# Patient Record
Sex: Male | Born: 1945
Health system: Southern US, Community
[De-identification: ages and names within clinical notes are randomized; demographics above are authoritative.]

## PROBLEM LIST (undated history)

## (undated) DIAGNOSIS — K219 Gastro-esophageal reflux disease without esophagitis: Secondary | ICD-10-CM

## (undated) DIAGNOSIS — E7800 Pure hypercholesterolemia, unspecified: Secondary | ICD-10-CM

## (undated) DIAGNOSIS — C61 Malignant neoplasm of prostate: Secondary | ICD-10-CM

## (undated) DIAGNOSIS — Z720 Tobacco use: Secondary | ICD-10-CM

## (undated) DIAGNOSIS — Z923 Personal history of irradiation: Secondary | ICD-10-CM

## (undated) DIAGNOSIS — L570 Actinic keratosis: Secondary | ICD-10-CM

## (undated) DIAGNOSIS — N402 Nodular prostate without lower urinary tract symptoms: Secondary | ICD-10-CM

## (undated) DIAGNOSIS — M545 Low back pain, unspecified: Secondary | ICD-10-CM

## (undated) DIAGNOSIS — H9190 Unspecified hearing loss, unspecified ear: Secondary | ICD-10-CM

## (undated) DIAGNOSIS — C439 Malignant melanoma of skin, unspecified: Secondary | ICD-10-CM

## (undated) DIAGNOSIS — R972 Elevated prostate specific antigen [PSA]: Secondary | ICD-10-CM

## (undated) DIAGNOSIS — N400 Enlarged prostate without lower urinary tract symptoms: Secondary | ICD-10-CM

## (undated) DIAGNOSIS — E785 Hyperlipidemia, unspecified: Secondary | ICD-10-CM

## (undated) DIAGNOSIS — C4490 Unspecified malignant neoplasm of skin, unspecified: Secondary | ICD-10-CM

## (undated) DIAGNOSIS — IMO0001 Reserved for inherently not codable concepts without codable children: Secondary | ICD-10-CM

## (undated) DIAGNOSIS — G8929 Other chronic pain: Secondary | ICD-10-CM

## (undated) DIAGNOSIS — M1990 Unspecified osteoarthritis, unspecified site: Secondary | ICD-10-CM

## (undated) DIAGNOSIS — H9319 Tinnitus, unspecified ear: Secondary | ICD-10-CM

## (undated) HISTORY — DX: Unspecified malignant neoplasm of skin, unspecified: C44.90

## (undated) HISTORY — DX: Other chronic pain: G89.29

## (undated) HISTORY — PX: SHOULDER OPEN ROTATOR CUFF REPAIR: SHX2407

## (undated) HISTORY — DX: Malignant melanoma of skin, unspecified: C43.9

## (undated) HISTORY — DX: Pure hypercholesterolemia, unspecified: E78.00

## (undated) HISTORY — DX: Tobacco use: Z72.0

## (undated) HISTORY — DX: Personal history of irradiation: Z92.3

## (undated) HISTORY — PX: TONSILLECTOMY: SUR1361

## (undated) HISTORY — PX: APPENDECTOMY: SHX54

## (undated) HISTORY — DX: Unspecified osteoarthritis, unspecified site: M19.90

## (undated) HISTORY — DX: Elevated prostate specific antigen (PSA): R97.20

## (undated) HISTORY — DX: Low back pain, unspecified: M54.50

## (undated) HISTORY — DX: Low back pain: M54.5

## (undated) HISTORY — DX: Tinnitus, unspecified ear: H93.19

## (undated) HISTORY — DX: Unspecified hearing loss, unspecified ear: H91.90

## (undated) HISTORY — DX: Benign prostatic hyperplasia without lower urinary tract symptoms: N40.0

## (undated) HISTORY — DX: Actinic keratosis: L57.0

---

## 1998-08-04 ENCOUNTER — Other Ambulatory Visit: Admission: RE | Admit: 1998-08-04 | Discharge: 1998-08-04 | Payer: Self-pay | Admitting: Urology

## 1998-12-30 ENCOUNTER — Encounter: Payer: Self-pay | Admitting: Neurosurgery

## 1998-12-30 ENCOUNTER — Ambulatory Visit (HOSPITAL_COMMUNITY): Admission: RE | Admit: 1998-12-30 | Discharge: 1998-12-30 | Payer: Self-pay | Admitting: Neurosurgery

## 1999-02-25 ENCOUNTER — Ambulatory Visit (HOSPITAL_COMMUNITY): Admission: RE | Admit: 1999-02-25 | Discharge: 1999-02-25 | Payer: Self-pay | Admitting: Neurosurgery

## 1999-02-25 ENCOUNTER — Encounter: Payer: Self-pay | Admitting: Neurosurgery

## 2000-09-14 ENCOUNTER — Other Ambulatory Visit: Admission: RE | Admit: 2000-09-14 | Discharge: 2000-09-14 | Payer: Self-pay | Admitting: Urology

## 2001-07-31 ENCOUNTER — Encounter: Payer: Self-pay | Admitting: Family Medicine

## 2001-07-31 ENCOUNTER — Ambulatory Visit (HOSPITAL_COMMUNITY): Admission: RE | Admit: 2001-07-31 | Discharge: 2001-07-31 | Payer: Self-pay | Admitting: Family Medicine

## 2002-03-22 ENCOUNTER — Ambulatory Visit (HOSPITAL_COMMUNITY): Admission: RE | Admit: 2002-03-22 | Discharge: 2002-03-22 | Payer: Self-pay | Admitting: Gastroenterology

## 2005-12-21 ENCOUNTER — Ambulatory Visit (HOSPITAL_BASED_OUTPATIENT_CLINIC_OR_DEPARTMENT_OTHER): Admission: RE | Admit: 2005-12-21 | Discharge: 2005-12-21 | Payer: Self-pay | Admitting: Orthopedic Surgery

## 2005-12-21 HISTORY — PX: CARPOMETACARPAL JOINT ARTHROTOMY: SUR102

## 2006-03-22 ENCOUNTER — Ambulatory Visit (HOSPITAL_BASED_OUTPATIENT_CLINIC_OR_DEPARTMENT_OTHER): Admission: RE | Admit: 2006-03-22 | Discharge: 2006-03-22 | Payer: Self-pay | Admitting: Orthopedic Surgery

## 2006-03-22 HISTORY — PX: CARPOMETACARPAL JOINT ARTHROTOMY: SUR102

## 2008-05-09 DIAGNOSIS — IMO0001 Reserved for inherently not codable concepts without codable children: Secondary | ICD-10-CM

## 2008-05-09 HISTORY — DX: Reserved for inherently not codable concepts without codable children: IMO0001

## 2010-07-12 HISTORY — PX: MELANOMA EXCISION: SHX5266

## 2010-07-22 ENCOUNTER — Other Ambulatory Visit: Payer: Self-pay | Admitting: Specialist

## 2010-07-22 ENCOUNTER — Encounter (HOSPITAL_COMMUNITY): Payer: Medicare Other

## 2010-07-22 ENCOUNTER — Other Ambulatory Visit (HOSPITAL_COMMUNITY): Payer: Self-pay | Admitting: Specialist

## 2010-07-22 ENCOUNTER — Ambulatory Visit (HOSPITAL_COMMUNITY)
Admission: RE | Admit: 2010-07-22 | Discharge: 2010-07-22 | Disposition: A | Discharge status: Home / Self Care | Payer: Medicare Other | Source: Ambulatory Visit | Attending: Specialist | Admitting: Specialist

## 2010-07-22 DIAGNOSIS — Z01818 Encounter for other preprocedural examination: Secondary | ICD-10-CM | POA: Insufficient documentation

## 2010-07-22 DIAGNOSIS — M75100 Unspecified rotator cuff tear or rupture of unspecified shoulder, not specified as traumatic: Secondary | ICD-10-CM

## 2010-07-22 DIAGNOSIS — X58XXXA Exposure to other specified factors, initial encounter: Secondary | ICD-10-CM | POA: Insufficient documentation

## 2010-07-22 DIAGNOSIS — Z01812 Encounter for preprocedural laboratory examination: Secondary | ICD-10-CM | POA: Insufficient documentation

## 2010-07-22 DIAGNOSIS — S43429A Sprain of unspecified rotator cuff capsule, initial encounter: Secondary | ICD-10-CM | POA: Insufficient documentation

## 2010-07-22 DIAGNOSIS — Z01811 Encounter for preprocedural respiratory examination: Secondary | ICD-10-CM | POA: Insufficient documentation

## 2010-07-22 LAB — CBC
HCT: 44.2 % (ref 39.0–52.0)
Hemoglobin: 14.6 g/dL (ref 13.0–17.0)
MCH: 30.3 pg (ref 26.0–34.0)
MCHC: 33 g/dL (ref 30.0–36.0)
MCV: 91.7 fL (ref 78.0–100.0)
Platelets: 241 10*3/uL (ref 150–400)
RBC: 4.82 MIL/uL (ref 4.22–5.81)
RDW: 12 % (ref 11.5–15.5)
WBC: 7.4 10*3/uL (ref 4.0–10.5)

## 2010-07-22 LAB — SURGICAL PCR SCREEN
MRSA, PCR: NEGATIVE
Staphylococcus aureus: POSITIVE — AB

## 2010-07-22 LAB — BASIC METABOLIC PANEL
BUN: 15 mg/dL (ref 6–23)
CO2: 29 mEq/L (ref 19–32)
Calcium: 10.1 mg/dL (ref 8.4–10.5)
Chloride: 104 mEq/L (ref 96–112)
Creatinine, Ser: 1.01 mg/dL (ref 0.4–1.5)
GFR calc Af Amer: >60 mL/min (ref >60)
GFR calc non Af Amer: >60 mL/min (ref >60)
Glucose, Bld: 170 mg/dL — ABNORMAL HIGH (ref 70–99)
Potassium: 4.8 mEq/L (ref 3.5–5.1)
Sodium: 140 mEq/L (ref 135–145)

## 2010-07-29 ENCOUNTER — Ambulatory Visit (HOSPITAL_COMMUNITY)
Admission: RE | Admit: 2010-07-29 | Discharge: 2010-07-31 | Disposition: A | Discharge status: Home / Self Care | Payer: Medicare Other | Source: Ambulatory Visit | Attending: Specialist | Admitting: Specialist

## 2010-07-29 DIAGNOSIS — E119 Type 2 diabetes mellitus without complications: Secondary | ICD-10-CM | POA: Insufficient documentation

## 2010-07-29 DIAGNOSIS — Z01812 Encounter for preprocedural laboratory examination: Secondary | ICD-10-CM | POA: Insufficient documentation

## 2010-07-29 DIAGNOSIS — Z01811 Encounter for preprocedural respiratory examination: Secondary | ICD-10-CM | POA: Insufficient documentation

## 2010-07-29 DIAGNOSIS — M67919 Unspecified disorder of synovium and tendon, unspecified shoulder: Secondary | ICD-10-CM | POA: Insufficient documentation

## 2010-07-29 DIAGNOSIS — M719 Bursopathy, unspecified: Secondary | ICD-10-CM | POA: Insufficient documentation

## 2010-07-29 LAB — GLUCOSE, CAPILLARY: Glucose-Capillary: 173 mg/dL — ABNORMAL HIGH (ref 70–99)

## 2010-08-05 NOTE — Op Note (Signed)
NAME:  Frank Larsen, Frank Larsen               ACCOUNT NO.:  [id number]  MEDICAL RECORD NO.:  [id number]           PATIENT TYPE:  O  LOCATION:  1526                         FACILITY:  [facility]  PHYSICIAN:  Jene Every, M.D.    DATE OF BIRTH:  November 05, 1945  DATE OF PROCEDURE: DATE OF DISCHARGE:                              OPERATIVE REPORT   PREOPERATIVE DIAGNOSES:  Rotator cuff tear of the right shoulder, supraspinatus full thickness, and impingement syndrome.  POSTOPERATIVE DIAGNOSIS:  Massive tear of the rotator cuff.  PROCEDURE PERFORMED: 1. Repair of massive tear of the rotator cuff with Mitek suture     anchors and push lock anchors with a double row fixation technique. 2. Subacromial decompression acromioplasty.  ANESTHESIA:  General.  SURGEON:  Jene Every, M.D.  ASSISTANT:  Roma Schanz, P.A.  ESTIMATED BLOOD LOSS:  Minimal.  BRIEF HISTORY:  This is a 65 year old male who in December was diagnosed with a full-thickness tear of the rotator cuff and recommended rotator cuff repair.  The patient wanted to wait until he became Medicare eligible in terms of finances.  I indicated that this could develop in irrepairable tear with suboptimal repair due to delay, he chose the delay. We discussed the risks and benefits including bleeding, infection, damage to neurovascular structures, no change in symptoms or worsening symptoms, arthrofibrosis, adhesive capsulitis, suboptimal range of motion, prolonged recovery, DVT, PE, anesthetic complications, etc.  TECHNIQUE:  With the patient in supine beach-chair position after induction of adequate anesthesia and 1 g of vancomycin.  The surgical time delayed to allow minimum of the half dose of the vancomycin to infuse.  She surgical marker was utilized to delineate the acromion and the coracoid after the sterile prep and drape was performed.  Small incision was made over the anterolateral aspect the acromion. Subcutaneous tissue was  dissected. Electrocautery was utilized to achieve hemostasis.  The raphe between the anterolateral heads of deltoid was identified and divided in line of the skin incision which to 7 cm.  Divided that and subperiosteally elevated the deltoid from the anterolateral and anteromedial aspect of the acromion attaching the CA ligament.  I digitally lysed adhesions and removed a small spur off the anterolateral aspect acromion with 3-mm Kerrison.  Digitally lysed adhesions.  There was a hypertrophic bursa and a massive tear of the rotator cuff.  It had extended from the condition in December.  It was retracted.  The infraspinatus and supraspinatus was involved and retracted posteriorly and laterally.  There was tearing of the infraspinatus, the subscap and just medial to the of the biceps tendon. Biceps tendon appeared to be intact, though frayed.  We spent considerable mobilizing the rotator cuff in the subacromial space as well as beneath the rotator cuff and the glenoid labrum.  We did not mobilize this medial to the glenoid, however.  We mobilized tissue and found that we were able to advance to the anterolateral foot print of the supraspinatus bringing the infraspinatus with it.  This was just to the lateral border of the bicipital groove.  Would not advance beyond that.  We then performed a bony trough  utilizing of the [facility] rongeur just lateral to the articular surface and removed a bony prominence off the greater tuberosity.  We advanced in the above-mentioned fashion and placed a Mitek anchor just lateral to the articular surface draining it through the tendon, hold in tension to advance the cuff.  This was then threaded and secured with a surgical knot.  A second Mitek suture anchor was placed just lateral to the bicipital groove and again threaded through the tendon and securing that position with the surgical knot. The arm was held in a slightly externally rotated position for  that. After threading, the suture ends were crossed, brought over the lateral aspect of the greater tuberosity and secured with 2 push lock anchors after appropriate utilization of an awl threading through the push lock device and inserting with slight abduction.  This secured and covered the footplate up to the bicipital groove.  The advancing edge of the supraspinatus and subscap was then further secured with a third Mitek suture anchor just lateral to the bicipital groove threading through the supraspinatus and the subscap as well as incorporating part of the biceps tendon which I felt was required to fully cover the footplate to perform a watertight closure and secured the remainder of the rotator cuff.  The advancement was approximately 1 to 1.5 cm.  Wound was copiously irrigated.  Good surgical knot was applied here. We had lysed adhesions digitally.  Full inspection revealed actually we obtained a full coverage.  No significant tension was noted on the repair site. Therefore, I copiously irrigated the wound.  Marcaine with epinephrine was infiltrated at the subacromial space.  Repaired the raphe with #1 Vicryl figure-of-eight sutures over top through the acromion, subcu with 2-0 Vicryl simple sutures.  Skin was reapproximated with 4-0 subcuticular Prolene.  Wound reinforced with Steri-Strips.  Sterile dressing applied.  Placed an abduction pillow, extubated and transferred to recovery in satisfactory condition.  The patient tolerated the procedure well with no complications.     Jene Every, M.D.     Cordelia Pen  D:  07/29/2010  T:  07/29/2010  Job:  [id number]  Electronically Signed by Jene Every M.D. on 08/05/2010 07:23:27 AM

## 2011-06-29 ENCOUNTER — Other Ambulatory Visit: Payer: Self-pay | Admitting: Urology

## 2011-07-14 ENCOUNTER — Encounter (HOSPITAL_BASED_OUTPATIENT_CLINIC_OR_DEPARTMENT_OTHER): Payer: Self-pay | Admitting: *Deleted

## 2011-07-15 ENCOUNTER — Encounter (HOSPITAL_BASED_OUTPATIENT_CLINIC_OR_DEPARTMENT_OTHER): Payer: Self-pay | Admitting: *Deleted

## 2011-07-15 NOTE — Progress Notes (Signed)
NPO AFTER MN. ARRIVES AT 0815. NEEDS HG AND EKG.  WILL TAKE PRILOSEC AM OF SURG W/ SIP OF WATER. AND DO FLEET ENEMA.

## 2011-07-20 ENCOUNTER — Encounter (HOSPITAL_BASED_OUTPATIENT_CLINIC_OR_DEPARTMENT_OTHER): Payer: Self-pay | Admitting: Anesthesiology

## 2011-07-20 ENCOUNTER — Ambulatory Visit (HOSPITAL_BASED_OUTPATIENT_CLINIC_OR_DEPARTMENT_OTHER)
Admission: RE | Admit: 2011-07-20 | Discharge: 2011-07-20 | Disposition: A | Discharge status: Home / Self Care | Payer: Medicare Other | Source: Ambulatory Visit | Attending: Urology | Admitting: Urology

## 2011-07-20 ENCOUNTER — Encounter (HOSPITAL_BASED_OUTPATIENT_CLINIC_OR_DEPARTMENT_OTHER): Payer: Self-pay | Admitting: *Deleted

## 2011-07-20 ENCOUNTER — Other Ambulatory Visit: Payer: Self-pay

## 2011-07-20 ENCOUNTER — Ambulatory Visit (HOSPITAL_BASED_OUTPATIENT_CLINIC_OR_DEPARTMENT_OTHER): Payer: Medicare Other | Admitting: Anesthesiology

## 2011-07-20 ENCOUNTER — Encounter (HOSPITAL_BASED_OUTPATIENT_CLINIC_OR_DEPARTMENT_OTHER)
Admission: RE | Disposition: A | Discharge status: Home / Self Care | Payer: Self-pay | Source: Ambulatory Visit | Attending: Urology

## 2011-07-20 DIAGNOSIS — K219 Gastro-esophageal reflux disease without esophagitis: Secondary | ICD-10-CM | POA: Insufficient documentation

## 2011-07-20 DIAGNOSIS — E119 Type 2 diabetes mellitus without complications: Secondary | ICD-10-CM | POA: Insufficient documentation

## 2011-07-20 DIAGNOSIS — Z01810 Encounter for preprocedural cardiovascular examination: Secondary | ICD-10-CM | POA: Insufficient documentation

## 2011-07-20 DIAGNOSIS — R972 Elevated prostate specific antigen [PSA]: Secondary | ICD-10-CM | POA: Insufficient documentation

## 2011-07-20 DIAGNOSIS — E785 Hyperlipidemia, unspecified: Secondary | ICD-10-CM | POA: Insufficient documentation

## 2011-07-20 DIAGNOSIS — N402 Nodular prostate without lower urinary tract symptoms: Secondary | ICD-10-CM | POA: Insufficient documentation

## 2011-07-20 DIAGNOSIS — C61 Malignant neoplasm of prostate: Secondary | ICD-10-CM

## 2011-07-20 HISTORY — DX: Hyperlipidemia, unspecified: E78.5

## 2011-07-20 HISTORY — DX: Gastro-esophageal reflux disease without esophagitis: K21.9

## 2011-07-20 HISTORY — DX: Nodular prostate without lower urinary tract symptoms: N40.2

## 2011-07-20 HISTORY — PX: PROSTATE BIOPSY: SHX241

## 2011-07-20 HISTORY — DX: Malignant neoplasm of prostate: C61

## 2011-07-20 HISTORY — DX: Reserved for inherently not codable concepts without codable children: IMO0001

## 2011-07-20 HISTORY — DX: Unspecified hearing loss, unspecified ear: H91.90

## 2011-07-20 LAB — POCT I-STAT 4, (NA,K, GLUC, HGB,HCT)
HCT: 41 % (ref 39.0–52.0)
Hemoglobin: 13.9 g/dL (ref 13.0–17.0)
Sodium: 143 mEq/L (ref 135–145)

## 2011-07-20 SURGERY — BIOPSY, PROSTATE, RECTAL APPROACH, WITH US GUIDANCE
Anesthesia: Monitor Anesthesia Care | Site: Prostate | Wound class: Clean Contaminated

## 2011-07-20 MED ORDER — FENTANYL CITRATE 0.05 MG/ML IJ SOLN
INTRAMUSCULAR | Status: DC | PRN
  Administered 2011-07-20: 100 ug via INTRAVENOUS

## 2011-07-20 MED ORDER — MIDAZOLAM HCL 5 MG/5ML IJ SOLN
INTRAMUSCULAR | Status: DC | PRN
  Administered 2011-07-20: 2 mg via INTRAVENOUS

## 2011-07-20 MED ORDER — PROPOFOL 10 MG/ML IV EMUL
INTRAVENOUS | Status: DC | PRN
  Administered 2011-07-20: 75 ug/kg/min via INTRAVENOUS

## 2011-07-20 MED ORDER — PROPOFOL 10 MG/ML IV EMUL
INTRAVENOUS | Status: DC | PRN
  Administered 2011-07-20: 30 mg via INTRAVENOUS

## 2011-07-20 MED ORDER — PROMETHAZINE HCL 25 MG/ML IJ SOLN: 6.2500 mg | INTRAMUSCULAR | Status: DC | PRN

## 2011-07-20 MED ORDER — GENTAMICIN IN SALINE 1.6-0.9 MG/ML-% IV SOLN
80.0000 mg | INTRAVENOUS | Status: AC
  Administered 2011-07-20: 80 mg via INTRAVENOUS

## 2011-07-20 MED ORDER — FENTANYL CITRATE 0.05 MG/ML IJ SOLN: 25.0000 ug | INTRAMUSCULAR | Status: DC | PRN

## 2011-07-20 MED ORDER — BUPIVACAINE HCL (PF) 0.25 % IJ SOLN
INTRAMUSCULAR | Status: DC | PRN
  Administered 2011-07-20: 20 mL

## 2011-07-20 MED ORDER — ONDANSETRON HCL 4 MG/2ML IJ SOLN
INTRAMUSCULAR | Status: DC | PRN
  Administered 2011-07-20: 4 mg via INTRAVENOUS

## 2011-07-20 MED ORDER — LACTATED RINGERS IV SOLN
INTRAVENOUS | Status: DC
  Administered 2011-07-20: 09:00:00 via INTRAVENOUS

## 2011-07-20 SURGICAL SUPPLY — 13 items
BAG URINE DRAINAGE (UROLOGICAL SUPPLIES) ×1 IMPLANT
CATH FOLEY 2WAY SLVR  5CC 16FR (CATHETERS) ×1
CATH FOLEY 2WAY SLVR 5CC 16FR (CATHETERS) IMPLANT
DRESSING TELFA 8X3 (GAUZE/BANDAGES/DRESSINGS) ×2 IMPLANT
GLOVE ECLIPSE 7.0 STRL STRAW (GLOVE) IMPLANT
GLOVE INDICATOR 7.5 STRL GRN (GLOVE) IMPLANT
IV NS IRRIG 3000ML ARTHROMATIC (IV SOLUTION) IMPLANT
SURGILUBE 2OZ TUBE FLIPTOP (MISCELLANEOUS) ×2 IMPLANT
SYRINGE 10CC LL (SYRINGE) ×1 IMPLANT
TOWEL OR 17X24 6PK STRL BLUE (TOWEL DISPOSABLE) ×2 IMPLANT
UNDERPAD 30X30 INCONTINENT (UNDERPADS AND DIAPERS) ×2 IMPLANT
WATER STERILE IRR 3000ML UROMA (IV SOLUTION) IMPLANT
WATER STERILE IRR 500ML POUR (IV SOLUTION) ×2 IMPLANT

## 2011-07-20 NOTE — Transfer of Care (Signed)
Immediate Anesthesia Transfer of Care Note  Patient: Frank Larsen  Procedure(s) Performed: Procedure(s) (LRB): BIOPSY TRANSRECTAL ULTRASONIC PROSTATE (TUBP) (N/A)  Patient Location: PACU  Anesthesia Type: MAC  Level of Consciousness: awake, alert  and oriented  Airway & Oxygen Therapy: Patient Spontanous Breathing and Patient connected to nasal cannula oxygen  Post-op Assessment: Report given to PACU RN  Post vital signs: Reviewed and stable  Complications: No apparent anesthesia complications

## 2011-07-20 NOTE — H&P (Signed)
Urology History and Physical Exam  CC: Prostate nodule, elevated PSA  HPI:   Frank Larsen presents today for saturation prostate biopsy. He has a very large prostate and an elevated PSA. His most recent psa value was 2.49 (4.98 corrected).  He has had 5 negative previous prostate biopsies of the prostate.  He also has a prostate nodule located right of the median sulcus that is 8 mm in size.  We discussed the risks, benefits, alternatives, and likelihood of achieving goals.  PMH: Past Medical History  Diagnosis Date  . Normal cardiac stress test 2010  . Prostate nodule   . Diabetes mellitus ORAL MED  . Hyperlipidemia   . GERD (gastroesophageal reflux disease)   . Impaired hearing BILATERAL HEARING AIDS    20% RIGHT HEARING DUE TO VIRUS    PSH: Past Surgical History  Procedure Date  . Shoulder open rotator cuff repair 07-29-2010- RIGHT    AND SUBACROMIAL DECOMPRESSION AROMIOPLASTY  . Carpometacarpal joint arthrotomy 03-22-2006    RIGHT THUMB  . Carpometacarpal joint arthrotomy 12-21-2005    LEFT THUMB  . Melanoma excision 07-12-10    RIGHT NECK  . Tonsillectomy AGE 66  . Appendectomy AGE 36    Allergies: Allergies  Allergen Reactions  . Keflex Hives    Medications: No prescriptions prior to admission     Social History: History   Social History  . Marital Status: Married    Spouse Name: N/A    Number of Children: N/A  . Years of Education: N/A   Occupational History  . Not on file.   Social History Main Topics  . Smoking status: Former Smoker -- 1.0 packs/day for 43 years    Types: Cigarettes    Quit date: 07/15/2007  . Smokeless tobacco: Never Used  . Alcohol Use: Not on file     OCCASIONAL  . Drug Use: No  . Sexually Active:    Other Topics Concern  . Not on file   Social History Narrative  . No narrative on file    Family History: History reviewed. No pertinent family history.  Review of Systems: Positive: Decreased hearing acuity. Negative:  Chest pain, SOB, fever.  A further 10 point review of systems was negative except what is listed in the HPI.  Physical Exam:  General: No acute distress.  Awake. Head:  Normocephalic.  Atraumatic. ENT:  EOMI.  Mucous membranes moist Neck:  Supple.  No lymphadenopathy. CV:  S1 present. S2 present. Regular rate. Pulmonary: Equal effort bilaterally.  Clear to auscultation bilaterally. Abdomen: Soft.  Non- tender to palpation. Skin:  Normal turgor.  No visible rash. Extremity: No gross deformity of bilateral upper extremities.  No gross deformity of    bilateral lower extremities. Neurologic: Alert. Appropriate mood.   Studies:  No results found for this basename: HGB:2,WBC:2,PLT:2 in the last 72 hours  No results found for this basename: NA:2,K:2,CL:2,CO2:2,BUN:2,CREATININE:2,CALCIUM:2,MAGNESIUM:2,GFRNONAA:2,GFRAA:2 in the last 72 hours   No results found for this basename: PT:2,INR:2,APTT:2 in the last 72 hours   No components found with this basename: ABG:2    Assessment:  Elevated PSA, prostate nodule  Plan: -To the OR for transrectal US guided saturation biopsy of the prostate with prostatic block.

## 2011-07-20 NOTE — Brief Op Note (Signed)
07/20/2011  10:04 AM  PATIENT:  Frank Larsen  66 y.o. male  PRE-OPERATIVE DIAGNOSIS:  Prostate Nodule, Elevated PSA  POST-OPERATIVE DIAGNOSIS:  Prostate Nodule, Elevated PSA  PROCEDURE:  Procedure(s) (LRB): Transrectal prostate ultrasound Saturation prostate biopsy Bilateral prostatic nerve block  SURGEON:  Surgeon(s) and Role:    * Milford Cage, MD - Primary  PHYSICIAN ASSISTANT:   ASSISTANTS: none   ANESTHESIA:   IV sedation  EBL:     BLOOD ADMINISTERED:none  DRAINS: Urinary Catheter (Foley) Placed and removed in OR.  LOCAL MEDICATIONS USED:  MARCAINE    and Amount: 10 ml  SPECIMEN:  Source of Specimen:  prostate  DISPOSITION OF SPECIMEN:  PATHOLOGY  COUNTS:  YES  TOURNIQUET:  * No tourniquets in log *  DICTATION: .Other Dictation: Dictation Number [id number]  PLAN OF CARE: Discharge to home after PACU  PATIENT DISPOSITION:  PACU - hemodynamically stable.   Delay start of Pharmacological VTE agent (>24hrs) due to surgical blood loss or risk of bleeding: yes

## 2011-07-20 NOTE — Anesthesia Postprocedure Evaluation (Signed)
  Anesthesia Post-op Note  Patient: Frank Larsen  Procedure(s) Performed: Procedure(s) (LRB): BIOPSY TRANSRECTAL ULTRASONIC PROSTATE (TUBP) (N/A)  Patient Location: PACU  Anesthesia Type: MAC  Level of Consciousness: awake and alert   Airway and Oxygen Therapy: Patient Spontanous Breathing  Post-op Pain: mild  Post-op Assessment: Post-op Vital signs reviewed, Patient's Cardiovascular Status Stable, Respiratory Function Stable, Patent Airway and No signs of Nausea or vomiting  Post-op Vital Signs: stable  Complications: No apparent anesthesia complications

## 2011-07-20 NOTE — Discharge Instructions (Addendum)
DISCHARGE INSTRUCTIONS FOR TURP  MEDICATIONS:  1. DO NOT RESUME YOUR ASPIRIN, WARFARIN, OR OTHER BLOOD THINNER FOR 1 WEEK.  2. Resume all your other meds from home, including finasteride and tamsulosin if you were taking them before.  ACTIVITY 1. No heavy lifting >10 pounds for 72 hours or longer if blood persists in urine/bowel movements. 2. No sexual activity for 72 hours 3. No strenuous activity for 72 hours 4. Your urine may have some blood in it - make sure you drink plenty of water,  call or come to the ER immediately if you can't urinate at all 5. No straddle activity (such as riding a bike or a horse)for 1 week. 6. If you have worsened bleeding, sit for 10 minutes on a rolled up towel.  BATHING 1. You can shower. Do not take baths or submerge the catheter underwater.  CATHETER (If you are discharged with a catheter.) 1. Keep your catheter secured to your leg at all times with tape. 2. You may experience leakage of urine around your catheter- as long as the  catheter continues to drain, this is normal.  If your catheter stops draining  return to the ER, but do not let anyone other than a urologist replace your  catheter. 3. You may also have blood in your urine, even after it has been clear for  several days; you may even pass some small blood clots or other material.  This  is normal as well.  If this happens, sit down and drink plenty of water to help  make urine to flush out your bladder.  If the blood in your urine becomes worse  after doing this, contact our office or return to the ER. 4. You may use the leg bag (small bag) during the day, but use the large bag at  night.    SIGNS/SYMPTOMS TO CALL: 1. Please call us if you have a fever greater than 101.5, uncontrolled  nausea/vomiting, uncontrolled pain, dizziness, unable to urinate, chest pain, shortness of breath, leg swelling, leg pain, or any other concerns  or questions.  You can reach [place] at  [phone].  FOLLOW-UP 1.  You will be based upon biopsy results.  I will contact you when I receive the results.

## 2011-07-20 NOTE — Anesthesia Preprocedure Evaluation (Signed)
Anesthesia Evaluation  Patient identified by MRN, date of birth, ID band Patient awake    Reviewed: Allergy & Precautions, H&P , NPO status , Patient's Chart, lab work & pertinent test results  Airway Mallampati: II TM Distance: >3 FB Neck ROM: Full    Dental No notable dental hx.    Pulmonary former smoker 43 pk yr hx breath sounds clear to auscultation  Pulmonary exam normal       Cardiovascular negative cardio ROS  Rhythm:Regular Rate:Normal     Neuro/Psych negative neurological ROS  negative psych ROS   GI/Hepatic negative GI ROS, Neg liver ROS, GERD-  Medicated,  Endo/Other  negative endocrine ROSDiabetes mellitus-, Type 2, Oral Hypoglycemic Agents  Renal/GU negative Renal ROS  negative genitourinary   Musculoskeletal negative musculoskeletal ROS (+)   Abdominal   Peds negative pediatric ROS (+)  Hematology negative hematology ROS (+)   Anesthesia Other Findings   Reproductive/Obstetrics negative OB ROS                           Anesthesia Physical Anesthesia Plan  ASA: II  Anesthesia Plan: MAC   Post-op Pain Management:    Induction: Intravenous  Airway Management Planned: Simple Face Mask  Additional Equipment:   Intra-op Plan:   Post-operative Plan:   Informed Consent: I have reviewed the patients History and Physical, chart, labs and discussed the procedure including the risks, benefits and alternatives for the proposed anesthesia with the patient or authorized representative who has indicated his/her understanding and acceptance.   Dental advisory given  Plan Discussed with: CRNA  Anesthesia Plan Comments:         Anesthesia Quick Evaluation

## 2011-07-21 ENCOUNTER — Encounter (HOSPITAL_BASED_OUTPATIENT_CLINIC_OR_DEPARTMENT_OTHER): Payer: Self-pay | Admitting: Urology

## 2011-07-21 NOTE — Op Note (Signed)
NAME:  Frank Larsen, Frank Larsen NO.:  [id number]  MEDICAL RECORD NO.:  [id number]  LOCATION:                               FACILITY:  [facility]  PHYSICIAN:  Natalia Leatherwood, MD    DATE OF BIRTH:  [birth year]  DATE OF PROCEDURE:  07/20/2011 DATE OF DISCHARGE:                              OPERATIVE REPORT   SURGEON:  Natalia Leatherwood, MD.  PREOPERATIVE DIAGNOSIS:  Elevated prostate-specific antigen and prostate nodule.  POSTOPERATIVE DIAGNOSIS:  Elevated prostate-specific antigen and prostate nodule.  PROCEDURE PERFORMED: 1. Transrectal ultrasound of the prostate. 2. Prostate saturation biopsy. 3. Prostatic block bilaterally.  FINDINGS:  Minimal calcifications in the prostate, right prostatic nodule.  SPECIMEN:  Prostate biopsy was sent for a specimen, 36 total biopsies were taken.  COMPLICATIONS:  None.  ESTIMATED BLOOD LOSS:  Minimal.  DRAINS:  Foley catheter was placed during the procedure, but removed in the operating room before the patient was taken to PACU.  HISTORY OF PRESENT ILLNESS:  This is a 66 year old gentleman who has a history of a large prostate and elevated PSA.  On his last visit to clinic, his PSA had remained elevated and he also was discovered to have a right-sided prostate nodule.  Due to 5 previous biopsies, I had recommended a saturation prostate biopsy under monitored anesthesia care.  The patient agreed and presents today for that procedure.  DESCRIPTION OF PROCEDURE:  Informed consent was obtained.  The patient was taken to the operating room, where he was placed in supine position. Monitored anesthesia care was induced with IV sedation and he was placed in a lateral decubitus position with his right side up.  His hips and knees were flexed, and then a rectal examination was performed.  The prostate nodule was noted to be close to the median sulcus.  Next, a 45- [place] Foley catheter was placed using a sterile technique and 10 cc  of sterile water were placed into the balloon.  After this was done, the prostate probe was introduced and prostatic block was performed bilaterally injecting 5 cc of 0.25% plain Marcaine bilaterally where the seminal vesicle met the base of the prostate.  After this was complete, prostate biopsy was carried out with a total of 36 biopsies of the prostate making sure to take adequate samples of the apex, extreme lateral peripheral zone, base, and transitional and central zones. After this was done, measurements of the prostate were performed.  This revealed a prostatic length of 6.21 cm, prostatic height of 4.49 cm, and a prostate width of 6.35 cm.  The total prostate volume was 92.62 cc. After this, the probe was removed and a rolled up towel was placed on the patient's perineum and direct pressure was held for 10 minutes.  The Foley catheter was removed after 5 minutes of pressure.  After the pressure, the patient was placed back in supine position, and taken back to the PACU in a stable condition. He will be contacted with results of the biopsy and followup will be based upon this.  He will complete his antibiotics as an outpatient.          ______________________________ Natalia Leatherwood, MD  DW/MEDQ  D:  07/20/2011  T:  07/21/2011  Job:  [id number]

## 2011-07-25 ENCOUNTER — Other Ambulatory Visit (HOSPITAL_COMMUNITY): Payer: Self-pay | Admitting: Urology

## 2011-07-25 DIAGNOSIS — C61 Malignant neoplasm of prostate: Secondary | ICD-10-CM

## 2011-07-26 ENCOUNTER — Other Ambulatory Visit: Payer: Self-pay | Admitting: Urology

## 2011-08-10 ENCOUNTER — Other Ambulatory Visit (HOSPITAL_COMMUNITY): Payer: Self-pay | Admitting: Urology

## 2011-08-10 ENCOUNTER — Ambulatory Visit (HOSPITAL_COMMUNITY)
Admission: RE | Admit: 2011-08-10 | Discharge: 2011-08-10 | Disposition: A | Discharge status: Home / Self Care | Payer: Medicare Other | Source: Ambulatory Visit | Attending: Urology | Admitting: Urology

## 2011-08-10 DIAGNOSIS — C61 Malignant neoplasm of prostate: Secondary | ICD-10-CM

## 2011-08-11 ENCOUNTER — Ambulatory Visit (HOSPITAL_COMMUNITY): Payer: Medicare Other

## 2011-08-11 ENCOUNTER — Encounter (HOSPITAL_COMMUNITY)
Admission: RE | Admit: 2011-08-11 | Discharge: 2011-08-11 | Disposition: A | Discharge status: Home / Self Care | Payer: Medicare Other | Source: Ambulatory Visit | Attending: Urology | Admitting: Urology

## 2011-08-11 ENCOUNTER — Encounter (HOSPITAL_COMMUNITY): Payer: Medicare Other

## 2011-08-11 ENCOUNTER — Encounter (HOSPITAL_COMMUNITY): Payer: Self-pay

## 2011-08-11 DIAGNOSIS — C61 Malignant neoplasm of prostate: Secondary | ICD-10-CM | POA: Insufficient documentation

## 2011-08-11 MED ORDER — TECHNETIUM TC 99M MEDRONATE IV KIT
24.0000 | PACK | Freq: Once | INTRAVENOUS | Status: AC | PRN
  Administered 2011-08-11: 24 via INTRAVENOUS

## 2011-08-16 ENCOUNTER — Ambulatory Visit: Payer: Medicare Other

## 2011-08-16 ENCOUNTER — Ambulatory Visit: Payer: Medicare Other | Admitting: Radiation Oncology

## 2011-08-17 ENCOUNTER — Encounter: Payer: Self-pay | Admitting: Radiation Oncology

## 2011-08-17 DIAGNOSIS — C61 Malignant neoplasm of prostate: Secondary | ICD-10-CM | POA: Insufficient documentation

## 2011-08-17 NOTE — Progress Notes (Signed)
66 year old male. Married. 1 son and 1 daughter.   Pre-biopsy PSA 3.6 TRUS prostate volume 92 cc  07/20/11 prostate biopsy (36 total biopsies taken) revealed adenocarcinoma of the prostate. 5+4=9, 5 cores, 40-90% 4.5=9, 1 core, 70% 5+3=8, 3 cores, 30-50% 3+5=8, 3 cores, 50% 3+3=6, 1 core, 50%    [id number] No hx of radiation therapy No indication of a pacemaker

## 2011-08-18 ENCOUNTER — Encounter: Payer: Self-pay | Admitting: Radiation Oncology

## 2011-08-18 ENCOUNTER — Ambulatory Visit: Payer: Medicare Other | Admitting: Radiation Oncology

## 2011-08-18 ENCOUNTER — Ambulatory Visit: Payer: Medicare Other

## 2011-08-18 ENCOUNTER — Ambulatory Visit
Admission: RE | Admit: 2011-08-18 | Discharge: 2011-08-18 | Disposition: A | Discharge status: Home / Self Care | Payer: Medicare Other | Source: Ambulatory Visit | Attending: Radiation Oncology | Admitting: Radiation Oncology

## 2011-08-18 VITALS — BP 127/82 | HR 63 | Temp 97.7°F | Wt 188.0 lb

## 2011-08-18 DIAGNOSIS — Z7982 Long term (current) use of aspirin: Secondary | ICD-10-CM | POA: Insufficient documentation

## 2011-08-18 DIAGNOSIS — K219 Gastro-esophageal reflux disease without esophagitis: Secondary | ICD-10-CM | POA: Insufficient documentation

## 2011-08-18 DIAGNOSIS — C61 Malignant neoplasm of prostate: Secondary | ICD-10-CM

## 2011-08-18 DIAGNOSIS — Z79899 Other long term (current) drug therapy: Secondary | ICD-10-CM | POA: Insufficient documentation

## 2011-08-18 DIAGNOSIS — E119 Type 2 diabetes mellitus without complications: Secondary | ICD-10-CM | POA: Insufficient documentation

## 2011-08-18 DIAGNOSIS — E785 Hyperlipidemia, unspecified: Secondary | ICD-10-CM | POA: Insufficient documentation

## 2011-08-18 NOTE — Progress Notes (Signed)
Please see the Nurse Progress Note in the MD Initial Consult Encounter for this patient. 

## 2011-08-18 NOTE — Progress Notes (Signed)
Patient here for radiation consultation of prostate.Retired Public librarian. IPSS 8.

## 2011-08-18 NOTE — Progress Notes (Signed)
[facility] Radiation Oncology NEW PATIENT EVALUATION  Name: Frank Larsen MRN: [id number]  Date:   08/18/2011           DOB: 06-26-1945  Status: outpatient   CC: No primary provider on file.  Milford Cage,*    REFERRING PHYSICIAN: Milford Cage,*   DIAGNOSIS: Clinical stage T2a on favorable risk adenocarcinoma prostate   HISTORY OF PRESENT ILLNESS:  Frank Larsen is a 66 y.o. male who is  seen today for the courtesy of Dr. Margarita Grizzle for evaluation of his stage T. 2A favorable risk adenocarcinoma prostate. He had high-grade PIN in April 2004 but no atypia. PSA was 8.2 at that time. His PSA has fluctuated and has been on finasteride since April 2006. His PSA in October 2010 was 6.0 and in January 2011 4.0 while on finasteride. On 05/20/2011 his PSA was 2.49 (corrected 4.96). However, Dr. Margarita Grizzle noted a 0.8 cm nodule along the right mid gland. He underwent saturation biopsies on 07/20/2011 revealing Gleason 9 (5+4) involving 80% of one core from the right lateral base, 40% of one core from the right mid gland, 70% of one core from the right base, 50% of one core from the "right nodule"and not observe one core from the left lateral base. He also had Gleason 9 (4+5) involving 70% of one core from the right base. He Gleason 8 (5+3) involving 50% of one core from the right transitional zone, 30% of one core from the right central zone, and 40% of one core from the left mid medial gland. He Gleason 8 (3+5) involving 50% of one core from the right transitional is and 50% of one core from the right nodule and 50% of one core from left lateral apex. His gland volume was approximately 92 cc. A total of 36 biopsies were obtained. His staging workup included a CT scan of the abdomen/pelvis and bone scan which understand were without evidence for metastatic disease. I have not reviewed these. His bone scan was on March 18. He is doing reasonably well from a GU and GI  standpoint. His I PSS score is 8. He does have mild erectile dysfunction but has not sexually active.  PREVIOUS RADIATION THERAPY: No   PAST MEDICAL HISTORY:  has a past medical history of Normal cardiac stress test (2010); Prostate nodule; Diabetes mellitus (ORAL MED); Hyperlipidemia; GERD (gastroesophageal reflux disease); Impaired hearing (BILATERAL HEARING AIDS); Arthritis; Skin cancer; and Elevated PSA.     PAST SURGICAL HISTORY:  Past Surgical History  Procedure Date  . Shoulder open rotator cuff repair 07-29-2010- RIGHT    AND SUBACROMIAL DECOMPRESSION AROMIOPLASTY  . Carpometacarpal joint arthrotomy 03-22-2006    RIGHT THUMB  . Carpometacarpal joint arthrotomy 12-21-2005    LEFT THUMB  . Melanoma excision 07-12-10    RIGHT NECK  . Tonsillectomy AGE 33  . Appendectomy AGE 29  . Prostate biopsy 07/20/2011    Procedure: BIOPSY TRANSRECTAL ULTRASONIC PROSTATE (TUBP);  Surgeon: Milford Cage, MD;  [facility];  Service: Urology;  Laterality: N/A;  1 hour requested for this case  Saturation BX  OFFICE TO BRING ULTRASOUND MACHINE       FAMILY HISTORY: family history includes Alzheimer's disease in his mother and Cancer in his mother.   SOCIAL HISTORY:  reports that he quit smoking about 3 years ago. His smoking use included Cigarettes. He has a 43 pack-year smoking history. He has never used smokeless tobacco. He reports that he  drinks alcohol. He reports that he does not use illicit drugs.   ALLERGIES: Keflex   MEDICATIONS:  Current Outpatient Prescriptions  Medication Sig Dispense Refill  . aspirin 325 MG tablet Take 325 mg by mouth daily.      [name] atorvastatin (LIPITOR) 10 MG tablet Take 10 mg by mouth daily.      . finasteride (PROSCAR) 5 MG tablet Take 5 mg by mouth daily.      . metFORMIN (GLUCOPHAGE) 1000 MG tablet Take 1,000 mg by mouth 2 (two) times daily with a meal.      . Multiple Vitamin (MULTIVITAMIN) tablet Take 1 tablet by  mouth daily.      [name] omeprazole (PRILOSEC) 20 MG capsule Take 20 mg by mouth every morning.      . pseudoephedrine-acetaminophen (TYLENOL SINUS) 30-500 MG TABS Take 1 tablet by mouth every 4 (four) hours as needed.         REVIEW OF SYSTEMS:  Pertinent items are noted in HPI.    PHYSICAL EXAM:  weight is 188 lb (85.276 kg). His temperature is 97.7 F (36.5 C). His blood pressure is 127/82 and his pulse is 63.   Head and neck examination: Grossly unremarkable. Nodes: Without palpable cervical or supraclavicular lymphadenopathy. Chest: Lungs clear. Back: Without spinal or CVA tenderness. Heart: Regular rate and rhythm. Abdomen: Soft, without masses organomegaly. Genitalia unremarkable to inspection. Rectal the prostate gland is moderately enlarged and there is the suggestion of nodularity along the right mid gland. There is no palpable periprostatic tumor extension. I do not feel his seminal vesicles. Extremities without edema. Neurologic examination: Grossly nonfocal.    LABORATORY DATA:  Lab Results  Component Value Date   WBC 7.4 07/22/2010   HGB 13.9 07/20/2011   HCT 41.0 07/20/2011   MCV 91.7 07/22/2010   PLT 241 07/22/2010   Lab Results  Component Value Date   NA 143 07/20/2011   K 4.0 07/20/2011   CL 104 07/22/2010   CO2 29 07/22/2010   No results found for this basename: ALT, AST, GGT, ALKPHOS, BILITOT    PSA from 06/15/2011 3.62 (corrected 7.24    IMPRESSION: Stage T 2a unfavorable risk adenocarcinoma prostate. I explained to the patient and his wife that his prognosis is related to his stage, PSA level, and Gleason score. His stage and corrected PSA level are favorable while his Gleason score of 9 is distinctly unfavorable. His management options include surgery versus radiation therapy. Radiation therapy options include 5 weeks of external beam followed by seed implantation or 8 weeks of external beam/IMRT. Based on his gland volume, he is not a candidate for a prostate seed implant  boost. By default, his radiation therapy option is IMRT. I do recommend 2 years of androgen deprivation therapy along with IMRT. He understands that he has a significant risk for occult metastatic disease despite having a negative CT scan of the abdomen/pelvis and bone scan. Surgery or radiation therapy is likely to eradicate his primary tumor. We discussed the potential acute and late toxicities of radiation therapy. I put him a cure rate of no greater than 60-70% with either surgery or radiation therapy primarily because of the risk for occult metastases. He will think things over and get back in touch with Dr. Margarita Grizzle. Should he want to pursue radiation therapy he should immediately undergo in her deprivation therapy and begin his treatment  Approximately 2-1/2-3 months later.we will also need to have Dr. Margarita Grizzle place 3 gold seed markers for  prostate localization for his image guidance.    PLAN: As discussed above.    I spent 60 minutes minutes face to face with the patient and more than 50% of that time was spent in counseling and/or coordination of care.

## 2011-08-19 NOTE — Progress Notes (Signed)
Encounter addended by: Tessa Lerner, RN on: 08/19/2011  5:16 PM<BR>     Documentation filed: Charges VN

## 2011-08-23 NOTE — Progress Notes (Signed)
Encounter addended by: Agnes Lawrence, RN on: 08/23/2011  3:56 PM<BR>     Documentation filed: Charges VN, Inpatient Patient Education

## 2011-09-27 ENCOUNTER — Encounter (HOSPITAL_COMMUNITY): Payer: Self-pay | Admitting: Pharmacy Technician

## 2011-10-06 ENCOUNTER — Encounter (HOSPITAL_COMMUNITY)
Admission: RE | Admit: 2011-10-06 | Discharge: 2011-10-06 | Disposition: A | Discharge status: Home / Self Care | Payer: Medicare Other | Source: Ambulatory Visit | Attending: Urology | Admitting: Urology

## 2011-10-06 ENCOUNTER — Encounter (HOSPITAL_COMMUNITY): Payer: Self-pay

## 2011-10-06 LAB — CBC
Platelets: 276 10*3/uL (ref 150–400)
RBC: 4.74 MIL/uL (ref 4.22–5.81)
RDW: 12 % (ref 11.5–15.5)
WBC: 7.4 10*3/uL (ref 4.0–10.5)

## 2011-10-06 LAB — BASIC METABOLIC PANEL
CO2: 28 mEq/L (ref 19–32)
Calcium: 10 mg/dL (ref 8.4–10.5)
Chloride: 103 mEq/L (ref 96–112)
GFR calc Af Amer: >90 mL/min (ref >90)
Sodium: 139 mEq/L (ref 135–145)

## 2011-10-06 LAB — SURGICAL PCR SCREEN: MRSA, PCR: NEGATIVE

## 2011-10-06 NOTE — Patient Instructions (Signed)
20 AMIN FOR[address]ALT  10/06/2011   Your procedure is scheduled on:  10/19/11  Report to SHORT STAY DEPT  at 6:30 AM.  Call this number if you have problems the morning of surgery: [phone]   Remember:   Do not eat food or drink liquids AFTER MIDNIGHT  May have clear liquids UNTIL 6 HOURS BEFORE SURGERY  Clear liquids include soda, tea, black coffee, apple or grape juice, broth.  Take these medicines the morning of surgery with A SIP OF WATER: PRILOSEC / LIPITOR   Do not wear jewelry, make-up or nail polish.  Do not wear lotions, powders, or perfumes.   Do not shave legs or underarms 48 hrs. before surgery (men may shave face)  Do not bring valuables to the hospital.  Contacts, dentures or bridgework may not be worn into surgery.  Leave suitcase in the car. After surgery it may be brought to your room.  For patients admitted to the hospital, checkout time is 11:00 AM the day of discharge.   Patients discharged the day of surgery will not be allowed to drive home.    Special Instructions:   Please read over the following fact sheets that you were given: MRSA  Information               SHOWER WITH BETASEPT THE NIGHT BEFORE SURGERY AND THE MORNING OF SURGERY              FOLLOW BOWEL PREP

## 2011-10-17 NOTE — Anesthesia Preprocedure Evaluation (Addendum)
Anesthesia Evaluation  Patient identified by MRN, date of birth, ID band Patient awake    Reviewed: Allergy & Precautions, H&P , NPO status , Patient's Chart, lab work & pertinent test results  Airway Mallampati: II TM Distance: >3 FB Neck ROM: full    Dental No notable dental hx. (+) Teeth Intact and Dental Advisory Given   Pulmonary neg pulmonary ROS,  breath sounds clear to auscultation  Pulmonary exam normal       Cardiovascular Exercise Tolerance: Good negative cardio ROS  Rhythm:regular Rate:Normal     Neuro/Psych negative neurological ROS  negative psych ROS   GI/Hepatic negative GI ROS, Neg liver ROS, GERD-  Medicated and Controlled,  Endo/Other  negative endocrine ROSDiabetes mellitus-, Well Controlled, Type 2, Oral Hypoglycemic Agents  Renal/GU negative Renal ROS  negative genitourinary   Musculoskeletal   Abdominal   Peds  Hematology negative hematology ROS (+)   Anesthesia Other Findings   Reproductive/Obstetrics negative OB ROS                          Anesthesia Physical Anesthesia Plan  ASA: II  Anesthesia Plan: General   Post-op Pain Management:    Induction: Intravenous  Airway Management Planned: Oral ETT  Additional Equipment:   Intra-op Plan:   Post-operative Plan: Extubation in OR  Informed Consent: I have reviewed the patients History and Physical, chart, labs and discussed the procedure including the risks, benefits and alternatives for the proposed anesthesia with the patient or authorized representative who has indicated his/her understanding and acceptance.   Dental Advisory Given  Plan Discussed with: CRNA and Surgeon  Anesthesia Plan Comments:         Anesthesia Quick Evaluation

## 2011-10-19 ENCOUNTER — Inpatient Hospital Stay (HOSPITAL_COMMUNITY)
Admission: RE | Admit: 2011-10-19 | Discharge: 2011-10-20 | DRG: 708 | Disposition: A | Discharge status: Home / Self Care | Payer: Medicare Other | Source: Ambulatory Visit | Attending: Urology | Admitting: Urology

## 2011-10-19 ENCOUNTER — Ambulatory Visit (HOSPITAL_COMMUNITY): Payer: Medicare Other | Admitting: Anesthesiology

## 2011-10-19 ENCOUNTER — Encounter (HOSPITAL_COMMUNITY)
Admission: RE | Disposition: A | Discharge status: Home / Self Care | Payer: Self-pay | Source: Ambulatory Visit | Attending: Urology

## 2011-10-19 ENCOUNTER — Encounter (HOSPITAL_COMMUNITY): Payer: Self-pay | Admitting: *Deleted

## 2011-10-19 ENCOUNTER — Encounter (HOSPITAL_COMMUNITY): Payer: Self-pay | Admitting: Anesthesiology

## 2011-10-19 DIAGNOSIS — C61 Malignant neoplasm of prostate: Principal | ICD-10-CM | POA: Diagnosis present

## 2011-10-19 DIAGNOSIS — Z87891 Personal history of nicotine dependence: Secondary | ICD-10-CM

## 2011-10-19 DIAGNOSIS — K219 Gastro-esophageal reflux disease without esophagitis: Secondary | ICD-10-CM | POA: Diagnosis present

## 2011-10-19 DIAGNOSIS — Z79899 Other long term (current) drug therapy: Secondary | ICD-10-CM

## 2011-10-19 DIAGNOSIS — Z7982 Long term (current) use of aspirin: Secondary | ICD-10-CM

## 2011-10-19 DIAGNOSIS — Z85820 Personal history of malignant melanoma of skin: Secondary | ICD-10-CM

## 2011-10-19 DIAGNOSIS — E785 Hyperlipidemia, unspecified: Secondary | ICD-10-CM | POA: Diagnosis present

## 2011-10-19 DIAGNOSIS — E119 Type 2 diabetes mellitus without complications: Secondary | ICD-10-CM | POA: Diagnosis present

## 2011-10-19 HISTORY — PX: ROBOT ASSISTED LAPAROSCOPIC RADICAL PROSTATECTOMY: SHX5141

## 2011-10-19 LAB — BASIC METABOLIC PANEL
CO2: 25 mEq/L (ref 19–32)
Calcium: 8.6 mg/dL (ref 8.4–10.5)
Creatinine, Ser: 0.92 mg/dL (ref 0.50–1.35)
GFR calc non Af Amer: 86 mL/min — ABNORMAL LOW (ref >90)
Glucose, Bld: 202 mg/dL — ABNORMAL HIGH (ref 70–99)
Sodium: 138 mEq/L (ref 135–145)

## 2011-10-19 LAB — GLUCOSE, CAPILLARY
Glucose-Capillary: 151 mg/dL — ABNORMAL HIGH (ref 70–99)
Glucose-Capillary: 202 mg/dL — ABNORMAL HIGH (ref 70–99)

## 2011-10-19 LAB — TYPE AND SCREEN
ABO/RH(D): A POS
Antibody Screen: NEGATIVE

## 2011-10-19 LAB — HEMOGLOBIN AND HEMATOCRIT, BLOOD
HCT: 38.8 % — ABNORMAL LOW (ref 39.0–52.0)
Hemoglobin: 13.1 g/dL (ref 13.0–17.0)

## 2011-10-19 SURGERY — ROBOTIC ASSISTED LAPAROSCOPIC RADICAL PROSTATECTOMY
Anesthesia: General | Wound class: Clean Contaminated

## 2011-10-19 MED ORDER — SENNOSIDES-DOCUSATE SODIUM 8.6-50 MG PO TABS
1.0000 | ORAL_TABLET | Freq: Two times a day (BID) | ORAL | Status: DC
  Administered 2011-10-19 – 2011-10-20 (×2): 1 via ORAL
  Filled 2011-10-19 (×3): qty 1

## 2011-10-19 MED ORDER — PROMETHAZINE HCL 25 MG/ML IJ SOLN
INTRAMUSCULAR | Status: AC
  Filled 2011-10-19: qty 1

## 2011-10-19 MED ORDER — HEPARIN SODIUM (PORCINE) 5000 UNIT/ML IJ SOLN
5000.0000 [IU] | Freq: Three times a day (TID) | INTRAMUSCULAR | Status: DC
  Administered 2011-10-19 – 2011-10-20 (×3): 5000 [IU] via SUBCUTANEOUS
  Filled 2011-10-19 (×5): qty 1

## 2011-10-19 MED ORDER — INSULIN ASPART 100 UNIT/ML ~~LOC~~ SOLN
0.0000 [IU] | Freq: Three times a day (TID) | SUBCUTANEOUS | Status: DC
  Administered 2011-10-20: 2 [IU] via SUBCUTANEOUS

## 2011-10-19 MED ORDER — BACITRACIN-NEOMYCIN-POLYMYXIN OINTMENT TUBE
TOPICAL_OINTMENT | Freq: Three times a day (TID) | CUTANEOUS | Status: DC
  Administered 2011-10-19 – 2011-10-20 (×2): via TOPICAL
  Filled 2011-10-19: qty 15

## 2011-10-19 MED ORDER — CIPROFLOXACIN HCL 500 MG PO TABS
500.0000 mg | ORAL_TABLET | Freq: Two times a day (BID) | ORAL | Status: DC
  Administered 2011-10-19 – 2011-10-20 (×2): 500 mg via ORAL
  Filled 2011-10-19 (×4): qty 1

## 2011-10-19 MED ORDER — STERILE WATER FOR IRRIGATION IR SOLN
Status: DC | PRN
  Administered 2011-10-19: 3000 mL

## 2011-10-19 MED ORDER — HEPARIN SODIUM (PORCINE) 5000 UNIT/ML IJ SOLN
INTRAMUSCULAR | Status: AC
  Filled 2011-10-19: qty 1

## 2011-10-19 MED ORDER — HEPARIN SODIUM (PORCINE) 1000 UNIT/ML IJ SOLN
INTRAMUSCULAR | Status: AC
  Filled 2011-10-19: qty 1

## 2011-10-19 MED ORDER — PROMETHAZINE HCL 25 MG/ML IJ SOLN
6.2500 mg | INTRAMUSCULAR | Status: DC | PRN
  Administered 2011-10-19: 6.25 mg via INTRAVENOUS

## 2011-10-19 MED ORDER — HYDROCODONE-ACETAMINOPHEN 5-325 MG PO TABS: 1.0000 | ORAL_TABLET | ORAL | Status: DC | PRN

## 2011-10-19 MED ORDER — OXYCODONE HCL 5 MG PO TABS: 5.0000 mg | ORAL_TABLET | ORAL | Status: AC | PRN

## 2011-10-19 MED ORDER — NEOSTIGMINE METHYLSULFATE 1 MG/ML IJ SOLN
INTRAMUSCULAR | Status: DC | PRN
  Administered 2011-10-19: 4 mg via INTRAVENOUS

## 2011-10-19 MED ORDER — MORPHINE SULFATE 2 MG/ML IJ SOLN
2.0000 mg | INTRAMUSCULAR | Status: DC | PRN
  Administered 2011-10-19: 4 mg via INTRAVENOUS
  Filled 2011-10-19: qty 2

## 2011-10-19 MED ORDER — ATORVASTATIN CALCIUM 10 MG PO TABS
10.0000 mg | ORAL_TABLET | Freq: Every day | ORAL | Status: DC
Start: 2011-10-20 — End: 2011-10-20
  Administered 2011-10-20: 10 mg via ORAL
  Filled 2011-10-19: qty 1

## 2011-10-19 MED ORDER — INSULIN ASPART 100 UNIT/ML ~~LOC~~ SOLN: 0.0000 [IU] | Freq: Every day | SUBCUTANEOUS | Status: DC

## 2011-10-19 MED ORDER — OXYCODONE HCL 5 MG PO TABS
5.0000 mg | ORAL_TABLET | ORAL | Status: DC | PRN
  Administered 2011-10-19: 10 mg via ORAL
  Filled 2011-10-19: qty 2

## 2011-10-19 MED ORDER — HYDROMORPHONE HCL PF 1 MG/ML IJ SOLN
INTRAMUSCULAR | Status: DC | PRN
  Administered 2011-10-19 (×4): 0.5 mg via INTRAVENOUS

## 2011-10-19 MED ORDER — SODIUM CHLORIDE 0.9 % IV BOLUS (SEPSIS)
1000.0000 mL | Freq: Once | INTRAVENOUS | Status: AC
  Administered 2011-10-19: 1000 mL via INTRAVENOUS

## 2011-10-19 MED ORDER — BUPIVACAINE LIPOSOME 1.3 % IJ SUSP
20.0000 mL | Freq: Once | INTRAMUSCULAR | Status: AC
  Administered 2011-10-19: 35 mL
  Filled 2011-10-19: qty 20

## 2011-10-19 MED ORDER — LACTATED RINGERS IV SOLN
INTRAVENOUS | Status: DC | PRN
  Administered 2011-10-19 (×3): via INTRAVENOUS

## 2011-10-19 MED ORDER — MIDAZOLAM HCL 5 MG/5ML IJ SOLN
INTRAMUSCULAR | Status: DC | PRN
  Administered 2011-10-19: 2 mg via INTRAVENOUS

## 2011-10-19 MED ORDER — HYOSCYAMINE SULFATE 0.125 MG SL SUBL
0.1250 mg | SUBLINGUAL_TABLET | SUBLINGUAL | Status: DC | PRN
  Filled 2011-10-19: qty 1

## 2011-10-19 MED ORDER — GLYCOPYRROLATE 0.2 MG/ML IJ SOLN
INTRAMUSCULAR | Status: DC | PRN
  Administered 2011-10-19: .6 mg via INTRAVENOUS

## 2011-10-19 MED ORDER — ACETAMINOPHEN 10 MG/ML IV SOLN
INTRAVENOUS | Status: DC | PRN
  Administered 2011-10-19: 1000 mg via INTRAVENOUS

## 2011-10-19 MED ORDER — METFORMIN HCL 500 MG PO TABS
1000.0000 mg | ORAL_TABLET | Freq: Two times a day (BID) | ORAL | Status: DC
  Administered 2011-10-19 – 2011-10-20 (×2): 1000 mg via ORAL
  Filled 2011-10-19 (×4): qty 2

## 2011-10-19 MED ORDER — HYOSCYAMINE SULFATE 0.125 MG SL SUBL: 0.1250 mg | SUBLINGUAL_TABLET | SUBLINGUAL | Status: DC | PRN

## 2011-10-19 MED ORDER — ONDANSETRON HCL 4 MG/2ML IJ SOLN
INTRAMUSCULAR | Status: DC | PRN
  Administered 2011-10-19: 4 mg via INTRAVENOUS

## 2011-10-19 MED ORDER — LACTATED RINGERS IV SOLN: INTRAVENOUS | Status: DC

## 2011-10-19 MED ORDER — CIPROFLOXACIN IN D5W 400 MG/200ML IV SOLN
INTRAVENOUS | Status: AC
  Filled 2011-10-19: qty 200

## 2011-10-19 MED ORDER — OXYBUTYNIN CHLORIDE 5 MG PO TABS: 5.0000 mg | ORAL_TABLET | Freq: Four times a day (QID) | ORAL | Status: DC | PRN

## 2011-10-19 MED ORDER — BISACODYL 10 MG RE SUPP
10.0000 mg | Freq: Two times a day (BID) | RECTAL | Status: DC
  Administered 2011-10-20: 10 mg via RECTAL
  Filled 2011-10-19: qty 1

## 2011-10-19 MED ORDER — CIPROFLOXACIN IN D5W 400 MG/200ML IV SOLN
400.0000 mg | INTRAVENOUS | Status: AC
  Administered 2011-10-19: 400 mg via INTRAVENOUS

## 2011-10-19 MED ORDER — SODIUM CHLORIDE 0.9 % IV SOLN
INTRAVENOUS | Status: DC
  Administered 2011-10-19: 1000 mL via INTRAVENOUS

## 2011-10-19 MED ORDER — HEPARIN SODIUM (PORCINE) 5000 UNIT/ML IJ SOLN
5000.0000 [IU] | INTRAMUSCULAR | Status: AC
  Administered 2011-10-19: 5000 [IU] via SUBCUTANEOUS

## 2011-10-19 MED ORDER — ROCURONIUM BROMIDE 100 MG/10ML IV SOLN
INTRAVENOUS | Status: DC | PRN
  Administered 2011-10-19 (×5): 10 mg via INTRAVENOUS
  Administered 2011-10-19: 50 mg via INTRAVENOUS
  Administered 2011-10-19 (×2): 10 mg via INTRAVENOUS

## 2011-10-19 MED ORDER — PROPOFOL 10 MG/ML IV BOLUS
INTRAVENOUS | Status: DC | PRN
  Administered 2011-10-19: 180 mg via INTRAVENOUS

## 2011-10-19 MED ORDER — INDIGOTINDISULFONATE SODIUM 8 MG/ML IJ SOLN
INTRAMUSCULAR | Status: DC | PRN
  Administered 2011-10-19 (×2): 5 mL via INTRAVENOUS

## 2011-10-19 MED ORDER — SENNOSIDES-DOCUSATE SODIUM 8.6-50 MG PO TABS: 1.0000 | ORAL_TABLET | Freq: Two times a day (BID) | ORAL | Status: AC

## 2011-10-19 MED ORDER — HYDROMORPHONE HCL PF 1 MG/ML IJ SOLN
0.2500 mg | INTRAMUSCULAR | Status: DC | PRN
  Administered 2011-10-19 (×2): 0.25 mg via INTRAVENOUS

## 2011-10-19 MED ORDER — BACITRACIN-NEOMYCIN-POLYMYXIN OINTMENT TUBE: 1.0000 "application " | TOPICAL_OINTMENT | Freq: Three times a day (TID) | CUTANEOUS | Status: DC

## 2011-10-19 MED ORDER — INDIGOTINDISULFONATE SODIUM 8 MG/ML IJ SOLN
INTRAMUSCULAR | Status: AC
Start: 2011-10-19 — End: 2011-10-19
  Filled 2011-10-19: qty 10

## 2011-10-19 MED ORDER — HYDROMORPHONE HCL PF 1 MG/ML IJ SOLN
INTRAMUSCULAR | Status: AC
  Filled 2011-10-19: qty 1

## 2011-10-19 MED ORDER — LACTATED RINGERS IV SOLN
INTRAVENOUS | Status: DC | PRN
  Administered 2011-10-19: 10:00:00

## 2011-10-19 MED ORDER — SODIUM CHLORIDE 0.9 % IR SOLN
Status: DC | PRN
  Administered 2011-10-19: 1000 mL

## 2011-10-19 MED ORDER — OXYBUTYNIN CHLORIDE 5 MG PO TABS
5.0000 mg | ORAL_TABLET | Freq: Four times a day (QID) | ORAL | Status: DC | PRN
  Filled 2011-10-19: qty 1

## 2011-10-19 MED ORDER — CIPROFLOXACIN HCL 500 MG PO TABS: 500.0000 mg | ORAL_TABLET | Freq: Two times a day (BID) | ORAL | Status: AC

## 2011-10-19 MED ORDER — FENTANYL CITRATE 0.05 MG/ML IJ SOLN
INTRAMUSCULAR | Status: DC | PRN
  Administered 2011-10-19: 50 ug via INTRAVENOUS
  Administered 2011-10-19: 100 ug via INTRAVENOUS
  Administered 2011-10-19: 50 ug via INTRAVENOUS
  Administered 2011-10-19: 100 ug via INTRAVENOUS
  Administered 2011-10-19 (×4): 50 ug via INTRAVENOUS

## 2011-10-19 MED ORDER — ACETAMINOPHEN 10 MG/ML IV SOLN
1000.0000 mg | Freq: Four times a day (QID) | INTRAVENOUS | Status: AC
  Administered 2011-10-19 – 2011-10-20 (×4): 1000 mg via INTRAVENOUS
  Filled 2011-10-19 (×4): qty 100

## 2011-10-19 MED ORDER — METOPROLOL TARTRATE 1 MG/ML IV SOLN: 5.0000 mg | INTRAVENOUS | Status: DC | PRN

## 2011-10-19 MED ORDER — ACETAMINOPHEN 10 MG/ML IV SOLN
INTRAVENOUS | Status: AC
  Filled 2011-10-19: qty 100

## 2011-10-19 MED ORDER — BISACODYL 10 MG RE SUPP: 10.0000 mg | Freq: Two times a day (BID) | RECTAL | Status: AC

## 2011-10-19 MED ORDER — PANTOPRAZOLE SODIUM 40 MG PO TBEC
40.0000 mg | DELAYED_RELEASE_TABLET | Freq: Every day | ORAL | Status: DC
  Administered 2011-10-19 – 2011-10-20 (×2): 40 mg via ORAL
  Filled 2011-10-19 (×2): qty 1

## 2011-10-19 SURGICAL SUPPLY — 46 items
APPLIER CLIP 5 13 M/L LIGAMAX5 (MISCELLANEOUS) ×3
APR CLP MED LRG 5 ANG JAW (MISCELLANEOUS) ×2
CANISTER SUCTION 2500CC (MISCELLANEOUS) ×3 IMPLANT
CATH ROBINSON RED A/P 8FR (CATHETERS) ×3 IMPLANT
CHLORAPREP W/TINT 26ML (MISCELLANEOUS) ×3 IMPLANT
CLIP APPLIE 5 13 M/L LIGAMAX5 (MISCELLANEOUS) ×2 IMPLANT
CLIP LIGATING HEM O LOK PURPLE (MISCELLANEOUS) ×8 IMPLANT
CLIP LIGATING HEMO O LOK GREEN (MISCELLANEOUS) ×2 IMPLANT
CLOTH BEACON ORANGE TIMEOUT ST (SAFETY) ×3 IMPLANT
CORD HIGH FREQUENCY UNIPOLAR (ELECTROSURGICAL) ×3 IMPLANT
COVER SURGICAL LIGHT HANDLE (MISCELLANEOUS) ×4 IMPLANT
COVER TIP SHEARS 8 DVNC (MISCELLANEOUS) ×2 IMPLANT
COVER TIP SHEARS 8MM DA VINCI (MISCELLANEOUS) ×2
CUTTER ECHEON FLEX ENDO 45 340 (ENDOMECHANICALS) ×3 IMPLANT
DECANTER SPIKE VIAL GLASS SM (MISCELLANEOUS) ×2 IMPLANT
DRAPE SURG IRRIG POUCH 19X23 (DRAPES) ×3 IMPLANT
DRSG TEGADERM 4X4.75 (GAUZE/BANDAGES/DRESSINGS) ×1 IMPLANT
DRSG TEGADERM 6X8 (GAUZE/BANDAGES/DRESSINGS) ×8 IMPLANT
ELECT REM PT RETURN 9FT ADLT (ELECTROSURGICAL) ×3
ELECTRODE REM PT RTRN 9FT ADLT (ELECTROSURGICAL) ×2 IMPLANT
GLOVE BIO SURGEON STRL SZ 6.5 (GLOVE) ×3 IMPLANT
GLOVE BIOGEL PI IND STRL 7.5 (GLOVE) ×4 IMPLANT
GLOVE BIOGEL PI INDICATOR 7.5 (GLOVE) ×2
GLOVE ECLIPSE 7.0 STRL STRAW (GLOVE) ×6 IMPLANT
GOWN PREVENTION PLUS XLARGE (GOWN DISPOSABLE) ×6 IMPLANT
GOWN STRL NON-REIN LRG LVL3 (GOWN DISPOSABLE) ×9 IMPLANT
HOLDER FOLEY CATH W/STRAP (MISCELLANEOUS) ×3 IMPLANT
NDL SAFETY ECLIPSE 18X1.5 (NEEDLE) ×2 IMPLANT
NEEDLE HYPO 18GX1.5 SHARP (NEEDLE) ×3
PACK ROBOT UROLOGY CUSTOM (CUSTOM PROCEDURE TRAY) ×3 IMPLANT
POSITIONER SURGICAL ARM (MISCELLANEOUS) ×4 IMPLANT
RELOAD GREEN ECHELON 45 (STAPLE) ×3 IMPLANT
SET TUBE IRRIG SUCTION NO TIP (IRRIGATION / IRRIGATOR) ×3 IMPLANT
SOLUTION ELECTROLUBE (MISCELLANEOUS) ×3 IMPLANT
SPONGE GAUZE 4X4 12PLY (GAUZE/BANDAGES/DRESSINGS) ×2 IMPLANT
SUT VIC AB 0 CT1 27 (SUTURE) ×9
SUT VIC AB 0 CT1 27XBRD ANTBC (SUTURE) IMPLANT
SUT VIC AB 2-0 SH 27 (SUTURE) ×3
SUT VIC AB 2-0 SH 27X BRD (SUTURE) IMPLANT
SUT VIC AB 3-0 SH 27 (SUTURE) ×6
SUT VIC AB 3-0 SH 27XBRD (SUTURE) IMPLANT
SUT VICRYL 0 UR6 27IN ABS (SUTURE) ×3 IMPLANT
SYR 27GX1/2 1ML LL SAFETY (SYRINGE) ×3 IMPLANT
TOWEL OR 17X26 10 PK STRL BLUE (TOWEL DISPOSABLE) ×3 IMPLANT
TOWEL OR NON WOVEN STRL DISP B (DISPOSABLE) ×3 IMPLANT
WATER STERILE IRR 1500ML POUR (IV SOLUTION) ×6 IMPLANT

## 2011-10-19 NOTE — Anesthesia Postprocedure Evaluation (Signed)
  Anesthesia Post-op Note  Patient: Frank Larsen  Procedure(s) Performed: Procedure(s) (LRB): ROBOTIC ASSISTED LAPAROSCOPIC RADICAL PROSTATECTOMY (N/A) LYMPHADENECTOMY (Bilateral)  Patient Location: PACU  Anesthesia Type: General  Level of Consciousness: awake and alert   Airway and Oxygen Therapy: Patient Spontanous Breathing  Post-op Pain: mild  Post-op Assessment: Post-op Vital signs reviewed, Patient's Cardiovascular Status Stable, Respiratory Function Stable, Patent Airway and No signs of Nausea or vomiting  Post-op Vital Signs: stable  Complications: No apparent anesthesia complications

## 2011-10-19 NOTE — Progress Notes (Signed)
Bowel prep completed.

## 2011-10-19 NOTE — Op Note (Addendum)
DATE OF PROCEDURE: 10/19/11   OPERATIVE REPORT  SURGEON: Natalia Leatherwood, MD  ASSISTANT:  Pecola Leisure, PA  PREOPERATIVE DIAGNOSIS: Prostate cancer.  POSTOPERATIVE DIAGNOSIS: Prostate cancer.  PROCEDURE PERFORMED: Robotic-assisted laparoscopic radical  Prostatectomy,bilateral wide dissection, bilateral extended pelvic lymph node dissection..  ESTIMATED BLOOD LOSS: 200 cc.  DRAINS: Jackson-Pratt drain, left lower quadrant and Foley catheter.  SPECIMEN: Prostate sent with seminal vesicles in its entirety.  FINDINGS: Negative pelvic lymph nodes bilaterally on frozen section. Frank Larsen  HISTORY OF PRESENT ILLNESS: 66 year old male diagnosed with high risk prostate cancer. After evaluation and discussion of management options, he elected for surgical extirpation of his prostate.   PROCEDURE IN DETAIL: Informed consent was obtained. He received subcutaneous heparin for DVT prophylaxis before being taken to the OR. The patient was taken to the operating room, where he was placed in the supine position. IV antibiotics were infused and general anesthesia was induced. A time- out was performed, in which the correct patient, surgical site, and procedure were identified and agreed upon by the team. Hair was removed  from his abdomen and genitals and then he was placed in a dorsal  lithotomy position. All pertinent neurovascular pressure points were padded appropriately. His arms were tucked to the side using gel padding. After this, his abdomen and genitals were prepped and draped in the usual sterile fashion. A Foley catheter was placed on the field, 10 cc of sterile water was placed into the balloon.   Access was gained for laparoscopic procedure by using the Hasson technique by cutting down to the fascia in the midline above the umbilicus. Once the peritoneum was accessed, a 10 mm  port was placed and abdomen was insufflated with carbon dioxide. Opening pressures were initially low so insufflation was  continued. Next, markings were made for placement of the 1st & 3rd robotic arm ports in the usual areas with the ports being placed approximately 10 cm from the midline on either  side of camera (2nd) arm.  A 10 mm assistant port was placed on the far right lateral side of the abdomen. A 5 mm assistant port was placed between the right robotic (1st arm) and the camera port (2nd arm). The 4th robotic arm port was placed at the far left lateral side of the abdomen. All these were placed under direct visualization.   He was then placed in a Trendelenburg position, and the robot was docked to the robotic ports.  After this was done, the urachal remnant was identified and divided and the bladder was  Dissected from the anterior wall of the abdomen. This was taken down to  the endopelvic fascia bilaterally and the perineum was split down to the  vas deferens bilaterally. The vas deferens was divided bipolar and monopolar electrocautery.   Attention was turned to the right pelvis. The peritoneum was incised until the right external iliac vein was identified. Careful dissection was performed to release the nodal packet lateral to the vein. Dissection was then carried out inferior and medially to the vein until the lateral pelvic wall was encountered. Gentle dissection was then carried proximally and distally along the vein. The obturator nerve was identified early at its proximal and distal ends. Dissection of this packet was carried out making sure not to cause injury to the obturator nerve. Dissection was then carried proximally until the bifurcation of the iliac arteries was encountered. Dissection was carried laterally from the external iliac artery until the genitofemoral nerve was encountered making sure  not to damage this structure. Lymphatic tissue was then removed from the area overlying the common iliac artery and the areas overlying the internal iliac artery. Hemostasis and lymph vessels were controlled  with metallic surgical clips. The lymph node packets were then removed and sent for frozen pathology.  Attention was turned to the left pelvis. The peritoneum was incised until the left external iliac vein was identified. Careful dissection was performed to release the nodal packet lateral to the vein. Dissection was then carried out inferior and medially to the vein until the lateral pelvic wall was encountered. Gentle dissection was then carried proximally and distally along the vein. The obturator nerve was identified early at its proximal and distal ends. Dissection of this packet was carried out making sure not to cause injury to the obturator nerve. Dissection was then carried proximally until the bifurcation of the iliac arteries was encountered. Dissection was carried laterally from the external iliac artery until the genitofemoral nerve was encountered making sure not to damage this structure. Lymphatic tissue was then removed from the area overlying the common iliac artery and the areas overlying the internal iliac artery. Hemostasis and lymph vessels were controlled with metallic surgical clips. The lymph node packets were then removed and sent for frozen pathology.   After this was done, the prograsp was used to  retract the bladder cephalad. After this was done, the endopelvic fascia was incised  bilaterally and carried anterior and posteriorly bilaterally. The  puboprostatic ligaments were then divided and then the stapler device  was used to divide and ligate the dorsal venous complex.   Pathologist called and informed us all lymph nodes were negative on frozen section.  After this was complete, attention was turned back to the junction between the prostate and bladder neck.  Dissection was carried down between the prostate and the bladder until  the Foley catheter was encountered. It was deflated and then placed  through the dissection site and then anterior traction was placed by    grasping the catheter with the 4th robot arm. The remainder of the bladder neck  was then dissected out, and dissection was carried down posteriorly making sure to avoid reentry into the bladder until the seminal vesicles were encountered.   The seminal vesicles and vas deferens were then dissected out completely bilaterally. After  these were done, they were placed on traction anteriorly and blunt  dissection was carried out between the rectum and the prostate.   Attention was turned to the right prostatic pedicle. A wide dissection of this side was carried out. The right prostatic pedicle was controlled with sharp dissection and Hem-o-lok clips. The same was done with the left prostate vascular pedicle.   Dissection was then carried around posterior to the prostate to the previous dissected area between the rectum and prostate. It was noted that the perirectal fat was adherent to the prostate.   Dissection was then carried up to the urethra which was all that remained attaching the prostate to the body. The urethra was  then divided with scissors.  More DVC tissue was encountered and divided with scissors. This was over sewn a running 2-0 vicryl until hemostasis was obtained.  The urethra was then complete divided  and the prostate was placed to the side.   A piece of tissue on the perirectal fat was concerning for a possible positive surgical margin on the left so this tissue was sent for frozen section and returned negative for malignancy.  The pelvis was irrigated and flushing of air into the rectal catheter showed no air bubbles.   Due to the large size of the prostate, there was a large defect in the bladder neck. Bladder neck tailoring was performed with a running 2-0 Vicryl on the left lateral side of the bladder. Attention was made to avoid the ureteral orifice and to ensure that it was a mucosal to mucosal apposition. After this, anastomosis of the bladder neck to the urethra was  carried out. Double-armed Monocryl suture was used in a running  fashion to perform the anastomosis. After this was done, a Foley  catheter was placed through the anastomosis and into the bladder. It was irrigated and found to be  water tight. It was tied down and the suture needles were removed from the  body.   A Jackson-Pratt drain was placed through the port of the 4th robot arm. The specimen was placed in the EndoCatch bag. The robot was  undocked and the operating table was leveled. The EndoCatch bag was removed from the body by enlarging  the midline of the umbilicus. All ports were checked and found to be  free of bleeding. The 10 mm assistant port was closed with a suture  passer under direct visualization. The fascia of the umbilical port was close with interrupted vicryl suture in a figure-of-eight fashion until there was no defect palpable. Care was taken to avoid incorporation of intra-abdominal contents into the closure. After this was done, the drain was sutured into place, and the local injection with 244mg  Exparel was performed. All wounds were irrigated with  sterile normal saline and then closed with staples.  Tegaderm and drain gauze was placed over the Jackson-Pratt drain and staples, and  Foley catheter was placed to closed drainage. The patient was placed  back in supine position. Anesthesia was reversed. He was taken to PACU  in stable condition. He will be kept overnight for evaluation.

## 2011-10-19 NOTE — Transfer of Care (Signed)
Immediate Anesthesia Transfer of Care Note  Patient: Frank Larsen  Procedure(s) Performed: Procedure(s) (LRB): ROBOTIC ASSISTED LAPAROSCOPIC RADICAL PROSTATECTOMY (N/A) LYMPHADENECTOMY (Bilateral)  Patient Location: PACU  Anesthesia Type: General  Level of Consciousness: awake, alert , oriented, patient cooperative and responds to stimulation  Airway & Oxygen Therapy: Patient Spontanous Breathing and Patient connected to face mask oxygen  Post-op Assessment: Report given to PACU RN, Post -op Vital signs reviewed and stable and Patient moving all extremities X 4  Post vital signs: stable  Complications: No apparent anesthesia complications

## 2011-10-19 NOTE — H&P (Signed)
Urology History and Physical Exam  CC: High risk prostate cancer  HPI: 66 year old male diagnosed with prostate cancer on saturation prostate biopsy under MAC due to elevated PSA 3.6 (7.2 corrected). His final Gleason grade was 5+4=9. Prostate volume was 90cc. Due to his high risk disease he had staging with labs, a bone scan, and a CT scan. CT revealed nonspecific sclerotic foci in L5 vertebral body and right superior acetabulum which were described as "tiny". These did not show up on bone scan. We discussed that this could represent metastatic disease but that this could not be concluded. After discussion of risks, benefits, alternatives, and likelihood of achieving his goals, the patient wishes to proceed with surgery. He presents today for robot assisted laparoscopic radical prostatectomy with bilateral pelvic lymph node dissection.  Today we discussed proceeding with pelvic lymph node dissection and frozen section before proceeding with removal of the prostate. I explained that there were conflicting studies regarding the benefit of prostatectomy in the face of metastatic disease to the lymph nodes and the discussed the options of proceeding versus continuing with removal of the prostate. If his lymph nodes are positive, he wishes to abort the prostatectomy.   PMH: Past Medical History  Diagnosis Date  . Normal cardiac stress test 2010  . Prostate nodule   . Diabetes mellitus ORAL MED  . Hyperlipidemia   . GERD (gastroesophageal reflux disease)   . Impaired hearing BILATERAL HEARING AIDS    20% RIGHT HEARING DUE TO VIRUS  . Elevated PSA   . Skin cancer     malignant melanoma of skin /Prostate    PSH: Past Surgical History  Procedure Date  . Shoulder open rotator cuff repair 07-29-2010- RIGHT    AND SUBACROMIAL DECOMPRESSION AROMIOPLASTY  . Carpometacarpal joint arthrotomy 03-22-2006    RIGHT THUMB  . Carpometacarpal joint arthrotomy 12-21-2005    LEFT THUMB  . Melanoma excision  07-12-10    RIGHT NECK  . Tonsillectomy AGE 12  . Appendectomy AGE 34  . Prostate biopsy 07/20/2011    Procedure: BIOPSY TRANSRECTAL ULTRASONIC PROSTATE (TUBP);  Surgeon: Milford Cage, MD;  Location: [facility];  Service: Urology;  Laterality: N/A;  1 hour requested for this case  Saturation BX  OFFICE TO BRING ULTRASOUND MACHINE      Allergies: Allergies  Allergen Reactions  . Cephalexin Hives    Medications: Prescriptions prior to admission  Medication Sig Dispense Refill  . aspirin 325 MG tablet Take 325 mg by mouth daily with breakfast.       . atorvastatin (LIPITOR) 10 MG tablet Take 10 mg by mouth daily with breakfast.       . finasteride (PROSCAR) 5 MG tablet Take 5 mg by mouth daily with breakfast.       . metFORMIN (GLUCOPHAGE) 1000 MG tablet Take 1,000 mg by mouth 2 (two) times daily with a meal.      . Multiple Vitamin (MULTIVITAMIN) tablet Take 1 tablet by mouth daily.      [name] omeprazole (PRILOSEC) 20 MG capsule Take 20 mg by mouth every morning.      . pseudoephedrine-acetaminophen (TYLENOL SINUS) 30-500 MG TABS Take 1 tablet by mouth daily.          Social History: History   Social History  . Marital Status: Married    Spouse Name: N/A    Number of Children: N/A  . Years of Education: N/A   Occupational History  . Retired  Chief Executive Officer   Social History Main Topics  . Smoking status: Former Smoker -- 1.0 packs/day for 43 years    Types: Cigarettes    Quit date: 12/07/2007  . Smokeless tobacco: Never Used  . Alcohol Use: Yes     OCCASIONAL  . Drug Use: No  . Sexually Active: Not on file   Other Topics Concern  . Not on file   Social History Narrative  . No narrative on file    Family History: Family History  Problem Relation Age of Onset  . Cancer Mother     breast/lived 15 years post  . Alzheimer's disease Mother     deceased    Review of Systems: Positive: None Negative: Dysuria, fever, SOB.  A  further 10 point review of systems was negative except what is listed in the HPI.  Physical Exam:  General: No acute distress.  Awake. Head:  Normocephalic.  Atraumatic. ENT:  EOMI.  Mucous membranes moist Neck:  Supple.  No lymphadenopathy. CV:  S1 present. S2 present. Regular rate. Pulmonary: Equal effort bilaterally.  Clear to auscultation bilaterally. Abdomen: Soft.  Non- tender to palpation. Skin:  Normal turgor.  No visible rash. Extremity: No gross deformity of bilateral upper extremities.  No gross deformity of    bilateral lower extremities. Neurologic: Alert. Appropriate mood.    Studies:  No results found for this basename: HGB:2,WBC:2,PLT:2 in the last 72 hours  No results found for this basename: NA:2,K:2,CL:2,CO2:2,BUN:2,CREATININE:2,CALCIUM:2,MAGNESIUM:2,GFRNONAA:2,GFRAA:2 in the last 72 hours   No results found for this basename: PT:2,INR:2,APTT:2 in the last 72 hours   No components found with this basename: ABG:2    Assessment:  High risk prostate cancer  Plan: -To OR for robot assisted laparoscopic radical prostatectomy with bilateral pelvic lymph node dissection.

## 2011-10-19 NOTE — Discharge Instructions (Signed)
DISCHARGE INSTRUCTIONS FOR RADICAL PROSTATECTOMY  ACTIVITY 1. No heavy lifting >10 pounds for 6 weeks 2. No sexual activity for 6 weeks 3. No strenuous activity for 6 weeks 4. No driving while on narcotic pain medications 5. Drink plenty of water 6. Continue to walk at home - you can still get blood clots when you are at  home, so keep active, but don't over do it. 7. You may resume normal activity in 2 wks (working but no heavy lifting) 8. DO NOT STRAIN TO HAVE A BOWEL MOVEMENT.  DIET 1. Go slow with your diet. You had a big surgery and your appetite will not be  as it was before surgery for quite some time. You may also notice a metallic  taste in your mouth, this should go away in a few weeks. Make sure you stay well  hydrated and drink lots of water. You can buy Boost or Ensure supplements to  drink between meals and any other time as well to make sure you are getting  adequate nutrition. 2. Do not eat a lot of heavy meals (ex hamburgers, steak) right away - you may  get sick to your stomach. 3. Do not strain to have a bowel movement.  If you have not been able to have a  BM after several day, go to your local pharmacy and obtain Mild of Magnesia and  take it as instructed on the bottle.   BATHING/WOUND CARE 1. You can shower and we recommend daily showers.  If you have steri strips over your  incision will fall off on their own in a week or so.  If you have staples, they will be removed when your catheter is removed.  2. The JP site will close up on its own in a few days - replace the 4x4 gauze if  it becomes saturated. 3. You do not need to dress your surgical wounds.  Let the steri-strips fall off  on their own. 4. DO NOT SUBMERGE YOUR CATHETER OR WOUND UNDER WATER.  CATHETER 1. Keep your catheter secured to your leg at all times with tape. 2. You may experience leakage of urine around your catheter- as long as the  catheter continues to drain, this is normal.  If your  catheter stops draining  return to the ER, but do not let anyone other than a urologist replace your  catheter. 3. You may also have blood in your urine, even after it has been clear for  several days; you may even pass some small blood clots or other material.  This  is normal as well.  If this happens, sit down and drink plenty of water to help  make urine to flush out your bladder.  If the blood in your urine becomes worse  after doing this, contact our office or return to the ER. 4. You may use the leg bag (small bag) during the day, but use the large bag at  night.    SIGNS/SYMPTOMS TO CALL: 1. Please call us if you have a fever greater than 101.5, uncontrolled  nausea/vomiting, uncontrolled pain, dizziness, unable to urinate,   chest pain, shortness of breath, leg swelling, leg pain, or any other concerns  or questions.  You can reach [place] at [phone].

## 2011-10-20 LAB — BASIC METABOLIC PANEL
BUN: 10 mg/dL (ref 6–23)
Chloride: 104 mEq/L (ref 96–112)
Creatinine, Ser: 0.93 mg/dL (ref 0.50–1.35)
Glucose, Bld: 157 mg/dL — ABNORMAL HIGH (ref 70–99)
Potassium: 4.1 mEq/L (ref 3.5–5.1)

## 2011-10-20 LAB — GLUCOSE, CAPILLARY: Glucose-Capillary: 138 mg/dL — ABNORMAL HIGH (ref 70–99)

## 2011-10-20 MED ORDER — ONDANSETRON HCL 4 MG/2ML IJ SOLN
4.0000 mg | INTRAMUSCULAR | Status: DC | PRN
  Administered 2011-10-19: 4 mg via INTRAVENOUS

## 2011-10-20 MED ORDER — BISACODYL 10 MG RE SUPP: 10.0000 mg | Freq: Once | RECTAL | Status: DC

## 2011-10-20 NOTE — Progress Notes (Signed)
Discharge instructions reviewed with pt and new prescriptions. Foley and leg bag care teaching done. All questions answered. Pt given 2 legs bags, drainage bag and urinal for home. Julio Sicks RN

## 2011-10-20 NOTE — Care Management Note (Signed)
    Page 1 of 1   10/20/2011     2:07:01 PM   CARE MANAGEMENT NOTE 10/20/2011  Patient:  Frank Larsen, Frank Larsen   Account Number:  [id number]  Date Initiated:  10/20/2011  Documentation initiated by:  Frank Larsen  Subjective/Objective Assessment:   ADMITTED W/PROSTATE CA     Action/Plan:   FROM HOME   Anticipated DC Date:  10/20/2011   Anticipated DC Plan:  HOME/SELF CARE      DC Planning Services  CM consult      Choice offered to / List presented to:             Status of service:  Completed, signed off Medicare Important Message given?   (If response is "NO", the following Medicare IM given date fields will be blank) Date Medicare IM given:   Date Additional Medicare IM given:    Discharge Disposition:  HOME/SELF CARE  Per UR Regulation:  Reviewed for med. necessity/level of care/duration of stay  If discussed at Long Length of Stay Meetings, dates discussed:    Comments:  10/20/11 Frank Biel RN,BSN NCM [phone] S/P RADICAL PROSTATECTOMY.

## 2011-10-20 NOTE — Progress Notes (Signed)
Patient ID: Frank Larsen, male   DOB: [birth year], 66 y.o.   MRN: [id number]  1 Day Post-Op Subjective: The patient is doing well.  No nausea or vomiting. Pain is adequately controlled.  Objective: Vital signs in last 24 hours: Temp:  [97.9 F (36.6 C)-99.3 F (37.4 C)] 97.9 F (36.6 C) (06/13 0546) Pulse Rate:  [60-96] 60  (06/13 0546) Resp:  [9-18] 18  (06/13 0546) BP: (116-175)/(61-78) 137/68 mmHg (06/13 0546) SpO2:  [92 %-100 %] 94 % (06/13 0546) Weight:  [82.8 kg (182 lb 8.7 oz)-86.2 kg (190 lb 0.6 oz)] 82.8 kg (182 lb 8.7 oz) (06/12 2055)  Intake/Output from previous day: 06/12 0701 - 06/13 0700 In: 3767.1 [P.O.:240; I.V.:3327.1; IV Piggyback:200] Out: 2790 [Urine:2400; Drains:190; Blood:200] Intake/Output this shift: Total I/O In: 1254.6 [P.O.:240; I.V.:914.6; IV Piggyback:100] Out: 2350 [Urine:2200; Drains:150]  Physical Exam:  General: Alert and oriented. CV: RRR Lungs: Clear bilaterally. GI: Soft, Nondistended. Incisions: Dressings intact. Urine: Clear Extremities: Nontender, no erythema, no edema.  Lab Results:  Basename 10/20/11 0515 10/19/11 1759  HGB 12.5* 13.1  HCT 37.7* 38.8*      Assessment/Plan: POD# 1 s/p robotic prostatectomy.  1) SL IVF 2) Ambulate, Incentive spirometry 3) Transition to oral pain medication 4) Dulcolax suppository 5) D/C pelvic drain 6) Plan for likely discharge later today   Frank Larsen. MD   LOS: 1 day   Frank Larsen,LES 10/20/2011, 6:57 AM

## 2011-10-20 NOTE — Discharge Summary (Signed)
Date of admission: 10/19/2011  Date of discharge: 10/20/2011  Admission diagnosis: Prostate Cancer  Discharge diagnosis: Prostate Cancer  History and Physical: For full details, please see admission history and physical. Briefly, Frank Larsen is a 66 y.o. gentleman with localized prostate cancer.  After discussing management/treatment options, he elected to proceed with surgical treatment.  Hospital Course: Frank Larsen was taken to the operating room on 10/19/2011 and underwent a robotic assisted laparoscopic radical prostatectomy. He tolerated this procedure well and without complications. Postoperatively, he was able to be transferred to a regular hospital room following recovery from anesthesia.  He was able to begin ambulating the night of surgery. He remained hemodynamically stable overnight.  He had excellent urine output with appropriately minimal output from his pelvic drain and his pelvic drain was removed on POD #1.  He was transitioned to oral pain medication, tolerated a clear liquid diet, and had met all discharge criteria and was able to be discharged home later on POD#1.  Laboratory values:  Basename 10/20/11 0515 10/19/11 1759  HGB 12.5* 13.1  HCT 37.7* 38.8*    Disposition: Home  Discharge instruction: He was instructed to be ambulatory but to refrain from heavy lifting, strenuous activity, or driving. He was instructed on urethral catheter care.  Discharge medications:   Medication List  As of 10/20/2011  1:35 PM   START taking these medications         bisacodyl 10 MG suppository   Commonly known as: DULCOLAX   Place 1 suppository (10 mg total) rectally 2 (two) times daily.      ciprofloxacin 500 MG tablet   Commonly known as: CIPRO   Take 1 tablet (500 mg total) by mouth 2 (two) times daily.      hyoscyamine 0.125 MG SL tablet   Commonly known as: LEVSIN SL   Place 1 tablet (0.125 mg total) under the tongue every 4 (four) hours as needed (Bladder spasms).        neomycin-bacitracin-polymyxin Oint   Commonly known as: NEOSPORIN   Apply 1 application topically 3 (three) times daily. To tip of penis.      oxybutynin 5 MG tablet   Commonly known as: DITROPAN   Take 1 tablet (5 mg total) by mouth every 6 (six) hours as needed (Bladder spasms).      oxyCODONE 5 MG immediate release tablet   Commonly known as: Oxy IR/ROXICODONE   Take 1-2 tablets (5-10 mg total) by mouth every 4 (four) hours as needed.      senna-docusate 8.6-50 MG per tablet   Commonly known as: Senokot-S   Take 1 tablet by mouth 2 (two) times daily.         CONTINUE taking these medications         aspirin 325 MG tablet      atorvastatin 10 MG tablet   Commonly known as: LIPITOR      metFORMIN 1000 MG tablet   Commonly known as: GLUCOPHAGE      multivitamin tablet      omeprazole 20 MG capsule   Commonly known as: PRILOSEC      pseudoephedrine-acetaminophen 30-500 MG Tabs   Commonly known as: TYLENOL SINUS         STOP taking these medications         finasteride 5 MG tablet          Where to get your medications    These are the prescriptions that you need to pick  up.   You may get these medications from any pharmacy.         bisacodyl 10 MG suppository   ciprofloxacin 500 MG tablet   hyoscyamine 0.125 MG SL tablet   neomycin-bacitracin-polymyxin Oint   oxybutynin 5 MG tablet   oxyCODONE 5 MG immediate release tablet   senna-docusate 8.6-50 MG per tablet            Followup: He will followup in 1 week for catheter removal and to discuss his surgical pathology results.

## 2011-10-21 ENCOUNTER — Encounter (HOSPITAL_COMMUNITY): Payer: Self-pay | Admitting: Urology

## 2011-10-26 NOTE — Discharge Summary (Signed)
This patient was dischaged 10/20/11 after routine recovery from robot radical prostatectomy.

## 2011-12-30 ENCOUNTER — Encounter: Payer: Self-pay | Admitting: Internal Medicine

## 2011-12-30 ENCOUNTER — Other Ambulatory Visit: Payer: Self-pay | Admitting: *Deleted

## 2011-12-30 ENCOUNTER — Ambulatory Visit (INDEPENDENT_AMBULATORY_CARE_PROVIDER_SITE_OTHER): Payer: Medicare Other | Admitting: Internal Medicine

## 2011-12-30 DIAGNOSIS — Z789 Other specified health status: Secondary | ICD-10-CM

## 2011-12-30 MED ORDER — TYPHOID VACCINE PO CPDR: 1.0000 | DELAYED_RELEASE_CAPSULE | ORAL | Status: AC

## 2011-12-30 MED ORDER — AZITHROMYCIN 250 MG PO TABS: ORAL_TABLET | ORAL | Status: DC

## 2011-12-30 NOTE — Progress Notes (Signed)
[facility]  RFV: pre-travel counseling Subjective:    Patient ID: Frank Larsen, male    DOB: 07-13-1945, 66 y.o.   MRN: [id number]  HPI Frank Larsen is a 66yo Male who has history of prostate Ca s/p prostectomy in June 2013, DM , HLD, who presents to clinic for pre-travel counseling and vaccinations prior to going on  [address] to [place] including [place], [place], and [place] with his wife, roe wilner, (see itinerary in her clinic note).   He has extensive travel internationally for leisure as well as work. He has been immunized for hep A, and hep B. Last typhoid in 2001. He has recently received pneumococcal and shingles vaccines from PCP. Had flu vaccine last year.  Current Outpatient Prescriptions on File Prior to Visit  Medication Sig Dispense Refill  . aspirin 325 MG tablet Take 325 mg by mouth daily with breakfast.       . atorvastatin (LIPITOR) 10 MG tablet Take 10 mg by mouth daily with breakfast.       . hyoscyamine (LEVSIN SL) 0.125 MG SL tablet Place 1 tablet (0.125 mg total) under the tongue every 4 (four) hours as needed (Bladder spasms).  40 tablet  4  . metFORMIN (GLUCOPHAGE) 1000 MG tablet Take 1,000 mg by mouth 2 (two) times daily with a meal.      . Multiple Vitamin (MULTIVITAMIN) tablet Take 1 tablet by mouth daily.      [name] neomycin-bacitracin-polymyxin (NEOSPORIN) OINT Apply 1 application topically 3 (three) times daily. To tip of penis.  30 g  3  . omeprazole (PRILOSEC) 20 MG capsule Take 20 mg by mouth every morning.      [name] oxybutynin (DITROPAN) 5 MG tablet Take 1 tablet (5 mg total) by mouth every 6 (six) hours as needed (Bladder spasms).  40 tablet  4  . pseudoephedrine-acetaminophen (TYLENOL SINUS) 30-500 MG TABS Take 1 tablet by mouth daily.       [name] senna-docusate (SENOKOT-S) 8.6-50 MG per tablet Take 1 tablet by mouth 2 (two) times daily.  60 tablet  0   Active Ambulatory Problems    Diagnosis Date Noted  . Prostate ca 08/17/2011   Resolved  Ambulatory Problems    Diagnosis Date Noted  . No Resolved Ambulatory Problems   Past Medical History  Diagnosis Date  . Normal cardiac stress test 2010  . Prostate nodule   . Diabetes mellitus ORAL MED  . Hyperlipidemia   . GERD (gastroesophageal reflux disease)   . Impaired hearing BILATERAL HEARING AIDS  . Elevated PSA   . Skin cancer       Review of Systems In fine health today. No fever, chills, cold, diarrhea, or malaise    Objective:   Physical Exam  There were no vitals taken for this visit.       Assessment & Plan:  Pre-travel counseling -> patient and his wife have been counseled on traveler's diarrhea, food borne illness, mosquito bite prevention.  Will give rx for oral typhoid vaccinations, anticipatory guidelines given.  Health maintenance = recommended to get flu vaccine when it becomes available next month.  25 minutes have been spent counseling the patient regarding pre-travel health for his upcoming trip.

## 2012-01-12 ENCOUNTER — Encounter: Payer: Self-pay | Admitting: *Deleted

## 2012-08-27 ENCOUNTER — Encounter: Payer: Self-pay | Admitting: *Deleted

## 2013-02-27 ENCOUNTER — Ambulatory Visit (HOSPITAL_COMMUNITY): Payer: Medicare Other | Attending: Family Medicine

## 2013-02-27 DIAGNOSIS — E119 Type 2 diabetes mellitus without complications: Secondary | ICD-10-CM | POA: Insufficient documentation

## 2013-02-27 DIAGNOSIS — Z136 Encounter for screening for cardiovascular disorders: Secondary | ICD-10-CM

## 2013-02-27 DIAGNOSIS — E785 Hyperlipidemia, unspecified: Secondary | ICD-10-CM | POA: Insufficient documentation

## 2013-02-27 DIAGNOSIS — Z1389 Encounter for screening for other disorder: Secondary | ICD-10-CM | POA: Insufficient documentation

## 2013-02-27 DIAGNOSIS — F17200 Nicotine dependence, unspecified, uncomplicated: Secondary | ICD-10-CM | POA: Insufficient documentation

## 2013-12-24 ENCOUNTER — Encounter: Payer: Self-pay | Admitting: Radiation Oncology

## 2013-12-24 NOTE — Progress Notes (Signed)
GU Location of Tumor / Histology: prostate adenocarcinoma  If Prostate Cancer, Gleason Score is (4 + 5) and PSA is (0.08 on 12/17/13) 09/06/13 PSA 0.06 05/07/13 PSA 0.04 01/22/13  PSA  0.03 10/24/12  PSA  0.02  Frank Larsen presented 2 years ago with signs/symptoms of: Elevated PSA, history of 5 negative biopsies  Biopsies of  (if applicable) revealed:  5/62/56 Diagnosis 1. Lymph nodes, regional resection, right obturator - THERE IS NO EVIDENCE OF CARCINOMA IN 2 OF 2 LYMPH NODES (0/2). 2. Lymph nodes, regional resection, right internal iliac - THERE IS NO EVIDENCE OF CARCINOMA IN 4 OF 4 LYMPH NODES (0/4). 3. Lymph node, biopsy, right external - BENIGN FIBROADIPOSE TISSUE. - LYMPH NODAL TISSUE IS NOT IDENTIFIED. - THERE IS NO EVIDENCE OF MALIGNANCY. 4. Lymph node, biopsy, right common iliac - THERE IS NO EVIDENCE OF CARCINOMA IN 1 OF 1 LYMPH NODE (0/1). 5. Lymph node, biopsy, left obturator - THERE IS NO EVIDENCE OF CARCINOMA IN 1 OF 1 LYMPH NODE (0/1). 6. Lymph node, biopsy, left internal - BENIGN ADIPOSE TISSUE. - LYMPH NODAL TISSUE IS NOT IDENTIFIED. - THERE IS NO EVIDENCE OF MALIGNANCY. 7. Lymph node, biopsy, left external - THERE IS NO EVIDENCE OF CARCINOMA IN 1 OF 1 LYMPH NODE (0/1). 8. Lymph node, biopsy, left common iliac - THERE IS NO EVIDENCE OF CARCINOMA IN 1 OF 1 LYMPH NODE (0/1). 9. Soft tissue, biopsy, posterior margin of prostate - BENIGN VASCULAR STRUCTURES, ADIPOSE TISSUE AND NERVE. - THERE IS NO EVIDENCE OF MALIGNANCY. 10. Fatty tissue, peri-vessicle - THERE IS NO EVIDENCE OF CARCINOMA IN 2 OF 2 LYMPH NODES (0/2). - BENIGN ADIPOSE TISSUE, VASCULATURE AND NERVE. - THERE IS NO EVIDENCE OF MALIGNANCY. 11. Prostate, radical resection - PROSTATIC ADENOCARCINOMA, GLEASON'S SCORE 4+5=9/10, INVOLVING BOTH PROSTATE LOBES. - LYMPHOVASCULAR INVASION IS IDENTIFIED. - PERINEURAL INVASION IS IDENTIFIED. - THE SURGICAL RESECTION MARGINS APPEAR NEGATIVE FOR CARCINOMA. -  SEE ONCOLOGY TABLE BELOW.  Past/Anticipated interventions by urology, if any: 10/19/11  radical prostatectomy  Past/Anticipated interventions by medical oncology, if any: none  Weight changes, if any: no  Bowel/Bladder complaints, if any:  Slight issues with incomplete emptying, frequency, urgency. Nocturia x 1.  IPSS 4  Nausea/Vomiting, if any: no  Pain issues, if any:  no  SAFETY ISSUES:  Prior radiation? no  Pacemaker/ICD? no  Possible current pregnancy? na  Is the patient on methotrexate? no  Current Complaints / other details:  Married, 1 son, 1 daughter, retired, worked in [employer] fund[employer], exercises regularly PSA never 0 and rising S/p radical prostatectomy 2013.

## 2013-12-25 ENCOUNTER — Encounter: Payer: Self-pay | Admitting: Radiation Oncology

## 2013-12-25 ENCOUNTER — Ambulatory Visit
Admission: RE | Admit: 2013-12-25 | Discharge: 2013-12-25 | Disposition: A | Discharge status: Home / Self Care | Payer: Medicare Other | Source: Ambulatory Visit | Attending: Radiation Oncology | Admitting: Radiation Oncology

## 2013-12-25 VITALS — BP 146/84 | HR 63 | Temp 98.2°F | Resp 20 | Ht 72.0 in | Wt 193.4 lb

## 2013-12-25 DIAGNOSIS — Z510 Encounter for antineoplastic radiation therapy: Secondary | ICD-10-CM | POA: Diagnosis not present

## 2013-12-25 DIAGNOSIS — Z9079 Acquired absence of other genital organ(s): Secondary | ICD-10-CM | POA: Insufficient documentation

## 2013-12-25 DIAGNOSIS — C61 Malignant neoplasm of prostate: Secondary | ICD-10-CM | POA: Diagnosis not present

## 2013-12-25 HISTORY — DX: Malignant neoplasm of prostate: C61

## 2013-12-25 NOTE — Progress Notes (Signed)
CC: Dr. Phebe Colla, Dr. [name]  Followup note:  Diagnosis:  Pathologic stage pT2 N0 M0  high risk adenocarcinoma prostate  History: [name] is a pleasant 68 year old male who is seen today through the courtesy of Dr. Phebe Colla for discussion of possible salvage radiation therapy in the management of his high-risk carcinoma the prostate. I first saw the patient in consultation on 08/18/2011. He had high-grade PIN in April 2004 but no atypia. PSA was 8.2 at that time. His PSA has fluctuated and has been on finasteride since April 2006. His PSA in October 2010 was 6.0 and in January 2011 4.0 while on finasteride. On 05/20/2011 his PSA was 2.49 (corrected 4.96). However, Dr. Jasmine December noted a 0.8 cm nodule along the right mid gland. He underwent saturation biopsies on 07/20/2011 revealing Gleason 9 (5+4) involving 80% of one core from the right lateral base, 40% of one core from the right mid gland, 70% of one core from the right base, 50% of one core from the "right nodule"and not observe one core from the left lateral base. He also had Gleason 9 (4+5) involving 70% of one core from the right base. He Gleason 8 (5+3) involving 50% of one core from the right transitional zone, 30% of one core from the right central zone, and 40% of one core from the left mid medial gland. He Gleason 8 (3+5) involving 50% of one core from the right transitional is and 50% of one core from the right nodule and 50% of one core from left lateral apex. His gland volume was approximately 92 cc. A total of 36 biopsies were obtained. His staging workup included a CT scan of the abdomen/pelvis and bone scan which understand were without evidence for metastatic disease. A bone scan was also without evidence for metastatic disease. He elected for a radical prostatectomy performed by Dr. Jasmine December on 10/19/2011. His then have Gleason 9 (4+5) disease which was confined to the prostate. 12 lymph nodes were free of metastatic  disease. He was noted to have lymphovascular space invasion and also perineural invasion. He was given a pathologic stage of pT2c. His PSA in March 2014 was 0.01 rising to 0.03 by September 2014, 0.06 by May of 2015 and 0.08 on 12/17/2013. He is doing reasonably well from a GU and GI standpoint. He has minimal stress leakage. He remains quite active. He had a followup CT scan of the pelvis on 09/06/2013 which showed small stable bone islands with no evidence for metastatic/recurrent disease.  Physical examination: Alert and oriented.  Filed Vitals:   12/25/13 1530  BP: 146/84  Pulse: 63  Temp: 98.2 F (36.8 C)  Resp: 20   He is not examined today.  Laboratory data: PSA 0.08 from 12/17/2013  Impression: Pathologic Stage T2c N0 high risk adenocarcinoma prostate. I explained to the patient and his wife that he has either a local recurrence alone, distant recurrence alone, or both local and distant recurrences. Clinical predictors for local recurrence alone include a positive margin, disease-free interval, initial PSA of less than 10 and a Gleason score of less than or equal to 7. Statistically speaking he probably has occult metastatic disease rather than a local failure. We also discussed the fact that his PSA level may not reflect his actual cancer burden. I estimate that success with salvage radiation therapy in this setting is probably no greater than 15% in the absence of a positive margin and Gleason 9 disease. We discussed the potential acute  and late toxicities of radiation therapy. I gave him articles for review. He will go home and think things over and contact me if he would like to proceed with radiation therapy. He also understands that he may wait until his PSA is 0.2 before moving ahead with radiation therapy, although I feel very certain that he does have low volume microscopic cancer.  1 hour was spent face-to-face the patient, primarily counseling patient.

## 2013-12-25 NOTE — Progress Notes (Signed)
Please see the Nurse Progress Note in the MD Initial Consult Encounter for this patient. 

## 2013-12-30 ENCOUNTER — Encounter: Payer: Self-pay | Admitting: Radiation Oncology

## 2013-12-30 NOTE — Progress Notes (Signed)
CC: Dr. Phebe Colla  The patient called today and he wants to proceed with radiation therapy to his prostate bed. We'll get him in this week for his CT simulation.

## 2014-01-02 ENCOUNTER — Ambulatory Visit
Admission: RE | Admit: 2014-01-02 | Discharge: 2014-01-02 | Disposition: A | Discharge status: Home / Self Care | Payer: Medicare Other | Source: Ambulatory Visit | Attending: Radiation Oncology | Admitting: Radiation Oncology

## 2014-01-02 ENCOUNTER — Encounter: Payer: Self-pay | Admitting: Radiation Oncology

## 2014-01-02 DIAGNOSIS — Z510 Encounter for antineoplastic radiation therapy: Secondary | ICD-10-CM | POA: Diagnosis not present

## 2014-01-02 DIAGNOSIS — C61 Malignant neoplasm of prostate: Secondary | ICD-10-CM

## 2014-01-02 NOTE — Progress Notes (Signed)
Complex simulation/treatment planning note: The patient was taken to the CT simulator. A Vac lock immobilization device was constructed. A red rubber tube was placed within the rectal vault. He was then catheterized and contrast instilled into the bladder/urethra. He was then scanned. The CT data set was sent to the planning system ([facility]) where I contoured his CTV 66 (high risk prostate bed) and expanded this by 0.5 cm to create PTV 66. I also contoured his CTV 54.45 (low-risk prostate bed/nodes) and expanded this by 0.5 cm to create PTV 54.45. These 2 PTV's will receive 6600 cGy and 5445 cGy respectively in 33 sessions. I also contoured the bladder, rectum, and inferior rectosigmoid colon. I requested patient be treated with a comfortably full bladder. The patient now ready for Tomotherapy IMRT planning.

## 2014-01-07 NOTE — Addendum Note (Signed)
Encounter addended by: Andria Rhein, RN on: 01/07/2014  9:20 AM<BR>     Documentation filed: Charges VN

## 2014-01-09 ENCOUNTER — Encounter: Payer: Self-pay | Admitting: Radiation Oncology

## 2014-01-09 DIAGNOSIS — Z510 Encounter for antineoplastic radiation therapy: Secondary | ICD-10-CM | POA: Diagnosis not present

## 2014-01-09 NOTE — Progress Notes (Signed)
IMRT simulation/treatment planning note: The patient completed his IMRT planning for treatment to his prostate bed. IMRT was chosen to decrease the risk for both acute and late bladder and rectal toxicity compared to 3-D conformal or conventional radiation therapy. He is being treated with stool VMAT IMRT. Dose volume histograms were obtained for the target structures and also avoidance structures including the bladder, rectum, and femoral heads. We met our departmental guidelines. I prescribing 6600 cGy in 33 sessions to his prostate bed PTV.

## 2014-01-13 DIAGNOSIS — Z510 Encounter for antineoplastic radiation therapy: Secondary | ICD-10-CM | POA: Diagnosis not present

## 2014-01-14 ENCOUNTER — Encounter: Payer: Self-pay | Admitting: Radiation Oncology

## 2014-01-14 ENCOUNTER — Ambulatory Visit
Admission: RE | Admit: 2014-01-14 | Discharge: 2014-01-14 | Disposition: A | Discharge status: Home / Self Care | Payer: Medicare Other | Source: Ambulatory Visit | Attending: Radiation Oncology | Admitting: Radiation Oncology

## 2014-01-14 VITALS — BP 146/78 | HR 62 | Temp 98.0°F | Resp 20 | Wt 193.1 lb

## 2014-01-14 DIAGNOSIS — Z510 Encounter for antineoplastic radiation therapy: Secondary | ICD-10-CM | POA: Diagnosis not present

## 2014-01-14 DIAGNOSIS — C61 Malignant neoplasm of prostate: Secondary | ICD-10-CM

## 2014-01-14 NOTE — Progress Notes (Signed)
Patient denies pain, fatigue, loss of appetite, urinary/bowel issues.  Patient education completed with patient and wife. Gave pt "Radiation and You" booklet with all pertinent information marked and discussed, re: hair loss/care, fatigue, urinary irritation/care, rectal irritation/care, nutrition, pain.  Pt and wife verbalized understanding.

## 2014-01-14 NOTE — Progress Notes (Signed)
Weekly Management Note:  Site: Prostate bed Current Dose:  200  cGy Projected Dose: 6600  cGy  Narrative: The patient is seen today for routine under treatment assessment. CBCT/MVCT images/port films were reviewed. The chart was reviewed.   Bladder filling is excellent. No GU or GI difficulties. He underwent patient education today.  Physical Examination:  Filed Vitals:   01/14/14 1522  BP: 146/78  Pulse: 62  Temp: 98 F (36.7 C)  Resp: 20  .  Weight: 193 lb 1.6 oz (87.59 kg). No change .  Impression: Tolerating radiation therapy well.  Plan: Continue radiation therapy as planned.

## 2014-01-14 NOTE — Progress Notes (Signed)
Chart note: The patient began his IMRT today in the management of his PSA recurrent carcinoma the prostate. He is being treated with helical IMRT Tomotherapy and he is being treated to 8.2 delivered field widths corresponding to one set of IMRT treatment devices [phone]).

## 2014-01-15 ENCOUNTER — Ambulatory Visit
Admission: RE | Admit: 2014-01-15 | Discharge: 2014-01-15 | Disposition: A | Discharge status: Home / Self Care | Payer: Medicare Other | Source: Ambulatory Visit | Attending: Radiation Oncology | Admitting: Radiation Oncology

## 2014-01-15 DIAGNOSIS — Z510 Encounter for antineoplastic radiation therapy: Secondary | ICD-10-CM | POA: Diagnosis not present

## 2014-01-16 ENCOUNTER — Ambulatory Visit
Admission: RE | Admit: 2014-01-16 | Discharge: 2014-01-16 | Disposition: A | Discharge status: Home / Self Care | Payer: Medicare Other | Source: Ambulatory Visit | Attending: Radiation Oncology | Admitting: Radiation Oncology

## 2014-01-16 DIAGNOSIS — Z510 Encounter for antineoplastic radiation therapy: Secondary | ICD-10-CM | POA: Diagnosis not present

## 2014-01-17 ENCOUNTER — Ambulatory Visit
Admission: RE | Admit: 2014-01-17 | Discharge: 2014-01-17 | Disposition: A | Discharge status: Home / Self Care | Payer: Medicare Other | Source: Ambulatory Visit | Attending: Radiation Oncology | Admitting: Radiation Oncology

## 2014-01-17 DIAGNOSIS — Z510 Encounter for antineoplastic radiation therapy: Secondary | ICD-10-CM | POA: Diagnosis not present

## 2014-01-20 ENCOUNTER — Encounter: Payer: Self-pay | Admitting: Radiation Oncology

## 2014-01-20 ENCOUNTER — Ambulatory Visit
Admission: RE | Admit: 2014-01-20 | Discharge: 2014-01-20 | Disposition: A | Discharge status: Home / Self Care | Payer: Medicare Other | Source: Ambulatory Visit | Attending: Radiation Oncology | Admitting: Radiation Oncology

## 2014-01-20 VITALS — BP 154/80 | HR 65 | Temp 98.3°F | Resp 20 | Wt 193.5 lb

## 2014-01-20 DIAGNOSIS — C61 Malignant neoplasm of prostate: Secondary | ICD-10-CM

## 2014-01-20 DIAGNOSIS — Z510 Encounter for antineoplastic radiation therapy: Secondary | ICD-10-CM | POA: Diagnosis not present

## 2014-01-20 NOTE — Progress Notes (Addendum)
Patient denies pain, fatigue, loss of appetite, urinary issues. He states he has noticed his bowels are " a little soft but no diarrhea".

## 2014-01-20 NOTE — Progress Notes (Signed)
Weekly Management Note:  Site: Prostate bed Current Dose:  1000  cGy Projected Dose: 6600  cGy  Narrative: The patient is seen today for routine under treatment assessment. CBCT/MVCT images/port films were reviewed. The chart was reviewed.   Bladder filling is satisfactory. He reports soft stools but otherwise no GU or GI issues or difficulties.  Physical Examination:  Filed Vitals:   01/20/14 1045  BP: 154/80  Pulse: 65  Temp: 98.3 F (36.8 C)  Resp: 20  .  Weight: 193 lb 8 oz (87.771 kg). No change.  Impression: Tolerating radiation therapy well.  Plan: Continue radiation therapy as planned.

## 2014-01-21 ENCOUNTER — Ambulatory Visit
Admission: RE | Admit: 2014-01-21 | Discharge: 2014-01-21 | Disposition: A | Discharge status: Home / Self Care | Payer: Medicare Other | Source: Ambulatory Visit | Attending: Radiation Oncology | Admitting: Radiation Oncology

## 2014-01-21 DIAGNOSIS — Z510 Encounter for antineoplastic radiation therapy: Secondary | ICD-10-CM | POA: Diagnosis not present

## 2014-01-22 ENCOUNTER — Ambulatory Visit
Admission: RE | Admit: 2014-01-22 | Discharge: 2014-01-22 | Disposition: A | Discharge status: Home / Self Care | Payer: Medicare Other | Source: Ambulatory Visit | Attending: Radiation Oncology | Admitting: Radiation Oncology

## 2014-01-22 DIAGNOSIS — Z510 Encounter for antineoplastic radiation therapy: Secondary | ICD-10-CM | POA: Diagnosis not present

## 2014-01-23 ENCOUNTER — Ambulatory Visit
Admission: RE | Admit: 2014-01-23 | Discharge: 2014-01-23 | Disposition: A | Discharge status: Home / Self Care | Payer: Medicare Other | Source: Ambulatory Visit | Attending: Radiation Oncology | Admitting: Radiation Oncology

## 2014-01-23 DIAGNOSIS — Z510 Encounter for antineoplastic radiation therapy: Secondary | ICD-10-CM | POA: Diagnosis not present

## 2014-01-24 ENCOUNTER — Ambulatory Visit: Payer: Medicare Other

## 2014-01-27 ENCOUNTER — Ambulatory Visit: Payer: Medicare Other

## 2014-01-27 ENCOUNTER — Ambulatory Visit
Admission: RE | Admit: 2014-01-27 | Discharge: 2014-01-27 | Disposition: A | Discharge status: Home / Self Care | Payer: Medicare Other | Source: Ambulatory Visit | Attending: Radiation Oncology | Admitting: Radiation Oncology

## 2014-01-27 ENCOUNTER — Ambulatory Visit: Admission: RE | Admit: 2014-01-27 | Payer: Medicare Other | Source: Ambulatory Visit | Admitting: Radiation Oncology

## 2014-01-27 ENCOUNTER — Encounter: Payer: Self-pay | Admitting: Radiation Oncology

## 2014-01-27 VITALS — BP 138/76 | HR 62 | Temp 98.2°F | Resp 16 | Ht 72.0 in | Wt 191.1 lb

## 2014-01-27 DIAGNOSIS — Z510 Encounter for antineoplastic radiation therapy: Secondary | ICD-10-CM | POA: Diagnosis not present

## 2014-01-27 DIAGNOSIS — C61 Malignant neoplasm of prostate: Secondary | ICD-10-CM

## 2014-01-27 NOTE — Progress Notes (Signed)
Frank Larsen has completed 9/33 fractions to his prostate.  He denies pain, dysuria, hematuria and diarrhea.  He reports getting up once per night to urinate.  He reports using 1 pad per day for incontinence and says this may be improving.  He reports having softer stools.

## 2014-01-27 NOTE — Progress Notes (Signed)
Weekly Management Note:  Site: Prostate bed Current Dose:  1800  cGy Projected Dose: 6600  cGy  Narrative: The patient is seen today for routine under treatment assessment. CBCT/MVCT images/port films were reviewed. The chart was reviewed.   Bladder filling is satisfactory. No GU or GI difficulty.  Physical Examination:  Filed Vitals:   01/27/14 1832  BP: 138/76  Pulse: 62  Temp: 98.2 F (36.8 C)  Resp: 16  .  Weight: 191 lb 1.6 oz (86.682 kg). No change.  Impression: Tolerating radiation therapy well.  Plan: Continue radiation therapy as planned.

## 2014-01-28 ENCOUNTER — Ambulatory Visit
Admission: RE | Admit: 2014-01-28 | Discharge: 2014-01-28 | Disposition: A | Discharge status: Home / Self Care | Payer: Medicare Other | Source: Ambulatory Visit | Attending: Radiation Oncology | Admitting: Radiation Oncology

## 2014-01-28 DIAGNOSIS — Z510 Encounter for antineoplastic radiation therapy: Secondary | ICD-10-CM | POA: Diagnosis not present

## 2014-01-29 ENCOUNTER — Ambulatory Visit
Admission: RE | Admit: 2014-01-29 | Discharge: 2014-01-29 | Disposition: A | Discharge status: Home / Self Care | Payer: Medicare Other | Source: Ambulatory Visit | Attending: Radiation Oncology | Admitting: Radiation Oncology

## 2014-01-29 DIAGNOSIS — Z510 Encounter for antineoplastic radiation therapy: Secondary | ICD-10-CM | POA: Diagnosis not present

## 2014-01-30 ENCOUNTER — Ambulatory Visit
Admission: RE | Admit: 2014-01-30 | Discharge: 2014-01-30 | Disposition: A | Discharge status: Home / Self Care | Payer: Medicare Other | Source: Ambulatory Visit | Attending: Radiation Oncology | Admitting: Radiation Oncology

## 2014-01-30 DIAGNOSIS — Z510 Encounter for antineoplastic radiation therapy: Secondary | ICD-10-CM | POA: Diagnosis not present

## 2014-01-31 ENCOUNTER — Ambulatory Visit
Admission: RE | Admit: 2014-01-31 | Discharge: 2014-01-31 | Disposition: A | Discharge status: Home / Self Care | Payer: Medicare Other | Source: Ambulatory Visit | Attending: Radiation Oncology | Admitting: Radiation Oncology

## 2014-01-31 DIAGNOSIS — Z510 Encounter for antineoplastic radiation therapy: Secondary | ICD-10-CM | POA: Diagnosis not present

## 2014-02-03 ENCOUNTER — Encounter: Payer: Self-pay | Admitting: Radiation Oncology

## 2014-02-03 ENCOUNTER — Ambulatory Visit
Admission: RE | Admit: 2014-02-03 | Discharge: 2014-02-03 | Disposition: A | Discharge status: Home / Self Care | Payer: Medicare Other | Source: Ambulatory Visit | Attending: Radiation Oncology | Admitting: Radiation Oncology

## 2014-02-03 VITALS — BP 150/99 | HR 63 | Temp 98.9°F | Resp 20 | Wt 195.2 lb

## 2014-02-03 DIAGNOSIS — Z510 Encounter for antineoplastic radiation therapy: Secondary | ICD-10-CM | POA: Diagnosis not present

## 2014-02-03 DIAGNOSIS — C61 Malignant neoplasm of prostate: Secondary | ICD-10-CM

## 2014-02-03 NOTE — Progress Notes (Signed)
Weekly Management Note:  Site: Prostate bed/pelvic lymph nodes Current Dose:  2800  cGy Projected Dose: 6600  cGy  Narrative: The patient is seen today for routine under treatment assessment. CBCT/MVCT images/port films were reviewed. The chart was reviewed.   Bladder filling is suboptimal. The patient did not feel that his bladder was comfortably full. He's had some difficulty with frequent soft bowel movements and has taken Imodium. He was given information for a low residue diet.  Physical Examination:  Filed Vitals:   02/03/14 0916  BP: 150/99  Pulse: 63  Temp: 98.9 F (37.2 C)  Resp: 20  .  Weight: 195 lb 3.2 oz (88.542 kg). No change.  Impression: Tolerating radiation therapy well. He is to improve his bladder filling.  Plan: Continue radiation therapy as planned.

## 2014-02-03 NOTE — Progress Notes (Addendum)
Patient denies pain, fatigue, loss of appetite, urinary issues. He reports freq soft stools but denies diarrhea. He has Imodium at home and understands to take if he develops diarrhea. Gave pt Low Residue information/diet. Patient states his BP may be high due to prescription nasal spray he used this morning and being concerned about a friend in the hospital.

## 2014-02-04 ENCOUNTER — Ambulatory Visit
Admission: RE | Admit: 2014-02-04 | Discharge: 2014-02-04 | Disposition: A | Discharge status: Home / Self Care | Payer: Medicare Other | Source: Ambulatory Visit | Attending: Radiation Oncology | Admitting: Radiation Oncology

## 2014-02-04 DIAGNOSIS — Z510 Encounter for antineoplastic radiation therapy: Secondary | ICD-10-CM | POA: Diagnosis not present

## 2014-02-05 ENCOUNTER — Ambulatory Visit
Admission: RE | Admit: 2014-02-05 | Discharge: 2014-02-05 | Disposition: A | Discharge status: Home / Self Care | Payer: Medicare Other | Source: Ambulatory Visit | Attending: Radiation Oncology | Admitting: Radiation Oncology

## 2014-02-05 DIAGNOSIS — Z510 Encounter for antineoplastic radiation therapy: Secondary | ICD-10-CM | POA: Diagnosis not present

## 2014-02-06 ENCOUNTER — Ambulatory Visit
Admission: RE | Admit: 2014-02-06 | Discharge: 2014-02-06 | Disposition: A | Discharge status: Home / Self Care | Payer: Medicare Other | Source: Ambulatory Visit | Attending: Radiation Oncology | Admitting: Radiation Oncology

## 2014-02-06 DIAGNOSIS — Z510 Encounter for antineoplastic radiation therapy: Secondary | ICD-10-CM | POA: Diagnosis present

## 2014-02-06 DIAGNOSIS — C61 Malignant neoplasm of prostate: Secondary | ICD-10-CM | POA: Diagnosis not present

## 2014-02-07 ENCOUNTER — Ambulatory Visit
Admission: RE | Admit: 2014-02-07 | Discharge: 2014-02-07 | Disposition: A | Discharge status: Home / Self Care | Payer: Medicare Other | Source: Ambulatory Visit | Attending: Radiation Oncology | Admitting: Radiation Oncology

## 2014-02-07 DIAGNOSIS — Z510 Encounter for antineoplastic radiation therapy: Secondary | ICD-10-CM | POA: Diagnosis not present

## 2014-02-10 ENCOUNTER — Ambulatory Visit
Admission: RE | Admit: 2014-02-10 | Discharge: 2014-02-10 | Disposition: A | Discharge status: Home / Self Care | Payer: Medicare Other | Source: Ambulatory Visit | Attending: Radiation Oncology | Admitting: Radiation Oncology

## 2014-02-10 VITALS — BP 146/72 | HR 58 | Temp 98.3°F | Resp 20 | Wt 193.3 lb

## 2014-02-10 DIAGNOSIS — Z510 Encounter for antineoplastic radiation therapy: Secondary | ICD-10-CM | POA: Diagnosis not present

## 2014-02-10 DIAGNOSIS — C61 Malignant neoplasm of prostate: Secondary | ICD-10-CM

## 2014-02-10 NOTE — Progress Notes (Signed)
Patient denies pain, fatigue, loss of appetite, urinary issues. He states last Mon he took Imodium x 1 tab for diarrhea and was unable to have a BM x 3 days. He states he took Imodium x 1 tab yesterday due to diarrhea returning. He states he is limiting eating fiber because he tends more toward diarrhea than constipation.

## 2014-02-10 NOTE — Progress Notes (Signed)
Weekly Management Note:  Site: Prostate bed/pelvic lymph nodes Current Dose:  3800  cGy Projected Dose: 6600  cGy  Narrative: The patient is seen today for routine under treatment assessment. CBCT/MVCT images/port films were reviewed. The chart was reviewed.   Bladder filling is excellent today. He's had intermittent diarrhea and constipation after taking one dose of Imodium. No GU difficulties.  Physical Examination:  Filed Vitals:   02/10/14 0927  BP: 146/72  Pulse: 58  Temp: 98.3 F (36.8 C)  Resp: 20  .  Weight: 193 lb 4.8 oz (87.68 kg). No change.  Impression: Tolerating radiation therapy well.  Plan: Continue radiation therapy as planned.

## 2014-02-11 ENCOUNTER — Ambulatory Visit: Payer: Medicare Other

## 2014-02-12 ENCOUNTER — Ambulatory Visit
Admission: RE | Admit: 2014-02-12 | Discharge: 2014-02-12 | Disposition: A | Discharge status: Home / Self Care | Payer: Medicare Other | Source: Ambulatory Visit | Attending: Radiation Oncology | Admitting: Radiation Oncology

## 2014-02-12 DIAGNOSIS — Z510 Encounter for antineoplastic radiation therapy: Secondary | ICD-10-CM | POA: Diagnosis not present

## 2014-02-13 ENCOUNTER — Ambulatory Visit
Admission: RE | Admit: 2014-02-13 | Discharge: 2014-02-13 | Disposition: A | Discharge status: Home / Self Care | Payer: Medicare Other | Source: Ambulatory Visit | Attending: Radiation Oncology | Admitting: Radiation Oncology

## 2014-02-13 DIAGNOSIS — Z510 Encounter for antineoplastic radiation therapy: Secondary | ICD-10-CM | POA: Diagnosis not present

## 2014-02-14 ENCOUNTER — Ambulatory Visit
Admission: RE | Admit: 2014-02-14 | Discharge: 2014-02-14 | Disposition: A | Discharge status: Home / Self Care | Payer: Medicare Other | Source: Ambulatory Visit | Attending: Radiation Oncology | Admitting: Radiation Oncology

## 2014-02-14 DIAGNOSIS — Z510 Encounter for antineoplastic radiation therapy: Secondary | ICD-10-CM | POA: Diagnosis not present

## 2014-02-17 ENCOUNTER — Encounter: Payer: Self-pay | Admitting: Radiation Oncology

## 2014-02-17 ENCOUNTER — Ambulatory Visit
Admission: RE | Admit: 2014-02-17 | Discharge: 2014-02-17 | Disposition: A | Discharge status: Home / Self Care | Payer: Medicare Other | Source: Ambulatory Visit | Attending: Radiation Oncology | Admitting: Radiation Oncology

## 2014-02-17 VITALS — BP 153/74 | HR 58 | Temp 97.8°F | Resp 20 | Wt 192.7 lb

## 2014-02-17 DIAGNOSIS — C61 Malignant neoplasm of prostate: Secondary | ICD-10-CM

## 2014-02-17 DIAGNOSIS — Z510 Encounter for antineoplastic radiation therapy: Secondary | ICD-10-CM | POA: Diagnosis not present

## 2014-02-17 NOTE — Progress Notes (Signed)
Patient denies pain, fatigue, loss of appetite, diarrhea/bowel issues, urinary issues.

## 2014-02-17 NOTE — Progress Notes (Signed)
Weekly Management Note:  Site: Prostate bed/pelvic lymph nodes Current Dose:  4600  cGy Projected Dose: 6600  cGy  Narrative: The patient is seen today for routine under treatment assessment. CBCT/MVCT images/port films were reviewed. The chart was reviewed.   Bladder filling satisfactory. No GU or GI difficulties. He played golf this past Friday.  Physical Examination:  Filed Vitals:   02/17/14 0852  BP: 153/74  Pulse: 58  Temp: 97.8 F (36.6 C)  Resp: 20  .  Weight: 192 lb 11.2 oz (87.408 kg). No change.  Impression: Tolerating radiation therapy well.  Plan: Continue radiation therapy as planned.

## 2014-02-18 ENCOUNTER — Ambulatory Visit
Admission: RE | Admit: 2014-02-18 | Discharge: 2014-02-18 | Disposition: A | Discharge status: Home / Self Care | Payer: Medicare Other | Source: Ambulatory Visit | Attending: Radiation Oncology | Admitting: Radiation Oncology

## 2014-02-18 DIAGNOSIS — Z510 Encounter for antineoplastic radiation therapy: Secondary | ICD-10-CM | POA: Diagnosis not present

## 2014-02-19 ENCOUNTER — Ambulatory Visit
Admission: RE | Admit: 2014-02-19 | Discharge: 2014-02-19 | Disposition: A | Discharge status: Home / Self Care | Payer: Medicare Other | Source: Ambulatory Visit | Attending: Radiation Oncology | Admitting: Radiation Oncology

## 2014-02-19 DIAGNOSIS — Z510 Encounter for antineoplastic radiation therapy: Secondary | ICD-10-CM | POA: Diagnosis not present

## 2014-02-20 ENCOUNTER — Ambulatory Visit
Admission: RE | Admit: 2014-02-20 | Discharge: 2014-02-20 | Disposition: A | Discharge status: Home / Self Care | Payer: Medicare Other | Source: Ambulatory Visit | Attending: Radiation Oncology | Admitting: Radiation Oncology

## 2014-02-20 DIAGNOSIS — Z510 Encounter for antineoplastic radiation therapy: Secondary | ICD-10-CM | POA: Diagnosis not present

## 2014-02-21 ENCOUNTER — Ambulatory Visit
Admission: RE | Admit: 2014-02-21 | Discharge: 2014-02-21 | Disposition: A | Discharge status: Home / Self Care | Payer: Medicare Other | Source: Ambulatory Visit | Attending: Radiation Oncology | Admitting: Radiation Oncology

## 2014-02-21 DIAGNOSIS — Z510 Encounter for antineoplastic radiation therapy: Secondary | ICD-10-CM | POA: Diagnosis not present

## 2014-02-24 ENCOUNTER — Ambulatory Visit
Admission: RE | Admit: 2014-02-24 | Discharge: 2014-02-24 | Disposition: A | Discharge status: Home / Self Care | Payer: Medicare Other | Source: Ambulatory Visit | Attending: Radiation Oncology | Admitting: Radiation Oncology

## 2014-02-24 ENCOUNTER — Encounter: Payer: Self-pay | Admitting: Radiation Oncology

## 2014-02-24 VITALS — BP 160/74 | HR 61 | Temp 97.7°F | Resp 20 | Wt 193.7 lb

## 2014-02-24 DIAGNOSIS — C61 Malignant neoplasm of prostate: Secondary | ICD-10-CM

## 2014-02-24 DIAGNOSIS — Z510 Encounter for antineoplastic radiation therapy: Secondary | ICD-10-CM | POA: Diagnosis not present

## 2014-02-24 NOTE — Progress Notes (Signed)
Patient denies pain, fatigue, loss of appetite, urinary issues. He states he developed mild rectal pain with bms last Fri but it has resolved today.

## 2014-02-24 NOTE — Progress Notes (Signed)
Weekly Management Note:  Site: Prostate/pelvic lymph nodes Current Dose:  5600  cGy Projected Dose: 6600  cGy  Narrative: The patient is seen today for routine under treatment assessment. CBCT/MVCT images/port films were reviewed. The chart was reviewed.   Bladder filling is satisfactory. No GU or GI difficulties except for an episode of rectal discomfort this past weekend. He is asymptomatic today. No diarrhea.  Physical Examination:  Filed Vitals:   02/24/14 0818  BP: 160/74  Pulse: 61  Temp: 97.7 F (36.5 C)  Resp: 20  .  Weight: 193 lb 11.2 oz (87.862 kg). No change  Impression: Tolerating radiation therapy well. He will finish his treatment in one week.  Plan: Continue radiation therapy as planned.

## 2014-02-25 ENCOUNTER — Ambulatory Visit: Payer: Medicare Other

## 2014-02-26 ENCOUNTER — Ambulatory Visit
Admission: RE | Admit: 2014-02-26 | Discharge: 2014-02-26 | Disposition: A | Discharge status: Home / Self Care | Payer: Medicare Other | Source: Ambulatory Visit | Attending: Radiation Oncology | Admitting: Radiation Oncology

## 2014-02-26 DIAGNOSIS — Z510 Encounter for antineoplastic radiation therapy: Secondary | ICD-10-CM | POA: Diagnosis not present

## 2014-02-27 ENCOUNTER — Ambulatory Visit
Admission: RE | Admit: 2014-02-27 | Discharge: 2014-02-27 | Disposition: A | Discharge status: Home / Self Care | Payer: Medicare Other | Source: Ambulatory Visit | Attending: Radiation Oncology | Admitting: Radiation Oncology

## 2014-02-27 ENCOUNTER — Ambulatory Visit: Payer: Medicare Other

## 2014-02-27 DIAGNOSIS — Z510 Encounter for antineoplastic radiation therapy: Secondary | ICD-10-CM | POA: Diagnosis not present

## 2014-02-28 ENCOUNTER — Ambulatory Visit
Admission: RE | Admit: 2014-02-28 | Discharge: 2014-02-28 | Disposition: A | Discharge status: Home / Self Care | Payer: Medicare Other | Source: Ambulatory Visit | Attending: Radiation Oncology | Admitting: Radiation Oncology

## 2014-02-28 DIAGNOSIS — Z510 Encounter for antineoplastic radiation therapy: Secondary | ICD-10-CM | POA: Diagnosis not present

## 2014-03-03 ENCOUNTER — Encounter: Payer: Self-pay | Admitting: Radiation Oncology

## 2014-03-03 ENCOUNTER — Ambulatory Visit
Admission: RE | Admit: 2014-03-03 | Discharge: 2014-03-03 | Disposition: A | Discharge status: Home / Self Care | Payer: Medicare Other | Source: Ambulatory Visit | Attending: Radiation Oncology | Admitting: Radiation Oncology

## 2014-03-03 ENCOUNTER — Ambulatory Visit: Payer: Medicare Other

## 2014-03-03 VITALS — BP 140/76 | HR 61 | Temp 98.6°F | Resp 20 | Wt 190.5 lb

## 2014-03-03 DIAGNOSIS — Z510 Encounter for antineoplastic radiation therapy: Secondary | ICD-10-CM | POA: Diagnosis not present

## 2014-03-03 DIAGNOSIS — C61 Malignant neoplasm of prostate: Secondary | ICD-10-CM

## 2014-03-03 NOTE — Progress Notes (Signed)
Weekly Management Note:  Site: Prostate bed Current Dose:  6400  cGy Projected Dose: 6600  cGy  Narrative: The patient is seen today for routine under treatment assessment. CBCT/MVCT images/port films were reviewed. The chart was reviewed.   Bladder filling satisfactory. No GU or GI difficulty.  Physical Examination:  Filed Vitals:   03/03/14 0833  BP: 140/76  Pulse: 61  Temp: 98.6 F (37 C)  Resp: 20  .  Weight: 190 lb 8 oz (86.41 kg). No change.  Impression: Tolerating radiation therapy well. He will finish his radiation therapy tomorrow.  Plan: Continue radiation therapy as planned.

## 2014-03-03 NOTE — Progress Notes (Addendum)
Patient will complete treatment tomorrow, has FU card. He denies pain, urinary/bowel issues, fatigue, loss of appetite. He states he has had no more rectal pain.

## 2014-03-04 ENCOUNTER — Ambulatory Visit: Payer: Medicare Other

## 2014-03-04 ENCOUNTER — Ambulatory Visit
Admission: RE | Admit: 2014-03-04 | Discharge: 2014-03-04 | Disposition: A | Discharge status: Home / Self Care | Payer: Medicare Other | Source: Ambulatory Visit | Attending: Radiation Oncology | Admitting: Radiation Oncology

## 2014-03-04 DIAGNOSIS — Z510 Encounter for antineoplastic radiation therapy: Secondary | ICD-10-CM | POA: Diagnosis not present

## 2014-03-05 ENCOUNTER — Encounter: Payer: Self-pay | Admitting: Radiation Oncology

## 2014-03-05 NOTE — Progress Notes (Signed)
[facility] End of Treatment Note  Name:Frank Larsen  Date: 03/05/2014 GPQ:[id number] DOB:Aug 23, 1945   Status:outpatient    CC: Dr. [name],   Dr. Phebe Colla  REFERRING PHYSICIAN: Dr. Phebe Colla   DIAGNOSIS: PSA recurrent carcinoma the prostate  INDICATION FOR TREATMENT: Curative   TREATMENT DATES: 01/11/2014 through 03/04/2014                          SITE/DOSE:    Prostate bed 6600 cGy in 33 sessions                        BEAMS/ENERGY:  6 MV photons, helical IMRT                NARRATIVE:  [name] tolerated his treatment well with no significant GU or GI toxicity during his course of therapy.                          PLAN: Routine followup in one month. Patient instructed to call if questions or worsening complaints in interim.

## 2014-03-17 ENCOUNTER — Telehealth: Payer: Self-pay | Admitting: Radiation Oncology

## 2014-03-17 NOTE — Telephone Encounter (Signed)
Patient completed radiation therapy a little more than a week ago. Today he complains of pain associated with bowel movements. He explains this pain ends once he has finished his bowel movement. Denies blood in stool. Encouraged patient to pick up OTC preparation H suppository for relief. Encouraged patient to contact staff if no relief is obtained within 48 hours. Patient verbalized understanding.

## 2014-04-07 ENCOUNTER — Encounter: Payer: Self-pay | Admitting: *Deleted

## 2014-04-08 ENCOUNTER — Encounter: Payer: Self-pay | Admitting: Radiation Oncology

## 2014-04-08 ENCOUNTER — Ambulatory Visit
Admission: RE | Admit: 2014-04-08 | Discharge: 2014-04-08 | Disposition: A | Discharge status: Home / Self Care | Payer: Medicare Other | Source: Ambulatory Visit | Attending: Radiation Oncology | Admitting: Radiation Oncology

## 2014-04-08 VITALS — BP 151/70 | HR 70 | Temp 98.1°F | Resp 20 | Wt 195.9 lb

## 2014-04-08 DIAGNOSIS — C61 Malignant neoplasm of prostate: Secondary | ICD-10-CM

## 2014-04-08 NOTE — Progress Notes (Signed)
Patient denies pain, urinary/bowel issues, fatigue, loss of appetite. He has follow up with Dr Tresa Moore in 1-2 months.

## 2014-04-08 NOTE — Progress Notes (Signed)
CC: Dr. Phebe Colla  Follow-up note:  Frank Larsen returns today approximately 1 month following completion of salvage radiation therapy in the management of his PSA recurrent carcinoma the prostate.  Following his treatment he developed a brief episode of rectal discomfort which has since resolved.  He is not having any significant GU or GI difficulty.  He tells me he will see Dr. Tresa Moore in 1-2 months for a follow-up visit.  Physical examination: Alert and oriented. Filed Vitals:   04/08/14 0946  BP: 151/70  Pulse: 70  Temp: 98.1 F (36.7 C)  Resp: 20   Rectal examination not performed today.  Impression: Satisfactory progress.  Plan: He will see Dr. Tresa Moore for a follow-up visit in 1-2 months.  I've not scheduled Frank Larsen for a formal follow-up visit and I ask that Dr. Tresa Moore keep me posted on his progress.

## 2014-06-16 ENCOUNTER — Other Ambulatory Visit: Payer: Self-pay | Admitting: Dermatology

## 2014-11-06 ENCOUNTER — Ambulatory Visit
Admission: RE | Admit: 2014-11-06 | Discharge: 2014-11-06 | Disposition: A | Discharge status: Home / Self Care | Payer: Medicare Other | Source: Ambulatory Visit | Attending: Family Medicine | Admitting: Family Medicine

## 2014-11-06 ENCOUNTER — Other Ambulatory Visit: Payer: Self-pay | Admitting: Family Medicine

## 2014-11-06 DIAGNOSIS — M542 Cervicalgia: Secondary | ICD-10-CM

## 2015-06-02 DIAGNOSIS — E119 Type 2 diabetes mellitus without complications: Secondary | ICD-10-CM | POA: Diagnosis not present

## 2015-06-02 DIAGNOSIS — H31001 Unspecified chorioretinal scars, right eye: Secondary | ICD-10-CM | POA: Diagnosis not present

## 2015-06-02 LAB — HM DIABETES EYE EXAM

## 2015-06-25 DIAGNOSIS — L570 Actinic keratosis: Secondary | ICD-10-CM | POA: Diagnosis not present

## 2015-06-25 DIAGNOSIS — D2271 Melanocytic nevi of right lower limb, including hip: Secondary | ICD-10-CM | POA: Diagnosis not present

## 2015-06-25 DIAGNOSIS — Z87898 Personal history of other specified conditions: Secondary | ICD-10-CM | POA: Diagnosis not present

## 2015-06-25 DIAGNOSIS — D225 Melanocytic nevi of trunk: Secondary | ICD-10-CM | POA: Diagnosis not present

## 2015-06-25 DIAGNOSIS — Z85820 Personal history of malignant melanoma of skin: Secondary | ICD-10-CM | POA: Diagnosis not present

## 2015-06-25 DIAGNOSIS — Z23 Encounter for immunization: Secondary | ICD-10-CM | POA: Diagnosis not present

## 2015-08-12 DIAGNOSIS — C61 Malignant neoplasm of prostate: Secondary | ICD-10-CM | POA: Diagnosis not present

## 2015-08-20 DIAGNOSIS — N5201 Erectile dysfunction due to arterial insufficiency: Secondary | ICD-10-CM | POA: Diagnosis not present

## 2015-08-20 DIAGNOSIS — Z0000 Encounter for general adult medical examination without abnormal findings: Secondary | ICD-10-CM | POA: Diagnosis not present

## 2015-08-20 DIAGNOSIS — N393 Stress incontinence (female) (male): Secondary | ICD-10-CM | POA: Diagnosis not present

## 2015-08-20 DIAGNOSIS — C61 Malignant neoplasm of prostate: Secondary | ICD-10-CM | POA: Diagnosis not present

## 2015-08-24 DIAGNOSIS — M5432 Sciatica, left side: Secondary | ICD-10-CM | POA: Diagnosis not present

## 2015-08-24 DIAGNOSIS — Z85820 Personal history of malignant melanoma of skin: Secondary | ICD-10-CM | POA: Diagnosis not present

## 2015-08-24 DIAGNOSIS — Z7984 Long term (current) use of oral hypoglycemic drugs: Secondary | ICD-10-CM | POA: Diagnosis not present

## 2015-08-24 DIAGNOSIS — C61 Malignant neoplasm of prostate: Secondary | ICD-10-CM | POA: Diagnosis not present

## 2015-08-24 DIAGNOSIS — E1165 Type 2 diabetes mellitus with hyperglycemia: Secondary | ICD-10-CM | POA: Diagnosis not present

## 2015-08-24 DIAGNOSIS — K219 Gastro-esophageal reflux disease without esophagitis: Secondary | ICD-10-CM | POA: Diagnosis not present

## 2015-08-24 DIAGNOSIS — E782 Mixed hyperlipidemia: Secondary | ICD-10-CM | POA: Diagnosis not present

## 2015-08-24 LAB — HEMOGLOBIN A1C: HEMOGLOBIN A1C: 8.2

## 2015-08-24 LAB — BASIC METABOLIC PANEL: Glucose: 155 mg/dL

## 2015-10-27 ENCOUNTER — Encounter: Payer: Self-pay | Admitting: Internal Medicine

## 2015-10-27 DIAGNOSIS — N400 Enlarged prostate without lower urinary tract symptoms: Secondary | ICD-10-CM | POA: Insufficient documentation

## 2015-10-27 DIAGNOSIS — E7800 Pure hypercholesterolemia, unspecified: Secondary | ICD-10-CM | POA: Insufficient documentation

## 2015-10-29 ENCOUNTER — Ambulatory Visit (INDEPENDENT_AMBULATORY_CARE_PROVIDER_SITE_OTHER): Payer: Medicare Other | Admitting: Internal Medicine

## 2015-10-29 ENCOUNTER — Encounter: Payer: Self-pay | Admitting: Internal Medicine

## 2015-10-29 VITALS — BP 150/80 | HR 70 | Ht 70.0 in | Wt 193.0 lb

## 2015-10-29 DIAGNOSIS — E1165 Type 2 diabetes mellitus with hyperglycemia: Secondary | ICD-10-CM | POA: Diagnosis not present

## 2015-10-29 DIAGNOSIS — E1142 Type 2 diabetes mellitus with diabetic polyneuropathy: Secondary | ICD-10-CM

## 2015-10-29 MED ORDER — ACCU-CHEK SOFTCLIX LANCETS MISC: Status: DC

## 2015-10-29 MED ORDER — GLIPIZIDE ER 5 MG PO TB24: 5.0000 mg | ORAL_TABLET | Freq: Every day | ORAL | Status: DC

## 2015-10-29 MED ORDER — GLUCOSE BLOOD VI STRP: ORAL_STRIP | Status: DC

## 2015-10-29 NOTE — Patient Instructions (Signed)
Please continue: - Metformin 1000 mg 2x a day, with meals - Januvia 100 mg daily in a.m. - Actos 30 mg daily in a.m.  Please add: - Glipizide ER 5 mg daily in am, before b'fast  Please let me know if the sugars are consistently <80 or >200.  Please return in 1.5 months with your sugar log.   PATIENT INSTRUCTIONS FOR TYPE 2 DIABETES:  **Please join MyChart!** - see attached instructions about how to join if you have not done so already.  DIET AND EXERCISE Diet and exercise is an important part of diabetic treatment.  We recommended aerobic exercise in the form of brisk walking (working between 40-60% of maximal aerobic capacity, similar to brisk walking) for 150 minutes per week (such as 30 minutes five days per week) along with 3 times per week performing 'resistance' training (using various gauge rubber tubes with handles) 5-10 exercises involving the major muscle groups (upper body, lower body and core) performing 10-15 repetitions (or near fatigue) each exercise. Start at half the above goal but build slowly to reach the above goals. If limited by weight, joint pain, or disability, we recommend daily walking in a swimming pool with water up to waist to reduce pressure from joints while allow for adequate exercise.    BLOOD GLUCOSES Monitoring your blood glucoses is important for continued management of your diabetes. Please check your blood glucoses 2-4 times a day: fasting, before meals and at bedtime (you can rotate these measurements - e.g. one day check before the 3 meals, the next day check before 2 of the meals and before bedtime, etc.).   HYPOGLYCEMIA (low blood sugar) Hypoglycemia is usually a reaction to not eating, exercising, or taking too much insulin/ other diabetes drugs.  Symptoms include tremors, sweating, hunger, confusion, headache, etc. Treat IMMEDIATELY with 15 grams of Carbs: . 4 glucose tablets .  cup regular juice/soda . 2 tablespoons raisins . 4 teaspoons  sugar . 1 tablespoon honey Recheck blood glucose in 15 mins and repeat above if still symptomatic/blood glucose <100.  RECOMMENDATIONS TO REDUCE YOUR RISK OF DIABETIC COMPLICATIONS: * Take your prescribed MEDICATION(S) * Follow a DIABETIC diet: Complex carbs, fiber rich foods, (monounsaturated and polyunsaturated) fats * AVOID saturated/trans fats, high fat foods, >2,300 mg salt per day. * EXERCISE at least 5 times a week for 30 minutes or preferably daily.  * DO NOT SMOKE OR DRINK more than 1 drink a day. * Check your FEET every day. Do not wear tightfitting shoes. Contact us if you develop an ulcer * See your EYE doctor once a year or more if needed * Get a FLU shot once a year * Get a PNEUMONIA vaccine once before and once after age 80 years  GOALS:  * Your Hemoglobin A1c of <7%  * fasting sugars need to be <130 * after meals sugars need to be <180 (2h after you start eating) * Your Systolic BP should be [id number] or lower  * Your Diastolic BP should be 80 or lower  * Your HDL (Good Cholesterol) should be 40 or higher  * Your LDL (Bad Cholesterol) should be 100 or lower. * Your Triglycerides should be 150 or lower  * Your Urine microalbumin (kidney function) should be <30 * Your Body Mass Index should be 25 or lower    Please consider the following ways to cut down carbs and fat and increase fiber and micronutrients in your diet: - substitute whole grain for white bread or  pasta - substitute brown rice for white rice - substitute 90-calorie flat bread pieces for slices of bread when possible - substitute sweet potatoes or yams for white potatoes - substitute humus for margarine - substitute tofu for cheese when possible - substitute almond or rice milk for regular milk (would not drink soy milk daily due to concern for soy estrogen influence on breast cancer risk) - substitute dark chocolate for other sweets when possible - substitute water - can add lemon or orange slices for taste  - for diet sodas (artificial sweeteners will trick your body that you can eat sweets without getting calories and will lead you to overeating and weight gain in the long run) - do not skip breakfast or other meals (this will slow down the metabolism and will result in more weight gain over time)  - can try smoothies made from fruit and almond/rice milk in am instead of regular breakfast - can also try old-fashioned (not instant) oatmeal made with almond/rice milk in am - order the dressing on the side when eating salad at a restaurant (pour less than half of the dressing on the salad) - eat as little meat as possible - can try juicing, but should not forget that juicing will get rid of the fiber, so would alternate with eating raw veg./fruits or drinking smoothies - use as little oil as possible, even when using olive oil - can dress a salad with a mix of balsamic vinegar and lemon juice, for e.g. - use agave nectar, stevia sugar, or regular sugar rather than artificial sweateners - steam or broil/roast veggies  - snack on veggies/fruit/nuts (unsalted, preferably) when possible, rather than processed foods - reduce or eliminate aspartame in diet (it is in diet sodas, chewing gum, etc) Read the labels!  Try to read Dr. Janene Harvey book: "Program for Reversing Diabetes" for other ideas for healthy eating.

## 2015-10-29 NOTE — Progress Notes (Signed)
Patient ID: Frank Larsen, male   DOB: [birth year], 70 y.o.   MRN: [id number]  HPI: Frank Larsen is a 70 y.o.-year-old male, referred by his PCP, Dr. Moreen Fowler, for management of DM2, dx in 2013, non-insulin-dependent, uncontrolled, with complications (PN). He saw Dr. Buddy Duty in the past.  Last hemoglobin A1c was: Lab Results  Component Value Date   HGBA1C 8.2 08/24/2015  02/17/2015: 7.7% 10/2014: 7.6%  Pt is on a regimen of: - Metformin 1000 mg 2x a day, with meals - Januvia 100 mg daily in a.m. - Actos 30 mg daily in a.m. - added 1 year ago  Pt does not check sugars at home. Checks 1x q 6 weeks: 160-190. - am: n/c - 2h after b'fast: n/c - before lunch: n/c - 2h after lunch: n/c - before dinner: n/c - 2h after dinner: n/c - bedtime: n/c - nighttime: n/c No lows. Lowest sugar was 119; he has hypoglycemia awareness at 70.  Highest sugar was 204.  Glucometer: AccuChek - old  Pt's meals are: - Breakfast: eggs, toast + PB, pancakes + waffles - Lunch: sandwich - Dinner: chicken + veggies + starch or salad - Snacks: popcorn, pretzels, cookies, chocolate  - no CKD, last BUN/creatinine:  08/24/2015: 14/1.01, EGFR 73 Lab Results  Component Value Date   BUN 10 10/20/2011   BUN 14 10/19/2011   CREATININE 0.93 10/20/2011   CREATININE 0.92 10/19/2011  He had increased microalbumin in the past and started losartan on 02/17/2015. - last set of lipids: 08/24/2015: 141/134/41/74  He is on atorvastatin. - last eye exam was 06/02/2015. No DR.  - no numbness, but has tingling in his feet. + foot exam in 08/2015 >> normal sensation to manofilament  Pt has FH of DM in PGF - prediabetes. Brother   He also has a h/o PrCA (had surgery and radiation) and Melanoma. He also has HTN, HL.  ROS: Constitutional: no weight gain/loss, + fatigue, no subjective hyperthermia/hypothermia, + nocturia 3-5x a night Eyes: no blurry vision, no xerophthalmia ENT: no sore throat, no nodules palpated in  throat, no dysphagia/odynophagia, no hoarseness, + tinnitus, + hypoacusis Cardiovascular: no CP/SOB/palpitations/leg swelling Respiratory: no cough/SOB Gastrointestinal: no N/V/D/C Musculoskeletal: no muscle/joint aches Skin: no rashes Neurological: no tremors/numbness/tingling/dizziness Psychiatric: no depression/anxiety  Past Medical History  Diagnosis Date  . Normal cardiac stress test 2010  . Prostate nodule   . Hyperlipidemia   . GERD (gastroesophageal reflux disease)   . Impaired hearing BILATERAL HEARING AIDS    20% RIGHT HEARING DUE TO VIRUS  . Elevated PSA   . Prostate cancer ([place]) 07/20/11    Gleason 9  . Arthritis   . Skin cancer     malignant melanoma of skin   . History of radiation therapy 01/11/14- 03/04/14    prostate bed 6600 cGy 33 sessions  . Hypercholesterolemia   . Hypercholesterolemia   . BPH (benign prostatic hyperplasia)   . Tobacco use   . Tinnitus     stable since 1992  . Chronic low back pain     since 1979  . Diabetes mellitus ORAL MED    type 2  . Melanoma ([place])   . AK (actinic keratosis)   . Hearing loss     using bilateral hearing aids  . Prostate cancer [facility])    Past Surgical History  Procedure Laterality Date  . Shoulder open rotator cuff repair  07-29-2010- RIGHT    AND SUBACROMIAL DECOMPRESSION AROMIOPLASTY  . Carpometacarpal joint arthrotomy  03-22-2006  RIGHT THUMB  . Carpometacarpal joint arthrotomy  12-21-2005    LEFT THUMB  . Melanoma excision  07-12-10    RIGHT NECK  . Tonsillectomy  AGE 62  . Appendectomy  AGE 64  . Prostate biopsy  07/20/2011    Procedure: BIOPSY TRANSRECTAL ULTRASONIC PROSTATE (TUBP);  Surgeon: Molli Hazard, MD;  Location: [facility];  Service: Urology;  Laterality: N/A;  1 hour requested for this case  Saturation BX  OFFICE TO BRING ULTRASOUND MACHINE    . Robot assisted laparoscopic radical prostatectomy  10/19/2011    Procedure: ROBOTIC ASSISTED LAPAROSCOPIC RADICAL  PROSTATECTOMY;  Surgeon: Molli Hazard, MD;  Location: [facility];  Service: Urology;  Laterality: N/A;       Social History   Social History  . Marital Status: Married    Spouse Name: N/A  . Number of Children: 2   Occupational History  . Retired     Public relations account executive   Social History Main Topics  . Smoking status: Former Smoker -- 1.00 packs/day for 43 years    Types: Cigarettes    Quit date: 12/07/2007  . Smokeless tobacco: Never Used  . Alcohol Use: Yes     Comment: OCCASIONAL  . Drug Use: No   Current Outpatient Prescriptions on File Prior to Visit  Medication Sig Dispense Refill  . aspirin 325 MG tablet Take 325 mg by mouth daily with breakfast.     . atorvastatin (LIPITOR) 10 MG tablet Take 10 mg by mouth daily with breakfast.     . metFORMIN (GLUCOPHAGE) 1000 MG tablet Take 1,000 mg by mouth 2 (two) times daily with a meal.    . Multiple Vitamin (MULTIVITAMIN) tablet Take 1 tablet by mouth daily.    [name] omeprazole (PRILOSEC) 20 MG capsule Take 20 mg by mouth every morning.    . pioglitazone (ACTOS) 30 MG tablet Take 30 mg by mouth daily.    . sitaGLIPtin (JANUVIA) 100 MG tablet Take 100 mg by mouth daily.     No current facility-administered medications on file prior to visit.   Allergies  Allergen Reactions  . Cephalexin Hives   Family History  Problem Relation Age of Onset  . Cancer Mother     breast/lived 38 years post  . Alzheimer's disease Mother     deceased  . Cancer Brother     thyroid  . Heart disease Brother   . Stroke Father     56 years old  . Hypertension Father   . Alcoholism Father   . CVA Father   . Cirrhosis Paternal Grandfather     alcohol-related  . Cancer Paternal Grandmother     unknown type    PE: BP 150/80 mmHg  Pulse 70  Ht '5\' 10"'  (1.778 m)  Wt 193 lb (87.544 kg)  BMI 27.69 kg/m2  SpO2 96% Wt Readings from Last 3 Encounters:  10/29/15 193 lb (87.544 kg)  04/08/14 195 lb 14.4 oz (88.86 kg)  03/03/14 190 lb 8 oz (86.41  kg)   Constitutional: slightly overweight, in NAD Eyes: PERRLA, EOMI, no exophthalmos ENT: moist mucous membranes, no thyromegaly, no cervical lymphadenopathy Cardiovascular: RRR, No MRG Respiratory: CTA B Gastrointestinal: abdomen soft, NT, ND, BS+ Musculoskeletal: no deformities, strength intact in all 4 Skin: moist, warm, no rashes Neurological: no tremor with outstretched hands, DTR normal in all 4  ASSESSMENT: 1. DM2, non-insulin-dependent, uncontrolled, with complications - PN  PLAN:  1. Patient with long-standing, uncontrolled diabetes, on oral antidiabetic  regimen, which became insufficient. He is not checking sugars and his meter is old, so we gave him another meter (Accu-Chek guide) and advised him to start checking his sugars 1-2 times daily, writing them down and bringing them in next visit. For now, I'm reticent to stop any of his medicines (he is worried about Januvia cost, which pushes him in the doughnut hole earlier), and we also discussed about possible side effects from Actos. However, for now, I would like to add that glipizide extended-release and continue with his current medicines until I get the idea about his sugars. At next visit, we may start to decrease Actos or even stop Januvia, depending on the sugars.  - I suggested to:  Patient Instructions  Please continue: - Metformin 1000 mg 2x a day, with meals - Januvia 100 mg daily in a.m. - Actos 30 mg daily in a.m.  Please add: - Glipizide ER 5 mg daily in am, before b'fast  Please let me know if the sugars are consistently <80 or >200.  Please return in 1.5 months with your sugar log.   - Strongly advised him to start checking sugars at different times of the day - check 2 times a day, rotating checks - given sugar log and advised how to fill it and to bring it at next appt  - given foot care handout and explained the principles  - given instructions for hypoglycemia management "15-15 rule"  - advised for  yearly eye exams >> he is UTD - Return to clinic in 1.5 mo with sugar log   Frank Kingdom, MD PhD [facility]  CC:  - Dr. Tresa Moore - Dr. Valere Dross

## 2015-10-30 ENCOUNTER — Telehealth: Payer: Self-pay | Admitting: Internal Medicine

## 2015-10-30 ENCOUNTER — Encounter: Payer: Self-pay | Admitting: Internal Medicine

## 2015-10-30 MED ORDER — GLUCOSE BLOOD VI STRP: ORAL_STRIP | Status: DC

## 2015-10-30 MED ORDER — ONETOUCH ULTRA 2 W/DEVICE KIT: PACK | Status: AC

## 2015-10-30 MED ORDER — ONETOUCH DELICA LANCETS 33G MISC: Status: DC

## 2015-10-30 NOTE — Telephone Encounter (Signed)
Rx submitted for onetouch ultra.

## 2015-10-30 NOTE — Telephone Encounter (Signed)
The meter we gave him yesterday is not covered by his [employer] will cover one touch or freestyle please call [facility] on [address] for the meter and the supplies of your choice between the one touch or the freestyle  Please call the pt when this is done and called in he is going out of town on Wednesday  Good results so far from the new med put on

## 2015-10-30 NOTE — Telephone Encounter (Signed)
[place] does have one touch set aside for the pt he would like [place] to  Call in for this

## 2015-12-29 ENCOUNTER — Telehealth: Payer: Self-pay | Admitting: Internal Medicine

## 2015-12-29 ENCOUNTER — Encounter: Payer: Self-pay | Admitting: Internal Medicine

## 2015-12-29 ENCOUNTER — Other Ambulatory Visit: Payer: Self-pay

## 2015-12-29 ENCOUNTER — Ambulatory Visit (INDEPENDENT_AMBULATORY_CARE_PROVIDER_SITE_OTHER): Payer: PPO | Admitting: Internal Medicine

## 2015-12-29 VITALS — BP 124/72 | HR 95 | Ht 70.5 in | Wt 195.0 lb

## 2015-12-29 DIAGNOSIS — E1142 Type 2 diabetes mellitus with diabetic polyneuropathy: Secondary | ICD-10-CM | POA: Diagnosis not present

## 2015-12-29 DIAGNOSIS — E1165 Type 2 diabetes mellitus with hyperglycemia: Secondary | ICD-10-CM | POA: Diagnosis not present

## 2015-12-29 LAB — POCT GLYCOSYLATED HEMOGLOBIN (HGB A1C): Hemoglobin A1C: 6.8

## 2015-12-29 MED ORDER — GLIPIZIDE ER 5 MG PO TB24: 5.0000 mg | ORAL_TABLET | Freq: Every day | ORAL | 0 refills | Status: DC

## 2015-12-29 NOTE — Telephone Encounter (Signed)
Patient need a PA for medication,glipiZIDE (GLUCOTROL XL) 5 MG 24 hr tablet.ONETOUCH DELICA LANCETS 99991111 MISC  glucose blood (ONE TOUCH ULTRA TEST) test strip  Send to  [facility] [place], [place] [phone] (Phone) [phone] (Fax)

## 2015-12-29 NOTE — Progress Notes (Signed)
Patient ID: Frank Larsen, male   DOB: [birth year], 70 y.o.   MRN: [id number]  HPI: Frank Larsen is a 70 y.o.-year-old male, returning for f/u for DM2, dx in 2013, non-insulin-dependent, uncontrolled, with complications (PN). He saw Dr. Buddy Larsen in the past. Last visit with me 2 mo ago.  Last hemoglobin A1c was: Lab Results  Component Value Date   HGBA1C 8.2 08/24/2015  02/17/2015: 7.7% 10/2014: 7.6%  Pt is on a regimen of: - Metformin 1000 mg 2x a day, with meals - Januvia 100 mg daily in a.m. - Actos 30 mg daily in a.m. - added in 2016 - Glipizide ER 5 mg daily in am, before b'fast - added 10/2015  Pt started to check sugars at home: 2x a day. At last visit, he was checking 1x q 6 weeks: 160-190. - am: n/c >> (after coffee) 142-172, 192 - 2h after b'fast: n/c >> 119-155 - before lunch: n/c >> 127-152  - 2h after lunch: n/c >> 127-167 - before dinner: n/c >> 96-142 - 2h after dinner: n/c >> 98, 121-181 - bedtime: n/c - nighttime: n/c No lows. Lowest sugar was 119 >> 96; he has hypoglycemia awareness at 70.  Highest sugar was 204 >> 224  Glucometer: AccuChek - old >> given an AccuChek Guide (not covered) >> One Touch Ultra  Pt's meals are: - Breakfast: eggs, toast + PB, pancakes + waffles - Lunch: sandwich - Dinner: chicken + veggies + starch or salad - Snacks: popcorn, pretzels, cookies, chocolate  - no CKD, last BUN/creatinine:  08/24/2015: 14/1.01, EGFR 73 Lab Results  Component Value Date   BUN 10 10/20/2011   BUN 14 10/19/2011   CREATININE 0.93 10/20/2011   CREATININE 0.92 10/19/2011  He had increased microalbumin in the past and started losartan on 02/17/2015. - last set of lipids: 08/24/2015: 141/134/41/74  He is on atorvastatin. - last eye exam was 06/02/2015. No DR.  - no numbness, but has tingling in his feet. + foot exam in 08/2015 >> normal sensation to manofilament  He also has a h/o PrCA (had surgery and radiation) and Melanoma. He also has HTN,  HL.  ROS: Constitutional: no weight gain/loss, no  fatigue, no subjective hyperthermia/hypothermia, + nocturia Eyes: no blurry vision, no xerophthalmia ENT: no sore throat, no nodules palpated in throat, no dysphagia/odynophagia, no hoarseness, + tinnitus, + hypoacusis Cardiovascular: no CP/SOB/palpitations/leg swelling Respiratory: no cough/SOB Gastrointestinal: no N/V/D/C Musculoskeletal: no muscle/joint aches Skin: no rashes Neurological: no tremors/numbness/tingling/dizziness  I reviewed pt's medications, allergies, PMH, social hx, family hx, and changes were documented in the history of present illness. Otherwise, unchanged from my initial visit note.  Past Medical History:  Diagnosis Date  . AK (actinic keratosis)   . Arthritis   . BPH (benign prostatic hyperplasia)   . Chronic low back pain    since 1979  . Diabetes mellitus ORAL MED   type 2  . Elevated PSA   . GERD (gastroesophageal reflux disease)   . Hearing loss    using bilateral hearing aids  . History of radiation therapy 01/11/14- 03/04/14   prostate bed 6600 cGy 33 sessions  . Hypercholesterolemia   . Hypercholesterolemia   . Hyperlipidemia   . Impaired hearing BILATERAL HEARING AIDS   20% RIGHT HEARING DUE TO VIRUS  . Melanoma ([place])   . Normal cardiac stress test 2010  . Prostate cancer ([place]) 07/20/11   Gleason 9  . Prostate cancer ([place])   . Prostate nodule   .  Skin cancer    malignant melanoma of skin   . Tinnitus    stable since 1992  . Tobacco use    Past Surgical History:  Procedure Laterality Date  . APPENDECTOMY  AGE 74  . CARPOMETACARPAL JOINT ARTHROTOMY  03-22-2006   RIGHT THUMB  . CARPOMETACARPAL JOINT ARTHROTOMY  12-21-2005   LEFT THUMB  . MELANOMA EXCISION  07-12-10   RIGHT NECK  . PROSTATE BIOPSY  07/20/2011   Procedure: BIOPSY TRANSRECTAL ULTRASONIC PROSTATE (TUBP);  Surgeon: Molli Hazard, MD;  Location: [facility];  Service: Urology;  Laterality: N/A;  1  hour requested for this case  Saturation BX  OFFICE TO BRING ULTRASOUND MACHINE    . ROBOT ASSISTED LAPAROSCOPIC RADICAL PROSTATECTOMY  10/19/2011   Procedure: ROBOTIC ASSISTED LAPAROSCOPIC RADICAL PROSTATECTOMY;  Surgeon: Molli Hazard, MD;  Location: [facility];  Service: Urology;  Laterality: N/A;      . SHOULDER OPEN ROTATOR CUFF REPAIR  07-29-2010- RIGHT   AND SUBACROMIAL DECOMPRESSION AROMIOPLASTY  . TONSILLECTOMY  AGE 47   Social History   Social History  . Marital Status: Married    Spouse Name: N/A  . Number of Children: 2   Occupational History  . Retired     Public relations account executive   Social History Main Topics  . Smoking status: Former Smoker -- 1.00 packs/day for 43 years    Types: Cigarettes    Quit date: 12/07/2007  . Smokeless tobacco: Never Used  . Alcohol Use: Yes     Comment: OCCASIONAL  . Drug Use: No   Current Outpatient Prescriptions on File Prior to Visit  Medication Sig Dispense Refill  . aspirin 325 MG tablet Take 325 mg by mouth daily with breakfast.     . atorvastatin (LIPITOR) 10 MG tablet Take 10 mg by mouth daily with breakfast.     . Blood Glucose Monitoring Suppl (ONE TOUCH ULTRA 2) w/Device KIT Use to check blood sugar 2 times per day. 1 each 2  . glipiZIDE (GLUCOTROL XL) 5 MG 24 hr tablet Take 1 tablet (5 mg total) by mouth daily before breakfast. 60 tablet 1  . glucose blood (ONE TOUCH ULTRA TEST) test strip Use to check blood sugar 2 times per day. 100 each 12  . losartan (COZAAR) 25 MG tablet Take 25 mg by mouth daily.    . metFORMIN (GLUCOPHAGE) 1000 MG tablet Take 1,000 mg by mouth 2 (two) times daily with a meal.    . Multiple Vitamin (MULTIVITAMIN) tablet Take 1 tablet by mouth daily.    [name] omeprazole (PRILOSEC) 20 MG capsule Take 20 mg by mouth every morning.    [name] DELICA LANCETS 60Y MISC Use to check blood sugar 2 times per day. 100 each 2  . pioglitazone (ACTOS) 30 MG tablet Take 30 mg by mouth daily.    . sitaGLIPtin  (JANUVIA) 100 MG tablet Take 100 mg by mouth daily.     No current facility-administered medications on file prior to visit.    Allergies  Allergen Reactions  . Cephalexin Hives   Family History  Problem Relation Age of Onset  . Cancer Mother     breast/lived 4 years post  . Alzheimer's disease Mother     deceased  . Cancer Brother     thyroid  . Heart disease Brother   . Stroke Father     76 years old  . Hypertension Father   . Alcoholism Father   . CVA Father   .  Cirrhosis Paternal Grandfather     alcohol-related  . Cancer Paternal Grandmother     unknown type    PE: BP 124/72 (BP Location: Left Arm, Patient Position: Sitting)   Pulse 95   Ht 5' 10.5" (1.791 m)   Wt 195 lb (88.5 kg)   SpO2 95%   BMI 27.58 kg/m  Wt Readings from Last 3 Encounters:  12/29/15 195 lb (88.5 kg)  10/29/15 193 lb (87.5 kg)  04/08/14 195 lb 14.4 oz (88.9 kg)   Constitutional: slightly overweight, in NAD Eyes: PERRLA, EOMI, no exophthalmos ENT: moist mucous membranes, no thyromegaly, no cervical lymphadenopathy Cardiovascular: RRR, No MRG Respiratory: CTA B Gastrointestinal: abdomen soft, NT, ND, BS+ Musculoskeletal: no deformities, strength intact in all 4 Skin: moist, warm, no rashes Neurological: no tremor with outstretched hands, DTR normal in all 4  ASSESSMENT: 1. DM2, non-insulin-dependent, uncontrolled, with complications - PN  PLAN:  1. Patient with long-standing, uncontrolled diabetes, on oral antidiabetic regimen. He was not checking sugars at last visit >> started to check now 2x a day. - he is worried about Januvia cost, which pushes him in the doughnut hole earlier, and we also discussed about possible side effects from Actos at last visit. We added Glipizide XL at last visit, but continued the rest of the regimen, pending more data. At this visit, sugars are better. If gets into the donut hole >> may need to stop Januvia and double Glipizide dose. At next visit, if  HbA1c is still <7%, will start decreasing Actos. - I suggested to:  Patient Instructions  Please continue: - Metformin 1000 mg 2x a day, with meals - Januvia 100 mg daily in a.m. - Actos 30 mg daily in a.m. - Glipizide ER 5 mg daily in am, before b'fast  Please let me know if the sugars are consistently <80 or >200.  Please return in 3 months with your sugar log.   - continue checking sugars at different times of the day - check 2 times a day, rotating checks - advised for yearly eye exams >> he is UTD - checked HbA1c today: 6.8% (much better) - Return to clinic in 1.5 mo with sugar log   Philemon Kingdom, MD PhD [facility]  CC:  - Dr. Tresa Moore - Dr. Valere Dross

## 2015-12-29 NOTE — Patient Instructions (Signed)
Please continue: - Metformin 1000 mg 2x a day, with meals - Januvia 100 mg daily in a.m. - Actos 30 mg daily in a.m. - Glipizide ER 5 mg daily in am, before b'fast  Please let me know if the sugars are consistently <80 or >200.  Please return in 3 months with your sugar log.

## 2015-12-29 NOTE — Telephone Encounter (Signed)
He should not need any PA for glipizide... We may try to send a glipizide ER, rather than XL, but I never had to write any PA for this type of medicine.

## 2015-12-29 NOTE — Addendum Note (Signed)
Addended by: Caprice Beaver T on: 12/29/2015 10:57 AM   Modules accepted: Orders

## 2016-01-02 ENCOUNTER — Encounter: Payer: Self-pay | Admitting: Internal Medicine

## 2016-01-25 ENCOUNTER — Telehealth: Payer: Self-pay | Admitting: Internal Medicine

## 2016-01-25 ENCOUNTER — Other Ambulatory Visit: Payer: Self-pay

## 2016-01-25 MED ORDER — GLUCOSE BLOOD VI STRP: ORAL_STRIP | 5 refills | Status: AC

## 2016-01-25 MED ORDER — PIOGLITAZONE HCL 30 MG PO TABS: 30.0000 mg | ORAL_TABLET | Freq: Every day | ORAL | 0 refills | Status: DC

## 2016-01-25 MED ORDER — METFORMIN HCL 1000 MG PO TABS: 1000.0000 mg | ORAL_TABLET | Freq: Two times a day (BID) | ORAL | 0 refills | Status: DC

## 2016-01-25 MED ORDER — ONETOUCH DELICA LANCETS 33G MISC: 2 refills | Status: DC

## 2016-01-25 NOTE — Telephone Encounter (Signed)
Pt needs [place] to call in metformin 100 mg 90 day supply, tioglitazone HCL 30 mg 90 day; and januvia 100 mg 90 day supply called into [facility] please  Pt also needs test strips and lancets for his one touch ultra 2 meter also needs to be called into [facility] soon please  Pt has hit the donut hole because of [place] he currently has 33 days left of this the [place] is going to cost him over $400 dollars, before calling in [place] please advise on what to do to assist this pt with this rx

## 2016-01-26 ENCOUNTER — Telehealth: Payer: Self-pay

## 2016-01-26 NOTE — Telephone Encounter (Signed)
Ok to stop Januvia after he finishes his tablets. If sugars start increasing during the day, he may need to increase glipizide extended-release from 5 mg to 10 mg in a.m.

## 2016-01-26 NOTE — Telephone Encounter (Signed)
Called patient and notified of medications that were sent in. Also advised patient that Dr.Gherghe said after his jardiance was finished he can stop taking it, and let us know how his sugars are in case we need to increase his glipizide. Patient agreed to do so, and will let us know how he is doing. He has appointment in November.

## 2016-01-27 ENCOUNTER — Telehealth: Payer: Self-pay | Admitting: Internal Medicine

## 2016-01-27 ENCOUNTER — Other Ambulatory Visit: Payer: Self-pay

## 2016-01-27 MED ORDER — ONETOUCH DELICA LANCETS 33G MISC: 2 refills | Status: AC

## 2016-01-27 NOTE — Telephone Encounter (Signed)
[facility] needs a 200 quantity on the lancet rx please for pt to get 90 day supply

## 2016-02-10 DIAGNOSIS — C61 Malignant neoplasm of prostate: Secondary | ICD-10-CM | POA: Diagnosis not present

## 2016-02-15 DIAGNOSIS — D492 Neoplasm of unspecified behavior of bone, soft tissue, and skin: Secondary | ICD-10-CM | POA: Diagnosis not present

## 2016-02-15 DIAGNOSIS — C61 Malignant neoplasm of prostate: Secondary | ICD-10-CM | POA: Diagnosis not present

## 2016-02-15 DIAGNOSIS — N393 Stress incontinence (female) (male): Secondary | ICD-10-CM | POA: Diagnosis not present

## 2016-02-21 ENCOUNTER — Other Ambulatory Visit: Payer: Self-pay | Admitting: Internal Medicine

## 2016-03-01 ENCOUNTER — Other Ambulatory Visit: Payer: Self-pay | Admitting: Family Medicine

## 2016-03-01 ENCOUNTER — Ambulatory Visit
Admission: RE | Admit: 2016-03-01 | Discharge: 2016-03-01 | Disposition: A | Discharge status: Home / Self Care | Payer: PPO | Source: Ambulatory Visit | Attending: Family Medicine | Admitting: Family Medicine

## 2016-03-01 DIAGNOSIS — R109 Unspecified abdominal pain: Secondary | ICD-10-CM

## 2016-03-01 DIAGNOSIS — Z8546 Personal history of malignant neoplasm of prostate: Secondary | ICD-10-CM | POA: Diagnosis not present

## 2016-03-01 DIAGNOSIS — R140 Abdominal distension (gaseous): Secondary | ICD-10-CM | POA: Diagnosis not present

## 2016-03-01 MED ORDER — IOPAMIDOL (ISOVUE-300) INJECTION 61%
100.0000 mL | Freq: Once | INTRAVENOUS | Status: AC | PRN
  Administered 2016-03-01: 100 mL via INTRAVENOUS

## 2016-03-02 DIAGNOSIS — C9000 Multiple myeloma not having achieved remission: Secondary | ICD-10-CM | POA: Diagnosis not present

## 2016-03-03 ENCOUNTER — Telehealth: Payer: Self-pay | Admitting: Oncology

## 2016-03-03 NOTE — Telephone Encounter (Signed)
Pt was offered Mon, 10/30 at 9;30 am and wanted to see if another date is available due to having an previous appt on 10/30 at 9:45 am. In basket the provider for alternate dates for the pt.

## 2016-03-05 ENCOUNTER — Encounter (HOSPITAL_COMMUNITY): Payer: Self-pay | Admitting: Emergency Medicine

## 2016-03-05 ENCOUNTER — Emergency Department (HOSPITAL_COMMUNITY)
Admission: EM | Admit: 2016-03-05 | Discharge: 2016-03-05 | Disposition: A | Discharge status: Home / Self Care | Payer: PPO | Attending: Emergency Medicine | Admitting: Emergency Medicine

## 2016-03-05 DIAGNOSIS — Z7984 Long term (current) use of oral hypoglycemic drugs: Secondary | ICD-10-CM | POA: Insufficient documentation

## 2016-03-05 DIAGNOSIS — Z87891 Personal history of nicotine dependence: Secondary | ICD-10-CM | POA: Diagnosis not present

## 2016-03-05 DIAGNOSIS — R1031 Right lower quadrant pain: Secondary | ICD-10-CM | POA: Insufficient documentation

## 2016-03-05 DIAGNOSIS — R1011 Right upper quadrant pain: Secondary | ICD-10-CM | POA: Diagnosis not present

## 2016-03-05 DIAGNOSIS — E119 Type 2 diabetes mellitus without complications: Secondary | ICD-10-CM | POA: Diagnosis not present

## 2016-03-05 DIAGNOSIS — Z79899 Other long term (current) drug therapy: Secondary | ICD-10-CM | POA: Diagnosis not present

## 2016-03-05 DIAGNOSIS — R112 Nausea with vomiting, unspecified: Secondary | ICD-10-CM

## 2016-03-05 DIAGNOSIS — R197 Diarrhea, unspecified: Secondary | ICD-10-CM | POA: Diagnosis not present

## 2016-03-05 DIAGNOSIS — R1030 Lower abdominal pain, unspecified: Secondary | ICD-10-CM

## 2016-03-05 DIAGNOSIS — Z7982 Long term (current) use of aspirin: Secondary | ICD-10-CM | POA: Diagnosis not present

## 2016-03-05 DIAGNOSIS — Z8546 Personal history of malignant neoplasm of prostate: Secondary | ICD-10-CM | POA: Diagnosis not present

## 2016-03-05 DIAGNOSIS — Z85828 Personal history of other malignant neoplasm of skin: Secondary | ICD-10-CM | POA: Diagnosis not present

## 2016-03-05 LAB — URINE MICROSCOPIC-ADD ON

## 2016-03-05 LAB — COMPREHENSIVE METABOLIC PANEL
ALT: 17 U/L (ref 17–63)
ANION GAP: 12 (ref 5–15)
AST: 21 U/L (ref 15–41)
Albumin: 4.1 g/dL (ref 3.5–5.0)
Alkaline Phosphatase: 42 U/L (ref 38–126)
BUN: 28 mg/dL — ABNORMAL HIGH (ref 6–20)
CHLORIDE: 102 mmol/L (ref 101–111)
CO2: 22 mmol/L (ref 22–32)
CREATININE: 0.95 mg/dL (ref 0.61–1.24)
Calcium: 9.2 mg/dL (ref 8.9–10.3)
GFR calc non Af Amer: >60 mL/min (ref >60)
Glucose, Bld: 205 mg/dL — ABNORMAL HIGH (ref 65–99)
POTASSIUM: 4.1 mmol/L (ref 3.5–5.1)
SODIUM: 136 mmol/L (ref 135–145)
Total Bilirubin: 0.8 mg/dL (ref 0.3–1.2)
Total Protein: 7.6 g/dL (ref 6.5–8.1)

## 2016-03-05 LAB — CBC
HEMATOCRIT: 38.4 % — AB (ref 39.0–52.0)
HEMOGLOBIN: 12 g/dL — AB (ref 13.0–17.0)
MCH: 26.7 pg (ref 26.0–34.0)
MCHC: 31.3 g/dL (ref 30.0–36.0)
MCV: 85.3 fL (ref 78.0–100.0)
Platelets: 352 10*3/uL (ref 150–400)
RBC: 4.5 MIL/uL (ref 4.22–5.81)
RDW: 14 % (ref 11.5–15.5)
WBC: 6.1 10*3/uL (ref 4.0–10.5)

## 2016-03-05 LAB — URINALYSIS, ROUTINE W REFLEX MICROSCOPIC
GLUCOSE, UA: NEGATIVE mg/dL
HGB URINE DIPSTICK: NEGATIVE
Ketones, ur: 15 mg/dL — AB
Nitrite: NEGATIVE
Protein, ur: 30 mg/dL — AB
SPECIFIC GRAVITY, URINE: 1.036 — AB (ref 1.005–1.030)
pH: 6 (ref 5.0–8.0)

## 2016-03-05 LAB — LIPASE, BLOOD: LIPASE: 17 U/L (ref 11–51)

## 2016-03-05 MED ORDER — HYDROCODONE-ACETAMINOPHEN 5-325 MG PO TABS
1.0000 | ORAL_TABLET | Freq: Once | ORAL | Status: AC
  Administered 2016-03-05: 1 via ORAL
  Filled 2016-03-05: qty 1

## 2016-03-05 MED ORDER — HYDROCODONE-ACETAMINOPHEN 5-325 MG PO TABS: 1.0000 | ORAL_TABLET | Freq: Four times a day (QID) | ORAL | 0 refills | Status: DC | PRN

## 2016-03-05 MED ORDER — ONDANSETRON 4 MG PO TBDP: 4.0000 mg | ORAL_TABLET | Freq: Three times a day (TID) | ORAL | 0 refills | Status: DC | PRN

## 2016-03-05 MED ORDER — ONDANSETRON 4 MG PO TBDP
4.0000 mg | ORAL_TABLET | Freq: Once | ORAL | Status: AC
  Administered 2016-03-05: 4 mg via ORAL
  Filled 2016-03-05: qty 1

## 2016-03-05 MED ORDER — SODIUM CHLORIDE 0.9 % IV BOLUS (SEPSIS)
500.0000 mL | Freq: Once | INTRAVENOUS | Status: AC
  Administered 2016-03-05: 500 mL via INTRAVENOUS

## 2016-03-05 NOTE — ED Triage Notes (Signed)
Patient c/o upper abd intermittent cramping that started on Tuesday morning.  Patient went to PCP on Tuesday and then had imaging on abd.  Patient has had n/v/d over the past couple days with little appetite due to cramping after eating.  Patient had prostate cancer before and got Ct results sent to urologist but was told that he doesn't believe the abd pain is related to what he follows patient for.  Patient believes that this could be cancerous and not able to get into appt at cancer center until Nov 6.

## 2016-03-05 NOTE — ED Provider Notes (Signed)
[facility] DEPT Provider Note   CSN: [id number] Arrival date & time: 03/05/16  1400     History   Chief Complaint Chief Complaint  Patient presents with  . Abdominal Cramping  . Emesis    HPI Frank Larsen is a 70 y.o. male.  Patient is 70 yo M with PMH of diabetes, history of prostate cancer and melanoma, only abdominal surgical history of appendectomy, presenting with chief complaint of abdominal pain and intermittent vomiting starting Tuesday morning. Patient saw his PCP later on Tuesday, sent for abdominal CT on Wednesday, which showed concerns for possible metastatic disease. Patient given referral to oncologist and scheduled an appointment on 11/6, but pain has been constant, and he decided to come to ED for further evaluation. Pain is located diffusely across RLQ and LLQ, and described as "cramping." Patient has had decreased PO intake throughout the week, but noted pain is worse after eating. Denies any fever, chills, hematemesis, blood in stools, but noted several episodes of diarrhea starting on Thursday. Currently rates pain 2/10, with no abdominal cramping. Patient is former smoker, and occasionally drinks alcohol.      Past Medical History:  Diagnosis Date  . AK (actinic keratosis)   . Arthritis   . BPH (benign prostatic hyperplasia)   . Chronic low back pain    since 1979  . Diabetes mellitus ORAL MED   type 2  . Elevated PSA   . GERD (gastroesophageal reflux disease)   . Hearing loss    using bilateral hearing aids  . History of radiation therapy 01/11/14- 03/04/14   prostate bed 6600 cGy 33 sessions  . Hypercholesterolemia   . Hypercholesterolemia   . Hyperlipidemia   . Impaired hearing BILATERAL HEARING AIDS   20% RIGHT HEARING DUE TO VIRUS  . Melanoma ([place])   . Normal cardiac stress test 2010  . Prostate cancer ([place]) 07/20/11   Gleason 9  . Prostate cancer ([place])   . Prostate nodule   . Skin cancer    malignant melanoma of skin   . Tinnitus      stable since 1992  . Tobacco use     Patient Active Problem List   Diagnosis Date Noted  . Poorly controlled type 2 diabetes mellitus with peripheral neuropathy ([place]) 10/29/2015  . Hypercholesterolemia   . BPH (benign prostatic hyperplasia)   . Prostate CA ([place]) 08/17/2011    Past Surgical History:  Procedure Laterality Date  . APPENDECTOMY  AGE 25  . CARPOMETACARPAL JOINT ARTHROTOMY  03-22-2006   RIGHT THUMB  . CARPOMETACARPAL JOINT ARTHROTOMY  12-21-2005   LEFT THUMB  . MELANOMA EXCISION  07-12-10   RIGHT NECK  . PROSTATE BIOPSY  07/20/2011   Procedure: BIOPSY TRANSRECTAL ULTRASONIC PROSTATE (TUBP);  Surgeon: Molli Hazard, MD;  Location: [facility];  Service: Urology;  Laterality: N/A;  1 hour requested for this case  Saturation BX  OFFICE TO BRING ULTRASOUND MACHINE    . ROBOT ASSISTED LAPAROSCOPIC RADICAL PROSTATECTOMY  10/19/2011   Procedure: ROBOTIC ASSISTED LAPAROSCOPIC RADICAL PROSTATECTOMY;  Surgeon: Molli Hazard, MD;  Location: [facility];  Service: Urology;  Laterality: N/A;      . SHOULDER OPEN ROTATOR CUFF REPAIR  07-29-2010- RIGHT   AND SUBACROMIAL DECOMPRESSION AROMIOPLASTY  . TONSILLECTOMY  AGE 36       Home Medications    Prior to Admission medications   Medication Sig Start Date End Date Taking? Authorizing Provider  aspirin 325 MG tablet Take 325 mg  by mouth daily with breakfast.     Historical Provider, MD  atorvastatin (LIPITOR) 10 MG tablet Take 10 mg by mouth daily with breakfast.     Historical Provider, MD  Blood Glucose Monitoring Suppl (ONE TOUCH ULTRA 2) w/Device KIT Use to check blood sugar 2 times per day. 10/30/15   Elayne Snare, MD  glipiZIDE (GLUCOTROL XL) 5 MG 24 hr tablet Take 1 tablet (5 mg total) by mouth daily with breakfast. 12/29/15   Philemon Kingdom, MD  glipiZIDE (GLUCOTROL XL) 5 MG 24 hr tablet TAKE 1 TABLET (5 MG TOTAL) BY MOUTH DAILY BEFORE BREAKFAST. 02/22/16   Philemon Kingdom, MD  glucose  blood (ONE TOUCH ULTRA TEST) test strip Use to check blood sugar 2 times per day. 01/25/16   Philemon Kingdom, MD  losartan (COZAAR) 25 MG tablet Take 25 mg by mouth daily.    Historical Provider, MD  metFORMIN (GLUCOPHAGE) 1000 MG tablet Take 1 tablet (1,000 mg total) by mouth 2 (two) times daily with a meal. 01/25/16   Philemon Kingdom, MD  Multiple Vitamin (MULTIVITAMIN) tablet Take 1 tablet by mouth daily.    Historical Provider, MD  omeprazole (PRILOSEC) 20 MG capsule Take 20 mg by mouth every morning.    Historical Provider, MD  [facility] DELICA LANCETS 76P MISC Use to check blood sugar 2 times per day. 01/27/16   Philemon Kingdom, MD  pioglitazone (ACTOS) 30 MG tablet Take 1 tablet (30 mg total) by mouth daily. 01/25/16   Philemon Kingdom, MD  pseudoephedrine-acetaminophen (TYLENOL SINUS) 30-500 MG TABS tablet Take 1 tablet by mouth every 4 (four) hours as needed.    Historical Provider, MD  sitaGLIPtin (JANUVIA) 100 MG tablet Take 100 mg by mouth daily.    Historical Provider, MD    Family History Family History  Problem Relation Age of Onset  . Cancer Mother     breast/lived 43 years post  . Alzheimer's disease Mother     deceased  . Cancer Brother     thyroid  . Heart disease Brother   . Stroke Father     27 years old  . Hypertension Father   . Alcoholism Father   . CVA Father   . Cirrhosis Paternal Grandfather     alcohol-related  . Cancer Paternal Grandmother     unknown type     Social History Social History  Substance Use Topics  . Smoking status: Former Smoker    Packs/day: 1.00    Years: 43.00    Types: Cigarettes    Quit date: 12/07/2007  . Smokeless tobacco: Never Used  . Alcohol use Yes     Comment: OCCASIONAL     Allergies   Cephalexin   Review of Systems Review of Systems  Constitutional: Negative for chills and fever.  HENT: Negative for ear pain and sore throat.   Eyes: Negative for pain and visual disturbance.  Respiratory: Negative for cough  and shortness of breath.   Cardiovascular: Negative for chest pain, palpitations and leg swelling.  Gastrointestinal: Positive for abdominal distention, abdominal pain, diarrhea and vomiting. Negative for blood in stool.  Genitourinary: Negative for dysuria, flank pain and hematuria.  Musculoskeletal: Negative for back pain and neck pain.  Skin: Negative for color change and rash.  Neurological: Negative for dizziness, seizures, syncope, weakness, numbness and headaches.     Physical Exam Updated Vital Signs BP 125/76 (BP Location: Left Arm)   Pulse 107   Temp 98 F (36.7 C) (Oral)   Resp  20   SpO2 96%   Physical Exam  Constitutional: He appears well-developed and well-nourished. No distress.  HENT:  Head: Normocephalic and atraumatic.  Mouth/Throat: Oropharynx is clear and moist.  Eyes: Conjunctivae are normal.  Neck: Normal range of motion.  Cardiovascular: Normal rate, regular rhythm, normal heart sounds and intact distal pulses.   Pulmonary/Chest: Effort normal and breath sounds normal. No respiratory distress.  Abdominal: Soft. Bowel sounds are normal. He exhibits distension. He exhibits no mass. There is tenderness (mildy TTP in RUQ and RLQ). There is no guarding. No hernia.  Musculoskeletal: Normal range of motion. He exhibits no edema or tenderness.  Neurological: He is alert.  Skin: Skin is warm and dry.  Psychiatric: He has a normal mood and affect.  Nursing note and vitals reviewed.    ED Treatments / Results  Labs (all labs ordered are listed, but only abnormal results are displayed) Labs Reviewed  COMPREHENSIVE METABOLIC PANEL - Abnormal; Notable for the following:       Result Value   Glucose, Bld 205 (*)    BUN 28 (*)    All other components within normal limits  CBC - Abnormal; Notable for the following:    Hemoglobin 12.0 (*)    HCT 38.4 (*)    All other components within normal limits  LIPASE, BLOOD  URINALYSIS, ROUTINE W REFLEX MICROSCOPIC (NOT AT  [facility])    EKG  EKG Interpretation None       Radiology No results found.  Procedures Procedures (including critical care time)  Medications Ordered in ED Medications - No data to display   Initial Impression / Assessment and Plan / ED Course  I have reviewed the triage vital signs and the nursing notes.  Pertinent labs & imaging results that were available during my care of the patient were reviewed by me and considered in my medical decision making (see chart for details).  Clinical Course   Patient is 70 yo M presenting with chief complaint of abdominal pain and intermittent vomiting starting Tuesday morning. Patient sent for abdominal CT on 10/25, which showed concerns for possible metastatic disease. CBC and CMP unremarkable from baseline, and lipase of 17 WNL. Urinalysis shows evidence of dehydration, and given IVF, Zofran, and Norco for relief of abdominal pain and nausea. Reviewed findings of CT on 10/25 with attending physician, Dr. Quintella Reichert, who consulted oncology. Oncology determined patient does not warrant admission, and stable to f/u with oncology at scheduled appointment on 11/6. Patient states abdominal pain completely resolved. Provided prescription for Norco and Zofran. Patient appreciative of care and agreeable to d/c plan. Return precautions discussed for worsening abdominal pain or intractable vomiting and diarrhea.  Final Clinical Impressions(s) / ED Diagnoses   Final diagnoses:  Lower abdominal pain  Nausea vomiting and diarrhea    New Prescriptions New Prescriptions   HYDROCODONE-ACETAMINOPHEN (NORCO/VICODIN) 5-325 MG TABLET    Take 1-2 tablets by mouth every 6 (six) hours as needed for moderate pain.   ONDANSETRON (ZOFRAN ODT) 4 MG DISINTEGRATING TABLET    Take 1 tablet (4 mg total) by mouth every 8 (eight) hours as needed for nausea or vomiting.     [place], [place] 03/05/16 2009    Quintella Reichert, MD 03/08/16 1349

## 2016-03-05 NOTE — Discharge Instructions (Signed)
Please take the Norco for abdominal pain, and Zofran as needed for relief of nausea and vomiting. Also, please keep your appointment with your oncologist on November 6th for further evaluation of the findings of your CT scan this week.

## 2016-03-07 DIAGNOSIS — C61 Malignant neoplasm of prostate: Secondary | ICD-10-CM | POA: Diagnosis not present

## 2016-03-07 DIAGNOSIS — K219 Gastro-esophageal reflux disease without esophagitis: Secondary | ICD-10-CM | POA: Diagnosis not present

## 2016-03-07 DIAGNOSIS — E1165 Type 2 diabetes mellitus with hyperglycemia: Secondary | ICD-10-CM | POA: Diagnosis not present

## 2016-03-07 DIAGNOSIS — Z1389 Encounter for screening for other disorder: Secondary | ICD-10-CM | POA: Diagnosis not present

## 2016-03-07 DIAGNOSIS — Z0000 Encounter for general adult medical examination without abnormal findings: Secondary | ICD-10-CM | POA: Diagnosis not present

## 2016-03-07 DIAGNOSIS — I10 Essential (primary) hypertension: Secondary | ICD-10-CM | POA: Diagnosis not present

## 2016-03-07 DIAGNOSIS — Z23 Encounter for immunization: Secondary | ICD-10-CM | POA: Diagnosis not present

## 2016-03-07 DIAGNOSIS — C786 Secondary malignant neoplasm of retroperitoneum and peritoneum: Secondary | ICD-10-CM | POA: Diagnosis not present

## 2016-03-07 DIAGNOSIS — F439 Reaction to severe stress, unspecified: Secondary | ICD-10-CM | POA: Diagnosis not present

## 2016-03-07 DIAGNOSIS — M899 Disorder of bone, unspecified: Secondary | ICD-10-CM | POA: Diagnosis not present

## 2016-03-07 DIAGNOSIS — R1084 Generalized abdominal pain: Secondary | ICD-10-CM | POA: Diagnosis not present

## 2016-03-07 DIAGNOSIS — E782 Mixed hyperlipidemia: Secondary | ICD-10-CM | POA: Diagnosis not present

## 2016-03-08 ENCOUNTER — Telehealth: Payer: Self-pay | Admitting: Oncology

## 2016-03-08 NOTE — Telephone Encounter (Signed)
Contacted pt and referring provider that appt was moved to 11/2 at 2 pm with dr. Alen Blew

## 2016-03-09 ENCOUNTER — Telehealth: Payer: Self-pay | Admitting: *Deleted

## 2016-03-09 NOTE — Telephone Encounter (Signed)
This RN spoke with patient reminding him of his appointment with Dr. Alen Blew tomorrow. Patient verbalized understanding.

## 2016-03-10 ENCOUNTER — Telehealth: Payer: Self-pay | Admitting: Oncology

## 2016-03-10 ENCOUNTER — Ambulatory Visit (HOSPITAL_BASED_OUTPATIENT_CLINIC_OR_DEPARTMENT_OTHER): Payer: PPO | Admitting: Oncology

## 2016-03-10 VITALS — BP 112/77 | HR 80 | Temp 97.7°F | Resp 18 | Ht 70.5 in | Wt 185.1 lb

## 2016-03-10 DIAGNOSIS — R1111 Vomiting without nausea: Secondary | ICD-10-CM | POA: Diagnosis not present

## 2016-03-10 DIAGNOSIS — Z8546 Personal history of malignant neoplasm of prostate: Secondary | ICD-10-CM

## 2016-03-10 DIAGNOSIS — R109 Unspecified abdominal pain: Secondary | ICD-10-CM | POA: Diagnosis not present

## 2016-03-10 DIAGNOSIS — Z85820 Personal history of malignant melanoma of skin: Secondary | ICD-10-CM

## 2016-03-10 DIAGNOSIS — C800 Disseminated malignant neoplasm, unspecified: Secondary | ICD-10-CM | POA: Diagnosis not present

## 2016-03-10 DIAGNOSIS — C786 Secondary malignant neoplasm of retroperitoneum and peritoneum: Secondary | ICD-10-CM

## 2016-03-10 DIAGNOSIS — C61 Malignant neoplasm of prostate: Secondary | ICD-10-CM

## 2016-03-10 NOTE — Progress Notes (Signed)
Reason for Referral: Omental carcinomatosis   HPI: 70 year old gentleman native of [place] but have been living in this area for the last 27 years. He has history of hypertension and diabetes and currently on oral medications only. He was diagnosed with prostate cancer in 2013. At that time he was found to have elevated PSA of 3.6. Biopsy showed a Gleason score 5+4 = 9. He subsequently underwent robotic-assisted laparoscopic radical prostatectomy and extended bilateral pelvic lymphadenectomy by Dr. Burton Apley. The final pathology showed prostate adenocarcinoma Gleason score 4+5 = 9 with no lymph node involvement and 12 lymph nodes sampled. as stage T2cN0. This was accomplished on 10/19/2011. His PSA nadir was 0.01 and was up to 0.08 in 2015. He did receive salvage radiation therapy to the prostatic bed completed in November 2015. His PSA did show mild rise to 0.99 and in the care of Dr. Tresa Moore at [facility] urology. He also has history of resected melanoma on his neck as well as face in the last 5 years. The details of those tumors are not available to me.   He has been in his normal state of health until last week where he started developing abdominal distention, nausea, vomiting and diarrhea. He also noted to have abdominal pain. He was evaluated by Dr. Moreen Fowler and a CT scan of the abdomen and pelvis showed extensive fat stranding and soft tissue caking throughout the omentum suspicious for peritoneal carcinomatosis. New mild pericardiophrenic lymphadenopathy noted with a lytic destructive lesion at the right posterior lateral eighth rib also noted. These findings are concerning from the static disease and was referred to me for evaluation.  Clinically, he reported some improvement in his abdominal pain but that symptoms got exacerbated and was seen in the emergency department on 03/05/2016. He received intravenous hydration and was feeling better upon discharge. Since that time, his by mouth intake has been very  limited to liquids as well as nutritional supplements. He has not had any solid foods. He reports that he fears he will vomit after eating. He lost close to 12 pounds. He was in reasonable health for that including golfing and attends activities of daily living. He still able to drive but slightly limited because of his recent illness. He lives with his wife who is currently out of town attending to family illness.  He does not report any headaches, blurry vision, syncope or seizures. He does not report any fevers, chills, sweats but does report weight loss and poor by mouth intake. He does not report any chest pain, palpitation, orthopnea or leg edema. He does not report any cough, wheezing or hemoptysis. He does not report any hematochezia, melena or diarrhea at this time. He has not moved his bowels last few days. He does not report any frequency, urgency or hematuria. He does not report any skeletal complaints. Remaining review of systems and.   Past Medical History:  Diagnosis Date  . AK (actinic keratosis)   . Arthritis   . BPH (benign prostatic hyperplasia)   . Chronic low back pain    since 1979  . Diabetes mellitus ORAL MED   type 2  . Elevated PSA   . GERD (gastroesophageal reflux disease)   . Hearing loss    using bilateral hearing aids  . History of radiation therapy 01/11/14- 03/04/14   prostate bed 6600 cGy 33 sessions  . Hypercholesterolemia   . Hypercholesterolemia   . Hyperlipidemia   . Impaired hearing BILATERAL HEARING AIDS   20% RIGHT HEARING DUE  TO VIRUS  . Melanoma ([place])   . Normal cardiac stress test 2010  . Prostate cancer ([place]) 07/20/11   Gleason 9  . Prostate cancer ([place])   . Prostate nodule   . Skin cancer    malignant melanoma of skin   . Tinnitus    stable since 1992  . Tobacco use   :  Past Surgical History:  Procedure Laterality Date  . APPENDECTOMY  AGE 33  . CARPOMETACARPAL JOINT ARTHROTOMY  03-22-2006   RIGHT THUMB  . CARPOMETACARPAL JOINT  ARTHROTOMY  12-21-2005   LEFT THUMB  . MELANOMA EXCISION  07-12-10   RIGHT NECK  . PROSTATE BIOPSY  07/20/2011   Procedure: BIOPSY TRANSRECTAL ULTRASONIC PROSTATE (TUBP);  Surgeon: Molli Hazard, MD;  Location: [facility];  Service: Urology;  Laterality: N/A;  1 hour requested for this case  Saturation BX  OFFICE TO BRING ULTRASOUND MACHINE    . ROBOT ASSISTED LAPAROSCOPIC RADICAL PROSTATECTOMY  10/19/2011   Procedure: ROBOTIC ASSISTED LAPAROSCOPIC RADICAL PROSTATECTOMY;  Surgeon: Molli Hazard, MD;  Location: [facility];  Service: Urology;  Laterality: N/A;      . SHOULDER OPEN ROTATOR CUFF REPAIR  07-29-2010- RIGHT   AND SUBACROMIAL DECOMPRESSION AROMIOPLASTY  . TONSILLECTOMY  AGE 19  :   Current Outpatient Prescriptions:  .  aspirin 325 MG tablet, Take 325 mg by mouth daily with breakfast. , Disp: , Rfl:  .  atorvastatin (LIPITOR) 10 MG tablet, Take 10 mg by mouth daily with breakfast. , Disp: , Rfl:  .  Blood Glucose Monitoring Suppl (ONE TOUCH ULTRA 2) w/Device KIT, Use to check blood sugar 2 times per day., Disp: 1 each, Rfl: 2 .  glipiZIDE (GLUCOTROL XL) 5 MG 24 hr tablet, Take 1 tablet (5 mg total) by mouth daily with breakfast., Disp: 90 tablet, Rfl: 0 .  glucose blood (ONE TOUCH ULTRA TEST) test strip, Use to check blood sugar 2 times per day., Disp: 200 each, Rfl: 5 .  HYDROcodone-acetaminophen (NORCO/VICODIN) 5-325 MG tablet, Take 1-2 tablets by mouth every 6 (six) hours as needed for moderate pain., Disp: 20 tablet, Rfl: 0 .  losartan (COZAAR) 25 MG tablet, Take 25 mg by mouth daily., Disp: , Rfl:  .  metFORMIN (GLUCOPHAGE) 1000 MG tablet, Take 1 tablet (1,000 mg total) by mouth 2 (two) times daily with a meal., Disp: 90 tablet, Rfl: 0 .  Multiple Vitamin (MULTIVITAMIN) tablet, Take 1 tablet by mouth daily., Disp: , Rfl:  .  omeprazole (PRILOSEC) 20 MG capsule, Take 20 mg by mouth every morning., Disp: , Rfl:  .  ONETOUCH DELICA LANCETS 62B MISC,  Use to check blood sugar 2 times per day., Disp: 200 each, Rfl: 2 .  pioglitazone (ACTOS) 30 MG tablet, Take 1 tablet (30 mg total) by mouth daily., Disp: 90 tablet, Rfl: 0 .  pseudoephedrine-acetaminophen (TYLENOL SINUS) 30-500 MG TABS tablet, Take 1 tablet by mouth every 4 (four) hours as needed (congestion). , Disp: , Rfl:  .  sitaGLIPtin (JANUVIA) 100 MG tablet, Take 100 mg by mouth daily., Disp: , Rfl: :  Allergies  Allergen Reactions  . Scallops [Shellfish Allergy] Nausea And Vomiting  . Cephalexin Hives and Rash  :  Family History  Problem Relation Age of Onset  . Cancer Mother     breast/lived 87 years post  . Alzheimer's disease Mother     deceased  . Cancer Brother     thyroid  . Heart disease Brother   .  Stroke Father     89 years old  . Hypertension Father   . Alcoholism Father   . CVA Father   . Cirrhosis Paternal Grandfather     alcohol-related  . Cancer Paternal Grandmother     unknown type   :  Social History   Social History  . Marital status: Married    Spouse name: N/A  . Number of children: N/A  . Years of education: N/A   Occupational History  . Retired     Public relations account executive   Social History Main Topics  . Smoking status: Former Smoker    Packs/day: 1.00    Years: 43.00    Types: Cigarettes    Quit date: 12/07/2007  . Smokeless tobacco: Never Used  . Alcohol use Yes     Comment: OCCASIONAL  . Drug use: No  . Sexual activity: Not on file   Other Topics Concern  . Not on file   Social History Narrative  . No narrative on file  :  Pertinent items are noted in HPI.  Exam: Blood pressure 112/77, pulse 80, temperature 97.7 F (36.5 C), temperature source Oral, resp. rate 18, height 5' 10.5" (1.791 m), weight 185 lb 1.6 oz (84 kg), SpO2 100 %.  ECOG 1  General appearance: alert and cooperative appeared without distress. Head: Normocephalic, without obvious abnormality Nose: Nares normal. Septum midline. Mucosa normal. No drainage or  sinus tenderness. Throat: lips, mucosa, and tongue normal; teeth and gums normal dry mucous membranes noted. Neck: no adenopathy Back: negative Resp: clear to auscultation bilaterally no rhonchi, wheezes. Cardio: regular rate and rhythm, S1, S2 normal, no murmur, click, rub or gallop GI: soft, slightly distended with bowel sounds. No shifting dullness or ascites. Extremities: extremities normal, atraumatic, no cyanosis or edema  CBC    Component Value Date/Time   WBC 6.1 03/05/2016 1459   RBC 4.50 03/05/2016 1459   HGB 12.0 (L) 03/05/2016 1459   HCT 38.4 (L) 03/05/2016 1459   PLT 352 03/05/2016 1459   MCV 85.3 03/05/2016 1459   MCH 26.7 03/05/2016 1459   MCHC 31.3 03/05/2016 1459   RDW 14.0 03/05/2016 1459     Chemistry      Component Value Date/Time   NA 136 03/05/2016 1459   K 4.1 03/05/2016 1459   CL 102 03/05/2016 1459   CO2 22 03/05/2016 1459   BUN 28 (H) 03/05/2016 1459   CREATININE 0.95 03/05/2016 1459   GLU 155 08/24/2015      Component Value Date/Time   CALCIUM 9.2 03/05/2016 1459   ALKPHOS 42 03/05/2016 1459   AST 21 03/05/2016 1459   ALT 17 03/05/2016 1459   BILITOT 0.8 03/05/2016 1459       Ct Abdomen Pelvis W Contrast  Result Date: 03/01/2016 CLINICAL DATA:  Mid abdominal pain, nausea and vomiting since earlier this morning. History of prostate cancer status post prostatectomy 10/19/2011. Prior appendectomy. EXAM: CT ABDOMEN AND PELVIS WITH CONTRAST TECHNIQUE: Multidetector CT imaging of the abdomen and pelvis was performed using the standard protocol following bolus administration of intravenous contrast. Creatinine was obtained on site at [place] at [address] Results: Creatinine 0.7 mg/dL. CONTRAST:  158m ISOVUE-300 IOPAMIDOL (ISOVUE-300) INJECTION 61% COMPARISON:  08/11/2011 CT abdomen/ pelvis.  09/06/2013 CT pelvis. FINDINGS: Lower chest: No significant pulmonary nodules or acute consolidative airspace disease. Coronary  atherosclerosis. Hepatobiliary: Normal liver size. Hypodense 0.7 cm lateral segment left liver lobe lesion, too small characterize, stable in size since  08/11/2011, consistent with a benign lesion. No additional liver lesions. Normal gallbladder with no radiopaque cholelithiasis. No biliary ductal dilatation. Pancreas: Stable coarse subcentimeter calcification in the pancreatic tail, nonspecific, possibly vascular or the sequela of prior pancreatitis. Otherwise normal pancreas with no pancreatic mass and no pancreatic duct dilation. Spleen: Normal size. No mass. Adrenals/Urinary Tract: Normal adrenals. No hydronephrosis. At least partial duplication of the left renal collecting system. No renal masses. Completely collapsed and grossly normal bladder. Stomach/Bowel: Small hiatal hernia. Otherwise grossly normal stomach. Mild diffuse small bowel dilatation. There are fluid levels throughout the dilated proximal and mid small bowel loops. There is small bowel stool sign throughout the distal and terminal ileum. No focal small bowel caliber transition. There is scattered irregular mild small bowel wall thickening noted in the mid (series 3/ image 55) and distal small bowel (series 3/ image 72). Appendectomy. Large bowel is normal caliber and contains mild stool with no large bowel wall thickening. Vascular/Lymphatic: Atherosclerotic nonaneurysmal abdominal aorta. Patent portal, splenic, hepatic and renal veins. Mildly enlarged 1.3 cm right pericardiophrenic lymph node (series 3/image 15), new since 08/11/2011. No pathologically enlarged lymph nodes in the abdomen or pelvis. Reproductive: Status post prostatectomy. No discrete mass in the prostatectomy bed. Other: No pneumoperitoneum. There is extensive fat stranding and soft tissue caking throughout the omentum. There is irregular thickening and nodularity of the anterior pelvic peritoneal with a 9 mm anterior pelvic peritoneal nodule (series 3/image 78). There is small  volume ascites predominantly in the anterior pelvis. These findings are all new since 08/11/2011. Musculoskeletal: There is a lytic destructive expansile bone lesion in the right posterolateral eighth rib, new since 08/11/2011. No additional suspicious focal osseous lesions. Moderate thoracolumbar spondylosis. IMPRESSION: 1. Lytic destructive expansile bone lesion in the right posterolateral eighth rib, new since 08/11/2011, worrisome for bone metastasis. 2. Extensive fat stranding and soft tissue caking throughout the omentum. Irregular thickening and nodularity of the anterior pelvic peritoneum. Small volume ascites. These findings are all new since 08/11/2011 and worrisome for peritoneal carcinomatosis. 3. Scattered sites of mild irregular small bowel wall thickening in the mid and distal small bowel, potentially representing serosal metastatic disease given the above findings. Mild diffuse dilatation of the small bowel. Fluid levels throughout the dilated proximal and mid small bowel loops. Stool sign throughout the distal small bowel. No focal small bowel caliber transition. Findings could represent a mild distal small bowel obstruction due to suspected extrinsic/serosal metastatic disease versus adynamic ileus. 4. New mild right pericardiophrenic lymphadenopathy, nodal metastasis not excluded. 5. Additional findings include aortic atherosclerosis, coronary atherosclerosis and small hiatal hernia. These results were called by telephone at the time of interpretation on 03/01/2016 at 2:47 pm to Dr. Donald Prose , who verbally acknowledged these results. Electronically Signed   By: Ilona Sorrel M.D.   On: 03/01/2016 14:56    Assessment and Plan:   70 year old gentleman with the following issues:  1. 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 This was noted after presenting with symptoms of nausea, diarrhea and vomiting. He also endorses 12 pound weight loss.  The differential diagnosis  was discussed with the patient today and these findings are consistent with metastatic cancer involving the omentum as well as possibly a lytic lesion at the right eighth rib. This could be from a metastatic prostate cancer that is poorly differentiated as well as possibly melanoma. A third primary could also be a possibility arising from a GI source.  The next step of determining  the etiology of this malignancy would be an immediate biopsy. The biopsy will confirm the presence of malignancy as well as the potential site. Depending on the site of large and and the nature of the malignancy, this will dictate therapy. The therapy varies widely from prostate cancer to melanoma. Different agents can be utilized such as hormone deprivation versus systemic chemotherapy versus oral directed therapy depending on the nature of his cancer.  These findings represent a worrisome presentation that carries a poor prognosis regardless of the primary tumor. This was discussed with him today and he understands that we are possibly dealing with an incurable malignancy with the goal would be palliation at best. Further discussion about prognosis will take place after biopsy is completed.  I will also obtain a CT scan of the chest to complete the staging. He will follow-up after the biopsy to discuss these results.  2. Abdominal pain, vomiting: Related to his carcinomatosis he is currently using hydrocodone twice a day which seems to be managing his pain. I gave him signs of symptoms of small bowel obstruction and he is clear instructions to report to the emergency department if he is vomiting and unable to take oral intake.  3. History of prostate cancer: It is unclear if this is recurrence of his original prostate cancer but his PSA has been slowly rising. Androgen deprivation in addition to chemotherapy would be the treatment of this is indeed what we're dealing with.  4. History of melanoma: This has been resected  between 2-5 years on 2 separate occasions. These were performed at the [facility]. Details are not available to me of the actual pathology. We will attempt to obtain that for correlation.  5. Follow-up: Will be in the next week or so after pathology results are available from his biopsy.

## 2016-03-10 NOTE — Telephone Encounter (Signed)
Appointments scheduled per 03/10/16 los. AVS report and appointment schedule given to patient, per 03/10/16 los. °

## 2016-03-11 ENCOUNTER — Other Ambulatory Visit: Payer: Self-pay | Admitting: Physician Assistant

## 2016-03-11 ENCOUNTER — Other Ambulatory Visit: Payer: Self-pay | Admitting: Radiology

## 2016-03-11 ENCOUNTER — Telehealth: Payer: Self-pay

## 2016-03-11 ENCOUNTER — Telehealth: Payer: Self-pay | Admitting: Internal Medicine

## 2016-03-11 NOTE — Telephone Encounter (Signed)
Sorry to hear that! Does he have any low blood sugars? If not, we can wait until her next appointment later this month to make adjustments.

## 2016-03-11 NOTE — Telephone Encounter (Signed)
Called patient and advised of message from [place]. Patient has not had any low blood sugars, so I advised for him to keep his appointment for the 22nd. No other questions at this time.

## 2016-03-11 NOTE — Telephone Encounter (Signed)
[facility] Call: Caller states he has an appt 11/22 but has just seen an oncologist and was told to inform Dr. Cruzita Lederer of his weight loss. States he was dx with omental metastasis. States may need meds adjusted.

## 2016-03-14 ENCOUNTER — Encounter (HOSPITAL_COMMUNITY): Payer: Self-pay

## 2016-03-14 ENCOUNTER — Ambulatory Visit (HOSPITAL_COMMUNITY)
Admission: RE | Admit: 2016-03-14 | Discharge: 2016-03-14 | Disposition: A | Discharge status: Home / Self Care | Payer: PPO | Source: Ambulatory Visit | Attending: Oncology | Admitting: Oncology

## 2016-03-14 ENCOUNTER — Ambulatory Visit: Payer: PPO | Admitting: Oncology

## 2016-03-14 DIAGNOSIS — Z79899 Other long term (current) drug therapy: Secondary | ICD-10-CM | POA: Diagnosis not present

## 2016-03-14 DIAGNOSIS — C7951 Secondary malignant neoplasm of bone: Secondary | ICD-10-CM | POA: Diagnosis not present

## 2016-03-14 DIAGNOSIS — K219 Gastro-esophageal reflux disease without esophagitis: Secondary | ICD-10-CM | POA: Diagnosis not present

## 2016-03-14 DIAGNOSIS — R1909 Other intra-abdominal and pelvic swelling, mass and lump: Secondary | ICD-10-CM | POA: Diagnosis not present

## 2016-03-14 DIAGNOSIS — C786 Secondary malignant neoplasm of retroperitoneum and peritoneum: Secondary | ICD-10-CM | POA: Diagnosis not present

## 2016-03-14 DIAGNOSIS — C61 Malignant neoplasm of prostate: Secondary | ICD-10-CM | POA: Diagnosis not present

## 2016-03-14 DIAGNOSIS — N400 Enlarged prostate without lower urinary tract symptoms: Secondary | ICD-10-CM | POA: Insufficient documentation

## 2016-03-14 DIAGNOSIS — Z87891 Personal history of nicotine dependence: Secondary | ICD-10-CM | POA: Insufficient documentation

## 2016-03-14 DIAGNOSIS — Z7984 Long term (current) use of oral hypoglycemic drugs: Secondary | ICD-10-CM | POA: Diagnosis not present

## 2016-03-14 DIAGNOSIS — E119 Type 2 diabetes mellitus without complications: Secondary | ICD-10-CM | POA: Insufficient documentation

## 2016-03-14 DIAGNOSIS — Z7982 Long term (current) use of aspirin: Secondary | ICD-10-CM | POA: Diagnosis not present

## 2016-03-14 DIAGNOSIS — C801 Malignant (primary) neoplasm, unspecified: Secondary | ICD-10-CM | POA: Diagnosis not present

## 2016-03-14 DIAGNOSIS — E7800 Pure hypercholesterolemia, unspecified: Secondary | ICD-10-CM | POA: Insufficient documentation

## 2016-03-14 LAB — PROTIME-INR
INR: 1.11
Prothrombin Time: 14.4 seconds (ref 11.4–15.2)

## 2016-03-14 LAB — CBC
HEMATOCRIT: 38.5 % — AB (ref 39.0–52.0)
Hemoglobin: 12.3 g/dL — ABNORMAL LOW (ref 13.0–17.0)
MCH: 26.2 pg (ref 26.0–34.0)
MCHC: 31.9 g/dL (ref 30.0–36.0)
MCV: 82.1 fL (ref 78.0–100.0)
Platelets: 501 10*3/uL — ABNORMAL HIGH (ref 150–400)
RBC: 4.69 MIL/uL (ref 4.22–5.81)
RDW: 14.2 % (ref 11.5–15.5)
WBC: 7.8 10*3/uL (ref 4.0–10.5)

## 2016-03-14 LAB — GLUCOSE, CAPILLARY
GLUCOSE-CAPILLARY: 166 mg/dL — AB (ref 65–99)
GLUCOSE-CAPILLARY: 135 mg/dL — AB (ref 65–99)

## 2016-03-14 MED ORDER — NALOXONE HCL 0.4 MG/ML IJ SOLN
INTRAMUSCULAR | Status: AC
  Filled 2016-03-14: qty 1

## 2016-03-14 MED ORDER — SODIUM CHLORIDE 0.9 % IV SOLN: INTRAVENOUS | Status: DC

## 2016-03-14 MED ORDER — FENTANYL CITRATE (PF) 100 MCG/2ML IJ SOLN
INTRAMUSCULAR | Status: AC
  Filled 2016-03-14: qty 4

## 2016-03-14 MED ORDER — FENTANYL CITRATE (PF) 100 MCG/2ML IJ SOLN
INTRAMUSCULAR | Status: AC | PRN
  Administered 2016-03-14 (×3): 50 ug via INTRAVENOUS

## 2016-03-14 MED ORDER — FLUMAZENIL 0.5 MG/5ML IV SOLN
INTRAVENOUS | Status: AC
  Filled 2016-03-14: qty 5

## 2016-03-14 MED ORDER — MIDAZOLAM HCL 2 MG/2ML IJ SOLN
INTRAMUSCULAR | Status: AC
  Filled 2016-03-14: qty 6

## 2016-03-14 MED ORDER — MIDAZOLAM HCL 2 MG/2ML IJ SOLN
INTRAMUSCULAR | Status: AC | PRN
  Administered 2016-03-14 (×4): 1 mg via INTRAVENOUS

## 2016-03-14 MED ORDER — LIDOCAINE HCL 1 % IJ SOLN
INTRAMUSCULAR | Status: AC
  Filled 2016-03-14: qty 20

## 2016-03-14 NOTE — H&P (Signed)
Chief Complaint: Patient was seen in consultation today for omental mass biopsy at the request of Shadad,Firas N  Referring Physician(s): Wyatt Portela  Supervising Physician: Corrie Mckusick  Patient Status: [facility] - Out-pt  History of Present Illness: Frank Larsen is a 70 y.o. male   Abdominal pain N/V Abdominal distension noted for weeks ED visit 03/01/16 and work up reveals CT Ab/Pel 10/24: IMPRESSION: 1. Lytic destructive expansile bone lesion in the right posterolateral eighth rib, new since 08/11/2011, worrisome for bone metastasis. 2. Extensive fat stranding and soft tissue caking throughout the omentum. Irregular thickening and nodularity of the anterior pelvic peritoneum. Small volume ascites. These findings are all new since 08/11/2011 and worrisome for peritoneal carcinomatosis. 3. Scattered sites of mild irregular small bowel wall thickening in the mid and distal small bowel, potentially representing serosal metastatic disease given the above findings. Mild diffuse dilatation of the small bowel. Fluid levels throughout the dilated proximal and mid small bowel loops. Stool sign throughout the distal small bowel. No focal small bowel caliber transition. Findings could represent a mild distal small bowel obstruction due to suspected extrinsic/serosal metastatic disease versus adynamic ileus. 4. New mild right pericardiophrenic lymphadenopathy, nodal metastasis not excluded. 5. Additional findings include aortic atherosclerosis  Request now for omental mass biopsy Hx Prostate Ca 2013 Neck melanoma 2012; face melanoma 2015  Past Medical History:  Diagnosis Date  . AK (actinic keratosis)   . Arthritis   . BPH (benign prostatic hyperplasia)   . Chronic low back pain    since 1979  . Diabetes mellitus ORAL MED   type 2  . Elevated PSA   . GERD (gastroesophageal reflux disease)   . Hearing loss    using bilateral hearing aids  . History of radiation  therapy 01/11/14- 03/04/14   prostate bed 6600 cGy 33 sessions  . Hypercholesterolemia   . Hypercholesterolemia   . Hyperlipidemia   . Impaired hearing BILATERAL HEARING AIDS   20% RIGHT HEARING DUE TO VIRUS  . Melanoma ([place])   . Normal cardiac stress test 2010  . Prostate cancer ([place]) 07/20/11   Gleason 9  . Prostate cancer ([place])   . Prostate nodule   . Skin cancer    malignant melanoma of skin   . Tinnitus    stable since 1992  . Tobacco use     Past Surgical History:  Procedure Laterality Date  . APPENDECTOMY  AGE 45  . CARPOMETACARPAL JOINT ARTHROTOMY  03-22-2006   RIGHT THUMB  . CARPOMETACARPAL JOINT ARTHROTOMY  12-21-2005   LEFT THUMB  . MELANOMA EXCISION  07-12-10   RIGHT NECK  . PROSTATE BIOPSY  07/20/2011   Procedure: BIOPSY TRANSRECTAL ULTRASONIC PROSTATE (TUBP);  Surgeon: Molli Hazard, MD;  Location: [facility];  Service: Urology;  Laterality: N/A;  1 hour requested for this case  Saturation BX  OFFICE TO BRING ULTRASOUND MACHINE    . ROBOT ASSISTED LAPAROSCOPIC RADICAL PROSTATECTOMY  10/19/2011   Procedure: ROBOTIC ASSISTED LAPAROSCOPIC RADICAL PROSTATECTOMY;  Surgeon: Molli Hazard, MD;  Location: [facility];  Service: Urology;  Laterality: N/A;      . SHOULDER OPEN ROTATOR CUFF REPAIR  07-29-2010- RIGHT   AND SUBACROMIAL DECOMPRESSION AROMIOPLASTY  . TONSILLECTOMY  AGE 51    Allergies: Scallops [shellfish allergy] and Cephalexin  Medications: Prior to Admission medications   Medication Sig Start Date End Date Taking? Authorizing Provider  aspirin 325 MG tablet Take 325 mg by mouth daily with breakfast.  Yes Historical Provider, MD  atorvastatin (LIPITOR) 10 MG tablet Take 10 mg by mouth daily with breakfast.    Yes Historical Provider, MD  glipiZIDE (GLUCOTROL XL) 5 MG 24 hr tablet Take 1 tablet (5 mg total) by mouth daily with breakfast. 12/29/15  Yes Philemon Kingdom, MD  HYDROcodone-acetaminophen (NORCO/VICODIN) 5-325 MG  tablet Take 1-2 tablets by mouth every 6 (six) hours as needed for moderate pain. 03/05/16  Yes Daryl F de Villier II, PA  losartan (COZAAR) 25 MG tablet Take 25 mg by mouth daily.   Yes Historical Provider, MD  metFORMIN (GLUCOPHAGE) 1000 MG tablet Take 1 tablet (1,000 mg total) by mouth 2 (two) times daily with a meal. 01/25/16  Yes Philemon Kingdom, MD  Multiple Vitamin (MULTIVITAMIN) tablet Take 1 tablet by mouth daily.   Yes Historical Provider, MD  omeprazole (PRILOSEC) 20 MG capsule Take 20 mg by mouth every morning.   Yes Historical Provider, MD  pioglitazone (ACTOS) 30 MG tablet Take 1 tablet (30 mg total) by mouth daily. 01/25/16  Yes Philemon Kingdom, MD  pseudoephedrine-acetaminophen (TYLENOL SINUS) 30-500 MG TABS tablet Take 1 tablet by mouth every 4 (four) hours as needed (congestion).    Yes Historical Provider, MD  sitaGLIPtin (JANUVIA) 100 MG tablet Take 100 mg by mouth daily.   Yes Historical Provider, MD  Blood Glucose Monitoring Suppl (ONE TOUCH ULTRA 2) w/Device KIT Use to check blood sugar 2 times per day. 10/30/15   Elayne Snare, MD  glucose blood (ONE TOUCH ULTRA TEST) test strip Use to check blood sugar 2 times per day. 01/25/16   Philemon Kingdom, MD  [facility] DELICA LANCETS 61W MISC Use to check blood sugar 2 times per day. 01/27/16   Philemon Kingdom, MD     Family History  Problem Relation Age of Onset  . Cancer Mother     breast/lived 20 years post  . Alzheimer's disease Mother     deceased  . Cancer Brother     thyroid  . Heart disease Brother   . Stroke Father     80 years old  . Hypertension Father   . Alcoholism Father   . CVA Father   . Cirrhosis Paternal Grandfather     alcohol-related  . Cancer Paternal Grandmother     unknown type     Social History   Social History  . Marital status: Married    Spouse name: N/A  . Number of children: N/A  . Years of education: N/A   Occupational History  . Retired     Public relations account executive   Social History Main  Topics  . Smoking status: Former Smoker    Packs/day: 1.00    Years: 43.00    Types: Cigarettes    Quit date: 12/07/2007  . Smokeless tobacco: Never Used  . Alcohol use Yes     Comment: OCCASIONAL  . Drug use: No  . Sexual activity: Not Asked   Other Topics Concern  . None   Social History Narrative  . None     Review of Systems: A 12 point ROS discussed and pertinent positives are indicated in the HPI above.  All other systems are negative.  Review of Systems  Constitutional: Positive for appetite change and fatigue. Negative for activity change and fever.  Respiratory: Negative for cough and shortness of breath.   Cardiovascular: Negative for chest pain.  Gastrointestinal: Positive for abdominal distention, abdominal pain, diarrhea, nausea and vomiting.  Neurological: Positive for weakness.  Psychiatric/Behavioral: Negative  for behavioral problems and confusion.    Vital Signs: BP 138/79   Pulse 82   Temp 97.9 F (36.6 C) (Oral)   Ht 5' 10.5" (1.791 m)   Wt 184 lb (83.5 kg)   SpO2 97%   BMI 26.03 kg/m   Physical Exam  Constitutional: He is oriented to person, place, and time.  Cardiovascular: Normal rate, regular rhythm and normal heart sounds.   Pulmonary/Chest: Effort normal and breath sounds normal.  Abdominal: Soft. Bowel sounds are normal. He exhibits distension. There is tenderness.  Musculoskeletal: Normal range of motion.  Neurological: He is alert and oriented to person, place, and time.  Skin: Skin is warm and dry.  Psychiatric: He has a normal mood and affect. His behavior is normal. Judgment and thought content normal.  Nursing note and vitals reviewed.   Mallampati Score:  MD Evaluation Airway: WNL Heart: WNL Abdomen: WNL Chest/ Lungs: WNL ASA  Classification: 3 Mallampati/Airway Score: One  Imaging: Ct Abdomen Pelvis W Contrast  Result Date: 03/01/2016 CLINICAL DATA:  Mid abdominal pain, nausea and vomiting since earlier this morning.  History of prostate cancer status post prostatectomy 10/19/2011. Prior appendectomy. EXAM: CT ABDOMEN AND PELVIS WITH CONTRAST TECHNIQUE: Multidetector CT imaging of the abdomen and pelvis was performed using the standard protocol following bolus administration of intravenous contrast. Creatinine was obtained on site at [facility] at [address] Results: Creatinine 0.7 mg/dL. CONTRAST:  154m ISOVUE-300 IOPAMIDOL (ISOVUE-300) INJECTION 61% COMPARISON:  08/11/2011 CT abdomen/ pelvis.  09/06/2013 CT pelvis. FINDINGS: Lower chest: No significant pulmonary nodules or acute consolidative airspace disease. Coronary atherosclerosis. Hepatobiliary: Normal liver size. Hypodense 0.7 cm lateral segment left liver lobe lesion, too small characterize, stable in size since 08/11/2011, consistent with a benign lesion. No additional liver lesions. Normal gallbladder with no radiopaque cholelithiasis. No biliary ductal dilatation. Pancreas: Stable coarse subcentimeter calcification in the pancreatic tail, nonspecific, possibly vascular or the sequela of prior pancreatitis. Otherwise normal pancreas with no pancreatic mass and no pancreatic duct dilation. Spleen: Normal size. No mass. Adrenals/Urinary Tract: Normal adrenals. No hydronephrosis. At least partial duplication of the left renal collecting system. No renal masses. Completely collapsed and grossly normal bladder. Stomach/Bowel: Small hiatal hernia. Otherwise grossly normal stomach. Mild diffuse small bowel dilatation. There are fluid levels throughout the dilated proximal and mid small bowel loops. There is small bowel stool sign throughout the distal and terminal ileum. No focal small bowel caliber transition. There is scattered irregular mild small bowel wall thickening noted in the mid (series 3/ image 55) and distal small bowel (series 3/ image 72). Appendectomy. Large bowel is normal caliber and contains mild stool with no large bowel wall thickening.  Vascular/Lymphatic: Atherosclerotic nonaneurysmal abdominal aorta. Patent portal, splenic, hepatic and renal veins. Mildly enlarged 1.3 cm right pericardiophrenic lymph node (series 3/image 15), new since 08/11/2011. No pathologically enlarged lymph nodes in the abdomen or pelvis. Reproductive: Status post prostatectomy. No discrete mass in the prostatectomy bed. Other: No pneumoperitoneum. There is extensive fat stranding and soft tissue caking throughout the omentum. There is irregular thickening and nodularity of the anterior pelvic peritoneal with a 9 mm anterior pelvic peritoneal nodule (series 3/image 78). There is small volume ascites predominantly in the anterior pelvis. These findings are all new since 08/11/2011. Musculoskeletal: There is a lytic destructive expansile bone lesion in the right posterolateral eighth rib, new since 08/11/2011. No additional suspicious focal osseous lesions. Moderate thoracolumbar spondylosis. IMPRESSION: 1. Lytic destructive expansile bone lesion in the  right posterolateral eighth rib, new since 08/11/2011, worrisome for bone metastasis. 2. Extensive fat stranding and soft tissue caking throughout the omentum. Irregular thickening and nodularity of the anterior pelvic peritoneum. Small volume ascites. These findings are all new since 08/11/2011 and worrisome for peritoneal carcinomatosis. 3. Scattered sites of mild irregular small bowel wall thickening in the mid and distal small bowel, potentially representing serosal metastatic disease given the above findings. Mild diffuse dilatation of the small bowel. Fluid levels throughout the dilated proximal and mid small bowel loops. Stool sign throughout the distal small bowel. No focal small bowel caliber transition. Findings could represent a mild distal small bowel obstruction due to suspected extrinsic/serosal metastatic disease versus adynamic ileus. 4. New mild right pericardiophrenic lymphadenopathy, nodal metastasis not  excluded. 5. Additional findings include aortic atherosclerosis, coronary atherosclerosis and small hiatal hernia. These results were called by telephone at the time of interpretation on 03/01/2016 at 2:47 pm to Dr. Donald Prose , who verbally acknowledged these results. Electronically Signed   By: Ilona Sorrel M.D.   On: 03/01/2016 14:56    Labs:  CBC:  Recent Labs  03/05/16 1459 03/14/16 0735  WBC 6.1 7.8  HGB 12.0* 12.3*  HCT 38.4* 38.5*  PLT 352 501*    COAGS:  Recent Labs  03/14/16 0735  INR 1.11    BMP:  Recent Labs  03/05/16 1459  NA 136  K 4.1  CL 102  CO2 22  GLUCOSE 205*  BUN 28*  CALCIUM 9.2  CREATININE 0.95  GFRNONAA >60  GFRAA >60    LIVER FUNCTION TESTS:  Recent Labs  03/05/16 1459  BILITOT 0.8  AST 21  ALT 17  ALKPHOS 42  PROT 7.6  ALBUMIN 4.1    TUMOR MARKERS: No results for input(s): AFPTM, CEA, CA199, CHROMGRNA in the last 8760 hours.  Assessment and Plan:  Hx prostate Ca Hx face and neck melanoma abd pain and distension Ommental mass and bony metastasis and LAN Now scheduled for omental mass biopsy Risks and Benefits discussed with the patient including, but not limited to bleeding, infection, damage to adjacent structures or low yield requiring additional tests. All of the patient's questions were answered, patient is agreeable to proceed. Consent signed and in chart.   Thank you for this interesting consult.  I greatly enjoyed meeting GARYN ARLOTTA and look forward to participating in their care.  A copy of this report was sent to the requesting provider on this date.  Electronically Signed: Travonna Swindle A 03/14/2016, 8:49 AM   I spent a total of  30 Minutes   in face to face in clinical consultation, greater than 50% of which was counseling/coordinating care for omental mass bx

## 2016-03-14 NOTE — Discharge Instructions (Signed)
Needle Biopsy, Care After °These instructions give you information about caring for yourself after your procedure. Your doctor may also give you more specific instructions. Call your doctor if you have any problems or questions after your procedure. °HOME CARE °· Rest as told by your doctor. °· Take medicines only as told by your doctor. °· There are many different ways to close and cover the biopsy site, including stitches (sutures), skin glue, and adhesive strips. Follow instructions from your doctor about: °¨ How to take care of your biopsy site. °¨ When and how you should change your bandage (dressing). °¨ When you should remove your dressing. °¨ Removing whatever was used to close your biopsy site. °· Check your biopsy site every day for signs of infection. Watch for: °¨ Redness, swelling, or pain. °¨ Fluid, blood, or pus. °GET HELP IF: °· You have a fever. °· You have redness, swelling, or pain at the biopsy site, and it lasts longer than a few days. °· You have fluid, blood, or pus coming from the biopsy site. °· You feel sick to your stomach (nauseous). °· You throw up (vomit). °GET HELP RIGHT AWAY IF: °· You are short of breath. °· You have trouble breathing. °· Your chest hurts. °· You feel dizzy or you pass out (faint). °· You have bleeding that does not stop with pressure or a bandage. °· You cough up blood. °· Your belly (abdomen) hurts. °  °This information is not intended to replace advice given to you by your health care provider. Make sure you discuss any questions you have with your health care provider. °  °Document Released: 04/07/2008 Document Revised: 09/09/2014 Document Reviewed: 04/21/2014 °Elsevier Interactive Patient Education ©2016 [employer] ° °

## 2016-03-14 NOTE — Procedures (Signed)
Interventional Radiology Procedure Note  Procedure: CT guided biopsy of omentum/omental caking.  Mx 18g core biopsy of right omentum.  .  Complications: None Recommendations:  - Ok to shower tomorrow - Do not submerge for 7 days - Routine care - follow up pathology   Signed,  Dulcy Fanny. Earleen Newport, DO

## 2016-03-17 ENCOUNTER — Telehealth: Payer: Self-pay | Admitting: Oncology

## 2016-03-17 ENCOUNTER — Ambulatory Visit (HOSPITAL_BASED_OUTPATIENT_CLINIC_OR_DEPARTMENT_OTHER): Payer: PPO | Admitting: Oncology

## 2016-03-17 VITALS — BP 107/62 | HR 91 | Temp 97.7°F | Resp 18 | Ht 70.5 in | Wt 178.8 lb

## 2016-03-17 DIAGNOSIS — Z85820 Personal history of malignant melanoma of skin: Secondary | ICD-10-CM | POA: Diagnosis not present

## 2016-03-17 DIAGNOSIS — Z8546 Personal history of malignant neoplasm of prostate: Secondary | ICD-10-CM

## 2016-03-17 DIAGNOSIS — C800 Disseminated malignant neoplasm, unspecified: Secondary | ICD-10-CM

## 2016-03-17 DIAGNOSIS — C61 Malignant neoplasm of prostate: Secondary | ICD-10-CM

## 2016-03-17 MED ORDER — PROCHLORPERAZINE MALEATE 10 MG PO TABS: 10.0000 mg | ORAL_TABLET | Freq: Four times a day (QID) | ORAL | 0 refills | Status: DC | PRN

## 2016-03-17 MED ORDER — LIDOCAINE-PRILOCAINE 2.5-2.5 % EX CREA: 1.0000 "application " | TOPICAL_CREAM | CUTANEOUS | 0 refills | Status: DC | PRN

## 2016-03-17 NOTE — Progress Notes (Signed)
START ON PATHWAY REGIMEN - Prostate  POS37: Docetaxel 75 mg/m2 q21 Days + Prednisone 5 mg BID Until Progression or Toxicity   A cycle is every 21 days:     Docetaxel (Taxotere(R)) 75 mg/m2 in 250 mL NS IV over one hour Dose Mod: None     Prednisone 5 mg orally twice daily Dose Mod: None Additional Orders: Premedicate with dexamethasone 8 mg PO bid for three days beginning 1 day prior to therapy  **Always confirm dose/schedule in your pharmacy ordering system**    Patient Characteristics: Adenocarcinoma, Metastatic, Castration Resistant, Symptomatic, Docetaxel Eligible AJCC T Stage: X AJCC Stage Grouping: IV Current radiographic evidence of distant metastasis? Yes PSA: X Gleason Primary: X Gleason Secondary: X Gleason Score: X AJCC M Stage: X AJCC N Stage: X Would you be surprised if this patient died  in the next year? I would be surprised if this patient died in the next year  Intent of Therapy: Non-Curative / Palliative Intent, Discussed with Patient

## 2016-03-17 NOTE — Progress Notes (Signed)
Hematology and Oncology Follow Up Visit  Frank Larsen [id number] 11/14/1945 70 y.o. 03/17/2016 4:00 PM SWAYNE,DAVID W, MDSwayne, David, MD   Principle Diagnosis: Prostate cancer diagnosed in 2013 after presenting with a PSA of 3.6 and a Gleason score 5+4 = 9. He has peritoneal carcinomatosis biopsy proven to be adenocarcinoma likely of a prostate primary.   Prior Therapy: He is status post robotic-assisted laparoscopic radical prostatectomy and extended lymphadenectomy with the pathology reveals T2N0.  completed in June 2013.  Current therapy: Under evaluation for salvage systemic therapy.  Interim History: Mr. Eisinger presents today for a follow-up visit. Since the last visit, he underwent an omental biopsy completed on 03/14/2016 and tolerated the procedure very well. He is symptoms did not change dramatically over the last week. He denied any nausea, vomiting and his abdominal pain have been reasonably unchanged. He does not require any pain medication at this time. His by mouth intake has been poor and of lost more weight. He denied any vomiting but does report occasional diarrhea. His abdominal distention has about the same slightly increased.   He does not report any headaches, blurry vision, syncope or seizures. He does not report any fevers, chills, sweats but does report weight loss and poor by mouth intake. He does not report any chest pain, palpitation, orthopnea or leg edema. He does not report any cough, wheezing or hemoptysis. He does not report any hematochezia, melena or diarrhea at this time. He has not moved his bowels last few days. He does not report any frequency, urgency or hematuria. He does not report any skeletal complaints. Remaining review of systems and   Medications: I have reviewed the patient's current medications.  Current Outpatient Prescriptions  Medication Sig Dispense Refill  . aspirin 325 MG tablet Take 325 mg by mouth daily with breakfast.     .  atorvastatin (LIPITOR) 10 MG tablet Take 10 mg by mouth daily with breakfast.     . Blood Glucose Monitoring Suppl (ONE TOUCH ULTRA 2) w/Device KIT Use to check blood sugar 2 times per day. 1 each 2  . glipiZIDE (GLUCOTROL XL) 5 MG 24 hr tablet Take 1 tablet (5 mg total) by mouth daily with breakfast. 90 tablet 0  . glucose blood (ONE TOUCH ULTRA TEST) test strip Use to check blood sugar 2 times per day. 200 each 5  . HYDROcodone-acetaminophen (NORCO/VICODIN) 5-325 MG tablet Take 1-2 tablets by mouth every 6 (six) hours as needed for moderate pain. 20 tablet 0  . lidocaine-prilocaine (EMLA) cream Apply 1 application topically as needed. Apply to port before chemotherapy. 30 g 0  . metFORMIN (GLUCOPHAGE) 1000 MG tablet Take 1 tablet (1,000 mg total) by mouth 2 (two) times daily with a meal. 90 tablet 0  . Multiple Vitamin (MULTIVITAMIN) tablet Take 1 tablet by mouth daily.    . omeprazole (PRILOSEC) 20 MG capsule Take 20 mg by mouth every morning.    . ONETOUCH DELICA LANCETS 33G MISC Use to check blood sugar 2 times per day. 200 each 2  . pioglitazone (ACTOS) 30 MG tablet Take 1 tablet (30 mg total) by mouth daily. 90 tablet 0  . prochlorperazine (COMPAZINE) 10 MG tablet Take 1 tablet (10 mg total) by mouth every 6 (six) hours as needed for nausea or vomiting. 30 tablet 0  . pseudoephedrine-acetaminophen (TYLENOL SINUS) 30-500 MG TABS tablet Take 1 tablet by mouth every 4 (four) hours as needed (congestion).     . sitaGLIPtin (JANUVIA) 100 MG tablet   Take 100 mg by mouth daily.     No current facility-administered medications for this visit.      Allergies:  Allergies  Allergen Reactions  . Scallops [Shellfish Allergy] Nausea And Vomiting  . Cephalexin Hives and Rash    Past Medical History, Surgical history, Social history, and Family History were reviewed and updated.   Physical Exam: Blood pressure 107/62, pulse 91, temperature 97.7 F (36.5 C), temperature source Oral, resp. rate  18, height 5' 10.5" (1.791 m), weight 178 lb 12.8 oz (81.1 kg), SpO2 99 %. ECOG: 1 General appearance: alert and cooperative appeared without distress.  Head: Normocephalic, without obvious abnormality no oral ulcers or lesions. Neck: no adenopathy Lymph nodes: Cervical, supraclavicular, and axillary nodes normal. Heart:regular rate and rhythm, S1, S2 normal, no murmur, click, rub or gallop Lung:chest clear, no wheezing, rales, normal symmetric air entry Abdomin: soft, without masses or organomegaly. Distended with good bowel sounds. Nontender on deep palpation. EXT:no erythema, induration, or nodules   Lab Results: Lab Results  Component Value Date   WBC 7.8 03/14/2016   HGB 12.3 (L) 03/14/2016   HCT 38.5 (L) 03/14/2016   MCV 82.1 03/14/2016   PLT 501 (H) 03/14/2016     Chemistry      Component Value Date/Time   NA 136 03/05/2016 1459   K 4.1 03/05/2016 1459   CL 102 03/05/2016 1459   CO2 22 03/05/2016 1459   BUN 28 (H) 03/05/2016 1459   CREATININE 0.95 03/05/2016 1459   GLU 155 08/24/2015      Component Value Date/Time   CALCIUM 9.2 03/05/2016 1459   ALKPHOS 42 03/05/2016 1459   AST 21 03/05/2016 1459   ALT 17 03/05/2016 1459   BILITOT 0.8 03/05/2016 1459      Diagnosis Soft Tissue Needle Core Biopsy, Omentum - METASTATIC CARCINOMA. - SEE COMMENT. Microscopic Comment The malignant cells are positive for cytokeratin AE1/AE3 and negative for PSA, prostein, S100, HMB45, and Melan-A. The findings are consistent with metastatic carcinoma, and despite the negative staining for PSA and protein, given the patient's clinical history, I favor that this represents metastatic prostate carcinoma. Dr Shadad was paged on 03/16/2016. The block will be sent to [facility], per clinician request. (JBK:ecj 03/16/2016)  Impression and Plan:   70-year-old gentleman with the following issues:  1. 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 This was noted after  presenting with symptoms of nausea, diarrhea and vomiting. He reports poor by mouth intake and weight loss. He does have history of prostate cancer Gleason score 5+4 = 9 resected in 2013.  He is status post biopsy obtained on 03/14/2016 and the final pathology showed metastatic carcinoma that is poorly differentiated that favors prostate cancer. Although the specimen did not stain for PSA his tumor is poorly differentiated likely secretes very little to no PSA.  The ramifications of this finding was discussed with the patient and his wife today. He has clearly an incurable malignancy but certainly a malignancy but potentially can be palliated. I am in favor of treating him with systemic chemotherapy urgently in attempts to decrease his disease volume and palliate his symptoms. Single agent Taxotere has been effective in this particular setting although adding carboplatin can be used in the future.  Complications associated with single agent Taxotere was discussed. These complications include nausea, vomiting, myelosuppression, peripheral neuropathy, edema and infusion related complications. He is agreeable to proceed in the near future after chemotherapy education class. He is traveling to [place]   for a funeral next week and would like to start the following week.  His PSA has been rising slowly and I think would be reasonable to add androgen deprivation at some point as well.  2. Abdominal pain: Appears to be stable at this time he does have hydrocodone to use as needed. We have discussed different strategies to improve his nutritional status given his omental involvement which includes frequent small meals as well as soft diet.  3. Antiemetics: Prescription for Compazine was made available to the patient.  4. IV access: Risks and benefits of a Port-A-Cath insertion was reviewed. Complications includes bleeding, thrombosis, infection among others. He is agreeable to have a place in the near  future.  5. History of melanoma: It appears inactive at this time. His pathology did not support that diagnosis.  6. Follow-up: Will be in the next 2 weeks to start systemic chemotherapy.    ZOXWRU,EAVWU, MD 11/9/20174:00 PM

## 2016-03-17 NOTE — Telephone Encounter (Signed)
Message sent to chemo scheduler to add chemo per 03/17/16 los. Appointment rschd from 11/21 & 11/22 to 11/22 & 11/24 per 03/17/16 los Appointments scheduled per 03/17/16 los. AVS report and appointment schedule given to patient per 03/17/16 los.

## 2016-03-18 ENCOUNTER — Telehealth: Payer: Self-pay | Admitting: *Deleted

## 2016-03-18 ENCOUNTER — Ambulatory Visit: Payer: PPO | Admitting: Oncology

## 2016-03-18 ENCOUNTER — Other Ambulatory Visit: Payer: PPO

## 2016-03-18 ENCOUNTER — Encounter: Payer: Self-pay | Admitting: *Deleted

## 2016-03-18 NOTE — Telephone Encounter (Signed)
Per LOS I have scheduled appts and notified scheduler 

## 2016-03-18 NOTE — Telephone Encounter (Signed)
Education nurse Tylene Fantasia requesting nutritional consult for 20 lb weight loss. Lm with barb neff to contact patient.

## 2016-03-20 ENCOUNTER — Other Ambulatory Visit: Payer: Self-pay | Admitting: Radiology

## 2016-03-21 ENCOUNTER — Ambulatory Visit (HOSPITAL_COMMUNITY)
Admission: RE | Admit: 2016-03-21 | Discharge: 2016-03-21 | Disposition: A | Discharge status: Home / Self Care | Payer: PPO | Source: Ambulatory Visit | Attending: Oncology | Admitting: Oncology

## 2016-03-21 ENCOUNTER — Encounter (HOSPITAL_COMMUNITY): Payer: Self-pay

## 2016-03-21 DIAGNOSIS — I700 Atherosclerosis of aorta: Secondary | ICD-10-CM | POA: Diagnosis not present

## 2016-03-21 DIAGNOSIS — C61 Malignant neoplasm of prostate: Secondary | ICD-10-CM | POA: Diagnosis not present

## 2016-03-21 DIAGNOSIS — C786 Secondary malignant neoplasm of retroperitoneum and peritoneum: Secondary | ICD-10-CM | POA: Insufficient documentation

## 2016-03-21 DIAGNOSIS — I2510 Atherosclerotic heart disease of native coronary artery without angina pectoris: Secondary | ICD-10-CM | POA: Diagnosis not present

## 2016-03-21 MED ORDER — IOPAMIDOL (ISOVUE-300) INJECTION 61%
75.0000 mL | Freq: Once | INTRAVENOUS | Status: AC | PRN
  Administered 2016-03-21: 75 mL via INTRAVENOUS

## 2016-03-22 ENCOUNTER — Ambulatory Visit (HOSPITAL_COMMUNITY)
Admission: RE | Admit: 2016-03-22 | Discharge: 2016-03-22 | Disposition: A | Discharge status: Home / Self Care | Payer: PPO | Source: Ambulatory Visit | Attending: Oncology | Admitting: Oncology

## 2016-03-22 ENCOUNTER — Telehealth: Payer: Self-pay

## 2016-03-22 ENCOUNTER — Other Ambulatory Visit: Payer: Self-pay | Admitting: Oncology

## 2016-03-22 ENCOUNTER — Encounter (HOSPITAL_COMMUNITY): Payer: Self-pay

## 2016-03-22 ENCOUNTER — Ambulatory Visit: Payer: PPO | Admitting: Oncology

## 2016-03-22 ENCOUNTER — Ambulatory Visit (HOSPITAL_COMMUNITY)
Admission: RE | Admit: 2016-03-22 | Discharge: 2016-03-22 | Disposition: A | Discharge status: Home / Self Care | Payer: PPO | Source: Ambulatory Visit | Attending: Interventional Radiology | Admitting: Interventional Radiology

## 2016-03-22 DIAGNOSIS — R590 Localized enlarged lymph nodes: Secondary | ICD-10-CM | POA: Diagnosis not present

## 2016-03-22 DIAGNOSIS — Z923 Personal history of irradiation: Secondary | ICD-10-CM | POA: Diagnosis not present

## 2016-03-22 DIAGNOSIS — Z85820 Personal history of malignant melanoma of skin: Secondary | ICD-10-CM | POA: Diagnosis not present

## 2016-03-22 DIAGNOSIS — Z9079 Acquired absence of other genital organ(s): Secondary | ICD-10-CM | POA: Diagnosis not present

## 2016-03-22 DIAGNOSIS — Z7982 Long term (current) use of aspirin: Secondary | ICD-10-CM | POA: Diagnosis not present

## 2016-03-22 DIAGNOSIS — C61 Malignant neoplasm of prostate: Secondary | ICD-10-CM | POA: Diagnosis not present

## 2016-03-22 DIAGNOSIS — E7800 Pure hypercholesterolemia, unspecified: Secondary | ICD-10-CM | POA: Diagnosis not present

## 2016-03-22 DIAGNOSIS — H9193 Unspecified hearing loss, bilateral: Secondary | ICD-10-CM | POA: Diagnosis not present

## 2016-03-22 DIAGNOSIS — Z7984 Long term (current) use of oral hypoglycemic drugs: Secondary | ICD-10-CM | POA: Diagnosis not present

## 2016-03-22 DIAGNOSIS — E119 Type 2 diabetes mellitus without complications: Secondary | ICD-10-CM | POA: Insufficient documentation

## 2016-03-22 DIAGNOSIS — Z974 Presence of external hearing-aid: Secondary | ICD-10-CM | POA: Diagnosis not present

## 2016-03-22 DIAGNOSIS — Z5111 Encounter for antineoplastic chemotherapy: Secondary | ICD-10-CM | POA: Diagnosis not present

## 2016-03-22 DIAGNOSIS — Z79899 Other long term (current) drug therapy: Secondary | ICD-10-CM | POA: Insufficient documentation

## 2016-03-22 HISTORY — PX: IR GENERIC HISTORICAL: IMG1180011

## 2016-03-22 LAB — CBC WITH DIFFERENTIAL/PLATELET
Basophils Absolute: 0 10*3/uL (ref 0.0–0.1)
Basophils Relative: 0 %
Eosinophils Absolute: 0.1 10*3/uL (ref 0.0–0.7)
Eosinophils Relative: 2 %
HCT: 35.2 % — ABNORMAL LOW (ref 39.0–52.0)
Hemoglobin: 11 g/dL — ABNORMAL LOW (ref 13.0–17.0)
Lymphocytes Relative: 19 %
Lymphs Abs: 1.5 10*3/uL (ref 0.7–4.0)
MCH: 26.8 pg (ref 26.0–34.0)
MCHC: 31.3 g/dL (ref 30.0–36.0)
MCV: 85.9 fL (ref 78.0–100.0)
Monocytes Absolute: 0.9 10*3/uL (ref 0.1–1.0)
Monocytes Relative: 11 %
Neutro Abs: 5.7 10*3/uL (ref 1.7–7.7)
Neutrophils Relative %: 68 %
Platelets: 487 10*3/uL — ABNORMAL HIGH (ref 150–400)
RBC: 4.1 MIL/uL — ABNORMAL LOW (ref 4.22–5.81)
RDW: 15.3 % (ref 11.5–15.5)
WBC: 8.2 10*3/uL (ref 4.0–10.5)

## 2016-03-22 LAB — GLUCOSE, CAPILLARY
Glucose-Capillary: 70 mg/dL (ref 65–99)
Glucose-Capillary: 95 mg/dL (ref 65–99)

## 2016-03-22 MED ORDER — LIDOCAINE-EPINEPHRINE (PF) 2 %-1:200000 IJ SOLN
INTRAMUSCULAR | Status: AC | PRN
  Administered 2016-03-22: 10 mL via INTRADERMAL

## 2016-03-22 MED ORDER — DEXTROSE 5 % IV SOLN: INTRAVENOUS | Status: DC

## 2016-03-22 MED ORDER — SODIUM CHLORIDE 0.9 % IV SOLN
INTRAVENOUS | Status: DC
  Administered 2016-03-22: 13:00:00 via INTRAVENOUS

## 2016-03-22 MED ORDER — LIDOCAINE-EPINEPHRINE (PF) 2 %-1:200000 IJ SOLN
INTRAMUSCULAR | Status: AC
  Filled 2016-03-22: qty 20

## 2016-03-22 MED ORDER — DEXTROSE-NACL 5-0.9 % IV SOLN
INTRAVENOUS | Status: DC
Start: 2016-03-22 — End: 2016-03-23
  Administered 2016-03-22: 13:00:00 via INTRAVENOUS

## 2016-03-22 MED ORDER — FENTANYL CITRATE (PF) 100 MCG/2ML IJ SOLN
INTRAMUSCULAR | Status: AC | PRN
  Administered 2016-03-22: 50 ug via INTRAVENOUS
  Administered 2016-03-22 (×2): 25 ug via INTRAVENOUS

## 2016-03-22 MED ORDER — CLINDAMYCIN PHOSPHATE 900 MG/50ML IV SOLN
900.0000 mg | INTRAVENOUS | Status: AC
  Administered 2016-03-22: 900 mg via INTRAVENOUS
  Filled 2016-03-22: qty 50

## 2016-03-22 MED ORDER — HEPARIN SOD (PORK) LOCK FLUSH 100 UNIT/ML IV SOLN
INTRAVENOUS | Status: AC
  Filled 2016-03-22: qty 5

## 2016-03-22 MED ORDER — MIDAZOLAM HCL 2 MG/2ML IJ SOLN
INTRAMUSCULAR | Status: AC | PRN
  Administered 2016-03-22 (×5): 1 mg via INTRAVENOUS

## 2016-03-22 MED ORDER — MIDAZOLAM HCL 2 MG/2ML IJ SOLN
INTRAMUSCULAR | Status: AC
  Filled 2016-03-22: qty 6

## 2016-03-22 MED ORDER — FENTANYL CITRATE (PF) 100 MCG/2ML IJ SOLN
INTRAMUSCULAR | Status: AC
  Filled 2016-03-22: qty 4

## 2016-03-22 NOTE — Telephone Encounter (Signed)
Claim form filled out for Frank Larsen. Called and informed pt it is ready to be picked up per request.

## 2016-03-22 NOTE — Discharge Instructions (Signed)
Implanted Port Home Guide °An implanted port is a type of central line that is placed under the skin. Central lines are used to provide IV access when treatment or nutrition needs to be given through a person's veins. Implanted ports are used for long-term IV access. An implanted port may be placed because:  °· You need IV medicine that would be irritating to the small veins in your hands or arms.   °· You need long-term IV medicines, such as antibiotics.   °· You need IV nutrition for a long period.   °· You need frequent blood draws for lab tests.   °· You need dialysis.   °Implanted ports are usually placed in the chest area, but they can also be placed in the upper arm, the abdomen, or the leg. An implanted port has two main parts:  °· Reservoir. The reservoir is round and will appear as a small, raised area under your skin. The reservoir is the part where a needle is inserted to give medicines or draw blood.   °· Catheter. The catheter is a thin, flexible tube that extends from the reservoir. The catheter is placed into a large vein. Medicine that is inserted into the reservoir goes into the catheter and then into the vein.   °HOW WILL I CARE FOR MY INCISION SITE? °Do not get the incision site wet. Bathe or shower as directed by your health care provider.  °HOW IS MY PORT ACCESSED? °Special steps must be taken to access the port:  °· Before the port is accessed, a numbing cream can be placed on the skin. This helps numb the skin over the port site.   °· Your health care provider uses a sterile technique to access the port. °¨ Your health care provider must put on a mask and sterile gloves. °¨ The skin over your port is cleaned carefully with an antiseptic and allowed to dry. °¨ The port is gently pinched between sterile gloves, and a needle is inserted into the port. °· Only "non-coring" port needles should be used to access the port. Once the port is accessed, a blood return should be checked. This helps  ensure that the port is in the vein and is not clogged.   °· If your port needs to remain accessed for a constant infusion, a clear (transparent) bandage will be placed over the needle site. The bandage and needle will need to be changed every week, or as directed by your health care provider.   °· Keep the bandage covering the needle clean and dry. Do not get it wet. Follow your health care provider's instructions on how to take a shower or bath while the port is accessed.   °· If your port does not need to stay accessed, no bandage is needed over the port.   °WHAT IS FLUSHING? °Flushing helps keep the port from getting clogged. Follow your health care provider's instructions on how and when to flush the port. Ports are usually flushed with saline solution or a medicine called heparin. The need for flushing will depend on how the port is used.  °· If the port is used for intermittent medicines or blood draws, the port will need to be flushed:   °¨ After medicines have been given.   °¨ After blood has been drawn.   °¨ As part of routine maintenance.   °· If a constant infusion is running, the port may not need to be flushed.   °HOW LONG WILL MY PORT STAY IMPLANTED? °The port can stay in for as long as your health care   provider thinks it is needed. When it is time for the port to come out, surgery will be done to remove it. The procedure is similar to the one performed when the port was put in.  °WHEN SHOULD I SEEK IMMEDIATE MEDICAL CARE? °When you have an implanted port, you should seek immediate medical care if:  °· You notice a bad smell coming from the incision site.   °· You have swelling, redness, or drainage at the incision site.   °· You have more swelling or pain at the port site or the surrounding area.   °· You have a fever that is not controlled with medicine. °This information is not intended to replace advice given to you by your health care provider. Make sure you discuss any questions you have with  your health care provider. °Document Released: 04/25/2005 Document Revised: 02/13/2013 Document Reviewed: 12/31/2012 °Elsevier Interactive Patient Education © 2017 [employer] °Implanted Port Insertion, Care After °Refer to this sheet in the next few weeks. These instructions provide you with information on caring for yourself after your procedure. Your health care provider may also give you more specific instructions. Your treatment has been planned according to current medical practices, but problems sometimes occur. Call your health care provider if you have any problems or questions after your procedure. °WHAT TO EXPECT AFTER THE PROCEDURE °After your procedure, it is typical to have the following:  °· Discomfort at the port insertion site. Ice packs to the area will help. °· Bruising on the skin over the port. This will subside in 3-4 days. °HOME CARE INSTRUCTIONS °· After your port is placed, you will get a manufacturer's information card. The card has information about your port. Keep this card with you at all times.   °· Know what kind of port you have. There are many types of ports available.   °· Wear a medical alert bracelet in case of an emergency. This can help alert health care workers that you have a port.   °· The port can stay in for as long as your health care provider believes it is necessary.   °· A home health care nurse may give medicines and take care of the port.   °· You or a family member can get special training and directions for giving medicine and taking care of the port at home.   °SEEK MEDICAL CARE IF:  °· Your port does not flush or you are unable to get a blood return.   °· You have a fever or chills. °SEEK IMMEDIATE MEDICAL CARE IF: °· You have new fluid or pus coming from your incision.   °· You notice a bad smell coming from your incision site.   °· You have swelling, pain, or more redness at the incision or port site.   °· You have chest pain or shortness of breath. °This  information is not intended to replace advice given to you by your health care provider. Make sure you discuss any questions you have with your health care provider. °Document Released: 02/13/2013 Document Revised: 04/30/2013 Document Reviewed: 02/13/2013 °Elsevier Interactive Patient Education © 2017 [employer] °Moderate Conscious Sedation, Adult, Care After °These instructions provide you with information about caring for yourself after your procedure. Your health care provider may also give you more specific instructions. Your treatment has been planned according to current medical practices, but problems sometimes occur. Call your health care provider if you have any problems or questions after your procedure. °What can I expect after the procedure? °After your procedure, it is common: °·   To feel sleepy for several hours. °· To feel clumsy and have poor balance for several hours. °· To have poor judgment for several hours. °· To vomit if you eat too soon. °Follow these instructions at home: °For at least 24 hours after the procedure:  °· Do not: °¨ Participate in activities where you could fall or become injured. °¨ Drive. °¨ Use heavy machinery. °¨ Drink alcohol. °¨ Take sleeping pills or medicines that cause drowsiness. °¨ Make important decisions or sign legal documents. °¨ Take care of children on your own. °· Rest. °Eating and drinking °· Follow the diet recommended by your health care provider. °· If you vomit: °¨ Drink water, juice, or soup when you can drink without vomiting. °¨ Make sure you have little or no nausea before eating solid foods. °General instructions °· Have a responsible adult stay with you until you are awake and alert. °· Take over-the-counter and prescription medicines only as told by your health care provider. °· If you smoke, do not smoke without supervision. °· Keep all follow-up visits as told by your health care provider. This is important. °Contact a health care provider  if: °· You keep feeling nauseous or you keep vomiting. °· You feel light-headed. °· You develop a rash. °· You have a fever. °Get help right away if: °· You have trouble breathing. °This information is not intended to replace advice given to you by your health care provider. Make sure you discuss any questions you have with your health care provider. °Document Released: 02/13/2013 Document Revised: 09/28/2015 Document Reviewed: 08/15/2015 °Elsevier Interactive Patient Education © 2017 [employer] ° °

## 2016-03-22 NOTE — Procedures (Signed)
Successful placement of right IJ approach port-a-cath with tip at the superior caval atrial junction. The catheter is ready for immediate use. Estimated Blood Loss: Minimal No immediate post procedural complications.  Jay Lucette Kratz, MD Pager #: [phone]   

## 2016-03-22 NOTE — H&P (Signed)
Referring Physician(s): Wyatt Portela  Supervising Physician: Sandi Mariscal  Patient Status:  [facility]  Chief Complaint:  "I'm getting a port a cath"  Subjective: Patient familiar to IR service from recent omental mass biopsy on 03/14/16. He has a known history of prostate cancer initially diagnosed in 2013 status post prostatectomy and radiation therapy; pathology from omental mass biopsy revealed adenocarcinoma, most likely of prostate origin. Recent CT also revealed borderline mediastinal lymphadenopathy with mildly enlarged anterior right juxta diaphragmatic lymph node as well as expansile lesion in the posterior right eighth rib. He presents again today for Port-A-Cath placement for chemotherapy. He currently denies fever, headache chest pain, dyspnea, cough, back pain, nausea, vomiting or abnormal bleeding. He does have wt loss and intermittent abdominal discomfort. Additional history as below. Past Medical History:  Diagnosis Date  . AK (actinic keratosis)   . Arthritis   . BPH (benign prostatic hyperplasia)   . Chronic low back pain    since 1979  . Diabetes mellitus ORAL MED   type 2  . Elevated PSA   . GERD (gastroesophageal reflux disease)   . Hearing loss    using bilateral hearing aids  . History of radiation therapy 01/11/14- 03/04/14   prostate bed 6600 cGy 33 sessions  . Hypercholesterolemia   . Hypercholesterolemia   . Hyperlipidemia   . Impaired hearing BILATERAL HEARING AIDS   20% RIGHT HEARING DUE TO VIRUS  . Melanoma ([place])   . Normal cardiac stress test 2010  . Prostate cancer ([place]) 07/20/11   Gleason 9  . Prostate cancer ([place])   . Prostate nodule   . Skin cancer    malignant melanoma of skin   . Tinnitus    stable since 1992  . Tobacco use    Past Surgical History:  Procedure Laterality Date  . APPENDECTOMY  AGE 38  . CARPOMETACARPAL JOINT ARTHROTOMY  03-22-2006   RIGHT THUMB  . CARPOMETACARPAL JOINT ARTHROTOMY  12-21-2005   LEFT THUMB  .  MELANOMA EXCISION  07-12-10   RIGHT NECK  . PROSTATE BIOPSY  07/20/2011   Procedure: BIOPSY TRANSRECTAL ULTRASONIC PROSTATE (TUBP);  Surgeon: Molli Hazard, MD;  Location: [facility];  Service: Urology;  Laterality: N/A;  1 hour requested for this case  Saturation BX  OFFICE TO BRING ULTRASOUND MACHINE    . ROBOT ASSISTED LAPAROSCOPIC RADICAL PROSTATECTOMY  10/19/2011   Procedure: ROBOTIC ASSISTED LAPAROSCOPIC RADICAL PROSTATECTOMY;  Surgeon: Molli Hazard, MD;  Location: [facility];  Service: Urology;  Laterality: N/A;      . SHOULDER OPEN ROTATOR CUFF REPAIR  07-29-2010- RIGHT   AND SUBACROMIAL DECOMPRESSION AROMIOPLASTY  . TONSILLECTOMY  AGE 23     Allergies: Scallops [shellfish allergy] and Cephalexin  Medications: Prior to Admission medications   Medication Sig Start Date End Date Taking? Authorizing Provider  aspirin 325 MG tablet Take 325 mg by mouth daily with breakfast.    Yes Historical Provider, MD  atorvastatin (LIPITOR) 10 MG tablet Take 10 mg by mouth daily with breakfast.    Yes Historical Provider, MD  glipiZIDE (GLUCOTROL XL) 5 MG 24 hr tablet Take 1 tablet (5 mg total) by mouth daily with breakfast. 12/29/15  Yes Philemon Kingdom, MD  HYDROcodone-acetaminophen (NORCO/VICODIN) 5-325 MG tablet Take 1-2 tablets by mouth every 6 (six) hours as needed for moderate pain. 03/05/16  Yes Daryl F de Villier II, PA  metFORMIN (GLUCOPHAGE) 1000 MG tablet Take 1 tablet (1,000 mg total) by mouth 2 (  two) times daily with a meal. 01/25/16  Yes Philemon Kingdom, MD  Multiple Vitamin (MULTIVITAMIN) tablet Take 1 tablet by mouth daily.   Yes Historical Provider, MD  omeprazole (PRILOSEC) 20 MG capsule Take 20 mg by mouth every morning.   Yes Historical Provider, MD  pioglitazone (ACTOS) 30 MG tablet Take 1 tablet (30 mg total) by mouth daily. 01/25/16  Yes Philemon Kingdom, MD  pseudoephedrine-acetaminophen (TYLENOL SINUS) 30-500 MG TABS tablet Take 1 tablet by  mouth every 4 (four) hours as needed (congestion).    Yes Historical Provider, MD  sitaGLIPtin (JANUVIA) 100 MG tablet Take 100 mg by mouth daily.   Yes Historical Provider, MD  Blood Glucose Monitoring Suppl (ONE TOUCH ULTRA 2) w/Device KIT Use to check blood sugar 2 times per day. 10/30/15   Elayne Snare, MD  glucose blood (ONE TOUCH ULTRA TEST) test strip Use to check blood sugar 2 times per day. 01/25/16   Philemon Kingdom, MD  lidocaine-prilocaine (EMLA) cream Apply 1 application topically as needed. Apply to port before chemotherapy. 03/17/16   Wyatt Portela, MD  [facility] DELICA LANCETS 59F MISC Use to check blood sugar 2 times per day. 01/27/16   Philemon Kingdom, MD  prochlorperazine (COMPAZINE) 10 MG tablet Take 1 tablet (10 mg total) by mouth every 6 (six) hours as needed for nausea or vomiting. 03/17/16   Wyatt Portela, MD     Vital Signs: BP (!) 150/79   Pulse 74   Temp 97.9 F (36.6 C) (Oral)   Resp 18   SpO2 100%   Physical Exam awake, alert. Chest clear to auscultation bilaterally. Heart with regular rate and rhythm. Abdomen soft, positive bowel sounds, mild peri umbilical tenderness to palpation; lower extremities with no edema  Imaging: Ct Chest W Contrast  Result Date: 03/21/2016 CLINICAL DATA:  Prostate cancer. EXAM: CT CHEST WITH CONTRAST TECHNIQUE: Multidetector CT imaging of the chest was performed during intravenous contrast administration. CONTRAST:  50m ISOVUE-300 IOPAMIDOL (ISOVUE-300) INJECTION 61% COMPARISON:  None. FINDINGS: Cardiovascular: The heart size is normal. No pericardial effusion. Coronary artery calcification is noted. Atherosclerotic calcification is noted in the wall of the thoracic aorta. Mediastinum/Nodes: 9 mm short axis AP window lymph node is upper limits of normal for size. 8 mm short axis high right paratracheal lymph node is seen on image 28 series 2. 6 mm short axis lymph node identified right thoracic inlet. There is no hilar lymphadenopathy.  The esophagus has normal imaging features. 14 mm right thyroid nodule evident. There is no axillary lymphadenopathy. Lungs/Pleura: Subsegmental atelectasis noted right lower lobe. Subsegmental atelectasis also noted left lower lobe. No focal airspace consolidation. No suspicious pulmonary nodule or mass. Upper Abdomen: Small volume fluid seen around the liver and spleen. Abnormal soft tissue identified in the omentum, as seen previously. 11 mm indeterminate low-density lesion identified lateral segment left liver. 14 mm short axis anterior right juxta diaphragmatic lymph node is identified on image 116 series 2. Musculoskeletal: Expansile lesion identified posterior right eighth rib. IMPRESSION: 1. Borderline mediastinal lymphadenopathy with mildly enlarged anterior right juxta diaphragmatic lymph node. Metastatic disease is a consideration. 2. Expansile lesion posterior right eighth rib, suggesting metastatic disease. 3. Coronary artery and thoracic aortic atherosclerosis. Electronically Signed   By: EMisty StanleyM.D.   On: 03/21/2016 08:47    Labs:  CBC:  Recent Labs  03/05/16 1459 03/14/16 0735 03/22/16 1249  WBC 6.1 7.8 8.2  HGB 12.0* 12.3* 11.0*  HCT 38.4* 38.5* 35.2*  PLT 352  501* 487*    COAGS:  Recent Labs  03/14/16 0735  INR 1.11    BMP:  Recent Labs  03/05/16 1459  NA 136  K 4.1  CL 102  CO2 22  GLUCOSE 205*  BUN 28*  CALCIUM 9.2  CREATININE 0.95  GFRNONAA >60  GFRAA >60    LIVER FUNCTION TESTS:  Recent Labs  03/05/16 1459  BILITOT 0.8  AST 21  ALT 17  ALKPHOS 42  PROT 7.6  ALBUMIN 4.1    Assessment and Plan: Pt with known history of prostate cancer initially diagnosed in 2013 status post prostatectomy and radiation therapy; pathology from omental mass biopsy revealed adenocarcinoma, most likely of prostate origin. Recent CT also revealed borderline mediastinal lymphadenopathy with mildly enlarged anterior right juxta diaphragmatic lymph node as  well as expansile lesion in the posterior right eighth rib. He presents again today for Port-A-Cath placement for chemotherapy.Risks and benefits discussed with the patient/wife including, but not limited to bleeding, infection, pneumothorax, or fibrin sheath development and need for additional procedures. All of the patient's questions were answered, patient is agreeable to proceed. Consent signed and in chart.     Electronically Signed: D. Rowe Robert 03/22/2016, 1:12 PM   I spent a total of 20 minutes at the the patient's bedside AND on the patient's hospital floor or unit, greater than 50% of which was counseling/coordinating care for port a cath placement

## 2016-03-22 NOTE — Telephone Encounter (Signed)
error 

## 2016-03-29 ENCOUNTER — Other Ambulatory Visit: Payer: PPO

## 2016-03-30 ENCOUNTER — Ambulatory Visit (HOSPITAL_BASED_OUTPATIENT_CLINIC_OR_DEPARTMENT_OTHER): Payer: PPO

## 2016-03-30 ENCOUNTER — Other Ambulatory Visit (HOSPITAL_BASED_OUTPATIENT_CLINIC_OR_DEPARTMENT_OTHER): Payer: PPO

## 2016-03-30 ENCOUNTER — Ambulatory Visit (INDEPENDENT_AMBULATORY_CARE_PROVIDER_SITE_OTHER): Payer: PPO | Admitting: Internal Medicine

## 2016-03-30 ENCOUNTER — Encounter: Payer: Self-pay | Admitting: Internal Medicine

## 2016-03-30 ENCOUNTER — Ambulatory Visit: Payer: PPO

## 2016-03-30 VITALS — BP 154/77 | HR 81 | Temp 97.9°F | Resp 18

## 2016-03-30 VITALS — BP 150/80 | HR 77 | Ht 70.5 in | Wt 185.6 lb

## 2016-03-30 DIAGNOSIS — C61 Malignant neoplasm of prostate: Secondary | ICD-10-CM | POA: Diagnosis not present

## 2016-03-30 DIAGNOSIS — E1165 Type 2 diabetes mellitus with hyperglycemia: Secondary | ICD-10-CM

## 2016-03-30 DIAGNOSIS — Z5111 Encounter for antineoplastic chemotherapy: Secondary | ICD-10-CM | POA: Diagnosis not present

## 2016-03-30 DIAGNOSIS — Z95828 Presence of other vascular implants and grafts: Secondary | ICD-10-CM | POA: Insufficient documentation

## 2016-03-30 DIAGNOSIS — E1142 Type 2 diabetes mellitus with diabetic polyneuropathy: Secondary | ICD-10-CM

## 2016-03-30 LAB — COMPREHENSIVE METABOLIC PANEL
ALBUMIN: 3.3 g/dL — AB (ref 3.5–5.0)
ALK PHOS: 47 U/L (ref 40–150)
ALT: 23 U/L (ref 0–55)
AST: 25 U/L (ref 5–34)
Anion Gap: 10 mEq/L (ref 3–11)
BUN: 16.8 mg/dL (ref 7.0–26.0)
CALCIUM: 9.6 mg/dL (ref 8.4–10.4)
CO2: 23 mEq/L (ref 22–29)
CREATININE: 0.8 mg/dL (ref 0.7–1.3)
Chloride: 108 mEq/L (ref 98–109)
EGFR: >90 mL/min/{1.73_m2} (ref >90)
Glucose: 114 mg/dl (ref 70–140)
Potassium: 4.3 mEq/L (ref 3.5–5.1)
Sodium: 142 mEq/L (ref 136–145)
Total Bilirubin: 0.32 mg/dL (ref 0.20–1.20)
Total Protein: 6.8 g/dL (ref 6.4–8.3)

## 2016-03-30 LAB — CBC WITH DIFFERENTIAL/PLATELET
BASO%: 1.1 % (ref 0.0–2.0)
BASOS ABS: 0.1 10*3/uL (ref 0.0–0.1)
EOS%: 1.4 % (ref 0.0–7.0)
Eosinophils Absolute: 0.1 10*3/uL (ref 0.0–0.5)
HEMATOCRIT: 32.4 % — AB (ref 38.4–49.9)
HEMOGLOBIN: 10.2 g/dL — AB (ref 13.0–17.1)
LYMPH#: 0.9 10*3/uL (ref 0.9–3.3)
LYMPH%: 15.7 % (ref 14.0–49.0)
MCH: 26.2 pg — AB (ref 27.2–33.4)
MCHC: 31.3 g/dL — ABNORMAL LOW (ref 32.0–36.0)
MCV: 83.7 fL (ref 79.3–98.0)
MONO#: 0.7 10*3/uL (ref 0.1–0.9)
MONO%: 12.5 % (ref 0.0–14.0)
NEUT#: 4 10*3/uL (ref 1.5–6.5)
NEUT%: 69.3 % (ref 39.0–75.0)
PLATELETS: 340 10*3/uL (ref 140–400)
RBC: 3.88 10*6/uL — ABNORMAL LOW (ref 4.20–5.82)
RDW: 16.1 % — AB (ref 11.0–14.6)
WBC: 5.8 10*3/uL (ref 4.0–10.3)

## 2016-03-30 LAB — POCT GLYCOSYLATED HEMOGLOBIN (HGB A1C): Hemoglobin A1C: 6.5

## 2016-03-30 MED ORDER — DEXAMETHASONE SODIUM PHOSPHATE 10 MG/ML IJ SOLN
INTRAMUSCULAR | Status: AC
  Filled 2016-03-30: qty 1

## 2016-03-30 MED ORDER — DEXAMETHASONE SODIUM PHOSPHATE 10 MG/ML IJ SOLN
10.0000 mg | Freq: Once | INTRAMUSCULAR | Status: AC
  Administered 2016-03-30: 10 mg via INTRAVENOUS

## 2016-03-30 MED ORDER — DOCETAXEL CHEMO INJECTION 160 MG/16ML
75.0000 mg/m2 | Freq: Once | INTRAVENOUS | Status: AC
  Administered 2016-03-30: 150 mg via INTRAVENOUS
  Filled 2016-03-30: qty 15

## 2016-03-30 MED ORDER — SODIUM CHLORIDE 0.9 % IV SOLN: 10.0000 mg | Freq: Once | INTRAVENOUS | Status: DC

## 2016-03-30 MED ORDER — SODIUM CHLORIDE 0.9 % IV SOLN
Freq: Once | INTRAVENOUS | Status: AC
  Administered 2016-03-30: 12:00:00 via INTRAVENOUS

## 2016-03-30 MED ORDER — SODIUM CHLORIDE 0.9% FLUSH
10.0000 mL | INTRAVENOUS | Status: DC | PRN
  Administered 2016-03-30: 10 mL via INTRAVENOUS
  Filled 2016-03-30: qty 10

## 2016-03-30 MED ORDER — SODIUM CHLORIDE 0.9% FLUSH
10.0000 mL | INTRAVENOUS | Status: DC | PRN
  Administered 2016-03-30: 10 mL
  Filled 2016-03-30: qty 10

## 2016-03-30 MED ORDER — HEPARIN SOD (PORK) LOCK FLUSH 100 UNIT/ML IV SOLN
500.0000 [IU] | Freq: Once | INTRAVENOUS | Status: AC | PRN
  Administered 2016-03-30: 500 [IU]
  Filled 2016-03-30: qty 5

## 2016-03-30 NOTE — Patient Instructions (Addendum)
Please continue: - Metformin 1000 mg 2x a day, with meals - Actos 30 mg daily in a.m. - Glipizide ER 5 mg daily in am, before b'fast  Stay off [place] for now.  After the first of the year, restart Januvia if sugars: - before meals are >130 - after meals are >180  Please let me know if the sugars are consistently <80 or >200.  Please try to hold Dexamethasone with your chemotherapy.  Please return in 3 months with your sugar log.

## 2016-03-30 NOTE — Progress Notes (Signed)
Patient ID: Frank Larsen, male   DOB: 1946/01/05, 70 y.o.   MRN: [id number]  HPI: Frank Larsen is a 70 y.o.-year-old male, returning for f/u for DM2, dx in 2013, non-insulin-dependent, uncontrolled, with complications (PN). He saw Dr. Sharl Ma in the past. Last visit with me 2 mo ago.  Since last visit, he was dx'ed with omental Pr CA metastases (previously dx'ed in 2013). He starts ChTx today. Prev. RxTx in 2015. Oncologist: Dr. Clelia Croft.  He has had diarrhea, nausea >> now better.  Last hemoglobin A1c was: Lab Results  Component Value Date   HGBA1C 6.8 12/29/2015   HGBA1C 8.2 08/24/2015  02/17/2015: 7.7% 10/2014: 7.6%  Pt is on a regimen of: - Metformin 1000 mg 2x a day, with meals Ran out of Januvia 100 mg daily in a.m. >> not covered until the end of the year (doughnut hole) - Actos 30 mg daily in a.m. - added in 2016 - Glipizide ER 5 mg daily in am, before b'fast - added 10/2015  Pt started to check sugars at home: 2x a day.  - am: n/c >> (after coffee) 142-172, 192 >> 106-164, 198, some 200s 2/2 pain - 2h after b'fast: n/c >> 119-155 >> n/c - before lunch: n/c >> 127-152 >> 114-151, 220 - 2h after lunch: n/c >> 127-167 >> 127, 144 - before dinner: n/c >> 96-142 >> 114, 205 (pain) - 2h after dinner: n/c >> 98, 121-181 >> 85, 99 - bedtime: n/c - nighttime: n/c No lows. Lowest sugar was 119 >> 96 >> 70 (took Glipizide and did not eat); he has hypoglycemia awareness at 70.  Highest sugar was 204 >> 224 >> 200s  Glucometer: AccuChek - old >> given an AccuChek Guide (not covered) >> One Touch Ultra  Pt's meals are: - Breakfast: eggs, toast + PB, pancakes + waffles - Lunch: sandwich - Dinner: chicken + veggies + starch or salad - Snacks: popcorn, pretzels, cookies, chocolate  - no CKD, last BUN/creatinine:  08/24/2015: 14/1.01, EGFR 73 Lab Results  Component Value Date   BUN 28 (H) 03/05/2016   BUN 10 10/20/2011   CREATININE 0.95 03/05/2016   CREATININE 0.93 10/20/2011   He had increased microalbumin in the past and started losartan on 02/17/2015. - last set of lipids: 08/24/2015: 141/134/41/74  He is on atorvastatin. - last eye exam was 06/02/2015. No DR.  - no numbness, but has tingling in his feet. + foot exam in 08/2015 >> normal sensation to manofilament  He also has a h/o PrCA (had surgery and radiation) and Melanoma. He also has HTN, HL.  ROS: Constitutional: + weight loss, no  fatigue, no subjective hyperthermia/hypothermia, + nocturia Eyes: no blurry vision, no xerophthalmia ENT: no sore throat, no nodules palpated in throat, no dysphagia/odynophagia, no hoarseness, + tinnitus, + hypoacusis Cardiovascular: no CP/SOB/palpitations/+ leg swelling Respiratory: no cough/SOB Gastrointestinal: + N/+ V/+ D/+ C Musculoskeletal: no muscle/joint aches Skin: no rashes Neurological: no tremors/numbness/tingling/dizziness  I reviewed pt's medications, allergies, PMH, social hx, family hx, and changes were documented in the history of present illness. Otherwise, unchanged from my initial visit note.  Past Medical History:  Diagnosis Date  . AK (actinic keratosis)   . Arthritis   . BPH (benign prostatic hyperplasia)   . Chronic low back pain    since 1979  . Diabetes mellitus ORAL MED   type 2  . Elevated PSA   . GERD (gastroesophageal reflux disease)   . Hearing loss    using bilateral  hearing aids  . History of radiation therapy 01/11/14- 03/04/14   prostate bed 6600 cGy 33 sessions  . Hypercholesterolemia   . Hypercholesterolemia   . Hyperlipidemia   . Impaired hearing BILATERAL HEARING AIDS   20% RIGHT HEARING DUE TO VIRUS  . Melanoma ([place])   . Normal cardiac stress test 2010  . Prostate cancer ([place]) 07/20/11   Gleason 9  . Prostate cancer ([place])   . Prostate nodule   . Skin cancer    malignant melanoma of skin   . Tinnitus    stable since 1992  . Tobacco use    Past Surgical History:  Procedure Laterality Date  . APPENDECTOMY  AGE  4  . CARPOMETACARPAL JOINT ARTHROTOMY  03-22-2006   RIGHT THUMB  . CARPOMETACARPAL JOINT ARTHROTOMY  12-21-2005   LEFT THUMB  . IR GENERIC HISTORICAL  03/22/2016   IR US GUIDE VASC ACCESS RIGHT [facility]  . IR GENERIC HISTORICAL  03/22/2016   IR FLUORO GUIDE PORT INSERTION RIGHT [facility]  . MELANOMA EXCISION  07-12-10   RIGHT NECK  . PROSTATE BIOPSY  07/20/2011   Procedure: BIOPSY TRANSRECTAL ULTRASONIC PROSTATE (TUBP);  Surgeon: Molli Hazard, MD;  Location: [facility];  Service: Urology;  Laterality: N/A;  1 hour requested for this case  Saturation BX  OFFICE TO BRING ULTRASOUND MACHINE    . ROBOT ASSISTED LAPAROSCOPIC RADICAL PROSTATECTOMY  10/19/2011   Procedure: ROBOTIC ASSISTED LAPAROSCOPIC RADICAL PROSTATECTOMY;  Surgeon: Molli Hazard, MD;  Location: [facility];  Service: Urology;  Laterality: N/A;      . SHOULDER OPEN ROTATOR CUFF REPAIR  07-29-2010- RIGHT   AND SUBACROMIAL DECOMPRESSION AROMIOPLASTY  . TONSILLECTOMY  AGE 46   Social History   Social History  . Marital Status: Married    Spouse Name: N/A  . Number of Children: 2   Occupational History  . Retired     Public relations account executive   Social History Main Topics  . Smoking status: Former Smoker -- 1.00 packs/day for 43 years    Types: Cigarettes    Quit date: 12/07/2007  . Smokeless tobacco: Never Used  . Alcohol Use: Yes     Comment: OCCASIONAL  . Drug Use: No   Current Outpatient Prescriptions on File Prior to Visit  Medication Sig Dispense Refill  . aspirin 325 MG tablet Take 325 mg by mouth daily with breakfast.     . atorvastatin (LIPITOR) 10 MG tablet Take 10 mg by mouth daily with breakfast.     . Blood Glucose Monitoring Suppl (ONE TOUCH ULTRA 2) w/Device KIT Use to check blood sugar 2 times per day. 1 each 2  . glipiZIDE (GLUCOTROL XL) 5 MG 24 hr tablet Take 1 tablet (5 mg total) by mouth daily with breakfast. 90 tablet 0  . glucose blood (ONE TOUCH ULTRA TEST)  test strip Use to check blood sugar 2 times per day. 200 each 5  . HYDROcodone-acetaminophen (NORCO/VICODIN) 5-325 MG tablet Take 1-2 tablets by mouth every 6 (six) hours as needed for moderate pain. 20 tablet 0  . lidocaine-prilocaine (EMLA) cream Apply 1 application topically as needed. Apply to port before chemotherapy. 30 g 0  . metFORMIN (GLUCOPHAGE) 1000 MG tablet Take 1 tablet (1,000 mg total) by mouth 2 (two) times daily with a meal. 90 tablet 0  . Multiple Vitamin (MULTIVITAMIN) tablet Take 1 tablet by mouth daily.    [name] omeprazole (PRILOSEC) 20 MG capsule Take 20 mg by mouth every  morning.    [name] DELICA LANCETS 67M MISC Use to check blood sugar 2 times per day. 200 each 2  . pioglitazone (ACTOS) 30 MG tablet Take 1 tablet (30 mg total) by mouth daily. 90 tablet 0  . prochlorperazine (COMPAZINE) 10 MG tablet Take 1 tablet (10 mg total) by mouth every 6 (six) hours as needed for nausea or vomiting. 30 tablet 0  . pseudoephedrine-acetaminophen (TYLENOL SINUS) 30-500 MG TABS tablet Take 1 tablet by mouth every 4 (four) hours as needed (congestion).     . sitaGLIPtin (JANUVIA) 100 MG tablet Take 100 mg by mouth daily.     No current facility-administered medications on file prior to visit.    Allergies  Allergen Reactions  . Scallops [Shellfish Allergy] Nausea And Vomiting  . Cephalexin Hives and Rash   Family History  Problem Relation Age of Onset  . Cancer Mother     breast/lived 6 years post  . Alzheimer's disease Mother     deceased  . Cancer Brother     thyroid  . Heart disease Brother   . Stroke Father     77 years old  . Hypertension Father   . Alcoholism Father   . CVA Father   . Cirrhosis Paternal Grandfather     alcohol-related  . Cancer Paternal Grandmother     unknown type    PE: BP (!) 150/80   Pulse 77   Ht 5' 10.5" (1.791 m)   Wt 185 lb 9.6 oz (84.2 kg)   SpO2 96%   BMI 26.25 kg/m  Wt Readings from Last 3 Encounters:  03/30/16 185 lb 9.6 oz  (84.2 kg)  03/17/16 178 lb 12.8 oz (81.1 kg)  03/14/16 184 lb (83.5 kg)   Constitutional: normal weight, in NAD Eyes: PERRLA, EOMI, no exophthalmos ENT: moist mucous membranes, no thyromegaly, no cervical lymphadenopathy Cardiovascular: RRR, No MRG, + very mild periankle edema Respiratory: CTA B Gastrointestinal: abdomen soft, NT, ND, BS+ Musculoskeletal: no deformities, strength intact in all 4 Skin: moist, warm, no rashes Neurological: no tremor with outstretched hands, DTR normal in all 4  ASSESSMENT: 1. DM2, non-insulin-dependent, uncontrolled, with complications - PN  PLAN:  1. Patient with long-standing, uncontrolled diabetes, on oral antidiabetic regimen. He was recently dx'ed with metastatic PrCA >> lost 10 lbs >> sugars are  Better. We can stay off [place] for now.  - advised to try to have ChTx w/o Dexamethasone if possible. - I suggested to:  Patient Instructions  Please continue: - Metformin 1000 mg 2x a day, with meals - Actos 30 mg daily in a.m. - Glipizide ER 5 mg daily in am, before b'fast  Stay off [place] for now.  After the first of the year, restart Januvia if sugars: - before meals are >130 - after meals are >180  Please let me know if the sugars are consistently <80 or >200.  Please try to hold Dexamethasone with your chemotherapy.  Please return in 3 months with your sugar log.   - continue checking sugars at different times of the day - check once a day, rotating checks - advised for yearly eye exams >> he is UTD - checked HbA1c today: 6.5% (better) - Return to clinic in 3 mo with sugar log   Philemon Kingdom, MD PhD [facility]  CC:  - Dr. Tresa Moore - Dr. Valere Dross

## 2016-03-30 NOTE — Patient Instructions (Addendum)
[facility] Discharge Instructions for Patients Receiving Chemotherapy  Today you received the following chemotherapy agents:  Taxotere  To help prevent nausea and vomiting after your treatment, we encourage you to take your nausea medication.  Take it as often as prescribed.     If you develop nausea and vomiting that is not controlled by your nausea medication, call the clinic. If it is after clinic hours your family physician or the after hours number for the clinic or go to the Emergency Department.   BELOW ARE SYMPTOMS THAT SHOULD BE REPORTED IMMEDIATELY:  *FEVER GREATER THAN 100.5 F  *CHILLS WITH OR WITHOUT FEVER  NAUSEA AND VOMITING THAT IS NOT CONTROLLED WITH YOUR NAUSEA MEDICATION  *UNUSUAL SHORTNESS OF BREATH  *UNUSUAL BRUISING OR BLEEDING  TENDERNESS IN MOUTH AND THROAT WITH OR WITHOUT PRESENCE OF ULCERS  *URINARY PROBLEMS  *BOWEL PROBLEMS  UNUSUAL RASH Items with * indicate a potential emergency and should be followed up as soon as possible.  Feel free to call the clinic you have any questions or concerns. The clinic phone number is [phone].   I have been informed and understand all the instructions given to me. I know to contact the clinic, my physician, or go to the Emergency Department if any problems should occur. I do not have any questions at this time, but understand that I may call the clinic during office hours   should I have any questions or need assistance in obtaining follow up care.    __________________________________________  _____________  __________ Signature of Patient or Authorized Representative            Date                   Time    __________________________________________ Nurse's Signature       Docetaxel injection (Taxotere) What is this medicine? DOCETAXEL (doe se TAX el) is a chemotherapy drug. It targets fast dividing cells, like cancer cells, and causes these cells to die. This medicine is used to  treat many types of cancers like breast cancer, certain stomach cancers, head and neck cancer, lung cancer, and prostate cancer. This medicine may be used for other purposes; ask your health care provider or pharmacist if you have questions. COMMON BRAND NAME(S): Docefrez, Taxotere What should I tell my health care provider before I take this medicine? They need to know if you have any of these conditions: -infection (especially a virus infection such as chickenpox, cold sores, or herpes) -liver disease -low blood counts, like low white cell, platelet, or red cell counts -an unusual or allergic reaction to docetaxel, polysorbate 80, other chemotherapy agents, other medicines, foods, dyes, or preservatives -pregnant or trying to get pregnant -breast-feeding How should I use this medicine? This drug is given as an infusion into a vein. It is administered in a hospital or clinic by a specially trained health care professional. Talk to your pediatrician regarding the use of this medicine in children. Special care may be needed. Overdosage: If you think you have taken too much of this medicine contact a poison control center or emergency room at once. NOTE: This medicine is only for you. Do not share this medicine with others. What if I miss a dose? It is important not to miss your dose. Call your doctor or health care professional if you are unable to keep an appointment. What may interact with this medicine? -cyclosporine -erythromycin -ketoconazole -medicines to increase blood counts like filgrastim, pegfilgrastim,  sargramostim -vaccines Talk to your doctor or health care professional before taking any of these medicines: -acetaminophen -aspirin -ibuprofen -ketoprofen -naproxen This list may not describe all possible interactions. Give your health care provider a list of all the medicines, herbs, non-prescription drugs, or dietary supplements you use. Also tell them if you smoke, drink  alcohol, or use illegal drugs. Some items may interact with your medicine. What should I watch for while using this medicine? Your condition will be monitored carefully while you are receiving this medicine. You will need important blood work done while you are taking this medicine. This drug may make you feel generally unwell. This is not uncommon, as chemotherapy can affect healthy cells as well as cancer cells. Report any side effects. Continue your course of treatment even though you feel ill unless your doctor tells you to stop. In some cases, you may be given additional medicines to help with side effects. Follow all directions for their use. Call your doctor or health care professional for advice if you get a fever, chills or sore throat, or other symptoms of a cold or flu. Do not treat yourself. This drug decreases your body's ability to fight infections. Try to avoid being around people who are sick. This medicine may increase your risk to bruise or bleed. Call your doctor or health care professional if you notice any unusual bleeding. This medicine may contain alcohol in the product. You may get drowsy or dizzy. Do not drive, use machinery, or do anything that needs mental alertness until you know how this medicine affects you. Do not stand or sit up quickly, especially if you are an older patient. This reduces the risk of dizzy or fainting spells. Avoid alcoholic drinks. Do not become pregnant while taking this medicine. Women should inform their doctor if they wish to become pregnant or think they might be pregnant. There is a potential for serious side effects to an unborn child. Talk to your health care professional or pharmacist for more information. Do not breast-feed an infant while taking this medicine. What side effects may I notice from receiving this medicine? Side effects that you should report to your doctor or health care professional as soon as possible: -allergic reactions like  skin rash, itching or hives, swelling of the face, lips, or tongue -low blood counts - This drug may decrease the number of white blood cells, red blood cells and platelets. You may be at increased risk for infections and bleeding. -signs of infection - fever or chills, cough, sore throat, pain or difficulty passing urine -signs of decreased platelets or bleeding - bruising, pinpoint red spots on the skin, black, tarry stools, nosebleeds -signs of decreased red blood cells - unusually weak or tired, fainting spells, lightheadedness -breathing problems -fast or irregular heartbeat -low blood pressure -mouth sores -nausea and vomiting -pain, swelling, redness or irritation at the injection site -pain, tingling, numbness in the hands or feet -swelling of the ankle, feet, hands -weight gain Side effects that usually do not require medical attention (report to your doctor or health care professional if they continue or are bothersome): -bone pain -complete hair loss including hair on your head, underarms, pubic hair, eyebrows, and eyelashes -diarrhea -excessive tearing -changes in the color of fingernails -loosening of the fingernails -nausea -muscle pain -red flush to skin -sweating -weak or tired This list may not describe all possible side effects. Call your doctor for medical advice about side effects. You may report side effects to FDA  at 1-800-FDA-1088. Where should I keep my medicine? This drug is given in a hospital or clinic and will not be stored at home. NOTE: This sheet is a summary. It may not cover all possible information. If you have questions about this medicine, talk to your doctor, pharmacist, or health care provider.  2017 Elsevier/Gold Standard (2015-05-28 12:32:56)   Pegfilgrastim injection (Neulasta) What is this medicine? PEGFILGRASTIM (PEG fil gra stim) is a long-acting granulocyte colony-stimulating factor that stimulates the growth of neutrophils, a type of  white blood cell important in the body's fight against infection. It is used to reduce the incidence of fever and infection in patients with certain types of cancer who are receiving chemotherapy that affects the bone marrow, and to increase survival after being exposed to high doses of radiation. This medicine may be used for other purposes; ask your health care provider or pharmacist if you have questions. COMMON BRAND NAME(S): Neulasta What should I tell my health care provider before I take this medicine? They need to know if you have any of these conditions: -kidney disease -latex allergy -ongoing radiation therapy -sickle cell disease -skin reactions to acrylic adhesives (On-Body Injector only) -an unusual or allergic reaction to pegfilgrastim, filgrastim, other medicines, foods, dyes, or preservatives -pregnant or trying to get pregnant -breast-feeding How should I use this medicine? This medicine is for injection under the skin. If you get this medicine at home, you will be taught how to prepare and give the pre-filled syringe or how to use the On-body Injector. Refer to the patient Instructions for Use for detailed instructions. Use exactly as directed. Take your medicine at regular intervals. Do not take your medicine more often than directed. It is important that you put your used needles and syringes in a special sharps container. Do not put them in a trash can. If you do not have a sharps container, call your pharmacist or healthcare provider to get one. Talk to your pediatrician regarding the use of this medicine in children. While this drug may be prescribed for selected conditions, precautions do apply. Overdosage: If you think you have taken too much of this medicine contact a poison control center or emergency room at once. NOTE: This medicine is only for you. Do not share this medicine with others. What if I miss a dose? It is important not to miss your dose. Call your doctor  or health care professional if you miss your dose. If you miss a dose due to an On-body Injector failure or leakage, a new dose should be administered as soon as possible using a single prefilled syringe for manual use. What may interact with this medicine? Interactions have not been studied. Give your health care provider a list of all the medicines, herbs, non-prescription drugs, or dietary supplements you use. Also tell them if you smoke, drink alcohol, or use illegal drugs. Some items may interact with your medicine. This list may not describe all possible interactions. Give your health care provider a list of all the medicines, herbs, non-prescription drugs, or dietary supplements you use. Also tell them if you smoke, drink alcohol, or use illegal drugs. Some items may interact with your medicine. What should I watch for while using this medicine? You may need blood work done while you are taking this medicine. If you are going to need a MRI, CT scan, or other procedure, tell your doctor that you are using this medicine (On-Body Injector only). What side effects may I notice from  receiving this medicine? Side effects that you should report to your doctor or health care professional as soon as possible: -allergic reactions like skin rash, itching or hives, swelling of the face, lips, or tongue -dizziness -fever -pain, redness, or irritation at site where injected -pinpoint red spots on the skin -red or dark-brown urine -shortness of breath or breathing problems -stomach or side pain, or pain at the shoulder -swelling -tiredness -trouble passing urine or change in the amount of urine Side effects that usually do not require medical attention (report to your doctor or health care professional if they continue or are bothersome): -bone pain -muscle pain This list may not describe all possible side effects. Call your doctor for medical advice about side effects. You may report side effects to  FDA at 1-800-FDA-1088. Where should I keep my medicine? Keep out of the reach of children. Store pre-filled syringes in a refrigerator between 2 and 8 degrees C (36 and 46 degrees F). Do not freeze. Keep in carton to protect from light. Throw away this medicine if it is left out of the refrigerator for more than 48 hours. Throw away any unused medicine after the expiration date. NOTE: This sheet is a summary. It may not cover all possible information. If you have questions about this medicine, talk to your doctor, pharmacist, or health care provider.  2017 Elsevier/Gold Standard (2014-05-15 14:30:14)

## 2016-03-31 LAB — PSA: Prostate Specific Ag, Serum: 1.3 ng/mL (ref 0.0–4.0)

## 2016-04-01 ENCOUNTER — Ambulatory Visit (HOSPITAL_BASED_OUTPATIENT_CLINIC_OR_DEPARTMENT_OTHER): Payer: PPO

## 2016-04-01 VITALS — BP 141/73 | HR 84 | Temp 97.8°F | Resp 18

## 2016-04-01 DIAGNOSIS — C61 Malignant neoplasm of prostate: Secondary | ICD-10-CM | POA: Diagnosis not present

## 2016-04-01 DIAGNOSIS — Z5189 Encounter for other specified aftercare: Secondary | ICD-10-CM

## 2016-04-01 MED ORDER — PEGFILGRASTIM INJECTION 6 MG/0.6ML ~~LOC~~
6.0000 mg | PREFILLED_SYRINGE | Freq: Once | SUBCUTANEOUS | Status: AC
Start: 2016-04-01 — End: 2016-04-01
  Administered 2016-04-01: 6 mg via SUBCUTANEOUS
  Filled 2016-04-01: qty 0.6

## 2016-04-01 NOTE — Patient Instructions (Signed)
Pegfilgrastim injection What is this medicine? PEGFILGRASTIM (PEG fil gra stim) is a long-acting granulocyte colony-stimulating factor that stimulates the growth of neutrophils, a type of white blood cell important in the body's fight against infection. It is used to reduce the incidence of fever and infection in patients with certain types of cancer who are receiving chemotherapy that affects the bone marrow, and to increase survival after being exposed to high doses of radiation. This medicine may be used for other purposes; ask your health care provider or pharmacist if you have questions. COMMON BRAND NAME(S): Neulasta What should I tell my health care provider before I take this medicine? They need to know if you have any of these conditions: -kidney disease -latex allergy -ongoing radiation therapy -sickle cell disease -skin reactions to acrylic adhesives (On-Body Injector only) -an unusual or allergic reaction to pegfilgrastim, filgrastim, other medicines, foods, dyes, or preservatives -pregnant or trying to get pregnant -breast-feeding How should I use this medicine? This medicine is for injection under the skin. If you get this medicine at home, you will be taught how to prepare and give the pre-filled syringe or how to use the On-body Injector. Refer to the patient Instructions for Use for detailed instructions. Use exactly as directed. Take your medicine at regular intervals. Do not take your medicine more often than directed. It is important that you put your used needles and syringes in a special sharps container. Do not put them in a trash can. If you do not have a sharps container, call your pharmacist or healthcare provider to get one. Talk to your pediatrician regarding the use of this medicine in children. While this drug may be prescribed for selected conditions, precautions do apply. Overdosage: If you think you have taken too much of this medicine contact a poison control  center or emergency room at once. NOTE: This medicine is only for you. Do not share this medicine with others. What if I miss a dose? It is important not to miss your dose. Call your doctor or health care professional if you miss your dose. If you miss a dose due to an On-body Injector failure or leakage, a new dose should be administered as soon as possible using a single prefilled syringe for manual use. What may interact with this medicine? Interactions have not been studied. Give your health care provider a list of all the medicines, herbs, non-prescription drugs, or dietary supplements you use. Also tell them if you smoke, drink alcohol, or use illegal drugs. Some items may interact with your medicine. This list may not describe all possible interactions. Give your health care provider a list of all the medicines, herbs, non-prescription drugs, or dietary supplements you use. Also tell them if you smoke, drink alcohol, or use illegal drugs. Some items may interact with your medicine. What should I watch for while using this medicine? You may need blood work done while you are taking this medicine. If you are going to need a MRI, CT scan, or other procedure, tell your doctor that you are using this medicine (On-Body Injector only). What side effects may I notice from receiving this medicine? Side effects that you should report to your doctor or health care professional as soon as possible: -allergic reactions like skin rash, itching or hives, swelling of the face, lips, or tongue -dizziness -fever -pain, redness, or irritation at site where injected -pinpoint red spots on the skin -red or dark-brown urine -shortness of breath or breathing problems -stomach or   side pain, or pain at the shoulder -swelling -tiredness -trouble passing urine or change in the amount of urine Side effects that usually do not require medical attention (report to your doctor or health care professional if they  continue or are bothersome): -bone pain -muscle pain This list may not describe all possible side effects. Call your doctor for medical advice about side effects. You may report side effects to FDA at 1-800-FDA-1088. Where should I keep my medicine? Keep out of the reach of children. Store pre-filled syringes in a refrigerator between 2 and 8 degrees C (36 and 46 degrees F). Do not freeze. Keep in carton to protect from light. Throw away this medicine if it is left out of the refrigerator for more than 48 hours. Throw away any unused medicine after the expiration date. NOTE: This sheet is a summary. It may not cover all possible information. If you have questions about this medicine, talk to your doctor, pharmacist, or health care provider.  2017 Elsevier/Gold Standard (2014-05-15 14:30:14)  

## 2016-04-04 ENCOUNTER — Telehealth: Payer: Self-pay | Admitting: *Deleted

## 2016-04-04 ENCOUNTER — Ambulatory Visit: Payer: PPO | Admitting: Nutrition

## 2016-04-04 NOTE — Telephone Encounter (Signed)
Chemo follow up call. Patient states he is feeling well. Diarrhea first day after chemo. Has decreased appetite.denies n/v . Has had weight loss and saw barb neff today with some suggestions. Patient knows to call for any issues.

## 2016-04-04 NOTE — Progress Notes (Signed)
70 year old male diagnosed with prostate cancer.  He is a patient of Dr. Alen Blew.  Past medical history includes tobacco, melanoma, hyperlipidemia, hypercholesterolemia, GERD, diabetes type 2, and BPH.  Medications include Lipitor, Glucotrol XL, Glucophage, MVI, Actos, Compazine, and Januvia.  Labs include glucose of 70.  Height: 5 feet 10 1/2 inches. Weight: 176.2 pounds. Usual body weight: 195 pounds in August. BMI: 24.92.  Patient admits to unintentional weight loss and poor appetite. He reports he did have decreased oral intake. His blood sugars are better controlled. Reports 2 loose stools daily.  Nutrition diagnosis: Unintentional weight loss related to inadequate oral intake as evidenced by 10% weight loss in 3 months.  Intervention: Educated patient to consume high-calorie, high-protein foods 6 times daily. Recommended weight maintenance. Educated patient on strategies for improving diarrhea. Provided samples of no added sugar Carnation breakfast essentials along with coupons. Gave patient fact sheets and answered his questions. Teach back method used.  Contact information given.  Monitoring, evaluation, goals:  Patient will tolerate adequate calories and protein for weight maintenance and adequate glycemic control.  Next visit: To be scheduled as needed.  **Disclaimer: This note was dictated with voice recognition software. Similar sounding words can inadvertently be transcribed and this note may contain transcription errors which may not have been corrected upon publication of note.**

## 2016-04-10 ENCOUNTER — Encounter: Payer: Self-pay | Admitting: Internal Medicine

## 2016-04-11 ENCOUNTER — Encounter: Payer: Self-pay | Admitting: Internal Medicine

## 2016-04-14 ENCOUNTER — Telehealth: Payer: Self-pay | Admitting: *Deleted

## 2016-04-14 NOTE — Telephone Encounter (Signed)
Patient calling to say his brothers from [place] want to come and visit him mid-January. One brother is being treated for a mild case of shingles. Would it be okay if he visits?

## 2016-04-14 NOTE — Telephone Encounter (Signed)
Spoke with patient, per dr Alen Blew, okay for brother to visit from [place]

## 2016-04-14 NOTE — Telephone Encounter (Signed)
Yes

## 2016-04-18 ENCOUNTER — Encounter: Payer: Self-pay | Admitting: Internal Medicine

## 2016-04-19 ENCOUNTER — Ambulatory Visit (HOSPITAL_BASED_OUTPATIENT_CLINIC_OR_DEPARTMENT_OTHER): Payer: PPO

## 2016-04-19 ENCOUNTER — Ambulatory Visit: Payer: PPO

## 2016-04-19 ENCOUNTER — Telehealth: Payer: Self-pay | Admitting: Oncology

## 2016-04-19 ENCOUNTER — Ambulatory Visit (HOSPITAL_BASED_OUTPATIENT_CLINIC_OR_DEPARTMENT_OTHER): Payer: PPO | Admitting: Oncology

## 2016-04-19 ENCOUNTER — Other Ambulatory Visit (HOSPITAL_BASED_OUTPATIENT_CLINIC_OR_DEPARTMENT_OTHER): Payer: PPO

## 2016-04-19 ENCOUNTER — Encounter: Payer: Self-pay | Admitting: Medical Oncology

## 2016-04-19 VITALS — BP 143/62 | HR 78 | Temp 97.7°F | Resp 18 | Ht 70.5 in | Wt 182.7 lb

## 2016-04-19 DIAGNOSIS — Z5111 Encounter for antineoplastic chemotherapy: Secondary | ICD-10-CM | POA: Diagnosis not present

## 2016-04-19 DIAGNOSIS — C61 Malignant neoplasm of prostate: Secondary | ICD-10-CM

## 2016-04-19 DIAGNOSIS — C786 Secondary malignant neoplasm of retroperitoneum and peritoneum: Secondary | ICD-10-CM

## 2016-04-19 DIAGNOSIS — R634 Abnormal weight loss: Secondary | ICD-10-CM | POA: Diagnosis not present

## 2016-04-19 DIAGNOSIS — Z95828 Presence of other vascular implants and grafts: Secondary | ICD-10-CM

## 2016-04-19 LAB — CBC WITH DIFFERENTIAL/PLATELET
BASO%: 0.3 % (ref 0.0–2.0)
Basophils Absolute: 0 10*3/uL (ref 0.0–0.1)
EOS ABS: 0 10*3/uL (ref 0.0–0.5)
EOS%: 0.2 % (ref 0.0–7.0)
HEMATOCRIT: 32 % — AB (ref 38.4–49.9)
HEMOGLOBIN: 9.9 g/dL — AB (ref 13.0–17.1)
LYMPH#: 1 10*3/uL (ref 0.9–3.3)
LYMPH%: 9.5 % — ABNORMAL LOW (ref 14.0–49.0)
MCH: 26.9 pg — ABNORMAL LOW (ref 27.2–33.4)
MCHC: 30.9 g/dL — ABNORMAL LOW (ref 32.0–36.0)
MCV: 87 fL (ref 79.3–98.0)
MONO#: 1.1 10*3/uL — AB (ref 0.1–0.9)
MONO%: 10.6 % (ref 0.0–14.0)
NEUT%: 79.4 % — ABNORMAL HIGH (ref 39.0–75.0)
NEUTROS ABS: 8.2 10*3/uL — AB (ref 1.5–6.5)
PLATELETS: 497 10*3/uL — AB (ref 140–400)
RBC: 3.68 10*6/uL — ABNORMAL LOW (ref 4.20–5.82)
RDW: 16.9 % — AB (ref 11.0–14.6)
WBC: 10.3 10*3/uL (ref 4.0–10.3)

## 2016-04-19 LAB — COMPREHENSIVE METABOLIC PANEL
ALBUMIN: 3.3 g/dL — AB (ref 3.5–5.0)
ALK PHOS: 61 U/L (ref 40–150)
ALT: 17 U/L (ref 0–55)
ANION GAP: 11 meq/L (ref 3–11)
AST: 18 U/L (ref 5–34)
BILIRUBIN TOTAL: 0.38 mg/dL (ref 0.20–1.20)
BUN: 13.5 mg/dL (ref 7.0–26.0)
CO2: 22 mEq/L (ref 22–29)
CREATININE: 0.8 mg/dL (ref 0.7–1.3)
Calcium: 9.3 mg/dL (ref 8.4–10.4)
Chloride: 107 mEq/L (ref 98–109)
EGFR: 90 mL/min/{1.73_m2} — AB (ref >90)
Glucose: 180 mg/dl — ABNORMAL HIGH (ref 70–140)
Potassium: 4.1 mEq/L (ref 3.5–5.1)
Sodium: 140 mEq/L (ref 136–145)
TOTAL PROTEIN: 6.7 g/dL (ref 6.4–8.3)

## 2016-04-19 MED ORDER — DEXAMETHASONE SODIUM PHOSPHATE 10 MG/ML IJ SOLN
10.0000 mg | Freq: Once | INTRAMUSCULAR | Status: AC
  Administered 2016-04-19: 10 mg via INTRAVENOUS

## 2016-04-19 MED ORDER — SODIUM CHLORIDE 0.9% FLUSH
10.0000 mL | INTRAVENOUS | Status: DC | PRN
  Administered 2016-04-19: 10 mL via INTRAVENOUS
  Filled 2016-04-19: qty 10

## 2016-04-19 MED ORDER — SODIUM CHLORIDE 0.9% FLUSH
10.0000 mL | INTRAVENOUS | Status: DC | PRN
  Administered 2016-04-19: 10 mL
  Filled 2016-04-19: qty 10

## 2016-04-19 MED ORDER — SODIUM CHLORIDE 0.9 % IV SOLN
Freq: Once | INTRAVENOUS | Status: AC
  Administered 2016-04-19: 09:00:00 via INTRAVENOUS

## 2016-04-19 MED ORDER — HEPARIN SOD (PORK) LOCK FLUSH 100 UNIT/ML IV SOLN
500.0000 [IU] | Freq: Once | INTRAVENOUS | Status: AC | PRN
  Administered 2016-04-19: 500 [IU]
  Filled 2016-04-19: qty 5

## 2016-04-19 MED ORDER — SODIUM CHLORIDE 0.9 % IV SOLN
75.0000 mg/m2 | Freq: Once | INTRAVENOUS | Status: AC
  Administered 2016-04-19: 150 mg via INTRAVENOUS
  Filled 2016-04-19: qty 15

## 2016-04-19 MED ORDER — DEXAMETHASONE SODIUM PHOSPHATE 10 MG/ML IJ SOLN
INTRAMUSCULAR | Status: AC
  Filled 2016-04-19: qty 1

## 2016-04-19 MED ORDER — DOCETAXEL CHEMO INJECTION 160 MG/16ML: 75.0000 mg/m2 | Freq: Once | INTRAVENOUS | Status: DC

## 2016-04-19 NOTE — Patient Instructions (Signed)
[facility] Discharge Instructions for Patients Receiving Chemotherapy  Today you received the following chemotherapy agents:  Taxotere.  To help prevent nausea and vomiting after your treatment, we encourage you to take your nausea medication as directed.   If you develop nausea and vomiting that is not controlled by your nausea medication, call the clinic.   BELOW ARE SYMPTOMS THAT SHOULD BE REPORTED IMMEDIATELY:  *FEVER GREATER THAN 100.5 F  *CHILLS WITH OR WITHOUT FEVER  NAUSEA AND VOMITING THAT IS NOT CONTROLLED WITH YOUR NAUSEA MEDICATION  *UNUSUAL SHORTNESS OF BREATH  *UNUSUAL BRUISING OR BLEEDING  TENDERNESS IN MOUTH AND THROAT WITH OR WITHOUT PRESENCE OF ULCERS  *URINARY PROBLEMS  *BOWEL PROBLEMS  UNUSUAL RASH Items with * indicate a potential emergency and should be followed up as soon as possible.  Feel free to call the clinic you have any questions or concerns. The clinic phone number is [phone].  Please show the CHEMO ALERT CARD at check-in to the Emergency Department and triage nurse.  

## 2016-04-19 NOTE — Progress Notes (Signed)
Hematology and Oncology Follow Up Visit  SHAWNN BOUILLON [id number] Nov 26, 1945 70 y.o. 04/19/2016 8:37 AM [name], [name], MD   Principle Diagnosis: 70 year old gentleman with prostate cancer diagnosed in 2013 after presenting with a PSA of 3.6 and a Gleason score 5+4 = 9. He has peritoneal carcinomatosis biopsy proven to be adenocarcinoma likely of a prostate primary.   Prior Therapy: He is status post robotic-assisted laparoscopic radical prostatectomy and extended lymphadenectomy with the pathology reveals T2N0.  completed in June 2013.  Current therapy: Taxotere chemotherapy at 75 mg/m every 3 weeks started on 03/30/2016.  Interim History: Mr. Winker presents today for a follow-up visit. Since the last visit, he received the first cycle of chemotherapy and tolerated it well. He does report some mild diarrhea that have subsided after a few days. He denied any peripheral neuropathy. He did have arthralgias and myalgias are grade 1 associated with Neulasta which have resolved at this time. He denied any nausea, vomiting and no longer reporting abdominal pain.   His appetite has improved and has gained a few pounds since the last visit. His performance status, activity level and quality of life dramatically improved after 1 cycle of chemotherapy.   He does not report any headaches, blurry vision, syncope or seizures. He does not report any fevers, chills, sweats. He does not report any chest pain, palpitation, orthopnea or leg edema. He does not report any cough, wheezing or hemoptysis. He does not report any hematochezia, melena or diarrhea at this time. He has not moved his bowels last few days. He does not report any frequency, urgency or hematuria. He does not report any skeletal complaints. Remaining review of systems and   Medications: I have reviewed the patient's current medications.  Current Outpatient Prescriptions  Medication Sig Dispense Refill  . aspirin 325 MG  tablet Take 325 mg by mouth daily with breakfast.     . atorvastatin (LIPITOR) 10 MG tablet Take 10 mg by mouth daily with breakfast.     . Blood Glucose Monitoring Suppl (ONE TOUCH ULTRA 2) w/Device KIT Use to check blood sugar 2 times per day. 1 each 2  . glipiZIDE (GLUCOTROL XL) 5 MG 24 hr tablet Take 1 tablet (5 mg total) by mouth daily with breakfast. 90 tablet 0  . glucose blood (ONE TOUCH ULTRA TEST) test strip Use to check blood sugar 2 times per day. 200 each 5  . HYDROcodone-acetaminophen (NORCO/VICODIN) 5-325 MG tablet Take 1-2 tablets by mouth every 6 (six) hours as needed for moderate pain. 20 tablet 0  . lidocaine-prilocaine (EMLA) cream Apply 1 application topically as needed. Apply to port before chemotherapy. 30 g 0  . losartan (COZAAR) 25 MG tablet     . metFORMIN (GLUCOPHAGE) 1000 MG tablet Take 1 tablet (1,000 mg total) by mouth 2 (two) times daily with a meal. 90 tablet 0  . Multiple Vitamin (MULTIVITAMIN) tablet Take 1 tablet by mouth daily.    [name] omeprazole (PRILOSEC) 20 MG capsule Take 20 mg by mouth every morning.    [name] DELICA LANCETS 89Q MISC Use to check blood sugar 2 times per day. 200 each 2  . pioglitazone (ACTOS) 30 MG tablet Take 1 tablet (30 mg total) by mouth daily. 90 tablet 0  . prochlorperazine (COMPAZINE) 10 MG tablet Take 1 tablet (10 mg total) by mouth every 6 (six) hours as needed for nausea or vomiting. 30 tablet 0  . pseudoephedrine-acetaminophen (TYLENOL SINUS) 30-500 MG TABS tablet Take 1  tablet by mouth every 4 (four) hours as needed (congestion).     . sitaGLIPtin (JANUVIA) 100 MG tablet Take 100 mg by mouth daily.     No current facility-administered medications for this visit.      Allergies:  Allergies  Allergen Reactions  . Scallops [Shellfish Allergy] Nausea And Vomiting  . Cephalexin Hives and Rash    Past Medical History, Surgical history, Social history, and Family History were reviewed and updated.   Physical Exam: Blood  pressure (!) 143/62, pulse 78, temperature 97.7 F (36.5 C), temperature source Oral, resp. rate 18, height 5' 10.5" (1.791 m), weight 182 lb 11.2 oz (82.9 kg), SpO2 99 %. ECOG: 1 General appearance: Well-appearing gentleman appeared without distress.  Head: Normocephalic, without obvious abnormality no oral thrush noted. Neck: no adenopathy Lymph nodes: Cervical, supraclavicular, and axillary nodes normal. Heart:regular rate and rhythm, S1, S2 normal, no murmur, click, rub or gallop Lung:chest clear, no wheezing, rales, normal symmetric air entry Abdomin: soft, without masses or organomegaly. Less distended. No rebound or guarding. EXT:no erythema, induration, or nodules   Lab Results: Lab Results  Component Value Date   WBC 10.3 04/19/2016   HGB 9.9 (L) 04/19/2016   HCT 32.0 (L) 04/19/2016   MCV 87.0 04/19/2016   PLT 497 (H) 04/19/2016     Chemistry      Component Value Date/Time   NA 142 03/30/2016 0954   K 4.3 03/30/2016 0954   CL 102 03/05/2016 1459   CO2 23 03/30/2016 0954   BUN 16.8 03/30/2016 0954   CREATININE 0.8 03/30/2016 0954   GLU 155 08/24/2015      Component Value Date/Time   CALCIUM 9.6 03/30/2016 0954   ALKPHOS 47 03/30/2016 0954   AST 25 03/30/2016 0954   ALT 23 03/30/2016 0954   BILITOT 0.32 03/30/2016 0954     Results for JAMAREON, SHIMEL (MRN [id number]) as of 04/19/2016 08:16  Ref. Range 03/30/2016 09:54  PSA Latest Ref Range: 0.0 - 4.0 ng/mL 1.3     Impression and Plan:   70 year old gentleman with the following issues:  1. 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 This was noted after presenting with symptoms of nausea, diarrhea and vomiting. He reports poor by mouth intake and weight loss. He does have history of prostate cancer Gleason score 5+4 = 9 resected in 2013.  He is status post biopsy obtained on 03/14/2016 and the final pathology showed metastatic carcinoma that is poorly differentiated that favors prostate  cancer. Although the specimen did not stain for PSA his tumor is poorly differentiated likely secretes very little to no PSA. His last PSA was 1.3.  He is currently receiving Taxotere salvage chemotherapy started on 03/30/2016. He tolerated the first cycle well with improvement in his quality of life and overall symptoms. The plan is to proceed with cycle 2 without any dose reduction or delay.  2. Abdominal pain: Much improved at this time and he is no longer taking any pain medication.  3. Antiemetics: Prescription for Compazine was made available to the patient. No nausea or vomiting reported.  4. IV access: Port-A-Cath placed without complications.  5. Neutropenia prophylaxis: He will receive Neulasta after each injection.  6. Follow-up: Will be in 3 weeks for cycle 3 of chemotherapy.    [facility], MD 12/12/20178:37 AM

## 2016-04-19 NOTE — Telephone Encounter (Signed)
Message sent to chemo scheduler to be added per 04/19/16 los. Appointments scheduled per 04/19/16 los. A copy of the AVS report and appointment schedule was given to the patient, per 04/19/16 los.

## 2016-04-20 ENCOUNTER — Telehealth: Payer: Self-pay | Admitting: *Deleted

## 2016-04-20 ENCOUNTER — Other Ambulatory Visit: Payer: Self-pay

## 2016-04-20 LAB — PSA: PROSTATE SPECIFIC AG, SERUM: 0.9 ng/mL (ref 0.0–4.0)

## 2016-04-20 MED ORDER — GLIPIZIDE ER 5 MG PO TB24: 5.0000 mg | ORAL_TABLET | Freq: Every day | ORAL | 2 refills | Status: DC

## 2016-04-20 NOTE — Telephone Encounter (Signed)
Per LOS I have scheduled apptas and notified the scheduler

## 2016-04-20 NOTE — Telephone Encounter (Signed)
Open in error

## 2016-04-21 ENCOUNTER — Ambulatory Visit (HOSPITAL_BASED_OUTPATIENT_CLINIC_OR_DEPARTMENT_OTHER): Payer: PPO

## 2016-04-21 VITALS — BP 138/66 | HR 86 | Temp 98.1°F | Resp 16

## 2016-04-21 DIAGNOSIS — C61 Malignant neoplasm of prostate: Secondary | ICD-10-CM | POA: Diagnosis not present

## 2016-04-21 DIAGNOSIS — Z5189 Encounter for other specified aftercare: Secondary | ICD-10-CM | POA: Diagnosis not present

## 2016-04-21 MED ORDER — PEGFILGRASTIM INJECTION 6 MG/0.6ML ~~LOC~~
6.0000 mg | PREFILLED_SYRINGE | Freq: Once | SUBCUTANEOUS | Status: AC
  Administered 2016-04-21: 6 mg via SUBCUTANEOUS
  Filled 2016-04-21: qty 0.6

## 2016-04-29 ENCOUNTER — Encounter (HOSPITAL_COMMUNITY): Payer: Self-pay

## 2016-05-10 ENCOUNTER — Other Ambulatory Visit (HOSPITAL_BASED_OUTPATIENT_CLINIC_OR_DEPARTMENT_OTHER): Payer: PPO

## 2016-05-10 ENCOUNTER — Ambulatory Visit (HOSPITAL_BASED_OUTPATIENT_CLINIC_OR_DEPARTMENT_OTHER): Payer: PPO

## 2016-05-10 ENCOUNTER — Telehealth: Payer: Self-pay | Admitting: Oncology

## 2016-05-10 ENCOUNTER — Ambulatory Visit: Payer: PPO

## 2016-05-10 ENCOUNTER — Ambulatory Visit (HOSPITAL_BASED_OUTPATIENT_CLINIC_OR_DEPARTMENT_OTHER): Payer: PPO | Admitting: Oncology

## 2016-05-10 VITALS — BP 149/57 | HR 81 | Temp 98.1°F | Resp 18 | Ht 70.5 in | Wt 184.6 lb

## 2016-05-10 DIAGNOSIS — C61 Malignant neoplasm of prostate: Secondary | ICD-10-CM | POA: Diagnosis not present

## 2016-05-10 DIAGNOSIS — C800 Disseminated malignant neoplasm, unspecified: Secondary | ICD-10-CM | POA: Diagnosis not present

## 2016-05-10 DIAGNOSIS — Z5111 Encounter for antineoplastic chemotherapy: Secondary | ICD-10-CM | POA: Diagnosis not present

## 2016-05-10 DIAGNOSIS — Z95828 Presence of other vascular implants and grafts: Secondary | ICD-10-CM

## 2016-05-10 LAB — CBC WITH DIFFERENTIAL/PLATELET
BASO%: 0.6 % (ref 0.0–2.0)
BASOS ABS: 0.1 10*3/uL (ref 0.0–0.1)
EOS%: 0.3 % (ref 0.0–7.0)
Eosinophils Absolute: 0 10*3/uL (ref 0.0–0.5)
HEMATOCRIT: 31 % — AB (ref 38.4–49.9)
HGB: 9.6 g/dL — ABNORMAL LOW (ref 13.0–17.1)
LYMPH%: 9.9 % — AB (ref 14.0–49.0)
MCH: 27.4 pg (ref 27.2–33.4)
MCHC: 31 g/dL — AB (ref 32.0–36.0)
MCV: 88.3 fL (ref 79.3–98.0)
MONO#: 0.9 10*3/uL (ref 0.1–0.9)
MONO%: 11.7 % (ref 0.0–14.0)
NEUT#: 6.2 10*3/uL (ref 1.5–6.5)
NEUT%: 77.5 % — ABNORMAL HIGH (ref 39.0–75.0)
PLATELETS: 383 10*3/uL (ref 140–400)
RBC: 3.51 10*6/uL — AB (ref 4.20–5.82)
RDW: 18.3 % — ABNORMAL HIGH (ref 11.0–14.6)
WBC: 8 10*3/uL (ref 4.0–10.3)
lymph#: 0.8 10*3/uL — ABNORMAL LOW (ref 0.9–3.3)

## 2016-05-10 LAB — COMPREHENSIVE METABOLIC PANEL
ALT: 19 U/L (ref 0–55)
ANION GAP: 10 meq/L (ref 3–11)
AST: 19 U/L (ref 5–34)
Albumin: 3.5 g/dL (ref 3.5–5.0)
Alkaline Phosphatase: 52 U/L (ref 40–150)
BUN: 15.9 mg/dL (ref 7.0–26.0)
CALCIUM: 9.3 mg/dL (ref 8.4–10.4)
CHLORIDE: 107 meq/L (ref 98–109)
CO2: 23 meq/L (ref 22–29)
Creatinine: 0.9 mg/dL (ref 0.7–1.3)
EGFR: 88 mL/min/{1.73_m2} — AB (ref >90)
Glucose: 184 mg/dl — ABNORMAL HIGH (ref 70–140)
POTASSIUM: 4.3 meq/L (ref 3.5–5.1)
Sodium: 140 mEq/L (ref 136–145)
Total Bilirubin: 0.36 mg/dL (ref 0.20–1.20)
Total Protein: 6.6 g/dL (ref 6.4–8.3)

## 2016-05-10 MED ORDER — HEPARIN SOD (PORK) LOCK FLUSH 100 UNIT/ML IV SOLN
500.0000 [IU] | Freq: Once | INTRAVENOUS | Status: AC | PRN
  Administered 2016-05-10: 500 [IU]
  Filled 2016-05-10: qty 5

## 2016-05-10 MED ORDER — DOCETAXEL CHEMO INJECTION 160 MG/16ML
75.0000 mg/m2 | Freq: Once | INTRAVENOUS | Status: AC
  Administered 2016-05-10: 150 mg via INTRAVENOUS
  Filled 2016-05-10: qty 15

## 2016-05-10 MED ORDER — SODIUM CHLORIDE 0.9% FLUSH
10.0000 mL | INTRAVENOUS | Status: DC | PRN
  Administered 2016-05-10: 10 mL
  Filled 2016-05-10: qty 10

## 2016-05-10 MED ORDER — DEXAMETHASONE SODIUM PHOSPHATE 10 MG/ML IJ SOLN
INTRAMUSCULAR | Status: AC
  Filled 2016-05-10: qty 1

## 2016-05-10 MED ORDER — SODIUM CHLORIDE 0.9 % IV SOLN
Freq: Once | INTRAVENOUS | Status: AC
  Administered 2016-05-10: 11:00:00 via INTRAVENOUS

## 2016-05-10 MED ORDER — SODIUM CHLORIDE 0.9% FLUSH
10.0000 mL | INTRAVENOUS | Status: DC | PRN
Start: 2016-05-10 — End: 2016-05-10
  Administered 2016-05-10: 10 mL via INTRAVENOUS
  Filled 2016-05-10: qty 10

## 2016-05-10 MED ORDER — DEXAMETHASONE SODIUM PHOSPHATE 10 MG/ML IJ SOLN
10.0000 mg | Freq: Once | INTRAMUSCULAR | Status: AC
  Administered 2016-05-10: 10 mg via INTRAVENOUS

## 2016-05-10 NOTE — Patient Instructions (Signed)
[facility] Discharge Instructions for Patients Receiving Chemotherapy  Today you received the following chemotherapy agents Taxotere.   To help prevent nausea and vomiting after your treatment, we encourage you to take your nausea medication.   If you develop nausea and vomiting that is not controlled by your nausea medication, call the clinic.   BELOW ARE SYMPTOMS THAT SHOULD BE REPORTED IMMEDIATELY:  *FEVER GREATER THAN 100.5 F  *CHILLS WITH OR WITHOUT FEVER  NAUSEA AND VOMITING THAT IS NOT CONTROLLED WITH YOUR NAUSEA MEDICATION  *UNUSUAL SHORTNESS OF BREATH  *UNUSUAL BRUISING OR BLEEDING  TENDERNESS IN MOUTH AND THROAT WITH OR WITHOUT PRESENCE OF ULCERS  *URINARY PROBLEMS  *BOWEL PROBLEMS  UNUSUAL RASH Items with * indicate a potential emergency and should be followed up as soon as possible.  Feel free to call the clinic you have any questions or concerns. The clinic phone number is [phone].  Please show the CHEMO ALERT CARD at check-in to the Emergency Department and triage nurse.   

## 2016-05-10 NOTE — Progress Notes (Signed)
Hematology and Oncology Follow Up Visit  Frank Larsen [id number] [birth year] 71 y.o. 05/10/2016 10:32 AM [name], [name], MD   Principle Diagnosis: 71 year old gentleman with prostate cancer diagnosed in 2013 after presenting with a PSA of 3.6 and a Gleason score 5+4 = 9. He has peritoneal carcinomatosis biopsy proven to be adenocarcinoma likely of a prostate primary.   Prior Therapy: He is status post robotic-assisted laparoscopic radical prostatectomy and extended lymphadenectomy with the pathology reveals T2N0.  completed in June 2013.  Current therapy: Taxotere chemotherapy at 75 mg/m every 3 weeks started on 03/30/2016. He is here for cycle 3 of therapy.  Interim History: [name] presents today for a follow-up visit. Since the last visit, he reports continuous improvement in his health. He continues to receive chemotherapy without complications. He denied any peripheral neuropathy. He did have arthralgias and myalgias are grade 1 associated with Neulasta which have resolved at this time. He denied any nausea, vomiting and no longer reporting abdominal pain. His appetite is much improved and does not take any pain medication at this time. His quality of life and performance status continues to improve on chemotherapy.   He does not report any headaches, blurry vision, syncope or seizures. He does not report any fevers, chills, sweats. He does not report any chest pain, palpitation, orthopnea or leg edema. He does not report any cough, wheezing or hemoptysis. He does not report any hematochezia, melena or diarrhea at this time. He has not moved his bowels last few days. He does not report any frequency, urgency or hematuria. He does not report any skeletal complaints. Remaining review of systems and   Medications: I have reviewed the patient's current medications.  Current Outpatient Prescriptions  Medication Sig Dispense Refill  . atorvastatin (LIPITOR) 10 MG tablet  Take 10 mg by mouth daily with breakfast.     . Blood Glucose Monitoring Suppl (ONE TOUCH ULTRA 2) w/Device KIT Use to check blood sugar 2 times per day. 1 each 2  . glipiZIDE (GLUCOTROL XL) 5 MG 24 hr tablet Take 1 tablet (5 mg total) by mouth daily with breakfast. 90 tablet 2  . glucose blood (ONE TOUCH ULTRA TEST) test strip Use to check blood sugar 2 times per day. 200 each 5  . HYDROcodone-acetaminophen (NORCO/VICODIN) 5-325 MG tablet Take 1-2 tablets by mouth every 6 (six) hours as needed for moderate pain. 20 tablet 0  . lidocaine-prilocaine (EMLA) cream Apply 1 application topically as needed. Apply to port before chemotherapy. 30 g 0  . metFORMIN (GLUCOPHAGE) 1000 MG tablet Take 1 tablet (1,000 mg total) by mouth 2 (two) times daily with a meal. 90 tablet 0  . Multiple Vitamin (MULTIVITAMIN) tablet Take 1 tablet by mouth daily.    [name] omeprazole (PRILOSEC) 20 MG capsule Take 20 mg by mouth every morning.    [name] DELICA LANCETS 08M MISC Use to check blood sugar 2 times per day. 200 each 2  . pioglitazone (ACTOS) 30 MG tablet Take 1 tablet (30 mg total) by mouth daily. 90 tablet 0  . prochlorperazine (COMPAZINE) 10 MG tablet Take 1 tablet (10 mg total) by mouth every 6 (six) hours as needed for nausea or vomiting. 30 tablet 0  . pseudoephedrine-acetaminophen (TYLENOL SINUS) 30-500 MG TABS tablet Take 1 tablet by mouth every 4 (four) hours as needed (congestion).      No current facility-administered medications for this visit.      Allergies:  Allergies  Allergen Reactions  .  Scallops [Shellfish Allergy] Nausea And Vomiting  . Cephalexin Hives and Rash    Past Medical History, Surgical history, Social history, and Family History were reviewed and updated.   Physical Exam: Blood pressure (!) 149/57, pulse 81, temperature 98.1 F (36.7 C), temperature source Oral, resp. rate 18, height 5' 10.5" (1.791 m), weight 184 lb 9.6 oz (83.7 kg), SpO2 99 %. ECOG: 1 General appearance:  Alert, awake gentleman without distress. Head: Normocephalic, without obvious abnormality no oral thrush noted. Neck: no adenopathy Lymph nodes: Cervical, supraclavicular, and axillary nodes normal. Heart:regular rate and rhythm, S1, S2 normal, no murmur, click, rub or gallop Lung:chest clear, no wheezing, rales, normal symmetric air entry Abdomin: soft, without masses or organomegaly. No rebound or guarding. No pain on palpation. EXT:no erythema, induration, or nodules   Lab Results: Lab Results  Component Value Date   WBC 8.0 05/10/2016   HGB 9.6 (L) 05/10/2016   HCT 31.0 (L) 05/10/2016   MCV 88.3 05/10/2016   PLT 383 05/10/2016     Chemistry      Component Value Date/Time   NA 140 05/10/2016 0940   K 4.3 05/10/2016 0940   CL 102 03/05/2016 1459   CO2 23 05/10/2016 0940   BUN 15.9 05/10/2016 0940   CREATININE 0.9 05/10/2016 0940   GLU 155 08/24/2015      Component Value Date/Time   CALCIUM 9.3 05/10/2016 0940   ALKPHOS 52 05/10/2016 0940   AST 19 05/10/2016 0940   ALT 19 05/10/2016 0940   BILITOT 0.36 05/10/2016 0940     Results for GERRALD, Larsen (MRN [id number]) as of 05/10/2016 10:14  Ref. Range 03/30/2016 09:54 04/19/2016 07:42  PSA Latest Ref Range: 0.0 - 4.0 ng/mL 1.3 0.9      Impression and Plan:   71 year old gentleman with the following issues:  1. 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 This was noted after presenting with symptoms of nausea, diarrhea and vomiting. He reports poor by mouth intake and weight loss. He does have history of prostate cancer Gleason score 5+4 = 9 resected in 2013.  He is status post biopsy obtained on 03/14/2016 and the final pathology showed metastatic carcinoma that is poorly differentiated that favors prostate cancer. Although the specimen did not stain for PSA his tumor is poorly differentiated likely secretes very little to no PSA. His last PSA was 1.3.  He is currently receiving Taxotere salvage  chemotherapy started on 03/30/2016. The plan is to continue the same dose and schedule given his excellent benefit enterolysis therapy. I plan on repeating a CT scan after cycle 5 of therapy.  2. Abdominal pain: Much improved at this time and he is no longer taking any pain medication.  3. Antiemetics: Prescription for Compazine was made available to the patient. No issues associated with that needed.  4. IV access: Port-A-Cath placed without complications.  5. Neutropenia prophylaxis: He will receive Neulasta after each injection.  6. Follow-up: Will be in 3 weeks for cycle 4 of chemotherapy.    Zola Button, MD 1/2/201810:32 AM

## 2016-05-10 NOTE — Telephone Encounter (Signed)
Appointments scheduled per 1/2 LOS. Patient given AVS report and calendars with future scheduled appointments. °

## 2016-05-11 LAB — PSA: PROSTATE SPECIFIC AG, SERUM: 0.5 ng/mL (ref 0.0–4.0)

## 2016-05-12 ENCOUNTER — Ambulatory Visit (HOSPITAL_BASED_OUTPATIENT_CLINIC_OR_DEPARTMENT_OTHER): Payer: PPO

## 2016-05-12 VITALS — BP 131/64 | HR 78 | Temp 97.7°F | Resp 20

## 2016-05-12 DIAGNOSIS — C61 Malignant neoplasm of prostate: Secondary | ICD-10-CM

## 2016-05-12 DIAGNOSIS — Z5189 Encounter for other specified aftercare: Secondary | ICD-10-CM | POA: Diagnosis not present

## 2016-05-12 MED ORDER — PEGFILGRASTIM INJECTION 6 MG/0.6ML ~~LOC~~
6.0000 mg | PREFILLED_SYRINGE | Freq: Once | SUBCUTANEOUS | Status: AC
Start: 2016-05-12 — End: 2016-05-12
  Administered 2016-05-12: 6 mg via SUBCUTANEOUS
  Filled 2016-05-12: qty 0.6

## 2016-05-12 NOTE — Patient Instructions (Signed)
Pegfilgrastim injection What is this medicine? PEGFILGRASTIM (PEG fil gra stim) is a long-acting granulocyte colony-stimulating factor that stimulates the growth of neutrophils, a type of white blood cell important in the body's fight against infection. It is used to reduce the incidence of fever and infection in patients with certain types of cancer who are receiving chemotherapy that affects the bone marrow, and to increase survival after being exposed to high doses of radiation. This medicine may be used for other purposes; ask your health care provider or pharmacist if you have questions. COMMON BRAND NAME(S): Neulasta What should I tell my health care provider before I take this medicine? They need to know if you have any of these conditions: -kidney disease -latex allergy -ongoing radiation therapy -sickle cell disease -skin reactions to acrylic adhesives (On-Body Injector only) -an unusual or allergic reaction to pegfilgrastim, filgrastim, other medicines, foods, dyes, or preservatives -pregnant or trying to get pregnant -breast-feeding How should I use this medicine? This medicine is for injection under the skin. If you get this medicine at home, you will be taught how to prepare and give the pre-filled syringe or how to use the On-body Injector. Refer to the patient Instructions for Use for detailed instructions. Use exactly as directed. Take your medicine at regular intervals. Do not take your medicine more often than directed. It is important that you put your used needles and syringes in a special sharps container. Do not put them in a trash can. If you do not have a sharps container, call your pharmacist or healthcare provider to get one. Talk to your pediatrician regarding the use of this medicine in children. While this drug may be prescribed for selected conditions, precautions do apply. Overdosage: If you think you have taken too much of this medicine contact a poison control  center or emergency room at once. NOTE: This medicine is only for you. Do not share this medicine with others. What if I miss a dose? It is important not to miss your dose. Call your doctor or health care professional if you miss your dose. If you miss a dose due to an On-body Injector failure or leakage, a new dose should be administered as soon as possible using a single prefilled syringe for manual use. What may interact with this medicine? Interactions have not been studied. Give your health care provider a list of all the medicines, herbs, non-prescription drugs, or dietary supplements you use. Also tell them if you smoke, drink alcohol, or use illegal drugs. Some items may interact with your medicine. This list may not describe all possible interactions. Give your health care provider a list of all the medicines, herbs, non-prescription drugs, or dietary supplements you use. Also tell them if you smoke, drink alcohol, or use illegal drugs. Some items may interact with your medicine. What should I watch for while using this medicine? You may need blood work done while you are taking this medicine. If you are going to need a MRI, CT scan, or other procedure, tell your doctor that you are using this medicine (On-Body Injector only). What side effects may I notice from receiving this medicine? Side effects that you should report to your doctor or health care professional as soon as possible: -allergic reactions like skin rash, itching or hives, swelling of the face, lips, or tongue -dizziness -fever -pain, redness, or irritation at site where injected -pinpoint red spots on the skin -red or dark-brown urine -shortness of breath or breathing problems -stomach or   side pain, or pain at the shoulder -swelling -tiredness -trouble passing urine or change in the amount of urine Side effects that usually do not require medical attention (report to your doctor or health care professional if they  continue or are bothersome): -bone pain -muscle pain This list may not describe all possible side effects. Call your doctor for medical advice about side effects. You may report side effects to FDA at 1-800-FDA-1088. Where should I keep my medicine? Keep out of the reach of children. Store pre-filled syringes in a refrigerator between 2 and 8 degrees C (36 and 46 degrees F). Do not freeze. Keep in carton to protect from light. Throw away this medicine if it is left out of the refrigerator for more than 48 hours. Throw away any unused medicine after the expiration date. NOTE: This sheet is a summary. It may not cover all possible information. If you have questions about this medicine, talk to your doctor, pharmacist, or health care provider.  2017 Elsevier/Gold Standard (2014-05-15 14:30:14)  

## 2016-05-31 ENCOUNTER — Telehealth: Payer: Self-pay | Admitting: Oncology

## 2016-05-31 ENCOUNTER — Ambulatory Visit (HOSPITAL_BASED_OUTPATIENT_CLINIC_OR_DEPARTMENT_OTHER): Payer: PPO

## 2016-05-31 ENCOUNTER — Ambulatory Visit: Payer: PPO

## 2016-05-31 ENCOUNTER — Ambulatory Visit (HOSPITAL_BASED_OUTPATIENT_CLINIC_OR_DEPARTMENT_OTHER): Payer: PPO | Admitting: Oncology

## 2016-05-31 ENCOUNTER — Telehealth: Payer: Self-pay | Admitting: *Deleted

## 2016-05-31 ENCOUNTER — Other Ambulatory Visit (HOSPITAL_BASED_OUTPATIENT_CLINIC_OR_DEPARTMENT_OTHER): Payer: PPO

## 2016-05-31 VITALS — BP 132/48 | HR 81 | Temp 98.2°F | Resp 18 | Ht 70.5 in | Wt 189.3 lb

## 2016-05-31 DIAGNOSIS — Z95828 Presence of other vascular implants and grafts: Secondary | ICD-10-CM

## 2016-05-31 DIAGNOSIS — C61 Malignant neoplasm of prostate: Secondary | ICD-10-CM | POA: Diagnosis not present

## 2016-05-31 DIAGNOSIS — Z5111 Encounter for antineoplastic chemotherapy: Secondary | ICD-10-CM

## 2016-05-31 LAB — COMPREHENSIVE METABOLIC PANEL
ALT: 17 U/L (ref 0–55)
AST: 20 U/L (ref 5–34)
Albumin: 3.4 g/dL — ABNORMAL LOW (ref 3.5–5.0)
Alkaline Phosphatase: 49 U/L (ref 40–150)
Anion Gap: 8 mEq/L (ref 3–11)
BILIRUBIN TOTAL: 0.36 mg/dL (ref 0.20–1.20)
BUN: 13.7 mg/dL (ref 7.0–26.0)
CO2: 22 meq/L (ref 22–29)
CREATININE: 0.8 mg/dL (ref 0.7–1.3)
Calcium: 9.1 mg/dL (ref 8.4–10.4)
Chloride: 110 mEq/L — ABNORMAL HIGH (ref 98–109)
EGFR: 89 mL/min/{1.73_m2} — ABNORMAL LOW (ref >90)
GLUCOSE: 207 mg/dL — AB (ref 70–140)
Potassium: 4.4 mEq/L (ref 3.5–5.1)
SODIUM: 139 meq/L (ref 136–145)
TOTAL PROTEIN: 6.2 g/dL — AB (ref 6.4–8.3)

## 2016-05-31 LAB — CBC WITH DIFFERENTIAL/PLATELET
BASO%: 2.3 % — AB (ref 0.0–2.0)
Basophils Absolute: 0.1 10*3/uL (ref 0.0–0.1)
EOS%: 0.7 % (ref 0.0–7.0)
Eosinophils Absolute: 0 10*3/uL (ref 0.0–0.5)
HCT: 28.3 % — ABNORMAL LOW (ref 38.4–49.9)
HGB: 9.2 g/dL — ABNORMAL LOW (ref 13.0–17.1)
LYMPH%: 10.1 % — AB (ref 14.0–49.0)
MCH: 28.2 pg (ref 27.2–33.4)
MCHC: 32.6 g/dL (ref 32.0–36.0)
MCV: 86.6 fL (ref 79.3–98.0)
MONO#: 0.7 10*3/uL (ref 0.1–0.9)
MONO%: 13.7 % (ref 0.0–14.0)
NEUT%: 73.2 % (ref 39.0–75.0)
NEUTROS ABS: 3.9 10*3/uL (ref 1.5–6.5)
Platelets: 388 10*3/uL (ref 140–400)
RBC: 3.27 10*6/uL — AB (ref 4.20–5.82)
RDW: 20 % — AB (ref 11.0–14.6)
WBC: 5.3 10*3/uL (ref 4.0–10.3)
lymph#: 0.5 10*3/uL — ABNORMAL LOW (ref 0.9–3.3)

## 2016-05-31 MED ORDER — SODIUM CHLORIDE 0.9% FLUSH
10.0000 mL | INTRAVENOUS | Status: DC | PRN
  Administered 2016-05-31: 10 mL via INTRAVENOUS
  Filled 2016-05-31: qty 10

## 2016-05-31 MED ORDER — SODIUM CHLORIDE 0.9 % IV SOLN
75.0000 mg/m2 | Freq: Once | INTRAVENOUS | Status: AC
  Administered 2016-05-31: 150 mg via INTRAVENOUS
  Filled 2016-05-31: qty 15

## 2016-05-31 MED ORDER — HEPARIN SOD (PORK) LOCK FLUSH 100 UNIT/ML IV SOLN
500.0000 [IU] | Freq: Once | INTRAVENOUS | Status: AC | PRN
  Administered 2016-05-31: 500 [IU]
  Filled 2016-05-31: qty 5

## 2016-05-31 MED ORDER — DEXAMETHASONE SODIUM PHOSPHATE 10 MG/ML IJ SOLN
INTRAMUSCULAR | Status: AC
  Filled 2016-05-31: qty 1

## 2016-05-31 MED ORDER — DEXAMETHASONE SODIUM PHOSPHATE 10 MG/ML IJ SOLN
10.0000 mg | Freq: Once | INTRAMUSCULAR | Status: AC
  Administered 2016-05-31: 10 mg via INTRAVENOUS

## 2016-05-31 MED ORDER — SODIUM CHLORIDE 0.9 % IV SOLN
Freq: Once | INTRAVENOUS | Status: AC
  Administered 2016-05-31: 09:00:00 via INTRAVENOUS

## 2016-05-31 MED ORDER — SODIUM CHLORIDE 0.9% FLUSH
10.0000 mL | INTRAVENOUS | Status: DC | PRN
  Administered 2016-05-31: 10 mL
  Filled 2016-05-31: qty 10

## 2016-05-31 NOTE — Telephone Encounter (Signed)
Message sent to chemo scheduler to be added per 05/31/16 los. Appointments scheduled per 05/31/16 los. Patient was given a copy of the appointment schedule and AVS report, per 05/31/16 los.

## 2016-05-31 NOTE — Telephone Encounter (Signed)
2 bottles of contrast given to patient with instructions, per CT ordered. 05/31/16

## 2016-05-31 NOTE — Patient Instructions (Signed)
[facility] Discharge Instructions for Patients Receiving Chemotherapy  Today you received the following chemotherapy agents Taxotere.   To help prevent nausea and vomiting after your treatment, we encourage you to take your nausea medication.   If you develop nausea and vomiting that is not controlled by your nausea medication, call the clinic.   BELOW ARE SYMPTOMS THAT SHOULD BE REPORTED IMMEDIATELY:  *FEVER GREATER THAN 100.5 F  *CHILLS WITH OR WITHOUT FEVER  NAUSEA AND VOMITING THAT IS NOT CONTROLLED WITH YOUR NAUSEA MEDICATION  *UNUSUAL SHORTNESS OF BREATH  *UNUSUAL BRUISING OR BLEEDING  TENDERNESS IN MOUTH AND THROAT WITH OR WITHOUT PRESENCE OF ULCERS  *URINARY PROBLEMS  *BOWEL PROBLEMS  UNUSUAL RASH Items with * indicate a potential emergency and should be followed up as soon as possible.  Feel free to call the clinic you have any questions or concerns. The clinic phone number is [phone].  Please show the CHEMO ALERT CARD at check-in to the Emergency Department and triage nurse.   

## 2016-05-31 NOTE — Telephone Encounter (Signed)
Per 1/23 LOS and staff message I have scheduled appts and gave calendar

## 2016-05-31 NOTE — Progress Notes (Signed)
Hematology and Oncology Follow Up Visit  Frank Larsen [id number] 1946/02/20 71 y.o. 05/31/2016 8:53 AM [name], [name], MD   Principle Diagnosis: 71 year old gentleman with prostate cancer diagnosed in 2013 after presenting with a PSA of 3.6 and a Gleason score 5+4 = 9. He has peritoneal carcinomatosis biopsy proven to be adenocarcinoma likely of a prostate primary.   Prior Therapy: He is status post robotic-assisted laparoscopic radical prostatectomy and extended lymphadenectomy with the pathology reveals T2N0.  completed in June 2013.  Current therapy: Taxotere chemotherapy at 75 mg/m every 3 weeks started on 03/30/2016. He is here for cycle 4 of therapy.  Interim History: [name] presents today for a follow-up visit. Since the last visit, he reports no recent complications. He denied any side effects associated with the last Taxotere chemotherapy. He denied any peripheral neuropathy. He denied any nausea, vomiting or change in his appetite. He has continued to gain weight and his quality of left improved dramatically. He did have grade 1 fatigue associated with Neulasta which have resolved at this time.   His pain has nearly resolved at this time and no longer reporting any major abdominal discomfort. He does not take hydrocodone but periodically would use aspirin if he has some passing discomfort. His bowels have been moving regularly and he is quality of life remained intact and back to baseline.   He does not report any headaches, blurry vision, syncope or seizures. He does not report any fevers, chills, sweats. He does not report any chest pain, palpitation, orthopnea or leg edema. He does not report any cough, wheezing or hemoptysis. He does not report any hematochezia, melena or diarrhea at this time. He has not moved his bowels last few days. He does not report any frequency, urgency or hematuria. He does not report any skeletal complaints. Remaining review of  systems and   Medications: I have reviewed the patient's current medications.  Current Outpatient Prescriptions  Medication Sig Dispense Refill  . atorvastatin (LIPITOR) 10 MG tablet Take 10 mg by mouth daily with breakfast.     . Blood Glucose Monitoring Suppl (ONE TOUCH ULTRA 2) w/Device KIT Use to check blood sugar 2 times per day. 1 each 2  . glipiZIDE (GLUCOTROL XL) 5 MG 24 hr tablet Take 1 tablet (5 mg total) by mouth daily with breakfast. 90 tablet 2  . glucose blood (ONE TOUCH ULTRA TEST) test strip Use to check blood sugar 2 times per day. 200 each 5  . HYDROcodone-acetaminophen (NORCO/VICODIN) 5-325 MG tablet Take 1-2 tablets by mouth every 6 (six) hours as needed for moderate pain. 20 tablet 0  . lidocaine-prilocaine (EMLA) cream Apply 1 application topically as needed. Apply to port before chemotherapy. 30 g 0  . metFORMIN (GLUCOPHAGE) 1000 MG tablet Take 1 tablet (1,000 mg total) by mouth 2 (two) times daily with a meal. 90 tablet 0  . Multiple Vitamin (MULTIVITAMIN) tablet Take 1 tablet by mouth daily.    [name] omeprazole (PRILOSEC) 20 MG capsule Take 20 mg by mouth every morning.    [name] DELICA LANCETS 53G MISC Use to check blood sugar 2 times per day. 200 each 2  . pioglitazone (ACTOS) 30 MG tablet Take 1 tablet (30 mg total) by mouth daily. 90 tablet 0  . prochlorperazine (COMPAZINE) 10 MG tablet Take 1 tablet (10 mg total) by mouth every 6 (six) hours as needed for nausea or vomiting. 30 tablet 0  . pseudoephedrine-acetaminophen (TYLENOL SINUS) 30-500 MG TABS tablet  Take 1 tablet by mouth every 4 (four) hours as needed (congestion).      No current facility-administered medications for this visit.      Allergies:  Allergies  Allergen Reactions  . Scallops [Shellfish Allergy] Nausea And Vomiting  . Cephalexin Hives and Rash    Past Medical History, Surgical history, Social history, and Family History were reviewed and updated.   Physical Exam: Blood pressure (!)  132/48, pulse 81, temperature 98.2 F (36.8 C), temperature source Oral, resp. rate 18, height 5' 10.5" (1.791 m), weight 189 lb 4.8 oz (85.9 kg), SpO2 99 %. ECOG: 1 General appearance: Well-appearing gentleman without distress. Head: Normocephalic, without obvious abnormality no oral ulcers or lesions. Neck: no adenopathy Lymph nodes: Cervical, supraclavicular, and axillary nodes normal. Heart:regular rate and rhythm, S1, S2 normal, no murmur, click, rub or gallop Lung:chest clear, no wheezing, rales, normal symmetric air entry Abdomin: soft, without masses or organomegaly. No shifting dullness or ascites. No splenomegaly. EXT:no erythema, induration, or nodules   Lab Results: Lab Results  Component Value Date   WBC 5.3 05/31/2016   HGB 9.2 (L) 05/31/2016   HCT 28.3 (L) 05/31/2016   MCV 86.6 05/31/2016   PLT 388 05/31/2016     Chemistry      Component Value Date/Time   NA 140 05/10/2016 0940   K 4.3 05/10/2016 0940   CL 102 03/05/2016 1459   CO2 23 05/10/2016 0940   BUN 15.9 05/10/2016 0940   CREATININE 0.9 05/10/2016 0940   GLU 155 08/24/2015      Component Value Date/Time   CALCIUM 9.3 05/10/2016 0940   ALKPHOS 52 05/10/2016 0940   AST 19 05/10/2016 0940   ALT 19 05/10/2016 0940   BILITOT 0.36 05/10/2016 0940     Results for [name], [name] (MRN [id number]) as of 05/31/2016 08:42  Ref. Range 04/19/2016 07:42 05/10/2016 09:40  PSA Latest Ref Range: 0.0 - 4.0 ng/mL 0.9 0.5     Impression and Plan:   71 year old gentleman with the following issues:  1. 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 This was noted after presenting with symptoms of nausea, diarrhea and vomiting. He reports poor by mouth intake and weight loss. He does have history of prostate cancer Gleason score 5+4 = 9 resected in 2013.  He is status post biopsy obtained on 03/14/2016 and the final pathology showed metastatic carcinoma that is poorly differentiated that favors  prostate cancer. Although the specimen did not stain for PSA his tumor is poorly differentiated likely secretes very little to no PSA. His last PSA was 1.3.  He is currently receiving Taxotere salvage chemotherapy started on 03/30/2016. He continues to have excellent clinical response to chemotherapy including weight gain and decrease in his abdominal pain. He believes he is back to baseline at this time. His PSA of also continued to decline.  The plan is to continue with the same dose schedule of chemotherapy and repeat imaging studies after cycle 5. I anticipate 10 cycles of chemotherapy depending on his response.  2. Abdominal pain: Much improved at this time and periodically takes aspirin as needed.  3. Antiemetics: Prescription for Compazine was made available to the patient. No issues associated with that needed.  4. IV access: Port-A-Cath placed and reports no recent complications.  5. Neutropenia prophylaxis: He will receive Neulasta after each injection.  6. Follow-up: Will be in 3 weeks for cycle 5 of chemotherapy.    [facility], MD 1/23/20188:53 AM

## 2016-06-01 LAB — PSA: Prostate Specific Ag, Serum: 0.2 ng/mL (ref 0.0–4.0)

## 2016-06-02 ENCOUNTER — Ambulatory Visit (HOSPITAL_BASED_OUTPATIENT_CLINIC_OR_DEPARTMENT_OTHER): Payer: PPO

## 2016-06-02 VITALS — BP 152/65 | HR 80 | Temp 97.5°F | Resp 20

## 2016-06-02 DIAGNOSIS — Z5189 Encounter for other specified aftercare: Secondary | ICD-10-CM | POA: Diagnosis not present

## 2016-06-02 DIAGNOSIS — C61 Malignant neoplasm of prostate: Secondary | ICD-10-CM | POA: Diagnosis not present

## 2016-06-02 MED ORDER — PEGFILGRASTIM INJECTION 6 MG/0.6ML ~~LOC~~
6.0000 mg | PREFILLED_SYRINGE | Freq: Once | SUBCUTANEOUS | Status: AC
  Administered 2016-06-02: 6 mg via SUBCUTANEOUS
  Filled 2016-06-02: qty 0.6

## 2016-06-02 NOTE — Patient Instructions (Signed)
Pegfilgrastim injection What is this medicine? PEGFILGRASTIM (PEG fil gra stim) is a long-acting granulocyte colony-stimulating factor that stimulates the growth of neutrophils, a type of white blood cell important in the body's fight against infection. It is used to reduce the incidence of fever and infection in patients with certain types of cancer who are receiving chemotherapy that affects the bone marrow, and to increase survival after being exposed to high doses of radiation. This medicine may be used for other purposes; ask your health care provider or pharmacist if you have questions. COMMON BRAND NAME(S): Neulasta What should I tell my health care provider before I take this medicine? They need to know if you have any of these conditions: -kidney disease -latex allergy -ongoing radiation therapy -sickle cell disease -skin reactions to acrylic adhesives (On-Body Injector only) -an unusual or allergic reaction to pegfilgrastim, filgrastim, other medicines, foods, dyes, or preservatives -pregnant or trying to get pregnant -breast-feeding How should I use this medicine? This medicine is for injection under the skin. If you get this medicine at home, you will be taught how to prepare and give the pre-filled syringe or how to use the On-body Injector. Refer to the patient Instructions for Use for detailed instructions. Use exactly as directed. Take your medicine at regular intervals. Do not take your medicine more often than directed. It is important that you put your used needles and syringes in a special sharps container. Do not put them in a trash can. If you do not have a sharps container, call your pharmacist or healthcare provider to get one. Talk to your pediatrician regarding the use of this medicine in children. While this drug may be prescribed for selected conditions, precautions do apply. Overdosage: If you think you have taken too much of this medicine contact a poison control  center or emergency room at once. NOTE: This medicine is only for you. Do not share this medicine with others. What if I miss a dose? It is important not to miss your dose. Call your doctor or health care professional if you miss your dose. If you miss a dose due to an On-body Injector failure or leakage, a new dose should be administered as soon as possible using a single prefilled syringe for manual use. What may interact with this medicine? Interactions have not been studied. Give your health care provider a list of all the medicines, herbs, non-prescription drugs, or dietary supplements you use. Also tell them if you smoke, drink alcohol, or use illegal drugs. Some items may interact with your medicine. This list may not describe all possible interactions. Give your health care provider a list of all the medicines, herbs, non-prescription drugs, or dietary supplements you use. Also tell them if you smoke, drink alcohol, or use illegal drugs. Some items may interact with your medicine. What should I watch for while using this medicine? You may need blood work done while you are taking this medicine. If you are going to need a MRI, CT scan, or other procedure, tell your doctor that you are using this medicine (On-Body Injector only). What side effects may I notice from receiving this medicine? Side effects that you should report to your doctor or health care professional as soon as possible: -allergic reactions like skin rash, itching or hives, swelling of the face, lips, or tongue -dizziness -fever -pain, redness, or irritation at site where injected -pinpoint red spots on the skin -red or dark-brown urine -shortness of breath or breathing problems -stomach or   side pain, or pain at the shoulder -swelling -tiredness -trouble passing urine or change in the amount of urine Side effects that usually do not require medical attention (report to your doctor or health care professional if they  continue or are bothersome): -bone pain -muscle pain This list may not describe all possible side effects. Call your doctor for medical advice about side effects. You may report side effects to FDA at 1-800-FDA-1088. Where should I keep my medicine? Keep out of the reach of children. Store pre-filled syringes in a refrigerator between 2 and 8 degrees C (36 and 46 degrees F). Do not freeze. Keep in carton to protect from light. Throw away this medicine if it is left out of the refrigerator for more than 48 hours. Throw away any unused medicine after the expiration date. NOTE: This sheet is a summary. It may not cover all possible information. If you have questions about this medicine, talk to your doctor, pharmacist, or health care provider.  2017 Elsevier/Gold Standard (2014-05-15 14:30:14)  

## 2016-06-14 ENCOUNTER — Encounter: Payer: Self-pay | Admitting: *Deleted

## 2016-06-21 ENCOUNTER — Other Ambulatory Visit (HOSPITAL_BASED_OUTPATIENT_CLINIC_OR_DEPARTMENT_OTHER): Payer: PPO

## 2016-06-21 ENCOUNTER — Ambulatory Visit (HOSPITAL_BASED_OUTPATIENT_CLINIC_OR_DEPARTMENT_OTHER): Payer: PPO

## 2016-06-21 ENCOUNTER — Ambulatory Visit: Payer: PPO

## 2016-06-21 ENCOUNTER — Telehealth: Payer: Self-pay | Admitting: *Deleted

## 2016-06-21 ENCOUNTER — Telehealth: Payer: Self-pay | Admitting: Oncology

## 2016-06-21 ENCOUNTER — Ambulatory Visit (HOSPITAL_BASED_OUTPATIENT_CLINIC_OR_DEPARTMENT_OTHER): Payer: PPO | Admitting: Oncology

## 2016-06-21 VITALS — BP 114/58 | HR 86 | Temp 98.3°F | Resp 17 | Ht 70.5 in | Wt 190.3 lb

## 2016-06-21 DIAGNOSIS — C61 Malignant neoplasm of prostate: Secondary | ICD-10-CM

## 2016-06-21 DIAGNOSIS — R109 Unspecified abdominal pain: Secondary | ICD-10-CM

## 2016-06-21 DIAGNOSIS — Z95828 Presence of other vascular implants and grafts: Secondary | ICD-10-CM

## 2016-06-21 DIAGNOSIS — R634 Abnormal weight loss: Secondary | ICD-10-CM | POA: Diagnosis not present

## 2016-06-21 DIAGNOSIS — C786 Secondary malignant neoplasm of retroperitoneum and peritoneum: Secondary | ICD-10-CM

## 2016-06-21 DIAGNOSIS — Z5111 Encounter for antineoplastic chemotherapy: Secondary | ICD-10-CM | POA: Diagnosis not present

## 2016-06-21 LAB — CBC WITH DIFFERENTIAL/PLATELET
BASO%: 1.9 % (ref 0.0–2.0)
BASOS ABS: 0.1 10*3/uL (ref 0.0–0.1)
EOS ABS: 0 10*3/uL (ref 0.0–0.5)
EOS%: 0.4 % (ref 0.0–7.0)
HEMATOCRIT: 29.9 % — AB (ref 38.4–49.9)
HEMOGLOBIN: 9.6 g/dL — AB (ref 13.0–17.1)
LYMPH#: 0.6 10*3/uL — AB (ref 0.9–3.3)
LYMPH%: 9.6 % — ABNORMAL LOW (ref 14.0–49.0)
MCH: 27.9 pg (ref 27.2–33.4)
MCHC: 32 g/dL (ref 32.0–36.0)
MCV: 87.1 fL (ref 79.3–98.0)
MONO#: 0.8 10*3/uL (ref 0.1–0.9)
MONO%: 12.1 % (ref 0.0–14.0)
NEUT#: 5.1 10*3/uL (ref 1.5–6.5)
NEUT%: 76 % — AB (ref 39.0–75.0)
Platelets: 462 10*3/uL — ABNORMAL HIGH (ref 140–400)
RBC: 3.43 10*6/uL — ABNORMAL LOW (ref 4.20–5.82)
RDW: 19.9 % — AB (ref 11.0–14.6)
WBC: 6.7 10*3/uL (ref 4.0–10.3)

## 2016-06-21 LAB — COMPREHENSIVE METABOLIC PANEL
ALBUMIN: 3.5 g/dL (ref 3.5–5.0)
ALK PHOS: 45 U/L (ref 40–150)
ALT: 17 U/L (ref 0–55)
AST: 19 U/L (ref 5–34)
Anion Gap: 10 mEq/L (ref 3–11)
BUN: 18.5 mg/dL (ref 7.0–26.0)
CALCIUM: 9.3 mg/dL (ref 8.4–10.4)
CHLORIDE: 108 meq/L (ref 98–109)
CO2: 23 mEq/L (ref 22–29)
Creatinine: 0.9 mg/dL (ref 0.7–1.3)
EGFR: 87 mL/min/{1.73_m2} — ABNORMAL LOW (ref >90)
Glucose: 181 mg/dl — ABNORMAL HIGH (ref 70–140)
POTASSIUM: 4.2 meq/L (ref 3.5–5.1)
Sodium: 141 mEq/L (ref 136–145)
Total Bilirubin: 0.39 mg/dL (ref 0.20–1.20)
Total Protein: 6.3 g/dL — ABNORMAL LOW (ref 6.4–8.3)

## 2016-06-21 MED ORDER — DOCETAXEL CHEMO INJECTION 160 MG/16ML
75.0000 mg/m2 | Freq: Once | INTRAVENOUS | Status: AC
  Administered 2016-06-21: 150 mg via INTRAVENOUS
  Filled 2016-06-21: qty 15

## 2016-06-21 MED ORDER — DEXAMETHASONE SODIUM PHOSPHATE 10 MG/ML IJ SOLN
10.0000 mg | Freq: Once | INTRAMUSCULAR | Status: AC
  Administered 2016-06-21: 10 mg via INTRAVENOUS

## 2016-06-21 MED ORDER — SODIUM CHLORIDE 0.9% FLUSH
10.0000 mL | INTRAVENOUS | Status: DC | PRN
  Administered 2016-06-21: 10 mL
  Filled 2016-06-21: qty 10

## 2016-06-21 MED ORDER — HEPARIN SOD (PORK) LOCK FLUSH 100 UNIT/ML IV SOLN
500.0000 [IU] | Freq: Once | INTRAVENOUS | Status: AC | PRN
  Administered 2016-06-21: 500 [IU]
  Filled 2016-06-21: qty 5

## 2016-06-21 MED ORDER — SODIUM CHLORIDE 0.9 % IV SOLN
Freq: Once | INTRAVENOUS | Status: AC
  Administered 2016-06-21: 09:00:00 via INTRAVENOUS

## 2016-06-21 MED ORDER — DEXAMETHASONE SODIUM PHOSPHATE 10 MG/ML IJ SOLN
INTRAMUSCULAR | Status: AC
  Filled 2016-06-21: qty 1

## 2016-06-21 MED ORDER — SODIUM CHLORIDE 0.9% FLUSH
10.0000 mL | INTRAVENOUS | Status: DC | PRN
  Administered 2016-06-21: 10 mL via INTRAVENOUS
  Filled 2016-06-21: qty 10

## 2016-06-21 NOTE — Telephone Encounter (Signed)
Per 2/13 LOS and staff message I have scheduled appts. Patient to receive AVS at end of treatment with new appts

## 2016-06-21 NOTE — Progress Notes (Signed)
Hematology and Oncology Follow Up Visit  Frank Larsen [id number] 07/29/1945 71 y.o. 06/21/2016 8:20 AM Frank Larsen, Frank Brow, MD   Principle Diagnosis: 71 year old gentleman with prostate cancer diagnosed in 2013 after presenting with a PSA of 3.6 and a Gleason score 5+4 = 9. He has peritoneal carcinomatosis biopsy proven to be adenocarcinoma likely of a prostate primary.   Prior Therapy: He is status post robotic-assisted laparoscopic radical prostatectomy and extended lymphadenectomy with the pathology reveals T2N0.  completed in June 2013.  Current therapy: Taxotere chemotherapy at 75 mg/m every 3 weeks started on 03/30/2016. He is here for cycle 5 of therapy.  Interim History: Frank Larsen presents today for a follow-up visit. Since the last visit, he reports doing well. He tolerated the last cycle of chemotherapy without complications.  He denied any peripheral neuropathy. He denied any nausea, vomiting or change in his appetite. He has continued to gain weight and his abdominal pain has resolved. He continues to have grade 1 fatigue associated with Neulasta which have resolved at this time.   He does not report any headaches, blurry vision, syncope or seizures. He does not report any fevers, chills, sweats. He does not report any chest pain, palpitation, orthopnea or leg edema. He does not report any cough, wheezing or hemoptysis. He does not report any hematochezia, melena or diarrhea at this time. He has not moved his bowels last few days. He does not report any frequency, urgency or hematuria. He does not report any skeletal complaints. Remaining review of systems and   Medications: I have reviewed the patient's current medications.  Current Outpatient Prescriptions  Medication Sig Dispense Refill  . atorvastatin (LIPITOR) 10 MG tablet Take 10 mg by mouth daily with breakfast.     . Blood Glucose Monitoring Suppl (ONE TOUCH ULTRA 2) w/Device KIT Use to check blood sugar 2  times per day. 1 each 2  . glipiZIDE (GLUCOTROL XL) 5 MG 24 hr tablet Take 1 tablet (5 mg total) by mouth daily with breakfast. 90 tablet 2  . glucose blood (ONE TOUCH ULTRA TEST) test strip Use to check blood sugar 2 times per day. 200 each 5  . HYDROcodone-acetaminophen (NORCO/VICODIN) 5-325 MG tablet Take 1-2 tablets by mouth every 6 (six) hours as needed for moderate pain. 20 tablet 0  . lidocaine-prilocaine (EMLA) cream Apply 1 application topically as needed. Apply to port before chemotherapy. 30 g 0  . metFORMIN (GLUCOPHAGE) 1000 MG tablet Take 1 tablet (1,000 mg total) by mouth 2 (two) times daily with a meal. 90 tablet 0  . Multiple Vitamin (MULTIVITAMIN) tablet Take 1 tablet by mouth daily.    [name] omeprazole (PRILOSEC) 20 MG capsule Take 20 mg by mouth every morning.    [name] DELICA LANCETS 67H MISC Use to check blood sugar 2 times per day. 200 each 2  . pioglitazone (ACTOS) 30 MG tablet Take 1 tablet (30 mg total) by mouth daily. 90 tablet 0  . prochlorperazine (COMPAZINE) 10 MG tablet Take 1 tablet (10 mg total) by mouth every 6 (six) hours as needed for nausea or vomiting. 30 tablet 0  . pseudoephedrine-acetaminophen (TYLENOL SINUS) 30-500 MG TABS tablet Take 1 tablet by mouth every 4 (four) hours as needed (congestion).      No current facility-administered medications for this visit.      Allergies:  Allergies  Allergen Reactions  . Scallops [Shellfish Allergy] Nausea And Vomiting  . Cephalexin Hives and Rash    Past Medical  History, Surgical history, Social history, and Family History were reviewed and updated.   Physical Exam: Blood pressure (!) 114/58, pulse 86, temperature 98.3 F (36.8 C), temperature source Oral, resp. rate 17, height 5' 10.5" (1.791 m), weight 190 lb 4.8 oz (86.3 kg), SpO2 100 %. ECOG: 1 General appearance: alert, awake without distress.  Head: Normocephalic, without obvious abnormality no oral ulcers or lesions. Neck: no adenopathy Lymph  nodes: Cervical, supraclavicular, and axillary nodes normal. Heart:regular rate and rhythm, S1, S2 normal, no murmur, click, rub or gallop Lung:chest clear, no wheezing, rales, normal symmetric air entry Abdomin: soft, without masses or organomegaly. No rebound or guarding. EXT:no erythema, induration, or nodules   Lab Results: Lab Results  Component Value Date   WBC 6.7 06/21/2016   HGB 9.6 (L) 06/21/2016   HCT 29.9 (L) 06/21/2016   MCV 87.1 06/21/2016   PLT 462 (H) 06/21/2016     Chemistry      Component Value Date/Time   NA 139 05/31/2016 0814   K 4.4 05/31/2016 0814   CL 102 03/05/2016 1459   CO2 22 05/31/2016 0814   BUN 13.7 05/31/2016 0814   CREATININE 0.8 05/31/2016 0814   GLU 155 08/24/2015      Component Value Date/Time   CALCIUM 9.1 05/31/2016 0814   ALKPHOS 49 05/31/2016 0814   AST 20 05/31/2016 0814   ALT 17 05/31/2016 0814   BILITOT 0.36 05/31/2016 0814      Results for Frank Larsen, Frank Larsen (MRN [id number]) as of 06/21/2016 07:58  Ref. Range 05/10/2016 09:40 05/31/2016 08:14  PSA Latest Ref Range: 0.0 - 4.0 ng/mL 0.5 0.2     Impression and Plan:   72 year old gentleman with the following issues:  1. 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 This was noted after presenting with symptoms of nausea, diarrhea and vomiting. He reports poor by mouth intake and weight loss. He does have history of prostate cancer Gleason score 5+4 = 9 resected in 2013.  He is status post biopsy obtained on 03/14/2016 and the final pathology showed metastatic carcinoma that is poorly differentiated that favors prostate cancer. Although the specimen did not stain for PSA his tumor is poorly differentiated likely secretes very little to no PSA. His last PSA was 1.3.  He is currently receiving Taxotere salvage chemotherapy started on 03/30/2016.   He continues to responsed to chemotherapy without complications. The plan is to continue with the same dose schedule of  chemotherapy and repeat imaging studies on 06/28/2016. I anticipate close to 10 cycles of therapy.   2. Abdominal pain: Much improved at this time and no longer taking pain medications.   3. Antiemetics: Prescription for Compazine was made available to the patient. None reported at this time.   4. IV access: Port-A-Cath placed and reports no recent complications.  5. Neutropenia prophylaxis: He will receive Neulasta after each injection.  6. Follow-up: Will be in 3 weeks for cycle 6 of chemotherapy.    [facility], MD 2/13/20188:20 AM

## 2016-06-21 NOTE — Telephone Encounter (Signed)
Appointments scheduled per 2/13 LOS. Patient given AVS report and calendars with future scheduled appointments. °

## 2016-06-21 NOTE — Patient Instructions (Signed)
[facility] Discharge Instructions for Patients Receiving Chemotherapy  Today you received the following chemotherapy agents Taxotere.   To help prevent nausea and vomiting after your treatment, we encourage you to take your nausea medication.   If you develop nausea and vomiting that is not controlled by your nausea medication, call the clinic.   BELOW ARE SYMPTOMS THAT SHOULD BE REPORTED IMMEDIATELY:  *FEVER GREATER THAN 100.5 F  *CHILLS WITH OR WITHOUT FEVER  NAUSEA AND VOMITING THAT IS NOT CONTROLLED WITH YOUR NAUSEA MEDICATION  *UNUSUAL SHORTNESS OF BREATH  *UNUSUAL BRUISING OR BLEEDING  TENDERNESS IN MOUTH AND THROAT WITH OR WITHOUT PRESENCE OF ULCERS  *URINARY PROBLEMS  *BOWEL PROBLEMS  UNUSUAL RASH Items with * indicate a potential emergency and should be followed up as soon as possible.  Feel free to call the clinic you have any questions or concerns. The clinic phone number is [phone].  Please show the CHEMO ALERT CARD at check-in to the Emergency Department and triage nurse.   

## 2016-06-21 NOTE — Patient Instructions (Signed)

## 2016-06-22 ENCOUNTER — Telehealth: Payer: Self-pay | Admitting: *Deleted

## 2016-06-22 LAB — PSA: Prostate Specific Ag, Serum: <0.1 ng/mL (ref 0.0–4.0)

## 2016-06-22 NOTE — Telephone Encounter (Signed)
-----   Message from Wyatt Portela, MD sent at 06/22/2016  8:44 AM EST ----- Please let him know his PSA

## 2016-06-22 NOTE — Telephone Encounter (Signed)
As noted below by Dr. Alen Blew, I left a message with informing him of his PSA result. Instructed him to call [facility] if he had any questions or concerns.

## 2016-06-23 ENCOUNTER — Ambulatory Visit (HOSPITAL_BASED_OUTPATIENT_CLINIC_OR_DEPARTMENT_OTHER): Payer: PPO

## 2016-06-23 VITALS — BP 132/58 | HR 80 | Temp 98.5°F | Resp 20

## 2016-06-23 DIAGNOSIS — C61 Malignant neoplasm of prostate: Secondary | ICD-10-CM | POA: Diagnosis not present

## 2016-06-23 DIAGNOSIS — Z5189 Encounter for other specified aftercare: Secondary | ICD-10-CM | POA: Diagnosis not present

## 2016-06-23 MED ORDER — PEGFILGRASTIM INJECTION 6 MG/0.6ML ~~LOC~~
6.0000 mg | PREFILLED_SYRINGE | Freq: Once | SUBCUTANEOUS | Status: AC
  Administered 2016-06-23: 6 mg via SUBCUTANEOUS
  Filled 2016-06-23: qty 0.6

## 2016-06-23 NOTE — Patient Instructions (Signed)
Pegfilgrastim injection What is this medicine? PEGFILGRASTIM (PEG fil gra stim) is a long-acting granulocyte colony-stimulating factor that stimulates the growth of neutrophils, a type of white blood cell important in the body's fight against infection. It is used to reduce the incidence of fever and infection in patients with certain types of cancer who are receiving chemotherapy that affects the bone marrow, and to increase survival after being exposed to high doses of radiation. This medicine may be used for other purposes; ask your health care provider or pharmacist if you have questions. COMMON BRAND NAME(S): Neulasta What should I tell my health care provider before I take this medicine? They need to know if you have any of these conditions: -kidney disease -latex allergy -ongoing radiation therapy -sickle cell disease -skin reactions to acrylic adhesives (On-Body Injector only) -an unusual or allergic reaction to pegfilgrastim, filgrastim, other medicines, foods, dyes, or preservatives -pregnant or trying to get pregnant -breast-feeding How should I use this medicine? This medicine is for injection under the skin. If you get this medicine at home, you will be taught how to prepare and give the pre-filled syringe or how to use the On-body Injector. Refer to the patient Instructions for Use for detailed instructions. Use exactly as directed. Take your medicine at regular intervals. Do not take your medicine more often than directed. It is important that you put your used needles and syringes in a special sharps container. Do not put them in a trash can. If you do not have a sharps container, call your pharmacist or healthcare provider to get one. Talk to your pediatrician regarding the use of this medicine in children. While this drug may be prescribed for selected conditions, precautions do apply. Overdosage: If you think you have taken too much of this medicine contact a poison control  center or emergency room at once. NOTE: This medicine is only for you. Do not share this medicine with others. What if I miss a dose? It is important not to miss your dose. Call your doctor or health care professional if you miss your dose. If you miss a dose due to an On-body Injector failure or leakage, a new dose should be administered as soon as possible using a single prefilled syringe for manual use. What may interact with this medicine? Interactions have not been studied. Give your health care provider a list of all the medicines, herbs, non-prescription drugs, or dietary supplements you use. Also tell them if you smoke, drink alcohol, or use illegal drugs. Some items may interact with your medicine. This list may not describe all possible interactions. Give your health care provider a list of all the medicines, herbs, non-prescription drugs, or dietary supplements you use. Also tell them if you smoke, drink alcohol, or use illegal drugs. Some items may interact with your medicine. What should I watch for while using this medicine? You may need blood work done while you are taking this medicine. If you are going to need a MRI, CT scan, or other procedure, tell your doctor that you are using this medicine (On-Body Injector only). What side effects may I notice from receiving this medicine? Side effects that you should report to your doctor or health care professional as soon as possible: -allergic reactions like skin rash, itching or hives, swelling of the face, lips, or tongue -dizziness -fever -pain, redness, or irritation at site where injected -pinpoint red spots on the skin -red or dark-brown urine -shortness of breath or breathing problems -stomach or   side pain, or pain at the shoulder -swelling -tiredness -trouble passing urine or change in the amount of urine Side effects that usually do not require medical attention (report to your doctor or health care professional if they  continue or are bothersome): -bone pain -muscle pain This list may not describe all possible side effects. Call your doctor for medical advice about side effects. You may report side effects to FDA at 1-800-FDA-1088. Where should I keep my medicine? Keep out of the reach of children. Store pre-filled syringes in a refrigerator between 2 and 8 degrees C (36 and 46 degrees F). Do not freeze. Keep in carton to protect from light. Throw away this medicine if it is left out of the refrigerator for more than 48 hours. Throw away any unused medicine after the expiration date. NOTE: This sheet is a summary. It may not cover all possible information. If you have questions about this medicine, talk to your doctor, pharmacist, or health care provider.  2017 Elsevier/Gold Standard (2014-05-15 14:30:14)  

## 2016-06-28 ENCOUNTER — Encounter (HOSPITAL_COMMUNITY): Payer: Self-pay

## 2016-06-28 ENCOUNTER — Ambulatory Visit (HOSPITAL_COMMUNITY)
Admission: RE | Admit: 2016-06-28 | Discharge: 2016-06-28 | Disposition: A | Discharge status: Home / Self Care | Payer: PPO | Source: Ambulatory Visit | Attending: Oncology | Admitting: Oncology

## 2016-06-28 DIAGNOSIS — D225 Melanocytic nevi of trunk: Secondary | ICD-10-CM | POA: Diagnosis not present

## 2016-06-28 DIAGNOSIS — C61 Malignant neoplasm of prostate: Secondary | ICD-10-CM | POA: Diagnosis not present

## 2016-06-28 DIAGNOSIS — D485 Neoplasm of uncertain behavior of skin: Secondary | ICD-10-CM | POA: Diagnosis not present

## 2016-06-28 DIAGNOSIS — L309 Dermatitis, unspecified: Secondary | ICD-10-CM | POA: Diagnosis not present

## 2016-06-28 DIAGNOSIS — L570 Actinic keratosis: Secondary | ICD-10-CM | POA: Diagnosis not present

## 2016-06-28 DIAGNOSIS — D1800 Hemangioma unspecified site: Secondary | ICD-10-CM | POA: Diagnosis not present

## 2016-06-28 DIAGNOSIS — Z85820 Personal history of malignant melanoma of skin: Secondary | ICD-10-CM | POA: Diagnosis not present

## 2016-06-28 DIAGNOSIS — L821 Other seborrheic keratosis: Secondary | ICD-10-CM | POA: Diagnosis not present

## 2016-06-28 DIAGNOSIS — D2271 Melanocytic nevi of right lower limb, including hip: Secondary | ICD-10-CM | POA: Diagnosis not present

## 2016-06-28 DIAGNOSIS — L814 Other melanin hyperpigmentation: Secondary | ICD-10-CM | POA: Diagnosis not present

## 2016-06-28 MED ORDER — IOPAMIDOL (ISOVUE-300) INJECTION 61%
INTRAVENOUS | Status: AC
  Administered 2016-06-28: 100 mL
  Filled 2016-06-28: qty 100

## 2016-06-28 MED ORDER — HEPARIN SOD (PORK) LOCK FLUSH 100 UNIT/ML IV SOLN
INTRAVENOUS | Status: AC
  Administered 2016-06-28: 500 [IU]
  Filled 2016-06-28: qty 5

## 2016-06-28 MED ORDER — SODIUM CHLORIDE 0.9 % IJ SOLN
INTRAMUSCULAR | Status: AC
  Filled 2016-06-28: qty 50

## 2016-06-30 ENCOUNTER — Encounter: Payer: Self-pay | Admitting: Internal Medicine

## 2016-06-30 ENCOUNTER — Telehealth: Payer: Self-pay | Admitting: Internal Medicine

## 2016-06-30 ENCOUNTER — Ambulatory Visit (INDEPENDENT_AMBULATORY_CARE_PROVIDER_SITE_OTHER): Payer: PPO | Admitting: Internal Medicine

## 2016-06-30 ENCOUNTER — Telehealth: Payer: Self-pay

## 2016-06-30 VITALS — BP 120/74 | HR 80 | Ht 70.0 in | Wt 187.0 lb

## 2016-06-30 DIAGNOSIS — E1165 Type 2 diabetes mellitus with hyperglycemia: Secondary | ICD-10-CM

## 2016-06-30 DIAGNOSIS — E1142 Type 2 diabetes mellitus with diabetic polyneuropathy: Secondary | ICD-10-CM | POA: Diagnosis not present

## 2016-06-30 LAB — POCT GLYCOSYLATED HEMOGLOBIN (HGB A1C): Hemoglobin A1C: 6.6

## 2016-06-30 MED ORDER — PIOGLITAZONE HCL 30 MG PO TABS: 30.0000 mg | ORAL_TABLET | Freq: Every day | ORAL | 3 refills | Status: DC

## 2016-06-30 MED ORDER — METFORMIN HCL 1000 MG PO TABS: 1000.0000 mg | ORAL_TABLET | Freq: Two times a day (BID) | ORAL | 3 refills | Status: DC

## 2016-06-30 NOTE — Telephone Encounter (Signed)
Calling pharmacy to figure out what is going on with his medications., and why we are not receiving any faxes and how they have the wrong number.   Called [facility], advised that we needed to change number for our office in his chart. They did that. We should receive patient refill request now.

## 2016-06-30 NOTE — Progress Notes (Signed)
Patient ID: Frank Larsen, male   DOB: [birth year], 71 y.o.   MRN: [id number]  HPI: Frank Larsen is a 71 y.o.-year-old male, returning for f/u for DM2, dx in 2013, non-insulin-dependent, uncontrolled, with complications (PN). He saw Dr. Buddy Duty in the past. Last visit with me 2 mo ago.  He has a h/o omental Pr CA metastases (previously dx'ed in 2013). On ChTx - getting Dexamethasone with the txs. Prev. RxTx in 2015. Oncologist: Dr. Alen Blew.   He tolerates ChTx well, but Neulasta makes him feel fatigued and weak. He also has no taste.  He had some high sugars 300-400, now improved.  Last hemoglobin A1c was: Lab Results  Component Value Date   HGBA1C 6.5 03/30/2016   HGBA1C 6.8 12/29/2015   HGBA1C 8.2 08/24/2015  02/17/2015: 7.7% 10/2014: 7.6%  Pt is on a regimen of: - Metformin 1000 mg 2x a day, with meals - Actos 30 mg daily in a.m. - added in 2016 - Glipizide ER 5 mg daily in am, before b'fast - added 10/2015 Ran out of Januvia 100 mg daily in a.m. >> not covered until the end of the year (doughnut hole)  Pt started to check sugars at home: 2x a day.  - am: n/c >> (after coffee) 142-172, 192 >> 106-164, 198, some 200s 2/2 pain >> 157-194, 216 - 2h after b'fast: n/c >> 119-155 >> n/c >> 181, 184 - before lunch: n/c >> 127-152 >> 114-151, 220 >> 174, 255 - 2h after lunch: n/c >> 127-167 >> 127, 144 >> 149, 253 - before dinner: n/c >> 96-142 >> 114, 205 (pain) >> 112-144 - 2h after dinner: n/c >> 98, 121-181 >> 85, 99 >> 171-198 - bedtime: n/c - nighttime: n/c No lows. Lowest sugar was 119 >> 96 >> 70 (took Glipizide and did not eat) >> 112; he has hypoglycemia awareness at 70.  Highest sugar was 204 >> 224 >> 200s >> 253  Glucometer: AccuChek - old >> given an AccuChek Guide (not covered) >> One Touch Ultra  Pt's meals are: - Breakfast: eggs, toast + PB, pancakes + waffles - Lunch: sandwich - Dinner: chicken + veggies + starch or salad - Snacks: popcorn, pretzels, cookies,  chocolate  - no CKD, last BUN/creatinine:  Lab Results  Component Value Date   BUN 18.5 06/21/2016   BUN 13.7 05/31/2016   CREATININE 0.9 06/21/2016   CREATININE 0.8 05/31/2016  08/24/2015: 14/1.01, EGFR 73 He had increased microalbumin in the past and started losartan on 02/17/2015. - last set of lipids: 08/24/2015: 141/134/41/74  He is on atorvastatin. - last eye exam was 06/02/2015. No DR.  - no numbness, but has tingling in his feet. + foot exam in 08/2015 >> normal sensation to manofilament  He also has a h/o Melanoma. He also has HTN, HL.  He will go in 2 cruises in the next year.  ROS: Constitutional: + weight loss and now gain, no  fatigue, no subjective hyperthermia/hypothermia Eyes: no blurry vision, no xerophthalmia ENT: no sore throat, no nodules palpated in throat, no dysphagia/odynophagia, no hoarseness Cardiovascular: no CP/SOB/palpitations/leg swelling Respiratory: no cough/SOB Gastrointestinal: no N/V/D/C Musculoskeletal: no muscle/joint aches Skin: no rashes Neurological: no tremors/numbness/tingling/dizziness  I reviewed pt's medications, allergies, PMH, social hx, family hx, and changes were documented in the history of present illness. Otherwise, unchanged from my initial visit note.  Past Medical History:  Diagnosis Date  . AK (actinic keratosis)   . Arthritis   . BPH (benign prostatic hyperplasia)   .  Chronic low back pain    since 1979  . Diabetes mellitus ORAL MED   type 2  . Elevated PSA   . GERD (gastroesophageal reflux disease)   . Hearing loss    using bilateral hearing aids  . History of radiation therapy 01/11/14- 03/04/14   prostate bed 6600 cGy 33 sessions  . Hypercholesterolemia   . Hypercholesterolemia   . Hyperlipidemia   . Impaired hearing BILATERAL HEARING AIDS   20% RIGHT HEARING DUE TO VIRUS  . Melanoma ([place])   . Normal cardiac stress test 2010  . Prostate cancer ([place]) 07/20/11   Gleason 9  . Prostate cancer ([place])   .  Prostate nodule   . Skin cancer    malignant melanoma of skin   . Tinnitus    stable since 1992  . Tobacco use    Past Surgical History:  Procedure Laterality Date  . APPENDECTOMY  AGE 11  . CARPOMETACARPAL JOINT ARTHROTOMY  03-22-2006   RIGHT THUMB  . CARPOMETACARPAL JOINT ARTHROTOMY  12-21-2005   LEFT THUMB  . IR GENERIC HISTORICAL  03/22/2016   IR US GUIDE VASC ACCESS RIGHT [facility]  . IR GENERIC HISTORICAL  03/22/2016   IR FLUORO GUIDE PORT INSERTION RIGHT [facility]  . MELANOMA EXCISION  07-12-10   RIGHT NECK  . PROSTATE BIOPSY  07/20/2011   Procedure: BIOPSY TRANSRECTAL ULTRASONIC PROSTATE (TUBP);  Surgeon: Molli Hazard, MD;  Location: [facility];  Service: Urology;  Laterality: N/A;  1 hour requested for this case  Saturation BX  OFFICE TO BRING ULTRASOUND MACHINE    . ROBOT ASSISTED LAPAROSCOPIC RADICAL PROSTATECTOMY  10/19/2011   Procedure: ROBOTIC ASSISTED LAPAROSCOPIC RADICAL PROSTATECTOMY;  Surgeon: Molli Hazard, MD;  Location: [facility];  Service: Urology;  Laterality: N/A;      . SHOULDER OPEN ROTATOR CUFF REPAIR  07-29-2010- RIGHT   AND SUBACROMIAL DECOMPRESSION AROMIOPLASTY  . TONSILLECTOMY  AGE 7   Social History   Social History  . Marital Status: Married    Spouse Name: N/A  . Number of Children: 2   Occupational History  . Retired     Public relations account executive   Social History Main Topics  . Smoking status: Former Smoker -- 1.00 packs/day for 43 years    Types: Cigarettes    Quit date: 12/07/2007  . Smokeless tobacco: Never Used  . Alcohol Use: Yes     Comment: OCCASIONAL  . Drug Use: No   Current Outpatient Prescriptions on File Prior to Visit  Medication Sig Dispense Refill  . atorvastatin (LIPITOR) 10 MG tablet Take 10 mg by mouth daily with breakfast.     . Blood Glucose Monitoring Suppl (ONE TOUCH ULTRA 2) w/Device KIT Use to check blood sugar 2 times per day. 1 each 2  . glipiZIDE (GLUCOTROL XL) 5 MG 24  hr tablet Take 1 tablet (5 mg total) by mouth daily with breakfast. 90 tablet 2  . glucose blood (ONE TOUCH ULTRA TEST) test strip Use to check blood sugar 2 times per day. 200 each 5  . HYDROcodone-acetaminophen (NORCO/VICODIN) 5-325 MG tablet Take 1-2 tablets by mouth every 6 (six) hours as needed for moderate pain. 20 tablet 0  . lidocaine-prilocaine (EMLA) cream Apply 1 application topically as needed. Apply to port before chemotherapy. 30 g 0  . metFORMIN (GLUCOPHAGE) 1000 MG tablet Take 1 tablet (1,000 mg total) by mouth 2 (two) times daily with a meal. 90 tablet 0  . Multiple Vitamin (  MULTIVITAMIN) tablet Take 1 tablet by mouth daily.    [name] omeprazole (PRILOSEC) 20 MG capsule Take 20 mg by mouth every morning.    [name] DELICA LANCETS 19F MISC Use to check blood sugar 2 times per day. 200 each 2  . pioglitazone (ACTOS) 30 MG tablet Take 1 tablet (30 mg total) by mouth daily. 90 tablet 0  . prochlorperazine (COMPAZINE) 10 MG tablet Take 1 tablet (10 mg total) by mouth every 6 (six) hours as needed for nausea or vomiting. 30 tablet 0  . pseudoephedrine-acetaminophen (TYLENOL SINUS) 30-500 MG TABS tablet Take 1 tablet by mouth every 4 (four) hours as needed (congestion).      No current facility-administered medications on file prior to visit.    Allergies  Allergen Reactions  . Scallops [Shellfish Allergy] Nausea And Vomiting  . Cephalexin Hives and Rash   Family History  Problem Relation Age of Onset  . Cancer Mother     breast/lived 59 years post  . Alzheimer's disease Mother     deceased  . Cancer Brother     thyroid  . Heart disease Brother   . Stroke Father     71 years old  . Hypertension Father   . Alcoholism Father   . CVA Father   . Cirrhosis Paternal Grandfather     alcohol-related  . Cancer Paternal Grandmother     unknown type    PE: BP 120/74 (BP Location: Left Arm, Patient Position: Sitting)   Pulse 80   Ht '5\' 10"'  (1.778 m)   Wt 187 lb (84.8 kg)   SpO2  96%   BMI 26.83 kg/m  Wt Readings from Last 3 Encounters:  06/30/16 187 lb (84.8 kg)  06/21/16 190 lb 4.8 oz (86.3 kg)  05/31/16 189 lb 4.8 oz (85.9 kg)   Constitutional: normal weight, in NAD Eyes: PERRLA, EOMI, no exophthalmos ENT: moist mucous membranes, no thyromegaly, no cervical lymphadenopathy Cardiovascular: RRR, No MRG, + very mild periankle edema Respiratory: CTA B Gastrointestinal: abdomen soft, NT, ND, BS+ Musculoskeletal: no deformities, strength intact in all 4 Skin: moist, warm, no rashes Neurological: no tremor with outstretched hands, DTR normal in all 4  ASSESSMENT: 1. DM2, non-insulin-dependent, uncontrolled, with complications - PN  PLAN:  1. Patient with long-standing, uncontrolled diabetes, on oral antidiabetic regimen. He has metastatic PrCA , doing well on ChTx >> tumor regressed and he feels well except when he takes Neulasta. He had 5 tx's and may have 1-5 tx's left. He gets Dexamethasone with this. Sugars are higher but only spikes. - will continue the same regimen, but after he finishes ChTx, we may be able to decrease Actos. - I suggested to:  Patient Instructions  Please continue: - Metformin 1000 mg 2x a day, with meals - Actos 30 mg daily in a.m. - Glipizide ER 5 mg daily in am, before b'fast  Please let me know if the sugars are consistently <80 or >200.  Please return in 3 months with your sugar log.  - continue checking sugars at different times of the day - check once a day, rotating checks - advised for yearly eye exams >> he needs one - checked HbA1c today: 6.6% (same) - Return to clinic in 3 mo with sugar log   Philemon Kingdom, MD PhD [facility] Endocrinology

## 2016-06-30 NOTE — Patient Instructions (Signed)
Please continue: - Metformin 1000 mg 2x a day, with meals - Actos 30 mg daily in a.m. - Glipizide ER 5 mg daily in am, before b'fast  Please let me know if the sugars are consistently <80 or >200.  Please return in 3 months with your sugar log.

## 2016-06-30 NOTE — Addendum Note (Signed)
Addended by: Caprice Beaver T on: 06/30/2016 09:30 AM   Modules accepted: Orders

## 2016-06-30 NOTE — Telephone Encounter (Signed)
Pt called in and said that the pharmacy has informed him that they have the wrong phone number and wrong fax number on file from [place] and that is why they never hear back when they send a refill request, the patient is wanting [place] to call and update this information in the pharmacy's system.  I explained to him that the pharmacy should have called [place] to check on this information but he said that nothing is being done and that we need to fix it for him.

## 2016-07-06 ENCOUNTER — Telehealth: Payer: Self-pay | Admitting: *Deleted

## 2016-07-06 NOTE — Telephone Encounter (Signed)
Patient calling to ask if it is absolutely necessary for a neulasta injection after chemotherapy. After insurance it is costing him $800.00 out of pocket. States he stays home after chemo, is retired and out of the work force.

## 2016-07-06 NOTE — Telephone Encounter (Signed)
Ok to [employer] for now.

## 2016-07-11 ENCOUNTER — Encounter: Payer: Self-pay | Admitting: Pharmacy Technician

## 2016-07-11 DIAGNOSIS — J209 Acute bronchitis, unspecified: Secondary | ICD-10-CM | POA: Diagnosis not present

## 2016-07-11 NOTE — Telephone Encounter (Signed)
Spoke with patient. Per dr Alen Blew, Let him know it would be okay to hold neulasta for now. He will discuss with dr Alen Blew at regularly scheduled visit tomorrow 07/12/16

## 2016-07-12 ENCOUNTER — Other Ambulatory Visit (HOSPITAL_BASED_OUTPATIENT_CLINIC_OR_DEPARTMENT_OTHER): Payer: PPO

## 2016-07-12 ENCOUNTER — Ambulatory Visit: Payer: PPO

## 2016-07-12 ENCOUNTER — Telehealth: Payer: Self-pay | Admitting: Oncology

## 2016-07-12 ENCOUNTER — Ambulatory Visit (HOSPITAL_BASED_OUTPATIENT_CLINIC_OR_DEPARTMENT_OTHER): Payer: PPO | Admitting: Oncology

## 2016-07-12 ENCOUNTER — Ambulatory Visit (HOSPITAL_BASED_OUTPATIENT_CLINIC_OR_DEPARTMENT_OTHER): Payer: PPO

## 2016-07-12 VITALS — BP 147/58 | HR 95 | Temp 98.6°F | Resp 18 | Wt 188.6 lb

## 2016-07-12 DIAGNOSIS — C61 Malignant neoplasm of prostate: Secondary | ICD-10-CM

## 2016-07-12 DIAGNOSIS — Z5111 Encounter for antineoplastic chemotherapy: Secondary | ICD-10-CM

## 2016-07-12 DIAGNOSIS — C786 Secondary malignant neoplasm of retroperitoneum and peritoneum: Secondary | ICD-10-CM

## 2016-07-12 DIAGNOSIS — Z95828 Presence of other vascular implants and grafts: Secondary | ICD-10-CM

## 2016-07-12 LAB — COMPREHENSIVE METABOLIC PANEL
ALT: 12 U/L (ref 0–55)
AST: 14 U/L (ref 5–34)
Albumin: 3.1 g/dL — ABNORMAL LOW (ref 3.5–5.0)
Alkaline Phosphatase: 50 U/L (ref 40–150)
Anion Gap: 9 mEq/L (ref 3–11)
BUN: 14.8 mg/dL (ref 7.0–26.0)
CHLORIDE: 105 meq/L (ref 98–109)
CO2: 26 mEq/L (ref 22–29)
CREATININE: 0.9 mg/dL (ref 0.7–1.3)
Calcium: 9.1 mg/dL (ref 8.4–10.4)
EGFR: 86 mL/min/{1.73_m2} — ABNORMAL LOW (ref >90)
Glucose: 228 mg/dl — ABNORMAL HIGH (ref 70–140)
Potassium: 4 mEq/L (ref 3.5–5.1)
Sodium: 139 mEq/L (ref 136–145)
Total Bilirubin: 0.32 mg/dL (ref 0.20–1.20)
Total Protein: 6.3 g/dL — ABNORMAL LOW (ref 6.4–8.3)

## 2016-07-12 LAB — CBC WITH DIFFERENTIAL/PLATELET
BASO%: 1.3 % (ref 0.0–2.0)
Basophils Absolute: 0.1 10*3/uL (ref 0.0–0.1)
EOS%: 0.2 % (ref 0.0–7.0)
Eosinophils Absolute: 0 10*3/uL (ref 0.0–0.5)
HCT: 27.9 % — ABNORMAL LOW (ref 38.4–49.9)
HGB: 9 g/dL — ABNORMAL LOW (ref 13.0–17.1)
LYMPH#: 0.7 10*3/uL — AB (ref 0.9–3.3)
LYMPH%: 7.5 % — AB (ref 14.0–49.0)
MCH: 27.8 pg (ref 27.2–33.4)
MCHC: 32.5 g/dL (ref 32.0–36.0)
MCV: 85.6 fL (ref 79.3–98.0)
MONO#: 1 10*3/uL — ABNORMAL HIGH (ref 0.1–0.9)
MONO%: 11.2 % (ref 0.0–14.0)
NEUT#: 7.3 10*3/uL — ABNORMAL HIGH (ref 1.5–6.5)
NEUT%: 79.8 % — AB (ref 39.0–75.0)
Platelets: 508 10*3/uL — ABNORMAL HIGH (ref 140–400)
RBC: 3.25 10*6/uL — ABNORMAL LOW (ref 4.20–5.82)
RDW: 18.5 % — ABNORMAL HIGH (ref 11.0–14.6)
WBC: 9.1 10*3/uL (ref 4.0–10.3)

## 2016-07-12 MED ORDER — SODIUM CHLORIDE 0.9 % IV SOLN
75.0000 mg/m2 | Freq: Once | INTRAVENOUS | Status: AC
  Administered 2016-07-12: 150 mg via INTRAVENOUS
  Filled 2016-07-12: qty 15

## 2016-07-12 MED ORDER — DEXAMETHASONE SODIUM PHOSPHATE 10 MG/ML IJ SOLN
10.0000 mg | Freq: Once | INTRAMUSCULAR | Status: AC
  Administered 2016-07-12: 10 mg via INTRAVENOUS

## 2016-07-12 MED ORDER — SODIUM CHLORIDE 0.9 % IV SOLN
Freq: Once | INTRAVENOUS | Status: AC
  Administered 2016-07-12: 09:00:00 via INTRAVENOUS

## 2016-07-12 MED ORDER — SODIUM CHLORIDE 0.9% FLUSH
10.0000 mL | INTRAVENOUS | Status: DC | PRN
  Administered 2016-07-12: 10 mL
  Filled 2016-07-12: qty 10

## 2016-07-12 MED ORDER — HEPARIN SOD (PORK) LOCK FLUSH 100 UNIT/ML IV SOLN
500.0000 [IU] | Freq: Once | INTRAVENOUS | Status: AC | PRN
  Administered 2016-07-12: 500 [IU]
  Filled 2016-07-12: qty 5

## 2016-07-12 MED ORDER — DEXAMETHASONE SODIUM PHOSPHATE 10 MG/ML IJ SOLN
INTRAMUSCULAR | Status: AC
  Filled 2016-07-12: qty 1

## 2016-07-12 MED ORDER — SODIUM CHLORIDE 0.9% FLUSH
10.0000 mL | INTRAVENOUS | Status: DC | PRN
  Administered 2016-07-12: 10 mL via INTRAVENOUS
  Filled 2016-07-12: qty 10

## 2016-07-12 NOTE — Progress Notes (Signed)
Hematology and Oncology Follow Up Visit  Frank Larsen [id number] Mar 07, 1946 71 y.o. 07/12/2016 8:39 AM Frank Larsen, Frank Larsen, Frank Larsen   Principle Diagnosis: 71 year old gentleman with prostate cancer diagnosed in 2013 after presenting with a PSA of 3.6 and a Gleason score 5+4 = 9. He has peritoneal carcinomatosis biopsy proven to be adenocarcinoma likely of a prostate primary.   Prior Therapy: He is status post robotic-assisted laparoscopic radical prostatectomy and extended lymphadenectomy with the pathology reveals T2N0.  completed in June 2013.  Current therapy: Taxotere chemotherapy at 75 mg/m every 3 weeks started on 03/30/2016. He is here for cycle 6 of therapy.  Interim History: Frank Larsen today for a follow-up visit. Since the last visit, he developed an episode of bronchitis that included cough and congestion. He was prescribed a Z-Pak as well as cough suppressants by his primary care physician. He denies any fevers, shortness of breath or dyspnea exertion.   He tolerated the last cycle of chemotherapy without complications.  He denied any peripheral neuropathy. He denied any nausea, vomiting or change in his appetite. He does report some occasional lacrimal duct dysfunction but has been manageable. He is not reporting any abdominal pain and does not take any pain medication at this time.   He does not report any headaches, blurry vision, syncope or seizures. He does not report any fevers, chills, sweats. He does not report any chest pain, palpitation, orthopnea or leg edema. He does not report any cough, wheezing or hemoptysis. He does not report any hematochezia, melena or diarrhea at this time. He has not moved his bowels last few days. He does not report any frequency, urgency or hematuria. He does not report any skeletal complaints. Remaining review of systems and   Medications: I have reviewed the patient's current medications.  Current Outpatient Prescriptions   Medication Sig Dispense Refill  . atorvastatin (LIPITOR) 10 MG tablet Take 10 mg by mouth daily with breakfast.     . Blood Glucose Monitoring Suppl (ONE TOUCH ULTRA 2) w/Device KIT Use to check blood sugar 2 times per day. 1 each 2  . glipiZIDE (GLUCOTROL XL) 5 MG 24 hr tablet Take 1 tablet (5 mg total) by mouth daily with breakfast. 90 tablet 2  . glucose blood (ONE TOUCH ULTRA TEST) test strip Use to check blood sugar 2 times per day. 200 each 5  . HYDROcodone-acetaminophen (NORCO/VICODIN) 5-325 MG tablet Take 1-2 tablets by mouth every 6 (six) hours as needed for moderate pain. 20 tablet 0  . lidocaine-prilocaine (EMLA) cream Apply 1 application topically as needed. Apply to port before chemotherapy. 30 g 0  . metFORMIN (GLUCOPHAGE) 1000 MG tablet Take 1 tablet (1,000 mg total) by mouth 2 (two) times daily with a meal. 180 tablet 3  . Multiple Vitamin (MULTIVITAMIN) tablet Take 1 tablet by mouth daily.    [name] omeprazole (PRILOSEC) 20 MG capsule Take 20 mg by mouth every morning.    Frank Larsen DELICA LANCETS 19J MISC Use to check blood sugar 2 times per day. 200 each 2  . pioglitazone (ACTOS) 30 MG tablet Take 1 tablet (30 mg total) by mouth daily. 90 tablet 3  . prochlorperazine (COMPAZINE) 10 MG tablet Take 1 tablet (10 mg total) by mouth every 6 (six) hours as needed for nausea or vomiting. 30 tablet 0  . pseudoephedrine-acetaminophen (TYLENOL SINUS) 30-500 MG TABS tablet Take 1 tablet by mouth every 4 (four) hours as needed (congestion).      No current  facility-administered medications for this visit.      Allergies:  Allergies  Allergen Reactions  . Scallops [Shellfish Allergy] Nausea And Vomiting  . Cephalexin Hives and Rash    Past Medical History, Surgical history, Social history, and Family History were reviewed and updated.   Physical Exam: Blood pressure (!) 147/58, pulse 95, temperature 98.6 F (37 C), temperature source Oral, resp. rate 18, weight 188 lb 9.6 oz (85.5  kg), SpO2 98 %. ECOG: 1 General appearance: Well-appearing gentleman without distress. Head: Normocephalic, without obvious abnormality no oral ulcers or lesions. Neck: no adenopathy Lymph nodes: Cervical, supraclavicular, and axillary nodes normal. Heart:regular rate and rhythm, S1, S2 normal, no murmur, click, rub or gallop Lung:chest clear, no wheezing, rales, normal symmetric air entry Abdomin: soft, without masses or organomegaly. No shifting dullness or ascites. EXT:no erythema, induration, or nodules   Lab Results: Lab Results  Component Value Date   WBC 9.1 07/12/2016   HGB 9.0 (L) 07/12/2016   HCT 27.9 (L) 07/12/2016   MCV 85.6 07/12/2016   PLT 508 (H) 07/12/2016     Chemistry      Component Value Date/Time   NA 141 06/21/2016 0737   K 4.2 06/21/2016 0737   CL 102 03/05/2016 1459   CO2 23 06/21/2016 0737   BUN 18.5 06/21/2016 0737   CREATININE 0.9 06/21/2016 0737   GLU 155 08/24/2015      Component Value Date/Time   CALCIUM 9.3 06/21/2016 0737   ALKPHOS 45 06/21/2016 0737   AST 19 06/21/2016 0737   ALT 17 06/21/2016 0737   BILITOT 0.39 06/21/2016 0737     Results for Frank Larsen, Frank Larsen (MRN [id number]) as of 07/12/2016 08:40  Ref. Range 05/10/2016 09:40 05/31/2016 08:14 06/21/2016 07:37  PSA Latest Ref Range: 0.0 - 4.0 ng/mL 0.5 0.2 <0.1   EXAM: CT CHEST, ABDOMEN, AND PELVIS WITH CONTRAST  TECHNIQUE: Multidetector CT imaging of the chest, abdomen and pelvis was performed following the standard protocol during bolus administration of intravenous contrast.  CONTRAST:  1 ISOVUE-300 IOPAMIDOL (ISOVUE-300) INJECTION 61%  COMPARISON:  CT 03/01/2016  FINDINGS: CT CHEST FINDINGS  Cardiovascular: Port in the RIGHT chest wall. No central pulmonary embolism. No pericardial fluid.  Mediastinum/Nodes: No axillary supraclavicular adenopathy. No mediastinal hilar adenopathy. Esophagus normal.  Lungs/Pleura: The mild linear scarring at the lung bases.  No suspicious nodularity.  Musculoskeletal: Expansile sclerotic lesion in the lateral RIGHT eighth rib is not changed  CT ABDOMEN AND PELVIS FINDINGS  Hepatobiliary: Small hypodense lesion LEFT hepatic lobe measuring 5 mm likely represent benign cysts. Lesion unchanged. Normal gallbladder.  Pancreas: Pancreas is normal. No ductal dilatation. No pancreatic inflammation.  Spleen: Normal spleen  Adrenals/urinary tract: Adrenal glands and kidneys are normal. The ureters and bladder normal.  Stomach/Bowel: Stomach, duodenum small-bowel normal. Cecum is normal. Transverse colon descending colon normal.  Within the omentum of the transverse colon and (lesser omentum) there is haziness with mild thickening which is similar to comparison exam. In the greater omentum, there is similar mild haziness unchanged. No measurable nodularity.  No retroperitoneal nodularity.  Vascular/Lymphatic: Abdominal aorta is normal caliber with atherosclerotic calcification. There is no retroperitoneal or periportal lymphadenopathy. No pelvic lymphadenopathy.  Reproductive: Prostate surgically absent no nodularity in the prostate bed up.  Other: Small amount free fluid the upper pelvis  Musculoskeletal: Expansile sclerotic lesion in the lateral RIGHT eighth rib is not changed.  IMPRESSION: Chest Impression:  1. No evidence of soft tissue thoracic metastasis. 2. Stable sclerotic expansile lesion  with lateral RIGHT eighth rib.  Abdomen / Pelvis Impression:  1. Haziness and edema within the lesser and greater omentum is not changed from comparison exam. No measurable lesion present. 2. Small amount free fluid in the lower pelvis. 3. Post prostatectomy     Impression and Plan:   71 year old gentleman with the following issues:  1. 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 This was noted after presenting with symptoms of nausea, diarrhea and  vomiting. He reports poor by mouth intake and weight loss. He does have history of prostate cancer Gleason score 5+4 = 9 resected in 2013.  He is status post biopsy obtained on 03/14/2016 and the final pathology showed metastatic carcinoma that is poorly differentiated that favors prostate cancer. Although the specimen did not stain for PSA his tumor is poorly differentiated likely secretes very little to no PSA. His last PSA was 1.3.  He is currently receiving Taxotere salvage chemotherapy started on 03/30/2016.   He received 5 cycles of therapy without any major complications. CT scan on 06/28/2016 was personally reviewed today and showed excellent response to therapy. There is no clear-cut residual disease that was noted. He does have some haziness around the omentum which suggest residual tumor.  The plan is to proceed with cycle 6 without any dose reduction or delay. We'll plan to proceed with a total of 10 cycles of therapy.  2. Abdominal pain: Much improved at this time and no longer taking pain medications.   3. Antiemetics: Prescription for Compazine was made available to the patient. No issues reported since the last visit.  4. IV access: Port-A-Cath placed and reports no recent complications.  5. Neutropenia prophylaxis: He is refusing a Neulasta with the next chemotherapy. Risks and benefits of discontinuation of Neulasta were reviewed today extensively. He is agreeable to hold it for the time being. The reason for the hold is related to side effects associated with this medication as well as financial burden. He understands there is a risk of neutropenia and neutropenic fever and possible hospitalization associated with Taxotere chemotherapy.  6. Follow-up: Will be in 3 weeks for cycle 7 of chemotherapy.    [facility], Frank Larsen 3/6/20188:39 AM

## 2016-07-12 NOTE — Telephone Encounter (Signed)
Gave patient AVS and calendar per 07/12/2016 los

## 2016-07-12 NOTE — Patient Instructions (Signed)
[facility] Discharge Instructions for Patients Receiving Chemotherapy  Today you received the following chemotherapy agents Taxotere.  To help prevent nausea and vomiting after your treatment, we encourage you to take your nausea medication as prescribed.   If you develop nausea and vomiting that is not controlled by your nausea medication, call the clinic.   BELOW ARE SYMPTOMS THAT SHOULD BE REPORTED IMMEDIATELY:  *FEVER GREATER THAN 100.5 F  *CHILLS WITH OR WITHOUT FEVER  NAUSEA AND VOMITING THAT IS NOT CONTROLLED WITH YOUR NAUSEA MEDICATION  *UNUSUAL SHORTNESS OF BREATH  *UNUSUAL BRUISING OR BLEEDING  TENDERNESS IN MOUTH AND THROAT WITH OR WITHOUT PRESENCE OF ULCERS  *URINARY PROBLEMS  *BOWEL PROBLEMS  UNUSUAL RASH Items with * indicate a potential emergency and should be followed up as soon as possible.  Feel free to call the clinic you have any questions or concerns. The clinic phone number is [phone].  Please show the CHEMO ALERT CARD at check-in to the Emergency Department and triage nurse.   

## 2016-07-13 LAB — PSA

## 2016-07-14 ENCOUNTER — Ambulatory Visit: Payer: PPO

## 2016-08-02 ENCOUNTER — Ambulatory Visit (HOSPITAL_BASED_OUTPATIENT_CLINIC_OR_DEPARTMENT_OTHER): Payer: PPO

## 2016-08-02 ENCOUNTER — Ambulatory Visit: Payer: PPO

## 2016-08-02 ENCOUNTER — Telehealth: Payer: Self-pay | Admitting: Oncology

## 2016-08-02 ENCOUNTER — Ambulatory Visit (HOSPITAL_BASED_OUTPATIENT_CLINIC_OR_DEPARTMENT_OTHER): Payer: PPO | Admitting: Oncology

## 2016-08-02 VITALS — BP 116/68 | HR 76 | Temp 98.3°F | Resp 18 | Ht 70.0 in | Wt 197.5 lb

## 2016-08-02 DIAGNOSIS — Z5111 Encounter for antineoplastic chemotherapy: Secondary | ICD-10-CM

## 2016-08-02 DIAGNOSIS — C61 Malignant neoplasm of prostate: Secondary | ICD-10-CM

## 2016-08-02 DIAGNOSIS — C786 Secondary malignant neoplasm of retroperitoneum and peritoneum: Secondary | ICD-10-CM

## 2016-08-02 DIAGNOSIS — Z95828 Presence of other vascular implants and grafts: Secondary | ICD-10-CM

## 2016-08-02 LAB — CBC WITH DIFFERENTIAL/PLATELET
BASO%: 1.5 % (ref 0.0–2.0)
BASOS ABS: 0.1 10*3/uL (ref 0.0–0.1)
EOS%: 1.1 % (ref 0.0–7.0)
Eosinophils Absolute: 0.1 10*3/uL (ref 0.0–0.5)
HCT: 27.8 % — ABNORMAL LOW (ref 38.4–49.9)
HGB: 9 g/dL — ABNORMAL LOW (ref 13.0–17.1)
LYMPH%: 12.5 % — ABNORMAL LOW (ref 14.0–49.0)
MCH: 26.7 pg — ABNORMAL LOW (ref 27.2–33.4)
MCHC: 32.2 g/dL (ref 32.0–36.0)
MCV: 83 fL (ref 79.3–98.0)
MONO#: 0.8 10*3/uL (ref 0.1–0.9)
MONO%: 14.4 % — AB (ref 0.0–14.0)
NEUT%: 70.5 % (ref 39.0–75.0)
NEUTROS ABS: 4 10*3/uL (ref 1.5–6.5)
Platelets: 427 10*3/uL — ABNORMAL HIGH (ref 140–400)
RBC: 3.35 10*6/uL — AB (ref 4.20–5.82)
RDW: 17.9 % — AB (ref 11.0–14.6)
WBC: 5.7 10*3/uL (ref 4.0–10.3)
lymph#: 0.7 10*3/uL — ABNORMAL LOW (ref 0.9–3.3)

## 2016-08-02 LAB — COMPREHENSIVE METABOLIC PANEL
ALT: 10 U/L (ref 0–55)
AST: 15 U/L (ref 5–34)
Albumin: 3.1 g/dL — ABNORMAL LOW (ref 3.5–5.0)
Alkaline Phosphatase: 44 U/L (ref 40–150)
Anion Gap: 11 mEq/L (ref 3–11)
BILIRUBIN TOTAL: 0.24 mg/dL (ref 0.20–1.20)
BUN: 11.7 mg/dL (ref 7.0–26.0)
CHLORIDE: 109 meq/L (ref 98–109)
CO2: 21 meq/L — AB (ref 22–29)
CREATININE: 0.8 mg/dL (ref 0.7–1.3)
Calcium: 8.9 mg/dL (ref 8.4–10.4)
EGFR: 89 mL/min/{1.73_m2} — ABNORMAL LOW (ref >90)
GLUCOSE: 169 mg/dL — AB (ref 70–140)
Potassium: 4.3 mEq/L (ref 3.5–5.1)
Sodium: 141 mEq/L (ref 136–145)
TOTAL PROTEIN: 5.7 g/dL — AB (ref 6.4–8.3)

## 2016-08-02 MED ORDER — SODIUM CHLORIDE 0.9 % IV SOLN
75.0000 mg/m2 | Freq: Once | INTRAVENOUS | Status: AC
  Administered 2016-08-02: 150 mg via INTRAVENOUS
  Filled 2016-08-02: qty 15

## 2016-08-02 MED ORDER — SODIUM CHLORIDE 0.9 % IV SOLN
Freq: Once | INTRAVENOUS | Status: AC
  Administered 2016-08-02: 09:00:00 via INTRAVENOUS

## 2016-08-02 MED ORDER — SODIUM CHLORIDE 0.9% FLUSH
10.0000 mL | INTRAVENOUS | Status: DC | PRN
  Administered 2016-08-02: 10 mL via INTRAVENOUS
  Filled 2016-08-02: qty 10

## 2016-08-02 MED ORDER — HEPARIN SOD (PORK) LOCK FLUSH 100 UNIT/ML IV SOLN
500.0000 [IU] | Freq: Once | INTRAVENOUS | Status: DC | PRN
  Filled 2016-08-02: qty 5

## 2016-08-02 MED ORDER — SODIUM CHLORIDE 0.9% FLUSH
10.0000 mL | INTRAVENOUS | Status: DC | PRN
  Administered 2016-08-02: 10 mL
  Filled 2016-08-02: qty 10

## 2016-08-02 MED ORDER — HEPARIN SOD (PORK) LOCK FLUSH 100 UNIT/ML IV SOLN
500.0000 [IU] | Freq: Once | INTRAVENOUS | Status: AC | PRN
  Administered 2016-08-02: 500 [IU]
  Filled 2016-08-02: qty 5

## 2016-08-02 MED ORDER — DEXAMETHASONE SODIUM PHOSPHATE 10 MG/ML IJ SOLN
10.0000 mg | Freq: Once | INTRAMUSCULAR | Status: AC
  Administered 2016-08-02: 10 mg via INTRAVENOUS

## 2016-08-02 MED ORDER — DEXAMETHASONE SODIUM PHOSPHATE 10 MG/ML IJ SOLN
INTRAMUSCULAR | Status: AC
Start: 2016-08-02 — End: 2016-08-02
  Filled 2016-08-02: qty 1

## 2016-08-02 NOTE — Patient Instructions (Signed)
[facility] Discharge Instructions for Patients Receiving Chemotherapy  Today you received the following chemotherapy agents:  Taxotere.  To help prevent nausea and vomiting after your treatment, we encourage you to take your nausea medication as directed.   If you develop nausea and vomiting that is not controlled by your nausea medication, call the clinic.   BELOW ARE SYMPTOMS THAT SHOULD BE REPORTED IMMEDIATELY:  *FEVER GREATER THAN 100.5 F  *CHILLS WITH OR WITHOUT FEVER  NAUSEA AND VOMITING THAT IS NOT CONTROLLED WITH YOUR NAUSEA MEDICATION  *UNUSUAL SHORTNESS OF BREATH  *UNUSUAL BRUISING OR BLEEDING  TENDERNESS IN MOUTH AND THROAT WITH OR WITHOUT PRESENCE OF ULCERS  *URINARY PROBLEMS  *BOWEL PROBLEMS  UNUSUAL RASH Items with * indicate a potential emergency and should be followed up as soon as possible.  Feel free to call the clinic you have any questions or concerns. The clinic phone number is [phone].  Please show the CHEMO ALERT CARD at check-in to the Emergency Department and triage nurse.  

## 2016-08-02 NOTE — Telephone Encounter (Signed)
Gave patient wife avs report and appointments for April and May

## 2016-08-02 NOTE — Progress Notes (Signed)
Hematology and Oncology Follow Up Visit  Frank Larsen [id number] 28-May-1945 70 y.o. 08/02/2016 8:48 AM Frank Larsen, Frank Brow, MD   Principle Diagnosis: 71 year old gentleman with prostate cancer diagnosed in 2013 after presenting with a PSA of 3.6 and a Gleason score 5+4 = 9. He has peritoneal carcinomatosis biopsy proven to be adenocarcinoma likely of a prostate primary.   Prior Therapy: He is status post robotic-assisted laparoscopic radical prostatectomy and extended lymphadenectomy with the pathology reveals T2N0.  completed in June 2013.  Current therapy: Taxotere chemotherapy at 75 mg/m every 3 weeks started on 03/30/2016. He is here for cycle 7 of therapy.  Interim History: Frank Larsen presents today for a follow-up visit. Since the last visit, he reports more symptoms of fatigue and tiredness associated with the last cycle of chemotherapy. He reports that his fatigue has lessened longer compared to previous cycles. He denied any other complications such as nausea, vomiting or fevers. He did not receive Neulasta with the last chemotherapy.  He denied any recent abdominal pain or distention. His appetite has been excellent and have gained more weight. He did report some mild lower extremity edema that has resolved at this time. He does not report any peripheral neuropathy or ambulation difficulties. He still enjoying a reasonable quality of life.   He does not report any headaches, blurry vision, syncope or seizures. He does not report any fevers, chills, sweats. He does not report any chest pain, palpitation, orthopnea or leg edema. He does not report any cough, wheezing or hemoptysis. He does not report any hematochezia, melena or diarrhea at this time. He has not moved his bowels last few days. He does not report any frequency, urgency or hematuria. He does not report any skeletal complaints. Remaining review of systems and   Medications: I have reviewed the patient's  current medications.  Current Outpatient Prescriptions  Medication Sig Dispense Refill  . atorvastatin (LIPITOR) 10 MG tablet Take 10 mg by mouth daily with breakfast.     . Blood Glucose Monitoring Suppl (ONE TOUCH ULTRA 2) w/Device KIT Use to check blood sugar 2 times per day. 1 each 2  . glipiZIDE (GLUCOTROL XL) 5 MG 24 hr tablet Take 1 tablet (5 mg total) by mouth daily with breakfast. 90 tablet 2  . glucose blood (ONE TOUCH ULTRA TEST) test strip Use to check blood sugar 2 times per day. 200 each 5  . HYDROcodone-acetaminophen (NORCO/VICODIN) 5-325 MG tablet Take 1-2 tablets by mouth every 6 (six) hours as needed for moderate pain. 20 tablet 0  . lidocaine-prilocaine (EMLA) cream Apply 1 application topically as needed. Apply to port before chemotherapy. 30 g 0  . metFORMIN (GLUCOPHAGE) 1000 MG tablet Take 1 tablet (1,000 mg total) by mouth 2 (two) times daily with a meal. 180 tablet 3  . Multiple Vitamin (MULTIVITAMIN) tablet Take 1 tablet by mouth daily.    [name] omeprazole (PRILOSEC) 20 MG capsule Take 20 mg by mouth every morning.    Frank Larsen DELICA LANCETS 50T MISC Use to check blood sugar 2 times per day. 200 each 2  . pioglitazone (ACTOS) 30 MG tablet Take 1 tablet (30 mg total) by mouth daily. 90 tablet 3  . prochlorperazine (COMPAZINE) 10 MG tablet Take 1 tablet (10 mg total) by mouth every 6 (six) hours as needed for nausea or vomiting. 30 tablet 0  . pseudoephedrine-acetaminophen (TYLENOL SINUS) 30-500 MG TABS tablet Take 1 tablet by mouth every 4 (four) hours as needed (congestion).  No current facility-administered medications for this visit.      Allergies:  Allergies  Allergen Reactions  . Scallops [Shellfish Allergy] Nausea And Vomiting  . Cephalexin Hives and Rash    Past Medical History, Surgical history, Social history, and Family History were reviewed and updated.   Physical Exam: Blood pressure 116/68, pulse 76, temperature 98.3 F (36.8 C), temperature  source Oral, resp. rate 18, height _0  (1.778 m), weight 197 lb 8 oz (89.6 kg), SpO2 100 %. ECOG: 1 General appearance: Alert, awake gentleman without distress. Head: Normocephalic, without obvious abnormality no oral thrush or ulcers. Neck: no adenopathy Lymph nodes: Cervical, supraclavicular, and axillary nodes normal. Heart:regular rate and rhythm, S1, S2 normal, no murmur, click, rub or gallop Lung:chest clear, no wheezing, rales, normal symmetric air entry Abdomin: soft, without masses or organomegaly. No rebound or guarding. EXT:no erythema, induration, or nodules   Lab Results: Lab Results  Component Value Date   WBC 5.7 08/02/2016   HGB 9.0 (L) 08/02/2016   HCT 27.8 (L) 08/02/2016   MCV 83.0 08/02/2016   PLT 427 (H) 08/02/2016     Chemistry      Component Value Date/Time   NA 139 07/12/2016 0737   K 4.0 07/12/2016 0737   CL 102 03/05/2016 1459   CO2 26 07/12/2016 0737   BUN 14.8 07/12/2016 0737   CREATININE 0.9 07/12/2016 0737   GLU 155 08/24/2015      Component Value Date/Time   CALCIUM 9.1 07/12/2016 0737   ALKPHOS 50 07/12/2016 0737   AST 14 07/12/2016 0737   ALT 12 07/12/2016 0737   BILITOT 0.32 07/12/2016 0737     Results for Frank Larsen (MRN [id number]) as of 08/02/2016 08:26  Ref. Range 05/31/2016 08:14 06/21/2016 07:37 07/12/2016 07:37  PSA Latest Ref Range: 0.0 - 4.0 ng/mL 0.2 <0.1 <0.1       Impression and Plan:   71 year old gentleman with the following issues:  1. 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 This was noted after presenting with symptoms of nausea, diarrhea and vomiting. He reports poor by mouth intake and weight loss. He does have history of prostate cancer Gleason score 5+4 = 9 resected in 2013.  He is status post biopsy obtained on 03/14/2016 and the final pathology showed metastatic carcinoma that is poorly differentiated that favors prostate cancer. Although the specimen did not stain for PSA his  tumor is poorly differentiated likely secretes very little to no PSA. His last PSA was 1.3.  He is currently receiving Taxotere salvage chemotherapy started on 03/30/2016.   He received 6 cycles of therapy without any major complications. CT scan on 06/28/2016  showed excellent response to therapy. There is no clear-cut residual disease that was noted.   The plan is to proceed with cycle 7 without any dose reduction or delay. 10 cycles of chemotherapy is planned along as he is able to tolerated at this time.  2. Abdominal pain: Appears to have resolved at this time without any residual GI complications.  3. Antiemetics: Nausea and vomiting has not been a major issue for him.  4. IV access: Port-A-Cath placed and reports no recent complications.  5. Neutropenia prophylaxis: Risks and benefits of receiving Neulasta today was discussed and he is agreeable to receive it at this time.  6. Follow-up: Will be in 3 weeks for cycle 8 of chemotherapy.    [facility], MD 3/27/20188:48 AM

## 2016-08-03 LAB — PSA: Prostate Specific Ag, Serum: <0.1 ng/mL (ref 0.0–4.0)

## 2016-08-04 ENCOUNTER — Ambulatory Visit (HOSPITAL_BASED_OUTPATIENT_CLINIC_OR_DEPARTMENT_OTHER): Payer: PPO

## 2016-08-04 VITALS — BP 126/53 | HR 82 | Temp 97.8°F | Resp 20

## 2016-08-04 DIAGNOSIS — C61 Malignant neoplasm of prostate: Secondary | ICD-10-CM | POA: Diagnosis not present

## 2016-08-04 DIAGNOSIS — Z5189 Encounter for other specified aftercare: Secondary | ICD-10-CM

## 2016-08-04 MED ORDER — PEGFILGRASTIM INJECTION 6 MG/0.6ML ~~LOC~~
6.0000 mg | PREFILLED_SYRINGE | Freq: Once | SUBCUTANEOUS | Status: AC
  Administered 2016-08-04: 6 mg via SUBCUTANEOUS
  Filled 2016-08-04: qty 0.6

## 2016-08-04 NOTE — Patient Instructions (Signed)
Pegfilgrastim injection What is this medicine? PEGFILGRASTIM (PEG fil gra stim) is a long-acting granulocyte colony-stimulating factor that stimulates the growth of neutrophils, a type of white blood cell important in the body's fight against infection. It is used to reduce the incidence of fever and infection in patients with certain types of cancer who are receiving chemotherapy that affects the bone marrow, and to increase survival after being exposed to high doses of radiation. This medicine may be used for other purposes; ask your health care provider or pharmacist if you have questions. COMMON BRAND NAME(S): Neulasta What should I tell my health care provider before I take this medicine? They need to know if you have any of these conditions: -kidney disease -latex allergy -ongoing radiation therapy -sickle cell disease -skin reactions to acrylic adhesives (On-Body Injector only) -an unusual or allergic reaction to pegfilgrastim, filgrastim, other medicines, foods, dyes, or preservatives -pregnant or trying to get pregnant -breast-feeding How should I use this medicine? This medicine is for injection under the skin. If you get this medicine at home, you will be taught how to prepare and give the pre-filled syringe or how to use the On-body Injector. Refer to the patient Instructions for Use for detailed instructions. Use exactly as directed. Take your medicine at regular intervals. Do not take your medicine more often than directed. It is important that you put your used needles and syringes in a special sharps container. Do not put them in a trash can. If you do not have a sharps container, call your pharmacist or healthcare provider to get one. Talk to your pediatrician regarding the use of this medicine in children. While this drug may be prescribed for selected conditions, precautions do apply. Overdosage: If you think you have taken too much of this medicine contact a poison control  center or emergency room at once. NOTE: This medicine is only for you. Do not share this medicine with others. What if I miss a dose? It is important not to miss your dose. Call your doctor or health care professional if you miss your dose. If you miss a dose due to an On-body Injector failure or leakage, a new dose should be administered as soon as possible using a single prefilled syringe for manual use. What may interact with this medicine? Interactions have not been studied. Give your health care provider a list of all the medicines, herbs, non-prescription drugs, or dietary supplements you use. Also tell them if you smoke, drink alcohol, or use illegal drugs. Some items may interact with your medicine. This list may not describe all possible interactions. Give your health care provider a list of all the medicines, herbs, non-prescription drugs, or dietary supplements you use. Also tell them if you smoke, drink alcohol, or use illegal drugs. Some items may interact with your medicine. What should I watch for while using this medicine? You may need blood work done while you are taking this medicine. If you are going to need a MRI, CT scan, or other procedure, tell your doctor that you are using this medicine (On-Body Injector only). What side effects may I notice from receiving this medicine? Side effects that you should report to your doctor or health care professional as soon as possible: -allergic reactions like skin rash, itching or hives, swelling of the face, lips, or tongue -dizziness -fever -pain, redness, or irritation at site where injected -pinpoint red spots on the skin -red or dark-brown urine -shortness of breath or breathing problems -stomach or   side pain, or pain at the shoulder -swelling -tiredness -trouble passing urine or change in the amount of urine Side effects that usually do not require medical attention (report to your doctor or health care professional if they  continue or are bothersome): -bone pain -muscle pain This list may not describe all possible side effects. Call your doctor for medical advice about side effects. You may report side effects to FDA at 1-800-FDA-1088. Where should I keep my medicine? Keep out of the reach of children. Store pre-filled syringes in a refrigerator between 2 and 8 degrees C (36 and 46 degrees F). Do not freeze. Keep in carton to protect from light. Throw away this medicine if it is left out of the refrigerator for more than 48 hours. Throw away any unused medicine after the expiration date. NOTE: This sheet is a summary. It may not cover all possible information. If you have questions about this medicine, talk to your doctor, pharmacist, or health care provider.  2017 Elsevier/Gold Standard (2014-05-15 14:30:14)  

## 2016-08-15 ENCOUNTER — Telehealth: Payer: Self-pay | Admitting: *Deleted

## 2016-08-15 NOTE — Telephone Encounter (Signed)
Called patient with Provider instructions.  "I have a pair I use for flying and I will buy some more.  Thanks for calling."

## 2016-08-15 NOTE — Telephone Encounter (Signed)
This is related to chemotherapy. I recommend keeping his feet elevated and use compression socks. Will discuss further next visit.

## 2016-08-15 NOTE — Telephone Encounter (Signed)
"  I am on active chemotherapy, having a problem with feet swelling.  I spoke with Dr. Alen Blew about this on 08-02-2016 but it's getting worse over the past week, especially yesterday and today.  It was just the left foot but now it is both.  He said swelling is a side effect of chemotherapy but I want to make sure it's nothing more.  The left foot is larger than the right.  Both feel tight.  Difficulty putting shoes on.  My blood sugar is good.  No redness or warmth.  Unable to walk outside due to high level of fatigue and no energy.  I have to rest after walking to second level of my home.  I also have abdominal pain he's aware of.  It is not worse but I'm more aware of it.  Rate as a one on pain scale.  Return number [phone]."    Reports drinking three eight ounce waters daily.  Nurse instructions to increase fluid intake and elevate feet whenever possible.

## 2016-08-23 ENCOUNTER — Other Ambulatory Visit (HOSPITAL_BASED_OUTPATIENT_CLINIC_OR_DEPARTMENT_OTHER): Payer: PPO

## 2016-08-23 ENCOUNTER — Ambulatory Visit (HOSPITAL_BASED_OUTPATIENT_CLINIC_OR_DEPARTMENT_OTHER): Payer: PPO | Admitting: Oncology

## 2016-08-23 ENCOUNTER — Ambulatory Visit: Payer: PPO

## 2016-08-23 ENCOUNTER — Telehealth: Payer: Self-pay | Admitting: Oncology

## 2016-08-23 ENCOUNTER — Ambulatory Visit (HOSPITAL_BASED_OUTPATIENT_CLINIC_OR_DEPARTMENT_OTHER): Payer: PPO

## 2016-08-23 VITALS — BP 117/52 | HR 77 | Temp 97.6°F | Resp 18 | Ht 70.0 in | Wt 200.6 lb

## 2016-08-23 DIAGNOSIS — C61 Malignant neoplasm of prostate: Secondary | ICD-10-CM

## 2016-08-23 DIAGNOSIS — Z95828 Presence of other vascular implants and grafts: Secondary | ICD-10-CM

## 2016-08-23 DIAGNOSIS — Z5111 Encounter for antineoplastic chemotherapy: Secondary | ICD-10-CM | POA: Diagnosis not present

## 2016-08-23 DIAGNOSIS — C786 Secondary malignant neoplasm of retroperitoneum and peritoneum: Secondary | ICD-10-CM | POA: Diagnosis not present

## 2016-08-23 DIAGNOSIS — R600 Localized edema: Secondary | ICD-10-CM

## 2016-08-23 LAB — COMPREHENSIVE METABOLIC PANEL
ALBUMIN: 3.1 g/dL — AB (ref 3.5–5.0)
ALT: 14 U/L (ref 0–55)
AST: 19 U/L (ref 5–34)
Alkaline Phosphatase: 42 U/L (ref 40–150)
Anion Gap: 10 mEq/L (ref 3–11)
BILIRUBIN TOTAL: 0.32 mg/dL (ref 0.20–1.20)
BUN: 16.8 mg/dL (ref 7.0–26.0)
CO2: 23 meq/L (ref 22–29)
CREATININE: 0.8 mg/dL (ref 0.7–1.3)
Calcium: 9.1 mg/dL (ref 8.4–10.4)
Chloride: 109 mEq/L (ref 98–109)
EGFR: 90 mL/min/{1.73_m2} — ABNORMAL LOW (ref >90)
GLUCOSE: 206 mg/dL — AB (ref 70–140)
Potassium: 4.3 mEq/L (ref 3.5–5.1)
SODIUM: 142 meq/L (ref 136–145)
TOTAL PROTEIN: 5.6 g/dL — AB (ref 6.4–8.3)

## 2016-08-23 LAB — CBC WITH DIFFERENTIAL/PLATELET
BASO%: 1.8 % (ref 0.0–2.0)
Basophils Absolute: 0.1 10*3/uL (ref 0.0–0.1)
EOS ABS: 0.1 10*3/uL (ref 0.0–0.5)
EOS%: 1 % (ref 0.0–7.0)
HCT: 28.4 % — ABNORMAL LOW (ref 38.4–49.9)
HEMOGLOBIN: 9 g/dL — AB (ref 13.0–17.1)
LYMPH%: 8.4 % — AB (ref 14.0–49.0)
MCH: 25.5 pg — ABNORMAL LOW (ref 27.2–33.4)
MCHC: 31.6 g/dL — ABNORMAL LOW (ref 32.0–36.0)
MCV: 80.7 fL (ref 79.3–98.0)
MONO#: 0.8 10*3/uL (ref 0.1–0.9)
MONO%: 11.1 % (ref 0.0–14.0)
NEUT%: 77.7 % — ABNORMAL HIGH (ref 39.0–75.0)
NEUTROS ABS: 5.3 10*3/uL (ref 1.5–6.5)
Platelets: 499 10*3/uL — ABNORMAL HIGH (ref 140–400)
RBC: 3.52 10*6/uL — ABNORMAL LOW (ref 4.20–5.82)
RDW: 19.8 % — ABNORMAL HIGH (ref 11.0–14.6)
WBC: 6.8 10*3/uL (ref 4.0–10.3)
lymph#: 0.6 10*3/uL — ABNORMAL LOW (ref 0.9–3.3)

## 2016-08-23 MED ORDER — SODIUM CHLORIDE 0.9% FLUSH
10.0000 mL | INTRAVENOUS | Status: DC | PRN
  Administered 2016-08-23: 10 mL via INTRAVENOUS
  Filled 2016-08-23: qty 10

## 2016-08-23 MED ORDER — HEPARIN SOD (PORK) LOCK FLUSH 100 UNIT/ML IV SOLN
500.0000 [IU] | Freq: Once | INTRAVENOUS | Status: AC | PRN
Start: 2016-08-23 — End: 2016-08-23
  Administered 2016-08-23: 500 [IU]
  Filled 2016-08-23: qty 5

## 2016-08-23 MED ORDER — DEXAMETHASONE SODIUM PHOSPHATE 10 MG/ML IJ SOLN
10.0000 mg | Freq: Once | INTRAMUSCULAR | Status: AC
  Administered 2016-08-23: 10 mg via INTRAVENOUS

## 2016-08-23 MED ORDER — SODIUM CHLORIDE 0.9% FLUSH
10.0000 mL | INTRAVENOUS | Status: DC | PRN
  Administered 2016-08-23: 10 mL
  Filled 2016-08-23: qty 10

## 2016-08-23 MED ORDER — DEXAMETHASONE SODIUM PHOSPHATE 10 MG/ML IJ SOLN
INTRAMUSCULAR | Status: AC
  Filled 2016-08-23: qty 1

## 2016-08-23 MED ORDER — SODIUM CHLORIDE 0.9 % IV SOLN
Freq: Once | INTRAVENOUS | Status: AC
  Administered 2016-08-23: 11:00:00 via INTRAVENOUS

## 2016-08-23 MED ORDER — SODIUM CHLORIDE 0.9 % IV SOLN
75.0000 mg/m2 | Freq: Once | INTRAVENOUS | Status: AC
  Administered 2016-08-23: 150 mg via INTRAVENOUS
  Filled 2016-08-23: qty 15

## 2016-08-23 NOTE — Telephone Encounter (Signed)
Appointments scheduled per 08/23/16 los. Patient was given a copy of the AVS report and appointment schedule per 08/23/16 los. °

## 2016-08-23 NOTE — Patient Instructions (Signed)
[facility] Discharge Instructions for Patients Receiving Chemotherapy  Today you received the following chemotherapy agents: Taxotere.  To help prevent nausea and vomiting after your treatment, we encourage you to take your nausea medication: Compazine. Take one every 6 hours as needed.  If you develop nausea and vomiting that is not controlled by your nausea medication, call the clinic.   BELOW ARE SYMPTOMS THAT SHOULD BE REPORTED IMMEDIATELY:  *FEVER GREATER THAN 100.5 F  *CHILLS WITH OR WITHOUT FEVER  NAUSEA AND VOMITING THAT IS NOT CONTROLLED WITH YOUR NAUSEA MEDICATION  *UNUSUAL SHORTNESS OF BREATH  *UNUSUAL BRUISING OR BLEEDING  TENDERNESS IN MOUTH AND THROAT WITH OR WITHOUT PRESENCE OF ULCERS  *URINARY PROBLEMS  *BOWEL PROBLEMS  UNUSUAL RASH Items with * indicate a potential emergency and should be followed up as soon as possible.  Feel free to call the clinic should you have any questions or concerns. The clinic phone number is [phone].  Please show the CHEMO ALERT CARD at check-in to the Emergency Department and triage nurse.   

## 2016-08-23 NOTE — Progress Notes (Signed)
Hematology and Oncology Follow Up Visit  Frank Larsen [id number] October 31, 1945 71 y.o. 08/23/2016 8:44 AM Frank Larsen Frank Larsen, Frank Brow, MD   Principle Diagnosis: 71 year old gentleman with prostate cancer diagnosed in 2013 after presenting with a PSA of 3.6 and a Gleason score 5+4 = 9. He has peritoneal carcinomatosis biopsy proven to be adenocarcinoma likely of a prostate primary.   Prior Therapy: He is status post robotic-assisted laparoscopic radical prostatectomy and extended lymphadenectomy with the pathology reveals T2N0.  completed in June 2013.  Current therapy: Taxotere chemotherapy at 75 mg/m every 3 weeks started on 03/30/2016. He is here for cycle 8 of therapy.  Interim History: Frank Larsen presents today for a follow-up visit. Since the last visit, he reports increase in his lower extremity edema. This was noted to be bilateral in nature without any muscle cramping or difficulty breathing. He is reporting also more symptoms of fatigue and tiredness associated with the last cycle of chemotherapy. His fatigue resolves after 2 weeks of chemotherapy. He is eating reasonably well and performance status remains unchanged.  He denied any abdominal pain, nausea or vomiting. He denied any peripheral neuropathy or infusion related complications. He continues to attend to activities of daily living without any decline.   He does not report any headaches, blurry vision, syncope or seizures. He does not report any fevers, chills, sweats. He does not report any chest pain, palpitation, orthopnea or leg edema. He does not report any cough, wheezing or hemoptysis. He does not report any hematochezia, melena or diarrhea at this time. He has not moved his bowels last few days. He does not report any frequency, urgency or hematuria. He does not report any skeletal complaints. Remaining review of systems and   Medications: I have reviewed the patient's current medications.  Current Outpatient  Prescriptions  Medication Sig Dispense Refill  . atorvastatin (LIPITOR) 10 MG tablet Take 10 mg by mouth daily with breakfast.     . Blood Glucose Monitoring Suppl (ONE TOUCH ULTRA 2) w/Device KIT Use to check blood sugar 2 times per day. 1 each 2  . glipiZIDE (GLUCOTROL XL) 5 MG 24 hr tablet Take 1 tablet (5 mg total) by mouth daily with breakfast. 90 tablet 2  . glucose blood (ONE TOUCH ULTRA TEST) test strip Use to check blood sugar 2 times per day. 200 each 5  . HYDROcodone-acetaminophen (NORCO/VICODIN) 5-325 MG tablet Take 1-2 tablets by mouth every 6 (six) hours as needed for moderate pain. 20 tablet 0  . HYDROcodone-homatropine (HYCODAN) 5-1.5 MG/5ML syrup 5 ML AS NEEDED EVERY 6 HRS ORALLY  0  . lidocaine-prilocaine (EMLA) cream Apply 1 application topically as needed. Apply to port before chemotherapy. 30 g 0  . metFORMIN (GLUCOPHAGE) 1000 MG tablet Take 1 tablet (1,000 mg total) by mouth 2 (two) times daily with a meal. 180 tablet 3  . Multiple Vitamin (MULTIVITAMIN) tablet Take 1 tablet by mouth daily.    [name] omeprazole (PRILOSEC) 20 MG capsule Take 20 mg by mouth every morning.    Frank Larsen DELICA LANCETS 94I MISC Use to check blood sugar 2 times per day. 200 each 2  . pioglitazone (ACTOS) 30 MG tablet Take 1 tablet (30 mg total) by mouth daily. 90 tablet 3  . prochlorperazine (COMPAZINE) 10 MG tablet Take 1 tablet (10 mg total) by mouth every 6 (six) hours as needed for nausea or vomiting. 30 tablet 0  . pseudoephedrine-acetaminophen (TYLENOL SINUS) 30-500 MG TABS tablet Take 1 tablet by mouth every  4 (four) hours as needed (congestion).      No current facility-administered medications for this visit.      Allergies:  Allergies  Allergen Reactions  . Scallops [Shellfish Allergy] Nausea And Vomiting  . Cephalexin Hives and Rash    Past Medical History, Surgical history, Social history, and Family History were reviewed and updated.   Physical Exam: Blood pressure (!) 117/52,  pulse 77, temperature 97.6 F (36.4 C), temperature source Oral, resp. rate 18, height _0  (1.778 m), weight 200 lb 9.6 oz (91 kg), SpO2 99 %. ECOG: 1 General appearance: Alert, awake gentleman appeared without distress. Head: Normocephalic, without obvious abnormality no oral thrush or ulcers. Neck: no adenopathy Lymph nodes: Cervical, supraclavicular, and axillary nodes normal. Heart:regular rate and rhythm, S1, S2 normal, no murmur, click, rub or gallop Lung:chest clear, no wheezing, rales, normal symmetric air entry Abdomin: soft, without masses or organomegaly. No shifting dullness or ascites.. EXT: Bilateral lower extremity edema 1+.   Lab Results: Lab Results  Component Value Date   WBC 6.8 08/23/2016   HGB 9.0 (L) 08/23/2016   HCT 28.4 (L) 08/23/2016   MCV 80.7 08/23/2016   PLT 499 (H) 08/23/2016     Chemistry      Component Value Date/Time   NA 141 08/02/2016 0743   K 4.3 08/02/2016 0743   CL 102 03/05/2016 1459   CO2 21 (L) 08/02/2016 0743   BUN 11.7 08/02/2016 0743   CREATININE 0.8 08/02/2016 0743   GLU 155 08/24/2015      Component Value Date/Time   CALCIUM 8.9 08/02/2016 0743   ALKPHOS 44 08/02/2016 0743   AST 15 08/02/2016 0743   ALT 10 08/02/2016 0743   BILITOT 0.24 08/02/2016 0743       Results for Frank Larsen (MRN [id number]) as of 08/23/2016 08:20  Ref. Range 06/21/2016 07:37 07/12/2016 07:37 08/02/2016 07:43  PSA Latest Ref Range: 0.0 - 4.0 ng/mL <0.1 <0.1 <0.1      Impression and Plan:   71 year old gentleman with the following issues:  1. 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 This was noted after presenting with symptoms of nausea, diarrhea and vomiting. He reports poor by mouth intake and weight loss. He does have history of prostate cancer Gleason score 5+4 = 9 resected in 2013.  He is status post biopsy obtained on 03/14/2016 and the final pathology showed metastatic carcinoma that is poorly differentiated  that favors prostate cancer. Although the specimen did not stain for PSA his tumor is poorly differentiated likely secretes very little to no PSA. His last PSA was 1.3.  He is currently receiving Taxotere salvage chemotherapy started on 03/30/2016.   He received 7 cycles of therapy without any major complications. CT scan on 06/28/2016  showed excellent response to therapy. There is no clear-cut residual disease that was noted.   He is ready to proceed with cycle 8 of chemotherapy with plans for total of 10 cycles.  2. Abdominal pain: Resolved at this time. He is not taking any pain medication.  3. Antiemetics: Nausea and vomiting has not been a major issue for him.  4. IV access: Port-A-Cath placed and reports no recent complications.  5. Neutropenia prophylaxis: He will receive it after each treatment.  6. Lower extremity edema: This is related to Taxotere chemotherapy. I doubt that DVT would be the cause at this time given the bilateral nature of his edema. I have asked him to elevate his lower extremities as  much as possible and his compression stockings. We have also discussed Lasix if needed to if his edema worsens.  7. Follow-up: Will be in 3 weeks for cycle 9 of chemotherapy.    [facility], MD 4/17/20188:44 AM

## 2016-08-23 NOTE — Patient Instructions (Signed)

## 2016-08-24 LAB — PSA

## 2016-08-25 ENCOUNTER — Ambulatory Visit (HOSPITAL_BASED_OUTPATIENT_CLINIC_OR_DEPARTMENT_OTHER): Payer: PPO

## 2016-08-25 VITALS — BP 109/87 | HR 87 | Temp 97.9°F | Resp 20

## 2016-08-25 DIAGNOSIS — Z5189 Encounter for other specified aftercare: Secondary | ICD-10-CM

## 2016-08-25 DIAGNOSIS — C61 Malignant neoplasm of prostate: Secondary | ICD-10-CM

## 2016-08-25 MED ORDER — PEGFILGRASTIM INJECTION 6 MG/0.6ML ~~LOC~~
6.0000 mg | PREFILLED_SYRINGE | Freq: Once | SUBCUTANEOUS | Status: AC
  Administered 2016-08-25: 6 mg via SUBCUTANEOUS
  Filled 2016-08-25: qty 0.6

## 2016-08-25 NOTE — Patient Instructions (Signed)
Pegfilgrastim injection What is this medicine? PEGFILGRASTIM (PEG fil gra stim) is a long-acting granulocyte colony-stimulating factor that stimulates the growth of neutrophils, a type of white blood cell important in the body's fight against infection. It is used to reduce the incidence of fever and infection in patients with certain types of cancer who are receiving chemotherapy that affects the bone marrow, and to increase survival after being exposed to high doses of radiation. This medicine may be used for other purposes; ask your health care provider or pharmacist if you have questions. COMMON BRAND NAME(S): Neulasta What should I tell my health care provider before I take this medicine? They need to know if you have any of these conditions: -kidney disease -latex allergy -ongoing radiation therapy -sickle cell disease -skin reactions to acrylic adhesives (On-Body Injector only) -an unusual or allergic reaction to pegfilgrastim, filgrastim, other medicines, foods, dyes, or preservatives -pregnant or trying to get pregnant -breast-feeding How should I use this medicine? This medicine is for injection under the skin. If you get this medicine at home, you will be taught how to prepare and give the pre-filled syringe or how to use the On-body Injector. Refer to the patient Instructions for Use for detailed instructions. Use exactly as directed. Take your medicine at regular intervals. Do not take your medicine more often than directed. It is important that you put your used needles and syringes in a special sharps container. Do not put them in a trash can. If you do not have a sharps container, call your pharmacist or healthcare provider to get one. Talk to your pediatrician regarding the use of this medicine in children. While this drug may be prescribed for selected conditions, precautions do apply. Overdosage: If you think you have taken too much of this medicine contact a poison control  center or emergency room at once. NOTE: This medicine is only for you. Do not share this medicine with others. What if I miss a dose? It is important not to miss your dose. Call your doctor or health care professional if you miss your dose. If you miss a dose due to an On-body Injector failure or leakage, a new dose should be administered as soon as possible using a single prefilled syringe for manual use. What may interact with this medicine? Interactions have not been studied. Give your health care provider a list of all the medicines, herbs, non-prescription drugs, or dietary supplements you use. Also tell them if you smoke, drink alcohol, or use illegal drugs. Some items may interact with your medicine. This list may not describe all possible interactions. Give your health care provider a list of all the medicines, herbs, non-prescription drugs, or dietary supplements you use. Also tell them if you smoke, drink alcohol, or use illegal drugs. Some items may interact with your medicine. What should I watch for while using this medicine? You may need blood work done while you are taking this medicine. If you are going to need a MRI, CT scan, or other procedure, tell your doctor that you are using this medicine (On-Body Injector only). What side effects may I notice from receiving this medicine? Side effects that you should report to your doctor or health care professional as soon as possible: -allergic reactions like skin rash, itching or hives, swelling of the face, lips, or tongue -dizziness -fever -pain, redness, or irritation at site where injected -pinpoint red spots on the skin -red or dark-brown urine -shortness of breath or breathing problems -stomach or   side pain, or pain at the shoulder -swelling -tiredness -trouble passing urine or change in the amount of urine Side effects that usually do not require medical attention (report to your doctor or health care professional if they  continue or are bothersome): -bone pain -muscle pain This list may not describe all possible side effects. Call your doctor for medical advice about side effects. You may report side effects to FDA at 1-800-FDA-1088. Where should I keep my medicine? Keep out of the reach of children. Store pre-filled syringes in a refrigerator between 2 and 8 degrees C (36 and 46 degrees F). Do not freeze. Keep in carton to protect from light. Throw away this medicine if it is left out of the refrigerator for more than 48 hours. Throw away any unused medicine after the expiration date. NOTE: This sheet is a summary. It may not cover all possible information. If you have questions about this medicine, talk to your doctor, pharmacist, or health care provider.  2017 Elsevier/Gold Standard (2014-05-15 14:30:14)  

## 2016-09-05 ENCOUNTER — Telehealth: Payer: Self-pay

## 2016-09-05 DIAGNOSIS — I10 Essential (primary) hypertension: Secondary | ICD-10-CM | POA: Diagnosis not present

## 2016-09-05 DIAGNOSIS — C786 Secondary malignant neoplasm of retroperitoneum and peritoneum: Secondary | ICD-10-CM | POA: Diagnosis not present

## 2016-09-05 DIAGNOSIS — E1165 Type 2 diabetes mellitus with hyperglycemia: Secondary | ICD-10-CM | POA: Diagnosis not present

## 2016-09-05 DIAGNOSIS — C61 Malignant neoplasm of prostate: Secondary | ICD-10-CM | POA: Diagnosis not present

## 2016-09-05 DIAGNOSIS — K219 Gastro-esophageal reflux disease without esophagitis: Secondary | ICD-10-CM | POA: Diagnosis not present

## 2016-09-05 DIAGNOSIS — E782 Mixed hyperlipidemia: Secondary | ICD-10-CM | POA: Diagnosis not present

## 2016-09-05 DIAGNOSIS — F439 Reaction to severe stress, unspecified: Secondary | ICD-10-CM | POA: Diagnosis not present

## 2016-09-05 DIAGNOSIS — R609 Edema, unspecified: Secondary | ICD-10-CM | POA: Diagnosis not present

## 2016-09-05 NOTE — Telephone Encounter (Signed)
Patient called in stated that he went to see his PCP today as a follow up exam, he has been having some left and right leg swelling the left being bigger than the right. The oncoligst that he sees looked at it as well when he went for his chemo treatment and believed it to come from his chemo, when he seen his PCP today they did an ultrasound to check for blood clots, which there was none. But the PCP believes the mix of the actos and the chemo is causing his legs to swell severely. With your permission PCP is asking to change from Actos. Please advise. Thank you!

## 2016-09-06 ENCOUNTER — Telehealth: Payer: Self-pay

## 2016-09-06 NOTE — Telephone Encounter (Signed)
Yes, no problem, but please let me know to contact me if he sugars started to increase.

## 2016-09-06 NOTE — Telephone Encounter (Signed)
Called and notified patient regarding the note from [place] to stop Actos. Patient understood and will call if sugars begin to increase.

## 2016-09-06 NOTE — Telephone Encounter (Signed)
Did you want to replace the medication with anything else. Or to tell him to stop taking it? Thank you!

## 2016-09-06 NOTE — Telephone Encounter (Signed)
His HbA1c is <7 >> we can just stop for now and follow the sugars. Advise him to keep an eye on his diet.

## 2016-09-06 NOTE — Telephone Encounter (Signed)
Called and notified patient regarding the note from [place] to stop Actos. Patient understood and will call if sugars begin to increase.

## 2016-09-13 ENCOUNTER — Ambulatory Visit (HOSPITAL_BASED_OUTPATIENT_CLINIC_OR_DEPARTMENT_OTHER): Payer: PPO | Admitting: Oncology

## 2016-09-13 ENCOUNTER — Ambulatory Visit: Payer: PPO

## 2016-09-13 ENCOUNTER — Other Ambulatory Visit (HOSPITAL_BASED_OUTPATIENT_CLINIC_OR_DEPARTMENT_OTHER): Payer: PPO

## 2016-09-13 ENCOUNTER — Ambulatory Visit (HOSPITAL_BASED_OUTPATIENT_CLINIC_OR_DEPARTMENT_OTHER): Payer: PPO

## 2016-09-13 ENCOUNTER — Telehealth: Payer: Self-pay | Admitting: Oncology

## 2016-09-13 VITALS — BP 130/52 | HR 81 | Temp 98.6°F | Resp 16 | Wt 201.6 lb

## 2016-09-13 DIAGNOSIS — C786 Secondary malignant neoplasm of retroperitoneum and peritoneum: Secondary | ICD-10-CM | POA: Diagnosis not present

## 2016-09-13 DIAGNOSIS — C61 Malignant neoplasm of prostate: Secondary | ICD-10-CM

## 2016-09-13 DIAGNOSIS — Z5111 Encounter for antineoplastic chemotherapy: Secondary | ICD-10-CM

## 2016-09-13 DIAGNOSIS — Z95828 Presence of other vascular implants and grafts: Secondary | ICD-10-CM

## 2016-09-13 LAB — COMPREHENSIVE METABOLIC PANEL
ALK PHOS: 48 U/L (ref 40–150)
ALT: 13 U/L (ref 0–55)
AST: 16 U/L (ref 5–34)
Albumin: 3.1 g/dL — ABNORMAL LOW (ref 3.5–5.0)
Anion Gap: 9 mEq/L (ref 3–11)
BUN: 18.7 mg/dL (ref 7.0–26.0)
CALCIUM: 9 mg/dL (ref 8.4–10.4)
CO2: 23 mEq/L (ref 22–29)
Chloride: 109 mEq/L (ref 98–109)
Creatinine: 0.8 mg/dL (ref 0.7–1.3)
EGFR: 88 mL/min/{1.73_m2} — AB (ref >90)
Glucose: 163 mg/dl — ABNORMAL HIGH (ref 70–140)
Potassium: 4.4 mEq/L (ref 3.5–5.1)
SODIUM: 141 meq/L (ref 136–145)
Total Bilirubin: 0.3 mg/dL (ref 0.20–1.20)
Total Protein: 5.6 g/dL — ABNORMAL LOW (ref 6.4–8.3)

## 2016-09-13 LAB — CBC WITH DIFFERENTIAL/PLATELET
BASO%: 1.1 % (ref 0.0–2.0)
Basophils Absolute: 0.1 10*3/uL (ref 0.0–0.1)
EOS%: 0.2 % (ref 0.0–7.0)
Eosinophils Absolute: 0 10*3/uL (ref 0.0–0.5)
HCT: 28.1 % — ABNORMAL LOW (ref 38.4–49.9)
HGB: 8.9 g/dL — ABNORMAL LOW (ref 13.0–17.1)
LYMPH%: 7 % — ABNORMAL LOW (ref 14.0–49.0)
MCH: 25.5 pg — ABNORMAL LOW (ref 27.2–33.4)
MCHC: 31.6 g/dL — ABNORMAL LOW (ref 32.0–36.0)
MCV: 80.7 fL (ref 79.3–98.0)
MONO#: 0.9 10*3/uL (ref 0.1–0.9)
MONO%: 9.8 % (ref 0.0–14.0)
NEUT#: 7.4 10*3/uL — ABNORMAL HIGH (ref 1.5–6.5)
NEUT%: 81.9 % — ABNORMAL HIGH (ref 39.0–75.0)
Platelets: 490 10*3/uL — ABNORMAL HIGH (ref 140–400)
RBC: 3.48 10*6/uL — ABNORMAL LOW (ref 4.20–5.82)
RDW: 21.6 % — ABNORMAL HIGH (ref 11.0–14.6)
WBC: 9 10*3/uL (ref 4.0–10.3)
lymph#: 0.6 10*3/uL — ABNORMAL LOW (ref 0.9–3.3)

## 2016-09-13 MED ORDER — SODIUM CHLORIDE 0.9% FLUSH
10.0000 mL | INTRAVENOUS | Status: DC | PRN
  Administered 2016-09-13: 10 mL
  Filled 2016-09-13: qty 10

## 2016-09-13 MED ORDER — HEPARIN SOD (PORK) LOCK FLUSH 100 UNIT/ML IV SOLN
500.0000 [IU] | Freq: Once | INTRAVENOUS | Status: AC | PRN
  Administered 2016-09-13: 500 [IU]
  Filled 2016-09-13: qty 5

## 2016-09-13 MED ORDER — SODIUM CHLORIDE 0.9 % IV SOLN
10.0000 mg | Freq: Once | INTRAVENOUS | Status: AC
  Administered 2016-09-13: 10 mg via INTRAVENOUS
  Filled 2016-09-13: qty 1

## 2016-09-13 MED ORDER — DEXAMETHASONE SODIUM PHOSPHATE 10 MG/ML IJ SOLN: 10.0000 mg | Freq: Once | INTRAMUSCULAR | Status: DC

## 2016-09-13 MED ORDER — SODIUM CHLORIDE 0.9 % IV SOLN
Freq: Once | INTRAVENOUS | Status: AC
  Administered 2016-09-13: 09:00:00 via INTRAVENOUS

## 2016-09-13 MED ORDER — SODIUM CHLORIDE 0.9% FLUSH
10.0000 mL | INTRAVENOUS | Status: DC | PRN
  Administered 2016-09-13: 10 mL via INTRAVENOUS
  Filled 2016-09-13: qty 10

## 2016-09-13 MED ORDER — SODIUM CHLORIDE 0.9 % IV SOLN
60.0000 mg/m2 | Freq: Once | INTRAVENOUS | Status: AC
  Administered 2016-09-13: 120 mg via INTRAVENOUS
  Filled 2016-09-13: qty 12

## 2016-09-13 NOTE — Patient Instructions (Signed)
[facility] Discharge Instructions for Patients Receiving Chemotherapy  Today you received the following chemotherapy agents:  Taxotere.  To help prevent nausea and vomiting after your treatment, we encourage you to take your nausea medication as directed.   If you develop nausea and vomiting that is not controlled by your nausea medication, call the clinic.   BELOW ARE SYMPTOMS THAT SHOULD BE REPORTED IMMEDIATELY:  *FEVER GREATER THAN 100.5 F  *CHILLS WITH OR WITHOUT FEVER  NAUSEA AND VOMITING THAT IS NOT CONTROLLED WITH YOUR NAUSEA MEDICATION  *UNUSUAL SHORTNESS OF BREATH  *UNUSUAL BRUISING OR BLEEDING  TENDERNESS IN MOUTH AND THROAT WITH OR WITHOUT PRESENCE OF ULCERS  *URINARY PROBLEMS  *BOWEL PROBLEMS  UNUSUAL RASH Items with * indicate a potential emergency and should be followed up as soon as possible.  Feel free to call the clinic you have any questions or concerns. The clinic phone number is [phone].  Please show the CHEMO ALERT CARD at check-in to the Emergency Department and triage nurse.  

## 2016-09-13 NOTE — Telephone Encounter (Signed)
No additional appts need - just ct to be scheduled. - [facility] to contact patient with Ct schedule.

## 2016-09-13 NOTE — Progress Notes (Signed)
Hematology and Oncology Follow Up Visit  Frank Larsen [id number] 1945/07/27 71 y.o. 09/13/2016 8:56 AM [name], [name], MD   Principle Diagnosis: 71 year old gentleman with prostate cancer diagnosed in 2013 after presenting with a PSA of 3.6 and a Gleason score 5+4 = 9. He has peritoneal carcinomatosis biopsy proven to be adenocarcinoma likely of a prostate primary.   Prior Therapy: He is status post robotic-assisted laparoscopic radical prostatectomy and extended lymphadenectomy with the pathology reveals T2N0.  completed in June 2013.  Current therapy: Taxotere chemotherapy at 75 mg/m every 3 weeks started on 03/30/2016. He is here for cycle 9 of therapy. He'll receive 60 mg/m starting with cycle 9 of therapy.  Interim History: Frank Larsen presents today for a follow-up visit. Since the last visit, he reports no major changes in his health. He received her last cycle of chemotherapy was slightly increased fatigue. His fatigue is lasting longer well into his third week. His lower extremity edema persists at this time although it has not dramatically changed. He still able to drive and attends to activities of daily living. He denied any shortness of breath or upper extremity swelling. Lower extremity Dopplers ruled out DVT. He denied any abdominal pain, nausea or vomiting. He denied any peripheral neuropathy or infusion related complications.    He does not report any headaches, blurry vision, syncope or seizures. He does not report any fevers, chills, sweats. He does not report any chest pain, palpitation, orthopnea or leg edema. He does not report any cough, wheezing or hemoptysis. He does not report any hematochezia, melena or diarrhea at this time. He has not moved his bowels last few days. He does not report any frequency, urgency or hematuria. He does not report any skeletal complaints. Remaining review of systems and   Medications: I have reviewed the patient's current  medications.  Current Outpatient Prescriptions  Medication Sig Dispense Refill  . atorvastatin (LIPITOR) 10 MG tablet Take 10 mg by mouth daily with breakfast.     . Blood Glucose Monitoring Suppl (ONE TOUCH ULTRA 2) w/Device KIT Use to check blood sugar 2 times per day. 1 each 2  . glipiZIDE (GLUCOTROL XL) 5 MG 24 hr tablet Take 1 tablet (5 mg total) by mouth daily with breakfast. 90 tablet 2  . glucose blood (ONE TOUCH ULTRA TEST) test strip Use to check blood sugar 2 times per day. 200 each 5  . HYDROcodone-acetaminophen (NORCO/VICODIN) 5-325 MG tablet Take 1-2 tablets by mouth every 6 (six) hours as needed for moderate pain. 20 tablet 0  . HYDROcodone-homatropine (HYCODAN) 5-1.5 MG/5ML syrup 5 ML AS NEEDED EVERY 6 HRS ORALLY  0  . lidocaine-prilocaine (EMLA) cream Apply 1 application topically as needed. Apply to port before chemotherapy. 30 g 0  . metFORMIN (GLUCOPHAGE) 1000 MG tablet Take 1 tablet (1,000 mg total) by mouth 2 (two) times daily with a meal. 180 tablet 3  . Multiple Vitamin (MULTIVITAMIN) tablet Take 1 tablet by mouth daily.    [name] omeprazole (PRILOSEC) 20 MG capsule Take 20 mg by mouth every morning.    Frank Larsen DELICA LANCETS 86V MISC Use to check blood sugar 2 times per day. 200 each 2  . prochlorperazine (COMPAZINE) 10 MG tablet Take 1 tablet (10 mg total) by mouth every 6 (six) hours as needed for nausea or vomiting. 30 tablet 0  . pseudoephedrine-acetaminophen (TYLENOL SINUS) 30-500 MG TABS tablet Take 1 tablet by mouth every 4 (four) hours as needed (congestion).     [name]  triamcinolone cream (KENALOG) 0.1 %      No current facility-administered medications for this visit.      Allergies:  Allergies  Allergen Reactions  . Scallops [Shellfish Allergy] Nausea And Vomiting  . Cephalexin Hives and Rash    Past Medical History, Surgical history, Social history, and Family History were reviewed and updated.   Physical Exam: Blood pressure (!) 130/52, pulse 81,  temperature 98.6 F (37 C), temperature source Oral, resp. rate 16, weight 201 lb 9 oz (91.4 kg), SpO2 99 %. ECOG: 1 General appearance: Well-appearing gentleman appeared without distress. Head: Normocephalic, without obvious abnormality no oral ulcers or lesions. Neck: no adenopathy Lymph nodes: Cervical, supraclavicular, and axillary nodes normal. Heart:regular rate and rhythm, S1, S2 normal, no murmur, click, rub or gallop Lung:chest clear, no wheezing, rales, normal symmetric air entry Abdomin: soft, without masses or organomegaly. No rebound or guarding. EXT: Bilateral lower extremity edema 1+.   Lab Results: Lab Results  Component Value Date   WBC 9.0 09/13/2016   HGB 8.9 (L) 09/13/2016   HCT 28.1 (L) 09/13/2016   MCV 80.7 09/13/2016   PLT 490 (H) 09/13/2016     Chemistry      Component Value Date/Time   NA 141 09/13/2016 0753   K 4.4 09/13/2016 0753   CL 102 03/05/2016 1459   CO2 23 09/13/2016 0753   BUN 18.7 09/13/2016 0753   CREATININE 0.8 09/13/2016 0753   GLU 155 08/24/2015      Component Value Date/Time   CALCIUM 9.0 09/13/2016 0753   ALKPHOS 48 09/13/2016 0753   AST 16 09/13/2016 0753   ALT 13 09/13/2016 0753   BILITOT 0.30 09/13/2016 0753        Results for Frank Larsen, Frank (MRN [id number]) as of 09/13/2016 08:25  Ref. Range 07/12/2016 07:37 08/02/2016 07:43 08/23/2016 07:55  PSA Latest Ref Range: 0.0 - 4.0 ng/mL <0.1 <0.1 <0.1      Impression and Plan:   71 year old gentleman with the following issues:  1. 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 This was noted after presenting with symptoms of nausea, diarrhea and vomiting. He reports poor by mouth intake and weight loss. He does have history of prostate cancer Gleason score 5+4 = 9 resected in 2013.  He is status post biopsy obtained on 03/14/2016 and the final pathology showed metastatic carcinoma that is poorly differentiated that favors prostate cancer. Although the specimen  did not stain for PSA his tumor is poorly differentiated likely secretes very little to no PSA. His last PSA was 1.3.  He is currently receiving Taxotere salvage chemotherapy started on 03/30/2016.   He received 8 cycles of therapy with few complications. CT scan on 06/28/2016  showed excellent response to therapy. There is no clear-cut residual disease that was noted.   The plan is to proceed with cycle 9 of therapy at a reduced dose given his lower extremity edema and excessive fatigue. Cycle 10 will be given in 3 weeks if no worsening symptoms noted. I plan on repeat imaging studies before cycle 10 of therapy.  2. Abdominal pain: Resolved at this time. He is not taking any pain medication.  3. Antiemetics: Controlled at this time without complications.  4. IV access: Port-A-Cath placed and reports no recent complications.  5. Neutropenia prophylaxis: He will receive it after each treatment.  6. Lower extremity edema: This is related to Taxotere chemotherapy. Dose reduction and eventually discontinuation of Taxotere will help his symptoms.  7.  Follow-up: Will be in 3 weeks for cycle 10 of chemotherapy.    [facility], MD 5/8/20188:56 AM

## 2016-09-14 LAB — PSA: Prostate Specific Ag, Serum: <0.1 ng/mL (ref 0.0–4.0)

## 2016-09-15 ENCOUNTER — Ambulatory Visit (HOSPITAL_BASED_OUTPATIENT_CLINIC_OR_DEPARTMENT_OTHER): Payer: PPO

## 2016-09-15 VITALS — BP 125/56 | HR 83 | Temp 97.9°F | Resp 18

## 2016-09-15 DIAGNOSIS — C61 Malignant neoplasm of prostate: Secondary | ICD-10-CM

## 2016-09-15 DIAGNOSIS — Z5189 Encounter for other specified aftercare: Secondary | ICD-10-CM

## 2016-09-15 MED ORDER — PEGFILGRASTIM INJECTION 6 MG/0.6ML ~~LOC~~
6.0000 mg | PREFILLED_SYRINGE | Freq: Once | SUBCUTANEOUS | Status: AC
  Administered 2016-09-15: 6 mg via SUBCUTANEOUS
  Filled 2016-09-15: qty 0.6

## 2016-09-15 NOTE — Patient Instructions (Signed)
Pegfilgrastim injection What is this medicine? PEGFILGRASTIM (PEG fil gra stim) is a long-acting granulocyte colony-stimulating factor that stimulates the growth of neutrophils, a type of white blood cell important in the body's fight against infection. It is used to reduce the incidence of fever and infection in patients with certain types of cancer who are receiving chemotherapy that affects the bone marrow, and to increase survival after being exposed to high doses of radiation. This medicine may be used for other purposes; ask your health care provider or pharmacist if you have questions. COMMON BRAND NAME(S): Neulasta What should I tell my health care provider before I take this medicine? They need to know if you have any of these conditions: -kidney disease -latex allergy -ongoing radiation therapy -sickle cell disease -skin reactions to acrylic adhesives (On-Body Injector only) -an unusual or allergic reaction to pegfilgrastim, filgrastim, other medicines, foods, dyes, or preservatives -pregnant or trying to get pregnant -breast-feeding How should I use this medicine? This medicine is for injection under the skin. If you get this medicine at home, you will be taught how to prepare and give the pre-filled syringe or how to use the On-body Injector. Refer to the patient Instructions for Use for detailed instructions. Use exactly as directed. Take your medicine at regular intervals. Do not take your medicine more often than directed. It is important that you put your used needles and syringes in a special sharps container. Do not put them in a trash can. If you do not have a sharps container, call your pharmacist or healthcare provider to get one. Talk to your pediatrician regarding the use of this medicine in children. While this drug may be prescribed for selected conditions, precautions do apply. Overdosage: If you think you have taken too much of this medicine contact a poison control  center or emergency room at once. NOTE: This medicine is only for you. Do not share this medicine with others. What if I miss a dose? It is important not to miss your dose. Call your doctor or health care professional if you miss your dose. If you miss a dose due to an On-body Injector failure or leakage, a new dose should be administered as soon as possible using a single prefilled syringe for manual use. What may interact with this medicine? Interactions have not been studied. Give your health care provider a list of all the medicines, herbs, non-prescription drugs, or dietary supplements you use. Also tell them if you smoke, drink alcohol, or use illegal drugs. Some items may interact with your medicine. This list may not describe all possible interactions. Give your health care provider a list of all the medicines, herbs, non-prescription drugs, or dietary supplements you use. Also tell them if you smoke, drink alcohol, or use illegal drugs. Some items may interact with your medicine. What should I watch for while using this medicine? You may need blood work done while you are taking this medicine. If you are going to need a MRI, CT scan, or other procedure, tell your doctor that you are using this medicine (On-Body Injector only). What side effects may I notice from receiving this medicine? Side effects that you should report to your doctor or health care professional as soon as possible: -allergic reactions like skin rash, itching or hives, swelling of the face, lips, or tongue -dizziness -fever -pain, redness, or irritation at site where injected -pinpoint red spots on the skin -red or dark-brown urine -shortness of breath or breathing problems -stomach or   side pain, or pain at the shoulder -swelling -tiredness -trouble passing urine or change in the amount of urine Side effects that usually do not require medical attention (report to your doctor or health care professional if they  continue or are bothersome): -bone pain -muscle pain This list may not describe all possible side effects. Call your doctor for medical advice about side effects. You may report side effects to FDA at 1-800-FDA-1088. Where should I keep my medicine? Keep out of the reach of children. Store pre-filled syringes in a refrigerator between 2 and 8 degrees C (36 and 46 degrees F). Do not freeze. Keep in carton to protect from light. Throw away this medicine if it is left out of the refrigerator for more than 48 hours. Throw away any unused medicine after the expiration date. NOTE: This sheet is a summary. It may not cover all possible information. If you have questions about this medicine, talk to your doctor, pharmacist, or health care provider.  2017 Elsevier/Gold Standard (2014-05-15 14:30:14)  

## 2016-09-27 ENCOUNTER — Encounter (HOSPITAL_COMMUNITY): Payer: Self-pay

## 2016-09-27 ENCOUNTER — Ambulatory Visit (HOSPITAL_COMMUNITY)
Admission: RE | Admit: 2016-09-27 | Discharge: 2016-09-27 | Disposition: A | Discharge status: Home / Self Care | Payer: PPO | Source: Ambulatory Visit | Attending: Oncology | Admitting: Oncology

## 2016-09-27 DIAGNOSIS — J90 Pleural effusion, not elsewhere classified: Secondary | ICD-10-CM | POA: Diagnosis not present

## 2016-09-27 DIAGNOSIS — I2510 Atherosclerotic heart disease of native coronary artery without angina pectoris: Secondary | ICD-10-CM | POA: Diagnosis not present

## 2016-09-27 DIAGNOSIS — C61 Malignant neoplasm of prostate: Secondary | ICD-10-CM

## 2016-09-27 DIAGNOSIS — R180 Malignant ascites: Secondary | ICD-10-CM | POA: Diagnosis not present

## 2016-09-27 DIAGNOSIS — I700 Atherosclerosis of aorta: Secondary | ICD-10-CM | POA: Diagnosis not present

## 2016-09-27 DIAGNOSIS — R188 Other ascites: Secondary | ICD-10-CM | POA: Diagnosis not present

## 2016-09-27 MED ORDER — HEPARIN SOD (PORK) LOCK FLUSH 100 UNIT/ML IV SOLN
INTRAVENOUS | Status: AC
  Filled 2016-09-27: qty 5

## 2016-09-27 MED ORDER — IOPAMIDOL (ISOVUE-300) INJECTION 61%
INTRAVENOUS | Status: AC
  Administered 2016-09-27: 100 mL
  Filled 2016-09-27: qty 100

## 2016-09-28 ENCOUNTER — Ambulatory Visit (INDEPENDENT_AMBULATORY_CARE_PROVIDER_SITE_OTHER): Payer: PPO | Admitting: Internal Medicine

## 2016-09-28 ENCOUNTER — Encounter: Payer: Self-pay | Admitting: Internal Medicine

## 2016-09-28 VITALS — BP 124/68 | HR 85 | Ht 70.0 in | Wt 200.0 lb

## 2016-09-28 DIAGNOSIS — E1140 Type 2 diabetes mellitus with diabetic neuropathy, unspecified: Secondary | ICD-10-CM

## 2016-09-28 LAB — POCT GLYCOSYLATED HEMOGLOBIN (HGB A1C): HEMOGLOBIN A1C: 6.1

## 2016-09-28 NOTE — Patient Instructions (Signed)
Please continue: - Metformin 1000 mg 2x a day, with meals - Glipizide ER 5 mg daily in am, before b'fast  Please return in 4 months with your sugar log. 

## 2016-09-28 NOTE — Progress Notes (Signed)
Patient ID: Frank Larsen, male   DOB: 08-01-1945, 71 y.o.   MRN: [id number]  HPI: Frank Larsen is a 71 y.o.-year-old male, returning for f/u for DM2, dx in 2013, non-insulin-dependent, uncontrolled, with complications (PN). He saw Dr. Buddy Duty in the past. Last visit with me 3 mo ago.  He has a h/o of Pr CA metastases (previously dx'ed in 2013). On ChTx (had 9 tx's) - getting Dexamethasone with the txs.He had RxTx in 2015.   He developed swelling in feet and now thigh and arm, related to ChTx. He stopped Actos on 09/06/2016 to avoid further swelling. He had a leg U/S >> no DVT.   + rash  -generalized, pruritic >> sees dermatology >> improving.  Last hemoglobin A1c was: Lab Results  Component Value Date   HGBA1C 6.6 06/30/2016   HGBA1C 6.5 03/30/2016   HGBA1C 6.8 12/29/2015  02/17/2015: 7.7% 10/2014: 7.6%  Pt is on a regimen of: - Metformin 1000 mg 2x a day, with meals - Glipizide ER 5 mg daily in am, before b'fast - added 10/2015  He was on Actos 30 mg daily in a.m. - added in 2016 >> stopped 09/06/2016. Sugars did not significantly increase after stopping Actos. Ran out of Januvia 100 mg daily in a.m. >> not covered until the end of the year (doughnut hole)  Pt checks sugars 1x a day: - am: 106-164, 198, some 200s 2/2 pain >> 157-194, 216 >> 135, 164, 210 - 2h after b'fast: n/c >> 119-155 >> n/c >> 181, 184 >> 163, 166, 236 (1h after the meal) - before lunch: n/c >> 127-152 >> 114-151, 220 >> 174, 255 >> 93-156 - 2h after lunch: n/c >> 127-167 >> 127, 144 >> 149, 253 >> 124, 161 - before dinner: n/c >> 96-142 >> 114, 205 (pain) >> 112-144 >> 101-149 - 2h after dinner: n/c >> 98, 121-181 >> 85, 99 >> 171-198 >> 144, 153 - bedtime: n/c - nighttime: n/c No lows. Lowest sugar was 119 >> 96 >> 70 (took Glipizide and did not eat) >> 112 >> 93;  he has hypoglycemia awareness at 70.  Highest sugar was 204 >> 224 >> 200s >> 253 >> 236  Glucometer: One Touch Ultra  - No CKD, last  BUN/creatinine:  Lab Results  Component Value Date   BUN 18.7 09/13/2016   BUN 16.8 08/23/2016   CREATININE 0.8 09/13/2016   CREATININE 0.8 08/23/2016  08/24/2015: 14/1.01, EGFR 73 He had increased microalbumin in the past and started losartan on 02/17/2015. - last set of lipids - at goal: 08/24/2015: 141/134/41/74 - to be repeated by PCP in 11/2016 On Atorvastatin. - last eye exam was 05/2015. No DR. Will go back in June. - no numbness, but has tingling in his feet. + foot exam in 08/2015 >> normal sensation to manofilament  He also has a h/o Melanoma. He also has HTN, HL.  He will go in a cruise in the fall and another one next Spring.  ROS: Constitutional: no weight gain/no weight loss, + fatigue, no subjective hyperthermia, no subjective hypothermia, + Nocturia Eyes: + blurry vision, no xerophthalmia ENT: no sore throat, no nodules palpated in throat, no dysphagia, no odynophagia, no hoarseness Cardiovascular: no CP/no SOB/no palpitations/+ leg swelling Respiratory: no cough/no SOB/no wheezing Gastrointestinal: no N/no V/no D/no C/no acid reflux Musculoskeletal: + muscle aches/no joint aches Skin: + rash, + itching, + hair loss Neurological: no tremors/no numbness/no tingling/no dizziness  I reviewed pt's medications, allergies, PMH, social  hx, family hx, and changes were documented in the history of present illness. Otherwise, unchanged from my initial visit note.   Past Medical History:  Diagnosis Date  . AK (actinic keratosis)   . Arthritis   . BPH (benign prostatic hyperplasia)   . Chronic low back pain    since 1979  . Diabetes mellitus ORAL MED   type 2  . Elevated PSA   . GERD (gastroesophageal reflux disease)   . Hearing loss    using bilateral hearing aids  . History of radiation therapy 01/11/14- 03/04/14   prostate bed 6600 cGy 33 sessions  . Hypercholesterolemia   . Hypercholesterolemia   . Hyperlipidemia   . Impaired hearing BILATERAL HEARING AIDS    20% RIGHT HEARING DUE TO VIRUS  . Melanoma ([place])   . Normal cardiac stress test 2010  . Prostate cancer ([place]) 07/20/11   Gleason 9  . Prostate cancer ([place])   . Prostate nodule   . Skin cancer    malignant melanoma of skin   . Tinnitus    stable since 1992  . Tobacco use    Past Surgical History:  Procedure Laterality Date  . APPENDECTOMY  AGE 37  . CARPOMETACARPAL JOINT ARTHROTOMY  03-22-2006   RIGHT THUMB  . CARPOMETACARPAL JOINT ARTHROTOMY  12-21-2005   LEFT THUMB  . IR GENERIC HISTORICAL  03/22/2016   IR US GUIDE VASC ACCESS RIGHT [facility]  . IR GENERIC HISTORICAL  03/22/2016   IR FLUORO GUIDE PORT INSERTION RIGHT [facility]  . MELANOMA EXCISION  07-12-10   RIGHT NECK  . PROSTATE BIOPSY  07/20/2011   Procedure: BIOPSY TRANSRECTAL ULTRASONIC PROSTATE (TUBP);  Surgeon: Molli Hazard, MD;  Location: [facility];  Service: Urology;  Laterality: N/A;  1 hour requested for this case  Saturation BX  OFFICE TO BRING ULTRASOUND MACHINE    . ROBOT ASSISTED LAPAROSCOPIC RADICAL PROSTATECTOMY  10/19/2011   Procedure: ROBOTIC ASSISTED LAPAROSCOPIC RADICAL PROSTATECTOMY;  Surgeon: Molli Hazard, MD;  Location: [facility];  Service: Urology;  Laterality: N/A;      . SHOULDER OPEN ROTATOR CUFF REPAIR  07-29-2010- RIGHT   AND SUBACROMIAL DECOMPRESSION AROMIOPLASTY  . TONSILLECTOMY  AGE 89   Social History   Social History  . Marital Status: Married    Spouse Name: N/A  . Number of Children: 2   Occupational History  . Retired     Public relations account executive   Social History Main Topics  . Smoking status: Former Smoker -- 1.00 packs/day for 43 years    Types: Cigarettes    Quit date: 12/07/2007  . Smokeless tobacco: Never Used  . Alcohol Use: Yes     Comment: OCCASIONAL  . Drug Use: No   Current Outpatient Prescriptions on File Prior to Visit  Medication Sig Dispense Refill  . atorvastatin (LIPITOR) 10 MG tablet Take 10 mg by mouth daily with  breakfast.     . Blood Glucose Monitoring Suppl (ONE TOUCH ULTRA 2) w/Device KIT Use to check blood sugar 2 times per day. 1 each 2  . glipiZIDE (GLUCOTROL XL) 5 MG 24 hr tablet Take 1 tablet (5 mg total) by mouth daily with breakfast. 90 tablet 2  . glucose blood (ONE TOUCH ULTRA TEST) test strip Use to check blood sugar 2 times per day. 200 each 5  . lidocaine-prilocaine (EMLA) cream Apply 1 application topically as needed. Apply to port before chemotherapy. 30 g 0  . metFORMIN (GLUCOPHAGE) 1000 MG  tablet Take 1 tablet (1,000 mg total) by mouth 2 (two) times daily with a meal. 180 tablet 3  . Multiple Vitamin (MULTIVITAMIN) tablet Take 1 tablet by mouth daily.    [name] omeprazole (PRILOSEC) 20 MG capsule Take 20 mg by mouth every morning.    [name] DELICA LANCETS 61Y MISC Use to check blood sugar 2 times per day. 200 each 2  . prochlorperazine (COMPAZINE) 10 MG tablet Take 1 tablet (10 mg total) by mouth every 6 (six) hours as needed for nausea or vomiting. 30 tablet 0  . pseudoephedrine-acetaminophen (TYLENOL SINUS) 30-500 MG TABS tablet Take 1 tablet by mouth every 4 (four) hours as needed (congestion).     . triamcinolone cream (KENALOG) 0.1 %      No current facility-administered medications on file prior to visit.    Allergies  Allergen Reactions  . Scallops [Shellfish Allergy] Nausea And Vomiting  . Cephalexin Hives and Rash   Family History  Problem Relation Age of Onset  . Cancer Mother        breast/lived 78 years post  . Alzheimer's disease Mother        deceased  . Cancer Brother        thyroid  . Heart disease Brother   . Stroke Father        4 years old  . Hypertension Father   . Alcoholism Father   . CVA Father   . Cirrhosis Paternal Grandfather        alcohol-related  . Cancer Paternal Grandmother        unknown type    PE: BP 124/68 (BP Location: Left Arm, Patient Position: Sitting)   Pulse 85   Ht 5' 10" (1.778 m)   Wt 200 lb (90.7 kg)   SpO2 95%   BMI  28.70 kg/m   Wt Readings from Last 3 Encounters:  09/28/16 200 lb (90.7 kg)  09/13/16 201 lb 9 oz (91.4 kg)  08/23/16 200 lb 9.6 oz (91 kg)   Constitutional: overweight, in NAD, very pale Eyes: PERRLA, EOMI, no exophthalmos ENT: moist mucous membranes, no thyromegaly, no cervical lymphadenopathy Cardiovascular: RRR, No MRG, + bilateral lower extremity edema to above Knees, left arm mild edema Respiratory: CTA B Gastrointestinal: abdomen soft, NT, ND, BS+ Musculoskeletal: no deformities, strength intact in all 4 Skin: moist, warm, no rashes Neurological: no tremor with outstretched hands, DTR normal in all 4  ASSESSMENT: 1. DM2, non-insulin-dependent, uncontrolled, with complications - PN  PLAN:  1. Patient with long-standing, uncontrolled diabetes, on oral antidiabetic regimen. He continues to be on chemotherapy for  His metastatic prostate cancer. He had 9 chemotherapy treatments and a restaging CT scan yesterday. He was not called with the results yet. - We stopped Actos at the beginning of this month (3 weeks ago) due to his swelling. The swelling did not significantly change after worse, but as his sugars did not increase, he can stay off the medication.  - at today's visit, we reviewed his logs and they show occasional spikes (he gets dexamethasone with his chemotherapy treatments), but overall, his CBGs are at or close to normal when not change his regimen goal >>  - I suggested to:  Patient Instructions  Please continue: - Metformin 1000 mg 2x a day, with meals - Glipizide ER 5 mg daily in am, before b'fast  Please return in 4 months with your sugar log.  - today, HbA1c is 6.1% (wonderful!) - continue checking sugars at different times  of the day - check 1x a day, rotating checks - advised for yearly eye exams >> he has an eye exam coming up  - Return to clinic in 4 mo with sugar log    Philemon Kingdom, MD PhD [facility] Endocrinology

## 2016-10-04 ENCOUNTER — Telehealth: Payer: Self-pay | Admitting: Oncology

## 2016-10-04 ENCOUNTER — Ambulatory Visit: Payer: PPO

## 2016-10-04 ENCOUNTER — Ambulatory Visit (HOSPITAL_BASED_OUTPATIENT_CLINIC_OR_DEPARTMENT_OTHER): Payer: PPO | Admitting: Oncology

## 2016-10-04 ENCOUNTER — Other Ambulatory Visit (HOSPITAL_BASED_OUTPATIENT_CLINIC_OR_DEPARTMENT_OTHER): Payer: PPO

## 2016-10-04 ENCOUNTER — Ambulatory Visit (HOSPITAL_BASED_OUTPATIENT_CLINIC_OR_DEPARTMENT_OTHER): Payer: PPO

## 2016-10-04 VITALS — BP 127/47 | HR 81 | Temp 98.2°F | Resp 18 | Ht 70.0 in | Wt 203.0 lb

## 2016-10-04 DIAGNOSIS — C61 Malignant neoplasm of prostate: Secondary | ICD-10-CM

## 2016-10-04 DIAGNOSIS — R600 Localized edema: Secondary | ICD-10-CM

## 2016-10-04 DIAGNOSIS — C786 Secondary malignant neoplasm of retroperitoneum and peritoneum: Secondary | ICD-10-CM | POA: Diagnosis not present

## 2016-10-04 DIAGNOSIS — Z95828 Presence of other vascular implants and grafts: Secondary | ICD-10-CM

## 2016-10-04 LAB — COMPREHENSIVE METABOLIC PANEL
ALBUMIN: 3.1 g/dL — AB (ref 3.5–5.0)
ALK PHOS: 62 U/L (ref 40–150)
ALT: 12 U/L (ref 0–55)
AST: 20 U/L (ref 5–34)
Anion Gap: 11 mEq/L (ref 3–11)
BILIRUBIN TOTAL: 0.29 mg/dL (ref 0.20–1.20)
BUN: 15 mg/dL (ref 7.0–26.0)
CO2: 23 meq/L (ref 22–29)
Calcium: 9.1 mg/dL (ref 8.4–10.4)
Chloride: 108 mEq/L (ref 98–109)
Creatinine: 0.8 mg/dL (ref 0.7–1.3)
EGFR: >90 mL/min/{1.73_m2} (ref >90)
GLUCOSE: 175 mg/dL — AB (ref 70–140)
Potassium: 4.4 mEq/L (ref 3.5–5.1)
SODIUM: 143 meq/L (ref 136–145)
TOTAL PROTEIN: 5.6 g/dL — AB (ref 6.4–8.3)

## 2016-10-04 LAB — CBC WITH DIFFERENTIAL/PLATELET
BASO%: 1.2 % (ref 0.0–2.0)
Basophils Absolute: 0.1 10*3/uL (ref 0.0–0.1)
EOS%: 0.5 % (ref 0.0–7.0)
Eosinophils Absolute: 0 10*3/uL (ref 0.0–0.5)
HCT: 28 % — ABNORMAL LOW (ref 38.4–49.9)
HGB: 9 g/dL — ABNORMAL LOW (ref 13.0–17.1)
LYMPH%: 7.7 % — ABNORMAL LOW (ref 14.0–49.0)
MCH: 25.7 pg — ABNORMAL LOW (ref 27.2–33.4)
MCHC: 32.2 g/dL (ref 32.0–36.0)
MCV: 80 fL (ref 79.3–98.0)
MONO#: 0.8 10*3/uL (ref 0.1–0.9)
MONO%: 10.2 % (ref 0.0–14.0)
NEUT%: 80.4 % — ABNORMAL HIGH (ref 39.0–75.0)
NEUTROS ABS: 6.4 10*3/uL (ref 1.5–6.5)
Platelets: 470 10*3/uL — ABNORMAL HIGH (ref 140–400)
RBC: 3.5 10*6/uL — AB (ref 4.20–5.82)
RDW: 21.8 % — AB (ref 11.0–14.6)
WBC: 7.9 10*3/uL (ref 4.0–10.3)
lymph#: 0.6 10*3/uL — ABNORMAL LOW (ref 0.9–3.3)

## 2016-10-04 MED ORDER — SODIUM CHLORIDE 0.9% FLUSH
10.0000 mL | INTRAVENOUS | Status: DC | PRN
  Administered 2016-10-04: 10 mL via INTRAVENOUS
  Filled 2016-10-04: qty 10

## 2016-10-04 MED ORDER — HEPARIN SOD (PORK) LOCK FLUSH 100 UNIT/ML IV SOLN
500.0000 [IU] | Freq: Once | INTRAVENOUS | Status: AC
  Administered 2016-10-04: 500 [IU] via INTRAVENOUS
  Filled 2016-10-04: qty 5

## 2016-10-04 MED ORDER — FUROSEMIDE 20 MG PO TABS: 20.0000 mg | ORAL_TABLET | Freq: Every day | ORAL | 1 refills | Status: DC

## 2016-10-04 NOTE — Progress Notes (Signed)
Hematology and Oncology Follow Up Visit  Frank Larsen [id number] May 25, 1945 71 y.o. 10/04/2016 9:32 AM Frank Larsen, Frank Brow, MD   Principle Diagnosis: 71 year old gentleman with prostate cancer diagnosed in 2013 after presenting with a PSA of 3.6 and a Gleason score 5+4 = 9. He has peritoneal carcinomatosis biopsy proven to be adenocarcinoma likely of a prostate primary.   Prior Therapy: He is status post robotic-assisted laparoscopic radical prostatectomy and extended lymphadenectomy with the pathology reveals T2N0.  completed in June 2013.  Current therapy: Taxotere chemotherapy at 75 mg/m every 3 weeks started on 03/30/2016. He is status post 9 cycles of therapy.  Interim History: Frank Larsen presents today for a follow-up visit. Since the last visit, he reports worsening edema and generalized anasarca. His edema in his lower shin views of to the thigh and he started noticing some left upper extremity edema. Otherwise he reports feeling reasonably fair. He denied any nausea, vomiting or peripheral neuropathy. His appetite remained reasonable as his performance status is unchanged. He still able to drive and attends to activities of daily living. He did report some occasional dyspnea on exertion. He denied any dyspnea at rest.   He does not report any headaches, blurry vision, syncope or seizures. He does not report any fevers, chills, sweats. He does not report any chest pain, palpitation, orthopnea or leg edema. He does not report any cough, wheezing or hemoptysis. He does not report any hematochezia, melena or diarrhea at this time. He has not moved his bowels last few days. He does not report any frequency, urgency or hematuria. He does not report any skeletal complaints. Remaining review of systems and   Medications: I have reviewed the patient's current medications.  Current Outpatient Prescriptions  Medication Sig Dispense Refill  . atorvastatin (LIPITOR) 10 MG tablet  Take 10 mg by mouth daily with breakfast.     . Blood Glucose Monitoring Suppl (ONE TOUCH ULTRA 2) w/Device KIT Use to check blood sugar 2 times per day. 1 each 2  . glipiZIDE (GLUCOTROL XL) 5 MG 24 hr tablet Take 1 tablet (5 mg total) by mouth daily with breakfast. 90 tablet 2  . glucose blood (ONE TOUCH ULTRA TEST) test strip Use to check blood sugar 2 times per day. 200 each 5  . lidocaine-prilocaine (EMLA) cream Apply 1 application topically as needed. Apply to port before chemotherapy. 30 g 0  . metFORMIN (GLUCOPHAGE) 1000 MG tablet Take 1 tablet (1,000 mg total) by mouth 2 (two) times daily with a meal. 180 tablet 3  . Multiple Vitamin (MULTIVITAMIN) tablet Take 1 tablet by mouth daily.    [name] omeprazole (PRILOSEC) 20 MG capsule Take 20 mg by mouth every morning.    [name] DELICA LANCETS 09F MISC Use to check blood sugar 2 times per day. 200 each 2  . prochlorperazine (COMPAZINE) 10 MG tablet Take 1 tablet (10 mg total) by mouth every 6 (six) hours as needed for nausea or vomiting. 30 tablet 0  . pseudoephedrine-acetaminophen (TYLENOL SINUS) 30-500 MG TABS tablet Take 1 tablet by mouth every 4 (four) hours as needed (congestion).     . triamcinolone cream (KENALOG) 0.1 %     . furosemide (LASIX) 20 MG tablet Take 1 tablet (20 mg total) by mouth daily. 30 tablet 1   No current facility-administered medications for this visit.      Allergies:  Allergies  Allergen Reactions  . Scallops [Shellfish Allergy] Nausea And Vomiting  . Cephalexin Hives and  Rash    Past Medical History, Surgical history, Social history, and Family History were reviewed and updated.   Physical Exam: Blood pressure (!) 127/47, pulse 81, temperature 98.2 F (36.8 C), temperature source Oral, resp. rate 18, height '5\' 10"'  (1.778 m), weight 203 lb (92.1 kg), SpO2 98 %. ECOG: 1 General appearance: Alert, awake gentleman appeared without distress. Head: Normocephalic, without obvious abnormality no oral thrush  or ulcers. Neck: no adenopathy Lymph nodes: Cervical, supraclavicular, and axillary nodes normal. Heart:regular rate and rhythm, S1, S2 normal, no murmur, click, rub or gallop Lung:chest clear, no wheezing, rales, normal symmetric air entry Abdomin: soft, without masses or organomegaly. No shifting dullness or ascites. EXT: Bilateral lower extremity edema 2+. Slight edema noted in his left arm.   Lab Results: Lab Results  Component Value Date   WBC 7.9 10/04/2016   HGB 9.0 (L) 10/04/2016   HCT 28.0 (L) 10/04/2016   MCV 80.0 10/04/2016   PLT 470 (H) 10/04/2016     Chemistry      Component Value Date/Time   NA 143 10/04/2016 0829   K 4.4 10/04/2016 0829   CL 102 03/05/2016 1459   CO2 23 10/04/2016 0829   BUN 15.0 10/04/2016 0829   CREATININE 0.8 10/04/2016 0829   GLU 155 08/24/2015      Component Value Date/Time   CALCIUM 9.1 10/04/2016 0829   ALKPHOS 62 10/04/2016 0829   AST 20 10/04/2016 0829   ALT 12 10/04/2016 0829   BILITOT 0.29 10/04/2016 0829       Results for Frank Larsen (MRN [id number]) as of 10/04/2016 09:35  Ref. Range 08/23/2016 07:55 09/13/2016 07:53  PSA Latest Ref Range: 0.0 - 4.0 ng/mL <0.1 <0.1   EXAM: CT CHEST, ABDOMEN, AND PELVIS WITH CONTRAST  TECHNIQUE: Multidetector CT imaging of the chest, abdomen and pelvis was performed following the standard protocol during bolus administration of intravenous contrast.  CONTRAST:  165m ISOVUE-300 IOPAMIDOL (ISOVUE-300) INJECTION 61%  COMPARISON:  CT the chest, abdomen and pelvis 06/28/2016.  FINDINGS: CT CHEST FINDINGS  Cardiovascular: Heart size is normal. There is no significant pericardial fluid, thickening or pericardial calcification. There is aortic atherosclerosis, as well as atherosclerosis of the great vessels of the mediastinum and the coronary arteries, including calcified atherosclerotic plaque in the left anterior descending and right coronary arteries. Right internal jugular  single-lumen porta cath with tip terminating at the superior cavoatrial junction.  Mediastinum/Nodes: No pathologically enlarged mediastinal or hilar lymph nodes. Esophagus is unremarkable in appearance. No axillary lymphadenopathy. Enlarged juxta phrenic lymph nodes bilaterally measuring up 11 mm on the left and 10 mm on the right (axial image 48 of series 7).  Lungs/Pleura: New moderate bilateral pleural effusions (right greater than left) with associated passive subsegmental atelectasis in the lower lobes of the lungs bilaterally. No acute consolidative airspace disease. No suspicious appearing pulmonary nodules or masses.  Musculoskeletal: Soft tissue anchors in the right humeral head from prior rotator cuff surgery. Expansile sclerotic lesion in the lateral aspect of the right eighth rib appears similar to the prior examination (axial image 105 of series 4).  CT ABDOMEN PELVIS FINDINGS  Hepatobiliary: Subcentimeter low-attenuation lesion in segment 2 of the liver is too small to characterize, but similar to prior studies, likely a cyst. No other suspicious hepatic lesions are noted. No intra or extrahepatic biliary ductal dilatation. Gallbladder is normal in appearance.  Pancreas: No pancreatic mass. No pancreatic ductal dilatation. No pancreatic or peripancreatic fluid or inflammatory changes.  Spleen:  Unremarkable.  Adrenals/Urinary Tract: Bilateral kidneys and bilateral adrenal glands are normal in appearance. No hydroureteronephrosis. Urinary bladder is nearly decompressed. Wall of the urinary bladder appears mildly thickened, which could be related to under distention.  Stomach/Bowel: The appearance of the stomach is unremarkable. No pathologic dilatation of small bowel or colon. Appendix is not confidently identified and may be surgically absent.  Vascular/Lymphatic: Aortic atherosclerosis, without evidence of aneurysm or dissection in the abdominal or  pelvic vasculature. Multiple borderline enlarged retroperitoneal lymph nodes are noted, which are nonspecific. Borderline enlarged portacaval lymph node measuring 11 mm in short axis.  Reproductive: Status post prostatectomy.  Other: Small a moderate volume of ascites, increased compared to the prior study. Several areas of mild peritoneal enhancement and nodularity are noted, most evident in the left upper quadrant anterior to the spleen where there is a a 7 mm enhancing nodule (axial image 54 of series 7), and in the low anterior anatomic pelvis where the largest soft tissue implant is measuring 2.3 x 4.0 cm (axial image 113 of series 7), which has significantly increased compared to the prior study. Soft tissue stranding throughout the omentum.  Musculoskeletal: A few scattered sclerotic lesions are noted throughout the visualized axial and appendicular skeleton, suspicious for metastatic disease to the bones. The largest of these is in the right ilium measuring 9 mm (axial image 91 of series 7), similar to prior examinations.  IMPRESSION: 1. Progression of metastatic disease as evidenced by worsening intraperitoneal nodularity, increasing malignant ascites, increasing juxta phrenic lymphadenopathy, and new moderate bilateral pleural effusions. Metastatic disease to the bones appears similar to the prior study, as above. 2. Aortic atherosclerosis, in addition to two vessel coronary artery disease. Assessment for potential risk factor modification, dietary therapy or pharmacologic therapy may be warranted, if clinically indicated. 3. Additional incidental findings, as above.   Impression and Plan:   71 year old gentleman with the following issues:  1. 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 This was noted after presenting with symptoms of nausea, diarrhea and vomiting. He reports poor by mouth intake and weight loss. He does have history of  prostate cancer Gleason score 5+4 = 9 resected in 2013.  He is status post biopsy obtained on 03/14/2016 and the final pathology showed metastatic carcinoma that is poorly differentiated that favors prostate cancer. Although the specimen did not stain for PSA his tumor is poorly differentiated likely secretes very little to no PSA. His last PSA was 1.3.  He is currently receiving Taxotere salvage chemotherapy started on 03/30/2016.   He received 9 cycles of therapy With increased edema in the last few cycles.  CT scan on 09/27/2016 was reviewed with the patient today and showed progression of disease. The plan is to discontinue Taxotere chemotherapy and rebiopsy that tumor. This is salvage therapy will be needed and that could be in the form of different chemotherapy versus oral targeted therapy depending on the biopsy results. We will also send the tumor for next generation sequencing to see if there is a targetable mutation.  2. Abdominal pain: Resolved at this time. He is not taking any pain medication.  3. Antiemetics: Controlled at this time without complications.  4. IV access: Port-A-Cath placed and reports no recent complications.  5. Neutropenia prophylaxis: Neulasta will be discontinued.  6. Lower extremity edema: This is related to Taxotere chemotherapy. Prescription for Lasix was given to the patient and we will repeat his electrolytes in the next couple weeks.  7. Follow-up: Will  be in 3 weeks to evaluate his status and discuss the biopsy results.   [facility], MD 5/29/20189:32 AM

## 2016-10-04 NOTE — Telephone Encounter (Signed)
Appointments scheduled per 10/04/16 los. Patient was given a copy of the AVS report and appointment schedule per 10/04/16 los.

## 2016-10-05 LAB — PSA

## 2016-10-06 ENCOUNTER — Ambulatory Visit: Payer: PPO

## 2016-10-11 ENCOUNTER — Other Ambulatory Visit: Payer: Self-pay | Admitting: Student

## 2016-10-11 ENCOUNTER — Other Ambulatory Visit: Payer: Self-pay | Admitting: Radiology

## 2016-10-11 ENCOUNTER — Telehealth: Payer: Self-pay | Admitting: *Deleted

## 2016-10-11 DIAGNOSIS — L299 Pruritus, unspecified: Secondary | ICD-10-CM | POA: Diagnosis not present

## 2016-10-11 DIAGNOSIS — E119 Type 2 diabetes mellitus without complications: Secondary | ICD-10-CM | POA: Diagnosis not present

## 2016-10-11 DIAGNOSIS — C481 Malignant neoplasm of specified parts of peritoneum: Secondary | ICD-10-CM | POA: Diagnosis not present

## 2016-10-11 DIAGNOSIS — H524 Presbyopia: Secondary | ICD-10-CM | POA: Diagnosis not present

## 2016-10-11 NOTE — Telephone Encounter (Signed)
Patient left voice mail message stating,"I have extreme itching that is causing me sleepless nights. My dermatologist is recommending "light therapy" which is like a tanning bed. Is this OK with Dr. Alen Blew? Return number is [phone].

## 2016-10-12 ENCOUNTER — Ambulatory Visit (HOSPITAL_COMMUNITY)
Admission: RE | Admit: 2016-10-12 | Discharge: 2016-10-12 | Disposition: A | Discharge status: Home / Self Care | Payer: PPO | Source: Ambulatory Visit | Attending: Oncology | Admitting: Oncology

## 2016-10-12 ENCOUNTER — Other Ambulatory Visit: Payer: Self-pay | Admitting: Student

## 2016-10-12 ENCOUNTER — Encounter (HOSPITAL_COMMUNITY): Payer: Self-pay

## 2016-10-12 DIAGNOSIS — Z7984 Long term (current) use of oral hypoglycemic drugs: Secondary | ICD-10-CM | POA: Diagnosis not present

## 2016-10-12 DIAGNOSIS — C61 Malignant neoplasm of prostate: Secondary | ICD-10-CM | POA: Diagnosis not present

## 2016-10-12 DIAGNOSIS — Z79899 Other long term (current) drug therapy: Secondary | ICD-10-CM | POA: Diagnosis not present

## 2016-10-12 DIAGNOSIS — Z9889 Other specified postprocedural states: Secondary | ICD-10-CM | POA: Insufficient documentation

## 2016-10-12 DIAGNOSIS — C482 Malignant neoplasm of peritoneum, unspecified: Secondary | ICD-10-CM | POA: Diagnosis not present

## 2016-10-12 DIAGNOSIS — Z923 Personal history of irradiation: Secondary | ICD-10-CM | POA: Diagnosis not present

## 2016-10-12 DIAGNOSIS — E7800 Pure hypercholesterolemia, unspecified: Secondary | ICD-10-CM | POA: Insufficient documentation

## 2016-10-12 DIAGNOSIS — Z85820 Personal history of malignant melanoma of skin: Secondary | ICD-10-CM | POA: Diagnosis not present

## 2016-10-12 DIAGNOSIS — G8929 Other chronic pain: Secondary | ICD-10-CM | POA: Diagnosis not present

## 2016-10-12 DIAGNOSIS — Z9049 Acquired absence of other specified parts of digestive tract: Secondary | ICD-10-CM | POA: Diagnosis not present

## 2016-10-12 DIAGNOSIS — Z91013 Allergy to seafood: Secondary | ICD-10-CM | POA: Insufficient documentation

## 2016-10-12 DIAGNOSIS — K219 Gastro-esophageal reflux disease without esophagitis: Secondary | ICD-10-CM | POA: Diagnosis not present

## 2016-10-12 DIAGNOSIS — Z9079 Acquired absence of other genital organ(s): Secondary | ICD-10-CM | POA: Diagnosis not present

## 2016-10-12 DIAGNOSIS — M545 Low back pain: Secondary | ICD-10-CM | POA: Diagnosis not present

## 2016-10-12 DIAGNOSIS — E785 Hyperlipidemia, unspecified: Secondary | ICD-10-CM | POA: Insufficient documentation

## 2016-10-12 DIAGNOSIS — C786 Secondary malignant neoplasm of retroperitoneum and peritoneum: Secondary | ICD-10-CM | POA: Diagnosis not present

## 2016-10-12 DIAGNOSIS — E119 Type 2 diabetes mellitus without complications: Secondary | ICD-10-CM | POA: Insufficient documentation

## 2016-10-12 DIAGNOSIS — R1909 Other intra-abdominal and pelvic swelling, mass and lump: Secondary | ICD-10-CM | POA: Diagnosis not present

## 2016-10-12 LAB — CBC
HEMATOCRIT: 27.8 % — AB (ref 39.0–52.0)
Hemoglobin: 8.4 g/dL — ABNORMAL LOW (ref 13.0–17.0)
MCH: 24.9 pg — ABNORMAL LOW (ref 26.0–34.0)
MCHC: 30.2 g/dL (ref 30.0–36.0)
MCV: 82.2 fL (ref 78.0–100.0)
Platelets: 311 10*3/uL (ref 150–400)
RBC: 3.38 MIL/uL — AB (ref 4.22–5.81)
RDW: 21.5 % — ABNORMAL HIGH (ref 11.5–15.5)
WBC: 6.2 10*3/uL (ref 4.0–10.5)

## 2016-10-12 LAB — APTT: aPTT: 30 seconds (ref 24–36)

## 2016-10-12 LAB — GLUCOSE, CAPILLARY: Glucose-Capillary: 127 mg/dL — ABNORMAL HIGH (ref 65–99)

## 2016-10-12 LAB — PROTIME-INR
INR: 1.09
Prothrombin Time: 14.2 seconds (ref 11.4–15.2)

## 2016-10-12 MED ORDER — MIDAZOLAM HCL 2 MG/2ML IJ SOLN
INTRAMUSCULAR | Status: AC | PRN
  Administered 2016-10-12: 1 mg via INTRAVENOUS
  Administered 2016-10-12: 2 mg via INTRAVENOUS
  Administered 2016-10-12: 1 mg via INTRAVENOUS

## 2016-10-12 MED ORDER — SODIUM CHLORIDE 0.9 % IV SOLN
INTRAVENOUS | Status: DC
  Administered 2016-10-12: 08:00:00 via INTRAVENOUS

## 2016-10-12 MED ORDER — FENTANYL CITRATE (PF) 100 MCG/2ML IJ SOLN
INTRAMUSCULAR | Status: AC | PRN
Start: 2016-10-12 — End: 2016-10-12
  Administered 2016-10-12 (×2): 25 ug via INTRAVENOUS
  Administered 2016-10-12: 50 ug via INTRAVENOUS

## 2016-10-12 MED ORDER — MIDAZOLAM HCL 2 MG/2ML IJ SOLN
INTRAMUSCULAR | Status: AC
  Filled 2016-10-12: qty 6

## 2016-10-12 MED ORDER — FENTANYL CITRATE (PF) 100 MCG/2ML IJ SOLN
INTRAMUSCULAR | Status: AC
  Filled 2016-10-12: qty 4

## 2016-10-12 MED ORDER — HEPARIN SOD (PORK) LOCK FLUSH 100 UNIT/ML IV SOLN
500.0000 [IU] | INTRAVENOUS | Status: AC | PRN
  Administered 2016-10-12: 500 [IU]
  Filled 2016-10-12: qty 5

## 2016-10-12 NOTE — H&P (Signed)
Referring Physician(s): Wyatt Portela  Supervising Physician: Jacqulynn Cadet  Patient Status:  Frank Larsen OP  Chief Complaint:  "I'm here for a biopsy"  Subjective: Patient familiar to IR service from prior omental biopsy in 2017 as well as Port-A-Cath placement. He has a known history of prostate cancer initially diagnosed in 2013, status post prostatectomy and currently on chemotherapy.  Recent imaging has revealed progression of disease and he presents again today for CT-guided peritoneal implant/mass biopsy for further evaluation. He currently denies fever, headache, chest pain, dyspnea, cough, back pain, nausea, vomiting or abnormal bleeding. He does have some intermittent mild abdominal discomfort and lower extremity swelling. Past Medical History:  Diagnosis Date  . AK (actinic keratosis)   . Arthritis   . BPH (benign prostatic hyperplasia)   . Chronic low back pain    since 1979  . Diabetes mellitus ORAL MED   type 2  . Elevated PSA   . GERD (gastroesophageal reflux disease)   . Hearing loss    using bilateral hearing aids  . History of radiation therapy 01/11/14- 03/04/14   prostate bed 6600 cGy 33 sessions  . Hypercholesterolemia   . Hypercholesterolemia   . Hyperlipidemia   . Impaired hearing BILATERAL HEARING AIDS   20% RIGHT HEARING DUE TO VIRUS  . Melanoma ([place])   . Normal cardiac stress test 2010  . Prostate cancer ([place]) 07/20/11   Gleason 9  . Prostate cancer ([place])   . Prostate nodule   . Skin cancer    malignant melanoma of skin   . Tinnitus    stable since 1992  . Tobacco use    Past Surgical History:  Procedure Laterality Date  . APPENDECTOMY  AGE 63  . CARPOMETACARPAL JOINT ARTHROTOMY  03-22-2006   RIGHT THUMB  . CARPOMETACARPAL JOINT ARTHROTOMY  12-21-2005   LEFT THUMB  . IR GENERIC HISTORICAL  03/22/2016   IR US GUIDE VASC ACCESS RIGHT [facility]  . IR GENERIC HISTORICAL  03/22/2016   IR FLUORO GUIDE PORT INSERTION RIGHT [facility]  .  MELANOMA EXCISION  07-12-10   RIGHT NECK  . PROSTATE BIOPSY  07/20/2011   Procedure: BIOPSY TRANSRECTAL ULTRASONIC PROSTATE (TUBP);  Surgeon: Molli Hazard, MD;  Location: [facility];  Service: Urology;  Laterality: N/A;  1 hour requested for this case  Saturation BX  OFFICE TO BRING ULTRASOUND MACHINE    . ROBOT ASSISTED LAPAROSCOPIC RADICAL PROSTATECTOMY  10/19/2011   Procedure: ROBOTIC ASSISTED LAPAROSCOPIC RADICAL PROSTATECTOMY;  Surgeon: Molli Hazard, MD;  Location: [facility];  Service: Urology;  Laterality: N/A;      . SHOULDER OPEN ROTATOR CUFF REPAIR  07-29-2010- RIGHT   AND SUBACROMIAL DECOMPRESSION AROMIOPLASTY  . TONSILLECTOMY  AGE 88    Allergies: Scallops [shellfish allergy] and Cephalexin  Medications: Prior to Admission medications   Medication Sig Start Date End Date Taking? Authorizing Provider  atorvastatin (LIPITOR) 10 MG tablet Take 10 mg by mouth daily with breakfast.    Yes [provider]  furosemide (LASIX) 20 MG tablet Take 1 tablet (20 mg total) by mouth daily. 10/04/16  Yes Wyatt Portela, MD  glipiZIDE (GLUCOTROL XL) 5 MG 24 hr tablet Take 1 tablet (5 mg total) by mouth daily with breakfast. 04/20/16  Yes Philemon Kingdom, MD  lidocaine-prilocaine (EMLA) cream Apply 1 application topically as needed. Apply to port before chemotherapy. 03/17/16  Yes Wyatt Portela, MD  metFORMIN (GLUCOPHAGE) 1000 MG tablet Take 1 tablet (1,000 mg  total) by mouth 2 (two) times daily with a meal. 06/30/16  Yes Philemon Kingdom, MD  Multiple Vitamin (MULTIVITAMIN) tablet Take 1 tablet by mouth daily.   Yes [provider]  omeprazole (PRILOSEC) 20 MG capsule Take 20 mg by mouth every morning.   Yes [provider]  pseudoephedrine-acetaminophen (TYLENOL SINUS) 30-500 MG TABS tablet Take 1 tablet by mouth every 4 (four) hours as needed (congestion).    Yes [provider]  Blood Glucose Monitoring Suppl (ONE TOUCH  ULTRA 2) w/Device KIT Use to check blood sugar 2 times per day. 10/30/15   Elayne Snare, MD  glucose blood (ONE TOUCH ULTRA TEST) test strip Use to check blood sugar 2 times per day. 01/25/16   Philemon Kingdom, MD  [facility] DELICA LANCETS 76L MISC Use to check blood sugar 2 times per day. 01/27/16   Philemon Kingdom, MD  prochlorperazine (COMPAZINE) 10 MG tablet Take 1 tablet (10 mg total) by mouth every 6 (six) hours as needed for nausea or vomiting. 03/17/16   Wyatt Portela, MD  triamcinolone cream (KENALOG) 0.1 %  09/02/16   [provider]     Vital Signs: BP (!) 128/55   Pulse 80   Temp 97.9 F (36.6 C) (Oral)   Resp 18   SpO2 95%   Physical Exam awake, alert. Clean, intact right chest wall Port-A-Cath. Chest with diminished breath sounds at bases, greater on right. Heart with regular rate, occasional ectopy. Abdomen soft, positive bowel sounds, nontender; 2-3+ bilateral lower extremity edema.  Imaging: No results found.  Labs:  CBC:  Recent Labs  08/23/16 0754 09/13/16 0752 10/04/16 0829 10/12/16 0741  WBC 6.8 9.0 7.9 6.2  HGB 9.0* 8.9* 9.0* 8.4*  HCT 28.4* 28.1* 28.0* 27.8*  PLT 499* 490* 470* 311    COAGS:  Recent Labs  03/14/16 0735 10/12/16 0741  INR 1.11 1.09  APTT  --  30    BMP:  Recent Labs  03/05/16 1459  08/02/16 0743 08/23/16 0754 09/13/16 0753 10/04/16 0829  NA 136  < > 141 142 141 143  K 4.1  < > 4.3 4.3 4.4 4.4  CL 102  --   --   --   --   --   CO2 22  < > 21* '23 23 23  ' GLUCOSE 205*  < > 169* 206* 163* 175*  BUN 28*  < > 11.7 16.8 18.7 15.0  CALCIUM 9.2  < > 8.9 9.1 9.0 9.1  CREATININE 0.95  < > 0.8 0.8 0.8 0.8  GFRNONAA >60  --   --   --   --   --   GFRAA >60  --   --   --   --   --   < > = values in this interval not displayed.  LIVER FUNCTION TESTS:  Recent Labs  08/02/16 0743 08/23/16 0754 09/13/16 0753 10/04/16 0829  BILITOT 0.24 0.32 0.30 0.29  AST '15 19 16 20  ' ALT '10 14 13 12  ' ALKPHOS 44 42 48 62  PROT  5.7* 5.6* 5.6* 5.6*  ALBUMIN 3.1* 3.1* 3.1* 3.1*    Assessment and Plan: Pt with known history of prostate cancer initially diagnosed in 2013, status post prostatectomy and currently on chemotherapy.  Recent imaging has revealed progression of disease and he presents  today for CT-guided peritoneal implant/mass biopsy for further evaluation.Risks and benefits discussed with the patient/spouse including, but not limited to bleeding, infection, damage to adjacent structures or low  yield requiring additional tests.All of the patient's questions were answered, patient is agreeable to proceed.Consent signed and in chart.     Electronically Signed: D. Rowe Robert, PA-C 10/12/2016, 8:34 AM   I spent a total of 20 minutes at the the patient's bedside AND on the patient's hospital floor or unit, greater than 50% of which was counseling/coordinating care for CT-guided peritoneal mass biopsy

## 2016-10-12 NOTE — Telephone Encounter (Signed)
Spoke with Frank Larsen, let him know that dr Alen Blew has no objections to prescribed  light therapy for his itching.

## 2016-10-12 NOTE — Telephone Encounter (Signed)
I have no objections. 

## 2016-10-12 NOTE — Procedures (Signed)
Interventional Radiology Procedure Note  Procedure: CT guided biopsy of peritoneal implant.   Complications: None  Estimated Blood Loss: None  Recommendations: - Bedrest x 1 hr - DC home - Path pending  Signed,  Criselda Peaches, MD

## 2016-10-12 NOTE — Discharge Instructions (Signed)
May remove bandage in 24 hours and shower.      Needle Biopsy, Care After These instructions give you information about caring for yourself after your procedure. Your doctor may also give you more specific instructions. Call your doctor if you have any problems or questions after your procedure. Follow these instructions at home:  Rest as told by your doctor.  Take medicines only as told by your doctor.  There are many different ways to close and cover the biopsy site, including stitches (sutures), skin glue, and adhesive strips. Follow instructions from your doctor about: ? How to take care of your biopsy site. ? When and how you should change your bandage (dressing). ? When you should remove your dressing. ? Removing whatever was used to close your biopsy site.  Check your biopsy site every day for signs of infection. Watch for: ? Redness, swelling, or pain. ? Fluid, blood, or pus. Contact a doctor if:  You have a fever.  You have redness, swelling, or pain at the biopsy site, and it lasts longer than a few days.  You have fluid, blood, or pus coming from the biopsy site.  You feel sick to your stomach (nauseous).  You throw up (vomit). Get help right away if:  You are short of breath.  You have trouble breathing.  Your chest hurts.  You feel dizzy or you pass out (faint).  You have bleeding that does not stop with pressure or a bandage.  You cough up blood.  Your belly (abdomen) hurts. This information is not intended to replace advice given to you by your health care provider. Make sure you discuss any questions you have with your health care provider. Document Released: 04/07/2008 Document Revised: 10/01/2015 Document Reviewed: 04/21/2014 Elsevier Interactive Patient Education  2018 [place].    Moderate Conscious Sedation, Adult, Care After These instructions provide you with information about caring for yourself after your procedure. Your health care  provider may also give you more specific instructions. Your treatment has been planned according to current medical practices, but problems sometimes occur. Call your health care provider if you have any problems or questions after your procedure. What can I expect after the procedure? After your procedure, it is common:  To feel sleepy for several hours.  To feel clumsy and have poor balance for several hours.  To have poor judgment for several hours.  To vomit if you eat too soon.  Follow these instructions at home: For at least 24 hours after the procedure:   Do not: ? Participate in activities where you could fall or become injured. ? Drive. ? Use heavy machinery. ? Drink alcohol. ? Take sleeping pills or medicines that cause drowsiness. ? Make important decisions or sign legal documents. ? Take care of children on your own.  Rest. Eating and drinking  Follow the diet recommended by your health care provider.  If you vomit: ? Drink water, juice, or soup when you can drink without vomiting. ? Make sure you have little or no nausea before eating solid foods. General instructions  Have a responsible adult stay with you until you are awake and alert.  Take over-the-counter and prescription medicines only as told by your health care provider.  If you smoke, do not smoke without supervision.  Keep all follow-up visits as told by your health care provider. This is important. Contact a health care provider if:  You keep feeling nauseous or you keep vomiting.  You feel light-headed.  You develop a rash.  You have a fever. Get help right away if:  You have trouble breathing. This information is not intended to replace advice given to you by your health care provider. Make sure you discuss any questions you have with your health care provider. Document Released: 02/13/2013 Document Revised: 09/28/2015 Document Reviewed: 08/15/2015 Elsevier Interactive Patient  Education  [employer].

## 2016-10-14 ENCOUNTER — Encounter: Payer: Self-pay | Admitting: Oncology

## 2016-10-14 ENCOUNTER — Other Ambulatory Visit: Payer: Self-pay | Admitting: Oncology

## 2016-10-14 DIAGNOSIS — C61 Malignant neoplasm of prostate: Secondary | ICD-10-CM

## 2016-10-14 NOTE — Progress Notes (Signed)
The results of his biopsy was discussed with Dr. 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 These results were discussed with Frank Larsen over the phone today. Based on these findings, he will require different salvage chemotherapy to accommodate his transformation of this cancer.  Risks and benefits of carboplatin and etoposide chemotherapy regimen was discussed today with the patient and his wife. Complications include nausea, vomiting, myelosuppression, fatigue among others were reviewed. He will require growth factor support in the form of Neulasta given the high-risk of neutropenia.  After discussion today he is agreeable to proceed with this treatment. He understands the has a incurable malignancy and the treatment goal is palliative. He has prescription for antiemetics available to him at this time.

## 2016-10-14 NOTE — Progress Notes (Signed)
DISCONTINUE ON PATHWAY REGIMEN - Prostate     A cycle is every 21 days:     Docetaxel        Dose Mod: None     Prednisone        Dose Mod: None  **Always confirm dose/schedule in your pharmacy ordering system**    REASON: Disease Progression PRIOR TREATMENT: POS37: Docetaxel 75 mg/m2 q21 Days + Prednisone 5 mg BID Until Progression or Toxicity TREATMENT RESPONSE: Progressive Disease (PD)  START ON PATHWAY REGIMEN - Prostate     A cycle is every 21 days:     Etoposide      Carboplatin   **Always confirm dose/schedule in your pharmacy ordering system**    Patient Characteristics: Small Cell, Small Cell Carcinoma Current radiographic evidence of distant metastasis? Yes Histology: Small Cell Carcinoma AJCC T Category: Staged < 8th Ed. Gleason Primary: Staged < 8th Ed. AJCC N Category: Staged < 8th Ed. Gleason Secondary: Staged < 8th Ed. AJCC M Category: Staged < 8th Ed. Gleason Score: Staged < 8th Ed. AJCC 8 Stage Grouping: Staged < 8th Ed. PSA Values (ng/mL): Staged < 8th Ed. Would you be surprised if this patient died  in the next year? I would be surprised if this patient died in the next year Intent of Therapy: Non-Curative / Palliative Intent, Discussed with Patient

## 2016-10-17 ENCOUNTER — Telehealth: Payer: Self-pay | Admitting: *Deleted

## 2016-10-17 NOTE — Telephone Encounter (Signed)
This RN read patient's email but decided to answer the email by a phone call. Instructed patient that he will have infusions on June 18, 19, and 20. Injection to be on June 21. Patient verbalized understanding.

## 2016-10-24 ENCOUNTER — Ambulatory Visit (HOSPITAL_BASED_OUTPATIENT_CLINIC_OR_DEPARTMENT_OTHER): Payer: PPO | Admitting: Oncology

## 2016-10-24 ENCOUNTER — Telehealth: Payer: Self-pay | Admitting: Oncology

## 2016-10-24 ENCOUNTER — Other Ambulatory Visit: Payer: PPO

## 2016-10-24 ENCOUNTER — Other Ambulatory Visit (HOSPITAL_BASED_OUTPATIENT_CLINIC_OR_DEPARTMENT_OTHER): Payer: PPO

## 2016-10-24 ENCOUNTER — Ambulatory Visit: Payer: PPO

## 2016-10-24 ENCOUNTER — Ambulatory Visit (HOSPITAL_BASED_OUTPATIENT_CLINIC_OR_DEPARTMENT_OTHER): Payer: PPO

## 2016-10-24 VITALS — BP 128/56 | HR 80 | Temp 98.4°F | Resp 18 | Ht 70.0 in | Wt 193.9 lb

## 2016-10-24 DIAGNOSIS — C61 Malignant neoplasm of prostate: Secondary | ICD-10-CM | POA: Diagnosis not present

## 2016-10-24 DIAGNOSIS — C786 Secondary malignant neoplasm of retroperitoneum and peritoneum: Secondary | ICD-10-CM | POA: Diagnosis not present

## 2016-10-24 DIAGNOSIS — L299 Pruritus, unspecified: Secondary | ICD-10-CM

## 2016-10-24 DIAGNOSIS — Z5111 Encounter for antineoplastic chemotherapy: Secondary | ICD-10-CM | POA: Diagnosis not present

## 2016-10-24 DIAGNOSIS — R600 Localized edema: Secondary | ICD-10-CM | POA: Diagnosis not present

## 2016-10-24 DIAGNOSIS — Z95828 Presence of other vascular implants and grafts: Secondary | ICD-10-CM

## 2016-10-24 DIAGNOSIS — D6481 Anemia due to antineoplastic chemotherapy: Secondary | ICD-10-CM | POA: Diagnosis not present

## 2016-10-24 DIAGNOSIS — D630 Anemia in neoplastic disease: Secondary | ICD-10-CM | POA: Diagnosis not present

## 2016-10-24 LAB — CBC WITH DIFFERENTIAL/PLATELET
BASO%: 0.7 % (ref 0.0–2.0)
Basophils Absolute: 0 10*3/uL (ref 0.0–0.1)
EOS ABS: 0.9 10*3/uL — AB (ref 0.0–0.5)
EOS%: 15.8 % — ABNORMAL HIGH (ref 0.0–7.0)
HEMATOCRIT: 27.5 % — AB (ref 38.4–49.9)
HGB: 8.6 g/dL — ABNORMAL LOW (ref 13.0–17.1)
LYMPH%: 9.7 % — AB (ref 14.0–49.0)
MCH: 25 pg — AB (ref 27.2–33.4)
MCHC: 31.4 g/dL — AB (ref 32.0–36.0)
MCV: 79.7 fL (ref 79.3–98.0)
MONO#: 0.6 10*3/uL (ref 0.1–0.9)
MONO%: 9.8 % (ref 0.0–14.0)
NEUT#: 3.7 10*3/uL (ref 1.5–6.5)
NEUT%: 64 % (ref 39.0–75.0)
PLATELETS: 319 10*3/uL (ref 140–400)
RBC: 3.45 10*6/uL — AB (ref 4.20–5.82)
RDW: 21.8 % — ABNORMAL HIGH (ref 11.0–14.6)
WBC: 5.8 10*3/uL (ref 4.0–10.3)
lymph#: 0.6 10*3/uL — ABNORMAL LOW (ref 0.9–3.3)
nRBC: 0 % (ref 0–0)

## 2016-10-24 LAB — COMPREHENSIVE METABOLIC PANEL
ALT: 9 U/L (ref 0–55)
ANION GAP: 9 meq/L (ref 3–11)
AST: 16 U/L (ref 5–34)
Albumin: 3.2 g/dL — ABNORMAL LOW (ref 3.5–5.0)
Alkaline Phosphatase: 45 U/L (ref 40–150)
BUN: 22.4 mg/dL (ref 7.0–26.0)
CALCIUM: 9.2 mg/dL (ref 8.4–10.4)
CHLORIDE: 108 meq/L (ref 98–109)
CO2: 24 meq/L (ref 22–29)
Creatinine: 0.8 mg/dL (ref 0.7–1.3)
Glucose: 115 mg/dl (ref 70–140)
POTASSIUM: 4.3 meq/L (ref 3.5–5.1)
Sodium: 141 mEq/L (ref 136–145)
Total Bilirubin: 0.27 mg/dL (ref 0.20–1.20)
Total Protein: 5.9 g/dL — ABNORMAL LOW (ref 6.4–8.3)

## 2016-10-24 MED ORDER — HYDROXYZINE HCL 10 MG PO TABS: 10.0000 mg | ORAL_TABLET | Freq: Four times a day (QID) | ORAL | 0 refills | Status: DC | PRN

## 2016-10-24 MED ORDER — PALONOSETRON HCL INJECTION 0.25 MG/5ML
0.2500 mg | Freq: Once | INTRAVENOUS | Status: AC
  Administered 2016-10-24: 0.25 mg via INTRAVENOUS

## 2016-10-24 MED ORDER — SODIUM CHLORIDE 0.9 % IV SOLN
10.0000 mg | Freq: Once | INTRAVENOUS | Status: AC
  Administered 2016-10-24: 10 mg via INTRAVENOUS
  Filled 2016-10-24: qty 1

## 2016-10-24 MED ORDER — PALONOSETRON HCL INJECTION 0.25 MG/5ML
INTRAVENOUS | Status: AC
  Filled 2016-10-24: qty 5

## 2016-10-24 MED ORDER — SODIUM CHLORIDE 0.9% FLUSH
10.0000 mL | INTRAVENOUS | Status: DC | PRN
  Administered 2016-10-24: 10 mL
  Filled 2016-10-24: qty 10

## 2016-10-24 MED ORDER — HEPARIN SOD (PORK) LOCK FLUSH 100 UNIT/ML IV SOLN
500.0000 [IU] | Freq: Once | INTRAVENOUS | Status: AC | PRN
  Administered 2016-10-24: 500 [IU]
  Filled 2016-10-24: qty 5

## 2016-10-24 MED ORDER — SODIUM CHLORIDE 0.9 % IV SOLN
95.0000 mg/m2 | Freq: Once | INTRAVENOUS | Status: AC
  Administered 2016-10-24: 200 mg via INTRAVENOUS
  Filled 2016-10-24: qty 10

## 2016-10-24 MED ORDER — SODIUM CHLORIDE 0.9 % IV SOLN
566.5000 mg | Freq: Once | INTRAVENOUS | Status: AC
  Administered 2016-10-24: 570 mg via INTRAVENOUS
  Filled 2016-10-24: qty 57

## 2016-10-24 MED ORDER — SODIUM CHLORIDE 0.9% FLUSH
10.0000 mL | INTRAVENOUS | Status: DC | PRN
  Administered 2016-10-24: 10 mL via INTRAVENOUS
  Filled 2016-10-24: qty 10

## 2016-10-24 MED ORDER — SODIUM CHLORIDE 0.9 % IV SOLN
Freq: Once | INTRAVENOUS | Status: AC
  Administered 2016-10-24: 11:00:00 via INTRAVENOUS

## 2016-10-24 MED ORDER — DEXAMETHASONE SODIUM PHOSPHATE 10 MG/ML IJ SOLN
INTRAMUSCULAR | Status: AC
  Filled 2016-10-24: qty 1

## 2016-10-24 NOTE — Telephone Encounter (Signed)
Scheduled appt per 6/18 los - Gave patient AVS and calender per 6/18 LOS. Unable to schedule f/u 7/11 - [facility] template not open yet.

## 2016-10-24 NOTE — Patient Instructions (Signed)
[facility] Discharge Instructions for Patients Receiving Chemotherapy  Today you received the following chemotherapy agents: Carboplatin and Etoposide.  To help prevent nausea and vomiting after your treatment, we encourage you to take your nausea medication as directed by your doctor.   If you develop nausea and vomiting that is not controlled by your nausea medication, call the clinic.   BELOW ARE SYMPTOMS THAT SHOULD BE REPORTED IMMEDIATELY:  *FEVER GREATER THAN 100.5 F  *CHILLS WITH OR WITHOUT FEVER  NAUSEA AND VOMITING THAT IS NOT CONTROLLED WITH YOUR NAUSEA MEDICATION  *UNUSUAL SHORTNESS OF BREATH  *UNUSUAL BRUISING OR BLEEDING  TENDERNESS IN MOUTH AND THROAT WITH OR WITHOUT PRESENCE OF ULCERS  *URINARY PROBLEMS  *BOWEL PROBLEMS  UNUSUAL RASH Items with * indicate a potential emergency and should be followed up as soon as possible.  Feel free to call the clinic you have any questions or concerns. The clinic phone number is [phone].  Please show the [facility] at check-in to the Emergency Department and triage nurse.

## 2016-10-24 NOTE — Patient Instructions (Signed)

## 2016-10-24 NOTE — Progress Notes (Signed)
Hematology and Oncology Follow Up Larsen  Frank Larsen [id number] 02-16-1946 71 y.o. 10/24/2016 10:57 AM Frank Larsen, Frank Brow, MD   Principle Diagnosis: 71 year old gentleman with prostate cancer diagnosed in 2013 after presenting with a PSA of 3.6 and a Gleason score 5+4 = 9. He has peritoneal carcinomatosis biopsy proven to be adenocarcinoma likely of a prostate primary.   Prior Therapy: He is status post robotic-assisted laparoscopic radical prostatectomy and extended lymphadenectomy with the pathology reveals T2N0.  completed in June 2013. Taxotere chemotherapy at 75 mg/m every 3 weeks started on 03/30/2016. He is status post 9 cycles of therapy.  He developed relapsed disease and a biopsy proven to show neuroendocrine poorly differentiated tumor in June 2018.  Current therapy: Under evaluation to start salvage chemotherapy utilizing carboplatin and etoposide. Cycle 1 to be given on 10/24/2016. He'll receive day one with carboplatin and etoposide. Day 2 and day 3 of etoposide only.  Interim History: Frank Larsen presents today for a follow-up Larsen. Since the last Larsen, he reports few complaints. He is reporting worsening peritonitis that is not responding to Benadryl and topical creams. He reports his edema is improving with the help of Lasix. His upper extremities edema has resolved and his lower extremity edema is improving slowly. His weight is down but his food intake is not dramatically different. He does report to more fatigue and tiredness and occasional dyspnea on exertion and at times he has to stop for rest. He does not report any abdominal pain or change in his bowel habits. His performance status has declined slightly.   He does not report any headaches, blurry vision, syncope or seizures. He does not report any fevers, chills, sweats. He does not report any chest pain, palpitation, orthopnea or leg edema. He does not report any cough, wheezing or hemoptysis. He does  not report any hematochezia, melena or diarrhea at this time. He has not moved his bowels last few days. He does not report any frequency, urgency or hematuria. He does not report any skeletal complaints. Remaining review of systems and   Medications: I have reviewed the patient's current medications.  Current Outpatient Prescriptions  Medication Sig Dispense Refill  . atorvastatin (LIPITOR) 10 MG tablet Take 10 mg by mouth daily with breakfast.     . Blood Glucose Monitoring Suppl (ONE TOUCH ULTRA 2) w/Device KIT Use to check blood sugar 2 times per day. 1 each 2  . furosemide (LASIX) 20 MG tablet Take 1 tablet (20 mg total) by mouth daily. 30 tablet 1  . glipiZIDE (GLUCOTROL XL) 5 MG 24 hr tablet Take 1 tablet (5 mg total) by mouth daily with breakfast. 90 tablet 2  . glucose blood (ONE TOUCH ULTRA TEST) test strip Use to check blood sugar 2 times per day. 200 each 5  . hydrOXYzine (ATARAX/VISTARIL) 10 MG tablet Take 1 tablet (10 mg total) by mouth every 6 (six) hours as needed. 60 tablet 0  . lidocaine-prilocaine (EMLA) cream Apply 1 application topically as needed. Apply to port before chemotherapy. 30 g 0  . metFORMIN (GLUCOPHAGE) 1000 MG tablet Take 1 tablet (1,000 mg total) by mouth 2 (two) times daily with a meal. 180 tablet 3  . Multiple Vitamin (MULTIVITAMIN) tablet Take 1 tablet by mouth daily.    [name] omeprazole (PRILOSEC) 20 MG capsule Take 20 mg by mouth every morning.    [name] DELICA LANCETS 81X MISC Use to check blood sugar 2 times per day. 200 each 2  .  prochlorperazine (COMPAZINE) 10 MG tablet Take 1 tablet (10 mg total) by mouth every 6 (six) hours as needed for nausea or vomiting. 30 tablet 0  . pseudoephedrine-acetaminophen (TYLENOL SINUS) 30-500 MG TABS tablet Take 1 tablet by mouth every 4 (four) hours as needed (congestion).     . triamcinolone cream (KENALOG) 0.1 %      No current facility-administered medications for this Larsen.      Allergies:  Allergies   Allergen Reactions  . Scallops [Shellfish Allergy] Nausea And Vomiting  . Cephalexin Hives and Rash    Past Medical History, Surgical history, Social history, and Family History were reviewed and updated.   Physical Exam: Blood pressure (!) 128/56, pulse 80, temperature 98.4 F (36.9 C), temperature source Oral, resp. rate 18, height '5\' 10"'  (1.778 m), weight 193 lb 14.4 oz (88 kg), SpO2 97 %. ECOG: 1 General appearance: Alert, awake gentleman appeared comfortable. Head: Normocephalic, without obvious abnormality no oral ulcers or thrush. Neck: no adenopathy Lymph nodes: Cervical, supraclavicular, and axillary nodes normal. Heart:regular rate and rhythm, S1, S2 normal, no murmur, click, rub or gallop Lung:chest clear, no wheezing, rales, normal symmetric air entry Abdomin: soft, without masses or organomegaly. No shifting dullness or ascites. EXT: No upper extremity edema noted. He has 1+ edema noted bilateral lower extremities.   Lab Results: Lab Results  Component Value Date   WBC 5.8 10/24/2016   HGB 8.6 (L) 10/24/2016   HCT 27.5 (L) 10/24/2016   MCV 79.7 10/24/2016   PLT 319 10/24/2016     Chemistry      Component Value Date/Time   NA 141 10/24/2016 0907   K 4.3 10/24/2016 0907   CL 102 03/05/2016 1459   CO2 24 10/24/2016 0907   BUN 22.4 10/24/2016 0907   CREATININE 0.8 10/24/2016 0907   GLU 155 08/24/2015      Component Value Date/Time   CALCIUM 9.2 10/24/2016 0907   ALKPHOS 45 10/24/2016 0907   AST 16 10/24/2016 0907   ALT 9 10/24/2016 0907   BILITOT 0.27 10/24/2016 0907         Impression and Plan:   71 year old gentleman with the following issues:  1. 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 This was noted after presenting with symptoms of nausea, diarrhea and vomiting. He reports poor by mouth intake and weight loss. He does have history of prostate cancer Gleason score 5+4 = 9 resected in 2013.  He is status post biopsy  obtained on 03/14/2016 and the final pathology showed metastatic carcinoma that is poorly differentiated that favors prostate cancer. Although the specimen did not stain for PSA his tumor is poorly differentiated likely secretes very little to no PSA. His last PSA was 1.3.  He is currently receiving Taxotere salvage chemotherapy started on 03/30/2016.   He received 9 cycles of therapy with CT scan on 09/27/2016 showed progression of disease.   He underwent a biopsy of his peritoneal implants on 10/12/2016. The final pathology revealed a poorly differentiated tumor with neuroendocrine features.  The natural course of this disease was discussed with the patient and his wife. His tumor appears to have originated from the prostate and possibly dedifferentiated into a neuroendocrine tumor with small cell features. His behavior indicates a rather aggressive course that requires aggressive course of treatment.  The risks and benefits of using carboplatin and etoposide as a treatment regimen was discussed again today. These complications include nausea, fatigue, myelosuppression and infusion related complications. The benefit would  be palliation of symptoms and potentially achieving a objective response. He understands this malignancy is incurable and could be life-threatening if left untreated. He is agreeable to proceed with this treatment and we'll repeat imaging studies after 3 cycles of therapy. Anticipate he will need at least 6 cycles.   2. Abdominal pain: Resolved at this time. He is not taking any pain medication.  3. Antiemetics: Controlled at this time without complications. Antiemetics available to him for the new regimen.  4. IV access: Port-A-Cath placed and reports no recent complications.  5. Neutropenia prophylaxis: Neulasta will be needed for growth factor support.  6. Lower extremity edema: This is related to Taxotere chemotherapy. These to be on Lasix with improvement in his  symptoms.  7. Pruritus: Prescription for Atarax was given to patient.  8. Anemia: Related to malignancy and chemotherapy. He met require packed red cell transfusion between cycles of therapy.  9. Follow-up: Will be in 2 weeks to follow his progress and 3 weeks to start the second cycle of chemotherapy.   [facility], MD 6/18/201810:57 AM

## 2016-10-25 ENCOUNTER — Ambulatory Visit (HOSPITAL_BASED_OUTPATIENT_CLINIC_OR_DEPARTMENT_OTHER): Payer: PPO

## 2016-10-25 ENCOUNTER — Telehealth: Payer: Self-pay | Admitting: Oncology

## 2016-10-25 VITALS — BP 146/59 | HR 73 | Temp 97.7°F | Resp 18

## 2016-10-25 DIAGNOSIS — C61 Malignant neoplasm of prostate: Secondary | ICD-10-CM | POA: Diagnosis not present

## 2016-10-25 DIAGNOSIS — C786 Secondary malignant neoplasm of retroperitoneum and peritoneum: Secondary | ICD-10-CM

## 2016-10-25 DIAGNOSIS — Z5111 Encounter for antineoplastic chemotherapy: Secondary | ICD-10-CM | POA: Diagnosis not present

## 2016-10-25 MED ORDER — SODIUM CHLORIDE 0.9 % IV SOLN
10.0000 mg | Freq: Once | INTRAVENOUS | Status: AC
  Administered 2016-10-25: 10 mg via INTRAVENOUS
  Filled 2016-10-25: qty 1

## 2016-10-25 MED ORDER — SODIUM CHLORIDE 0.9 % IV SOLN
Freq: Once | INTRAVENOUS | Status: AC
Start: 2016-10-25 — End: 2016-10-25
  Administered 2016-10-25: 14:00:00 via INTRAVENOUS

## 2016-10-25 MED ORDER — SODIUM CHLORIDE 0.9 % IV SOLN
95.0000 mg/m2 | Freq: Once | INTRAVENOUS | Status: AC
  Administered 2016-10-25: 200 mg via INTRAVENOUS
  Filled 2016-10-25: qty 10

## 2016-10-25 MED ORDER — SODIUM CHLORIDE 0.9% FLUSH
10.0000 mL | INTRAVENOUS | Status: DC | PRN
  Administered 2016-10-25: 10 mL
  Filled 2016-10-25: qty 10

## 2016-10-25 MED ORDER — HEPARIN SOD (PORK) LOCK FLUSH 100 UNIT/ML IV SOLN
500.0000 [IU] | Freq: Once | INTRAVENOUS | Status: AC | PRN
  Administered 2016-10-25: 500 [IU]
  Filled 2016-10-25: qty 5

## 2016-10-25 NOTE — Telephone Encounter (Signed)
R/s appt per Su Grand - patients injections need to be 24 after treatment. Patient is aware.

## 2016-10-25 NOTE — Patient Instructions (Signed)
[facility] Discharge Instructions for Patients Receiving Chemotherapy  Today you received the following chemotherapy agents VP 16 (Etoposide) To help prevent nausea and vomiting after your treatment, we encourage you to take your nausea medication as prescribed.   If you develop nausea and vomiting that is not controlled by your nausea medication, call the clinic.   BELOW ARE SYMPTOMS THAT SHOULD BE REPORTED IMMEDIATELY:  *FEVER GREATER THAN 100.5 F  *CHILLS WITH OR WITHOUT FEVER  NAUSEA AND VOMITING THAT IS NOT CONTROLLED WITH YOUR NAUSEA MEDICATION  *UNUSUAL SHORTNESS OF BREATH  *UNUSUAL BRUISING OR BLEEDING  TENDERNESS IN MOUTH AND THROAT WITH OR WITHOUT PRESENCE OF ULCERS  *URINARY PROBLEMS  *BOWEL PROBLEMS  UNUSUAL RASH Items with * indicate a potential emergency and should be followed up as soon as possible.  Feel free to call the clinic you have any questions or concerns. The clinic phone number is [phone].  Please show the CHEMO ALERT CARD at check-in to the Emergency Department and triage nurse.   

## 2016-10-26 ENCOUNTER — Ambulatory Visit (HOSPITAL_BASED_OUTPATIENT_CLINIC_OR_DEPARTMENT_OTHER): Payer: PPO

## 2016-10-26 VITALS — BP 133/77 | HR 86 | Temp 98.0°F | Resp 18

## 2016-10-26 DIAGNOSIS — C786 Secondary malignant neoplasm of retroperitoneum and peritoneum: Secondary | ICD-10-CM

## 2016-10-26 DIAGNOSIS — C61 Malignant neoplasm of prostate: Secondary | ICD-10-CM | POA: Diagnosis not present

## 2016-10-26 DIAGNOSIS — Z5111 Encounter for antineoplastic chemotherapy: Secondary | ICD-10-CM

## 2016-10-26 MED ORDER — SODIUM CHLORIDE 0.9 % IV SOLN
10.0000 mg | Freq: Once | INTRAVENOUS | Status: AC
  Administered 2016-10-26: 10 mg via INTRAVENOUS
  Filled 2016-10-26: qty 1

## 2016-10-26 MED ORDER — ETOPOSIDE CHEMO INJECTION 1 GM/50ML
96.0000 mg/m2 | Freq: Once | INTRAVENOUS | Status: AC
  Administered 2016-10-26: 200 mg via INTRAVENOUS
  Filled 2016-10-26: qty 10

## 2016-10-26 MED ORDER — SODIUM CHLORIDE 0.9% FLUSH
10.0000 mL | INTRAVENOUS | Status: DC | PRN
  Administered 2016-10-26: 10 mL
  Filled 2016-10-26: qty 10

## 2016-10-26 MED ORDER — HEPARIN SOD (PORK) LOCK FLUSH 100 UNIT/ML IV SOLN
500.0000 [IU] | Freq: Once | INTRAVENOUS | Status: AC | PRN
  Administered 2016-10-26: 500 [IU]
  Filled 2016-10-26: qty 5

## 2016-10-26 MED ORDER — SODIUM CHLORIDE 0.9 % IV SOLN
Freq: Once | INTRAVENOUS | Status: AC
  Administered 2016-10-26: 13:00:00 via INTRAVENOUS

## 2016-10-26 NOTE — Patient Instructions (Signed)
[facility] Discharge Instructions for Patients Receiving Chemotherapy  Today you received the following chemotherapy agents Etoposide (VP 16) To help prevent nausea and vomiting after your treatment, we encourage you to take your nausea medication as prescribed.If you develop nausea and vomiting that is not controlled by your nausea medication, call the clinic.   BELOW ARE SYMPTOMS THAT SHOULD BE REPORTED IMMEDIATELY:  *FEVER GREATER THAN 100.5 F  *CHILLS WITH OR WITHOUT FEVER  NAUSEA AND VOMITING THAT IS NOT CONTROLLED WITH YOUR NAUSEA MEDICATION  *UNUSUAL SHORTNESS OF BREATH  *UNUSUAL BRUISING OR BLEEDING  TENDERNESS IN MOUTH AND THROAT WITH OR WITHOUT PRESENCE OF ULCERS  *URINARY PROBLEMS  *BOWEL PROBLEMS  UNUSUAL RASH Items with * indicate a potential emergency and should be followed up as soon as possible.  Feel free to call the clinic you have any questions or concerns. The clinic phone number is [phone].  Please show the CHEMO ALERT CARD at check-in to the Emergency Department and triage nurse. 

## 2016-10-27 ENCOUNTER — Ambulatory Visit (HOSPITAL_BASED_OUTPATIENT_CLINIC_OR_DEPARTMENT_OTHER): Payer: PPO

## 2016-10-27 ENCOUNTER — Telehealth: Payer: Self-pay | Admitting: Oncology

## 2016-10-27 VITALS — BP 136/58 | HR 72 | Temp 97.1°F | Resp 18

## 2016-10-27 DIAGNOSIS — C61 Malignant neoplasm of prostate: Secondary | ICD-10-CM | POA: Diagnosis not present

## 2016-10-27 DIAGNOSIS — Z5189 Encounter for other specified aftercare: Secondary | ICD-10-CM

## 2016-10-27 MED ORDER — PEGFILGRASTIM INJECTION 6 MG/0.6ML ~~LOC~~
6.0000 mg | PREFILLED_SYRINGE | Freq: Once | SUBCUTANEOUS | Status: AC
  Administered 2016-10-27: 6 mg via SUBCUTANEOUS
  Filled 2016-10-27: qty 0.6

## 2016-10-27 NOTE — Telephone Encounter (Signed)
Copy of updated schedule given to patient.

## 2016-10-31 ENCOUNTER — Encounter (HOSPITAL_COMMUNITY): Payer: Self-pay

## 2016-10-31 ENCOUNTER — Encounter: Payer: Self-pay | Admitting: *Deleted

## 2016-10-31 ENCOUNTER — Encounter: Payer: Self-pay | Admitting: Oncology

## 2016-11-07 ENCOUNTER — Ambulatory Visit (HOSPITAL_COMMUNITY)
Admission: RE | Admit: 2016-11-07 | Discharge: 2016-11-07 | Disposition: A | Discharge status: Home / Self Care | Payer: PPO | Source: Ambulatory Visit | Attending: Oncology | Admitting: Oncology

## 2016-11-07 ENCOUNTER — Telehealth: Payer: Self-pay | Admitting: Oncology

## 2016-11-07 ENCOUNTER — Ambulatory Visit (HOSPITAL_BASED_OUTPATIENT_CLINIC_OR_DEPARTMENT_OTHER): Payer: PPO | Admitting: Oncology

## 2016-11-07 ENCOUNTER — Other Ambulatory Visit (HOSPITAL_BASED_OUTPATIENT_CLINIC_OR_DEPARTMENT_OTHER): Payer: PPO

## 2016-11-07 VITALS — BP 132/49 | HR 78 | Temp 97.9°F | Resp 18 | Ht 70.0 in | Wt 186.1 lb

## 2016-11-07 DIAGNOSIS — C786 Secondary malignant neoplasm of retroperitoneum and peritoneum: Secondary | ICD-10-CM | POA: Diagnosis not present

## 2016-11-07 DIAGNOSIS — D638 Anemia in other chronic diseases classified elsewhere: Secondary | ICD-10-CM | POA: Diagnosis not present

## 2016-11-07 DIAGNOSIS — Z7984 Long term (current) use of oral hypoglycemic drugs: Secondary | ICD-10-CM | POA: Diagnosis not present

## 2016-11-07 DIAGNOSIS — Y929 Unspecified place or not applicable: Secondary | ICD-10-CM | POA: Insufficient documentation

## 2016-11-07 DIAGNOSIS — Z91013 Allergy to seafood: Secondary | ICD-10-CM | POA: Diagnosis not present

## 2016-11-07 DIAGNOSIS — R600 Localized edema: Secondary | ICD-10-CM

## 2016-11-07 DIAGNOSIS — C61 Malignant neoplasm of prostate: Secondary | ICD-10-CM | POA: Diagnosis not present

## 2016-11-07 DIAGNOSIS — D6481 Anemia due to antineoplastic chemotherapy: Secondary | ICD-10-CM

## 2016-11-07 DIAGNOSIS — R609 Edema, unspecified: Secondary | ICD-10-CM | POA: Insufficient documentation

## 2016-11-07 DIAGNOSIS — T451X5A Adverse effect of antineoplastic and immunosuppressive drugs, initial encounter: Secondary | ICD-10-CM | POA: Diagnosis not present

## 2016-11-07 DIAGNOSIS — L299 Pruritus, unspecified: Secondary | ICD-10-CM | POA: Diagnosis not present

## 2016-11-07 DIAGNOSIS — Z888 Allergy status to other drugs, medicaments and biological substances status: Secondary | ICD-10-CM | POA: Diagnosis not present

## 2016-11-07 DIAGNOSIS — Z79899 Other long term (current) drug therapy: Secondary | ICD-10-CM | POA: Diagnosis not present

## 2016-11-07 DIAGNOSIS — D630 Anemia in neoplastic disease: Secondary | ICD-10-CM | POA: Diagnosis not present

## 2016-11-07 LAB — COMPREHENSIVE METABOLIC PANEL
ALBUMIN: 3.3 g/dL — AB (ref 3.5–5.0)
ALT: 14 U/L (ref 0–55)
AST: 14 U/L (ref 5–34)
Alkaline Phosphatase: 60 U/L (ref 40–150)
Anion Gap: 11 mEq/L (ref 3–11)
BUN: 14.8 mg/dL (ref 7.0–26.0)
CHLORIDE: 107 meq/L (ref 98–109)
CO2: 26 mEq/L (ref 22–29)
Calcium: 8.9 mg/dL (ref 8.4–10.4)
Creatinine: 0.8 mg/dL (ref 0.7–1.3)
EGFR: 89 mL/min/{1.73_m2} — ABNORMAL LOW (ref >90)
GLUCOSE: 196 mg/dL — AB (ref 70–140)
POTASSIUM: 3.8 meq/L (ref 3.5–5.1)
SODIUM: 144 meq/L (ref 136–145)
Total Bilirubin: 0.27 mg/dL (ref 0.20–1.20)
Total Protein: 6 g/dL — ABNORMAL LOW (ref 6.4–8.3)

## 2016-11-07 LAB — CBC WITH DIFFERENTIAL/PLATELET
BASO%: 1.3 % (ref 0.0–2.0)
BASOS ABS: 0 10*3/uL (ref 0.0–0.1)
EOS%: 3.7 % (ref 0.0–7.0)
Eosinophils Absolute: 0.1 10*3/uL (ref 0.0–0.5)
HCT: 24.9 % — ABNORMAL LOW (ref 38.4–49.9)
HEMOGLOBIN: 7.8 g/dL — AB (ref 13.0–17.1)
LYMPH%: 21.3 % (ref 14.0–49.0)
MCH: 24.8 pg — AB (ref 27.2–33.4)
MCHC: 31.2 g/dL — ABNORMAL LOW (ref 32.0–36.0)
MCV: 79.7 fL (ref 79.3–98.0)
MONO#: 0.3 10*3/uL (ref 0.1–0.9)
MONO%: 10.4 % (ref 0.0–14.0)
NEUT#: 1.7 10*3/uL (ref 1.5–6.5)
NEUT%: 63.3 % (ref 39.0–75.0)
Platelets: 146 10*3/uL (ref 140–400)
RBC: 3.12 10*6/uL — ABNORMAL LOW (ref 4.20–5.82)
RDW: 20.7 % — AB (ref 11.0–14.6)
WBC: 2.6 10*3/uL — AB (ref 4.0–10.3)
lymph#: 0.6 10*3/uL — ABNORMAL LOW (ref 0.9–3.3)

## 2016-11-07 LAB — PREPARE RBC (CROSSMATCH)

## 2016-11-07 NOTE — Progress Notes (Signed)
Hematology and Oncology Follow Up Visit  Frank Larsen [id number] [birth year] 71 y.o. 11/07/2016 10:36 AM Frank Larsen, Frank Brow, MD   Principle Diagnosis: 71 year old gentleman with prostate cancer diagnosed in 2013 after presenting with a PSA of 3.6 and a Gleason score 5+4 = 9. He has peritoneal carcinomatosis biopsy proven to be adenocarcinoma likely of a prostate primary.   Prior Therapy: He is status post robotic-assisted laparoscopic radical prostatectomy and extended lymphadenectomy with the pathology reveals T2N0.  completed in June 2013. Taxotere chemotherapy at 75 mg/m every 3 weeks started on 03/30/2016. He is status post 9 cycles of therapy.  He developed relapsed disease and a biopsy proven to show neuroendocrine poorly differentiated tumor in June 2018.  Current therapy: Carboplatin and etoposide. Cycle 1 given on 10/24/2016. He is here for evaluation for cycle 2.  Interim History: [name] presents today for a follow-up visit. Since the last visit, he reports improvement in some of his symptoms. His itching has resolved at this time without any rash. Atarax appears to have helped his symptoms. He tolerated the chemotherapy without complications. He denied any infusion-related complications nausea or vomiting. He does report some fatigue but no dyspnea on exertion. His edema has also improved. His appetite have been excellent.  He does not report any headaches, blurry vision, syncope or seizures. He does not report any fevers, chills, sweats. He does not report any chest pain, palpitation, orthopnea or leg edema. He does not report any cough, wheezing or hemoptysis. He does not report any hematochezia, melena or diarrhea at this time. He has not moved his bowels last few days. He does not report any frequency, urgency or hematuria. He does not report any skeletal complaints. Remaining review of systems and   Medications: I have reviewed the patient's current medications.   Current Outpatient Prescriptions  Medication Sig Dispense Refill  . atorvastatin (LIPITOR) 10 MG tablet Take 10 mg by mouth daily with breakfast.     . Blood Glucose Monitoring Suppl (ONE TOUCH ULTRA 2) w/Device KIT Use to check blood sugar 2 times per day. 1 each 2  . furosemide (LASIX) 20 MG tablet Take 1 tablet (20 mg total) by mouth daily. 30 tablet 1  . glipiZIDE (GLUCOTROL XL) 5 MG 24 hr tablet Take 1 tablet (5 mg total) by mouth daily with breakfast. 90 tablet 2  . glucose blood (ONE TOUCH ULTRA TEST) test strip Use to check blood sugar 2 times per day. 200 each 5  . hydrOXYzine (ATARAX/VISTARIL) 10 MG tablet Take 1 tablet (10 mg total) by mouth every 6 (six) hours as needed. 60 tablet 0  . lidocaine-prilocaine (EMLA) cream Apply 1 application topically as needed. Apply to port before chemotherapy. 30 g 0  . metFORMIN (GLUCOPHAGE) 1000 MG tablet Take 1 tablet (1,000 mg total) by mouth 2 (two) times daily with a meal. 180 tablet 3  . Multiple Vitamin (MULTIVITAMIN) tablet Take 1 tablet by mouth daily.    [name] omeprazole (PRILOSEC) 20 MG capsule Take 20 mg by mouth every morning.    [name] DELICA LANCETS 30Q MISC Use to check blood sugar 2 times per day. 200 each 2  . prochlorperazine (COMPAZINE) 10 MG tablet Take 1 tablet (10 mg total) by mouth every 6 (six) hours as needed for nausea or vomiting. 30 tablet 0  . pseudoephedrine-acetaminophen (TYLENOL SINUS) 30-500 MG TABS tablet Take 1 tablet by mouth every 4 (four) hours as needed (congestion).     . triamcinolone cream (  KENALOG) 0.1 %      No current facility-administered medications for this visit.      Allergies:  Allergies  Allergen Reactions  . Scallops [Shellfish Allergy] Nausea And Vomiting  . Cephalexin Hives and Rash    Past Medical History, Surgical history, Social history, and Family History were reviewed and updated.   Physical Exam: Blood pressure (!) 132/49, pulse 78, temperature 97.9 F (36.6 C), temperature  source Oral, resp. rate 18, height _0  (1.778 m), weight 186 lb 1.6 oz (84.4 kg), SpO2 98 %. ECOG: 1 General appearance: Well-appearing gentleman without distress. Appeared pale. Head: Normocephalic, without obvious abnormality no oral ulcers or lesions. Neck: no adenopathy Lymph nodes: Cervical, supraclavicular, and axillary nodes normal. Heart:regular rate and rhythm, S1, S2 normal, no murmur, click, rub or gallop Lung:chest clear, no wheezing, rales, normal symmetric air entry Abdomin: soft, without masses or organomegaly. No shifting dullness or ascites. EXT: No upper extremity edema noted. Lower extremity edema improved.   Lab Results: Lab Results  Component Value Date   WBC 2.6 (L) 11/07/2016   HGB 7.8 (L) 11/07/2016   HCT 24.9 (L) 11/07/2016   MCV 79.7 11/07/2016   PLT 146 11/07/2016     Chemistry      Component Value Date/Time   NA 141 10/24/2016 0907   K 4.3 10/24/2016 0907   CL 102 03/05/2016 1459   CO2 24 10/24/2016 0907   BUN 22.4 10/24/2016 0907   CREATININE 0.8 10/24/2016 0907   GLU 155 08/24/2015      Component Value Date/Time   CALCIUM 9.2 10/24/2016 0907   ALKPHOS 45 10/24/2016 0907   AST 16 10/24/2016 0907   ALT 9 10/24/2016 0907   BILITOT 0.27 10/24/2016 0907         Impression and Plan:   71 year old gentleman with the following issues:  1. 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 This was noted after presenting with symptoms of nausea, diarrhea and vomiting. He reports poor by mouth intake and weight loss. He does have history of prostate cancer Gleason score 5+4 = 9 resected in 2013.  He is status post biopsy obtained on 03/14/2016 and the final pathology showed metastatic carcinoma that is poorly differentiated that favors prostate cancer. Although the specimen did not stain for PSA his tumor is poorly differentiated likely secretes very little to no PSA. His last PSA was 1.3.  He is S/P Taxotere salvage chemotherapy  started on 03/30/2016. He received 9 cycles of therapy with CT scan on 09/27/2016 showed progression of disease.   He underwent a biopsy of his peritoneal implants on 10/12/2016. The final pathology revealed a poorly differentiated tumor with neuroendocrine features.  He is currently receiving carboplatin and etoposide and tolerated the first cycle well. The plan is to proceed with cycle 2 without any dose reduction or delay to be started on 11/14/2016. He'll have chemotherapy on 11/14/2016, 11/15/2016 and 11/16/2016. Injection of Neulasta will be after each cycle.   2. Abdominal pain: Resolved at this time. He is not taking any pain medication.  3. Antiemetics: Controlled at this time without complications. Antiemetics available to him for the new regimen.  4. IV access: Port-A-Cath placed and reports no recent complications.  5. Neutropenia prophylaxis: Neulasta will be needed for growth factor support.  6. Lower extremity edema: This is related to Taxotere chemotherapy. His edema continues to improve.  7. Pruritus: Prescription for Atarax was given to patient. These symptoms have resolved.  8. Anemia:  Related to malignancy and chemotherapy. He is anemic and symptomatic. We will set up at 2 units of packed red cells to be transfused in the immediate future.  9. Follow-up: Will be next week with chemotherapy.   Zola Button, MD 7/2/201810:36 AM

## 2016-11-07 NOTE — Telephone Encounter (Signed)
Scheduled appt per 7/2 los - Gave patient AVS and calender per los.  

## 2016-11-08 ENCOUNTER — Ambulatory Visit (HOSPITAL_BASED_OUTPATIENT_CLINIC_OR_DEPARTMENT_OTHER): Payer: PPO

## 2016-11-08 VITALS — BP 143/64 | HR 68 | Temp 98.0°F | Resp 18

## 2016-11-08 DIAGNOSIS — C61 Malignant neoplasm of prostate: Secondary | ICD-10-CM | POA: Diagnosis not present

## 2016-11-08 DIAGNOSIS — Z95828 Presence of other vascular implants and grafts: Secondary | ICD-10-CM

## 2016-11-08 MED ORDER — ACETAMINOPHEN 325 MG PO TABS
ORAL_TABLET | ORAL | Status: AC
  Filled 2016-11-08: qty 2

## 2016-11-08 MED ORDER — HEPARIN SOD (PORK) LOCK FLUSH 100 UNIT/ML IV SOLN
500.0000 [IU] | Freq: Once | INTRAVENOUS | Status: AC | PRN
  Administered 2016-11-08: 500 [IU] via INTRAVENOUS
  Filled 2016-11-08: qty 5

## 2016-11-08 MED ORDER — SODIUM CHLORIDE 0.9% FLUSH
10.0000 mL | INTRAVENOUS | Status: AC | PRN
  Administered 2016-11-08: 10 mL
  Filled 2016-11-08: qty 10

## 2016-11-08 MED ORDER — DIPHENHYDRAMINE HCL 25 MG PO CAPS
ORAL_CAPSULE | ORAL | Status: AC
  Filled 2016-11-08: qty 1

## 2016-11-08 MED ORDER — ACETAMINOPHEN 325 MG PO TABS
650.0000 mg | ORAL_TABLET | Freq: Once | ORAL | Status: AC
  Administered 2016-11-08: 650 mg via ORAL

## 2016-11-08 MED ORDER — DIPHENHYDRAMINE HCL 25 MG PO CAPS
25.0000 mg | ORAL_CAPSULE | Freq: Once | ORAL | Status: AC
  Administered 2016-11-08: 25 mg via ORAL

## 2016-11-08 MED ORDER — SODIUM CHLORIDE 0.9 % IV SOLN
250.0000 mL | Freq: Once | INTRAVENOUS | Status: AC
Start: 2016-11-08 — End: 2016-11-08
  Administered 2016-11-08: 250 mL via INTRAVENOUS

## 2016-11-08 MED ORDER — HEPARIN SOD (PORK) LOCK FLUSH 100 UNIT/ML IV SOLN
250.0000 [IU] | INTRAVENOUS | Status: DC | PRN
  Filled 2016-11-08: qty 5

## 2016-11-09 LAB — BPAM RBC
BLOOD PRODUCT EXPIRATION DATE: 201807142359
Blood Product Expiration Date: 201807142359
ISSUE DATE / TIME: 201807030822
ISSUE DATE / TIME: 201807030822
UNIT TYPE AND RH: 6200
UNIT TYPE AND RH: 6200

## 2016-11-09 LAB — TYPE AND SCREEN
ABO/RH(D): A POS
ANTIBODY SCREEN: NEGATIVE
Unit division: 0
Unit division: 0

## 2016-11-14 ENCOUNTER — Ambulatory Visit: Payer: PPO

## 2016-11-14 ENCOUNTER — Other Ambulatory Visit (HOSPITAL_BASED_OUTPATIENT_CLINIC_OR_DEPARTMENT_OTHER): Payer: PPO

## 2016-11-14 ENCOUNTER — Ambulatory Visit (HOSPITAL_BASED_OUTPATIENT_CLINIC_OR_DEPARTMENT_OTHER): Payer: PPO

## 2016-11-14 VITALS — BP 154/49 | HR 66 | Temp 98.0°F | Resp 18

## 2016-11-14 DIAGNOSIS — C61 Malignant neoplasm of prostate: Secondary | ICD-10-CM

## 2016-11-14 DIAGNOSIS — Z5111 Encounter for antineoplastic chemotherapy: Secondary | ICD-10-CM | POA: Diagnosis not present

## 2016-11-14 LAB — CBC WITH DIFFERENTIAL/PLATELET
BASO%: 0.7 % (ref 0.0–2.0)
Basophils Absolute: 0.1 10*3/uL (ref 0.0–0.1)
EOS%: 1.1 % (ref 0.0–7.0)
Eosinophils Absolute: 0.1 10*3/uL (ref 0.0–0.5)
HCT: 34.7 % — ABNORMAL LOW (ref 38.4–49.9)
HGB: 11 g/dL — ABNORMAL LOW (ref 13.0–17.1)
LYMPH%: 9.4 % — AB (ref 14.0–49.0)
MCH: 26.4 pg — ABNORMAL LOW (ref 27.2–33.4)
MCHC: 31.6 g/dL — ABNORMAL LOW (ref 32.0–36.0)
MCV: 83.5 fL (ref 79.3–98.0)
MONO#: 0.7 10*3/uL (ref 0.1–0.9)
MONO%: 8.9 % (ref 0.0–14.0)
NEUT%: 79.9 % — ABNORMAL HIGH (ref 39.0–75.0)
NEUTROS ABS: 6.3 10*3/uL (ref 1.5–6.5)
Platelets: 486 10*3/uL — ABNORMAL HIGH (ref 140–400)
RBC: 4.16 10*6/uL — AB (ref 4.20–5.82)
RDW: 22 % — ABNORMAL HIGH (ref 11.0–14.6)
WBC: 7.9 10*3/uL (ref 4.0–10.3)
lymph#: 0.7 10*3/uL — ABNORMAL LOW (ref 0.9–3.3)

## 2016-11-14 LAB — COMPREHENSIVE METABOLIC PANEL
ALT: 11 U/L (ref 0–55)
AST: 12 U/L (ref 5–34)
Albumin: 3.4 g/dL — ABNORMAL LOW (ref 3.5–5.0)
Alkaline Phosphatase: 67 U/L (ref 40–150)
Anion Gap: 9 mEq/L (ref 3–11)
BILIRUBIN TOTAL: 0.22 mg/dL (ref 0.20–1.20)
BUN: 11.4 mg/dL (ref 7.0–26.0)
CO2: 26 meq/L (ref 22–29)
CREATININE: 0.8 mg/dL (ref 0.7–1.3)
Calcium: 9 mg/dL (ref 8.4–10.4)
Chloride: 106 mEq/L (ref 98–109)
EGFR: >90 mL/min/{1.73_m2} (ref >90)
GLUCOSE: 158 mg/dL — AB (ref 70–140)
Potassium: 3.9 mEq/L (ref 3.5–5.1)
SODIUM: 141 meq/L (ref 136–145)
TOTAL PROTEIN: 6.2 g/dL — AB (ref 6.4–8.3)

## 2016-11-14 MED ORDER — SODIUM CHLORIDE 0.9 % IV SOLN
95.0000 mg/m2 | Freq: Once | INTRAVENOUS | Status: AC
  Administered 2016-11-14: 200 mg via INTRAVENOUS
  Filled 2016-11-14: qty 10

## 2016-11-14 MED ORDER — PALONOSETRON HCL INJECTION 0.25 MG/5ML
INTRAVENOUS | Status: AC
  Filled 2016-11-14: qty 5

## 2016-11-14 MED ORDER — PALONOSETRON HCL INJECTION 0.25 MG/5ML
0.2500 mg | Freq: Once | INTRAVENOUS | Status: AC
Start: 2016-11-14 — End: 2016-11-14
  Administered 2016-11-14: 0.25 mg via INTRAVENOUS

## 2016-11-14 MED ORDER — HEPARIN SOD (PORK) LOCK FLUSH 100 UNIT/ML IV SOLN
500.0000 [IU] | Freq: Once | INTRAVENOUS | Status: AC | PRN
  Administered 2016-11-14: 500 [IU]
  Filled 2016-11-14: qty 5

## 2016-11-14 MED ORDER — SODIUM CHLORIDE 0.9 % IV SOLN
Freq: Once | INTRAVENOUS | Status: AC
  Administered 2016-11-14: 10:00:00 via INTRAVENOUS

## 2016-11-14 MED ORDER — SODIUM CHLORIDE 0.9 % IV SOLN
566.5000 mg | Freq: Once | INTRAVENOUS | Status: AC
  Administered 2016-11-14: 570 mg via INTRAVENOUS
  Filled 2016-11-14: qty 57

## 2016-11-14 MED ORDER — SODIUM CHLORIDE 0.9% FLUSH
10.0000 mL | INTRAVENOUS | Status: DC | PRN
  Administered 2016-11-14: 10 mL
  Filled 2016-11-14: qty 10

## 2016-11-14 MED ORDER — DEXAMETHASONE SODIUM PHOSPHATE 100 MG/10ML IJ SOLN
10.0000 mg | Freq: Once | INTRAMUSCULAR | Status: AC
  Administered 2016-11-14: 10 mg via INTRAVENOUS
  Filled 2016-11-14: qty 1

## 2016-11-14 NOTE — Progress Notes (Signed)
Opened chart in error.

## 2016-11-15 ENCOUNTER — Ambulatory Visit (HOSPITAL_BASED_OUTPATIENT_CLINIC_OR_DEPARTMENT_OTHER): Payer: PPO

## 2016-11-15 VITALS — BP 135/84 | HR 78 | Temp 97.9°F | Resp 18

## 2016-11-15 DIAGNOSIS — C61 Malignant neoplasm of prostate: Secondary | ICD-10-CM

## 2016-11-15 DIAGNOSIS — Z5111 Encounter for antineoplastic chemotherapy: Secondary | ICD-10-CM | POA: Diagnosis not present

## 2016-11-15 MED ORDER — SODIUM CHLORIDE 0.9 % IV SOLN
10.0000 mg | Freq: Once | INTRAVENOUS | Status: AC
  Administered 2016-11-15: 10 mg via INTRAVENOUS
  Filled 2016-11-15: qty 1

## 2016-11-15 MED ORDER — DEXAMETHASONE SODIUM PHOSPHATE 10 MG/ML IJ SOLN
INTRAMUSCULAR | Status: AC
  Filled 2016-11-15: qty 1

## 2016-11-15 MED ORDER — SODIUM CHLORIDE 0.9 % IV SOLN
Freq: Once | INTRAVENOUS | Status: AC
  Administered 2016-11-15: 09:00:00 via INTRAVENOUS

## 2016-11-15 MED ORDER — HEPARIN SOD (PORK) LOCK FLUSH 100 UNIT/ML IV SOLN
500.0000 [IU] | Freq: Once | INTRAVENOUS | Status: AC | PRN
  Administered 2016-11-15: 500 [IU]
  Filled 2016-11-15: qty 5

## 2016-11-15 MED ORDER — ETOPOSIDE CHEMO INJECTION 1 GM/50ML
95.0000 mg/m2 | Freq: Once | INTRAVENOUS | Status: AC
  Administered 2016-11-15: 200 mg via INTRAVENOUS
  Filled 2016-11-15: qty 10

## 2016-11-15 MED ORDER — SODIUM CHLORIDE 0.9% FLUSH
10.0000 mL | INTRAVENOUS | Status: DC | PRN
  Administered 2016-11-15: 10 mL
  Filled 2016-11-15: qty 10

## 2016-11-16 ENCOUNTER — Encounter: Payer: Self-pay | Admitting: Oncology

## 2016-11-16 ENCOUNTER — Ambulatory Visit (HOSPITAL_BASED_OUTPATIENT_CLINIC_OR_DEPARTMENT_OTHER): Payer: PPO | Admitting: Oncology

## 2016-11-16 ENCOUNTER — Ambulatory Visit: Payer: PPO

## 2016-11-16 ENCOUNTER — Ambulatory Visit (HOSPITAL_BASED_OUTPATIENT_CLINIC_OR_DEPARTMENT_OTHER): Payer: PPO

## 2016-11-16 VITALS — BP 139/59 | HR 74 | Temp 98.6°F | Resp 18 | Ht 70.0 in | Wt 184.5 lb

## 2016-11-16 DIAGNOSIS — C61 Malignant neoplasm of prostate: Secondary | ICD-10-CM

## 2016-11-16 DIAGNOSIS — R600 Localized edema: Secondary | ICD-10-CM

## 2016-11-16 DIAGNOSIS — Z7189 Other specified counseling: Secondary | ICD-10-CM | POA: Insufficient documentation

## 2016-11-16 DIAGNOSIS — C786 Secondary malignant neoplasm of retroperitoneum and peritoneum: Secondary | ICD-10-CM

## 2016-11-16 DIAGNOSIS — D630 Anemia in neoplastic disease: Secondary | ICD-10-CM

## 2016-11-16 DIAGNOSIS — Z5111 Encounter for antineoplastic chemotherapy: Secondary | ICD-10-CM

## 2016-11-16 DIAGNOSIS — D6481 Anemia due to antineoplastic chemotherapy: Secondary | ICD-10-CM | POA: Diagnosis not present

## 2016-11-16 MED ORDER — HEPARIN SOD (PORK) LOCK FLUSH 100 UNIT/ML IV SOLN
500.0000 [IU] | Freq: Once | INTRAVENOUS | Status: AC | PRN
  Administered 2016-11-16: 500 [IU]
  Filled 2016-11-16: qty 5

## 2016-11-16 MED ORDER — SODIUM CHLORIDE 0.9 % IV SOLN
10.0000 mg | Freq: Once | INTRAVENOUS | Status: AC
  Administered 2016-11-16: 10 mg via INTRAVENOUS
  Filled 2016-11-16: qty 1

## 2016-11-16 MED ORDER — SODIUM CHLORIDE 0.9% FLUSH
10.0000 mL | INTRAVENOUS | Status: DC | PRN
  Administered 2016-11-16: 10 mL
  Filled 2016-11-16: qty 10

## 2016-11-16 MED ORDER — SODIUM CHLORIDE 0.9 % IV SOLN
95.0000 mg/m2 | Freq: Once | INTRAVENOUS | Status: AC
  Administered 2016-11-16: 200 mg via INTRAVENOUS
  Filled 2016-11-16: qty 10

## 2016-11-16 MED ORDER — DEXAMETHASONE SODIUM PHOSPHATE 10 MG/ML IJ SOLN
INTRAMUSCULAR | Status: AC
  Filled 2016-11-16: qty 1

## 2016-11-16 MED ORDER — SODIUM CHLORIDE 0.9 % IV SOLN
Freq: Once | INTRAVENOUS | Status: AC
  Administered 2016-11-16: 13:00:00 via INTRAVENOUS

## 2016-11-16 NOTE — Patient Instructions (Signed)
[facility] Discharge Instructions for Patients Receiving Chemotherapy  Today you received the following chemotherapy agents: Etoposide   To help prevent nausea and vomiting after your treatment, we encourage you to take your nausea medication as directed.    If you develop nausea and vomiting that is not controlled by your nausea medication, call the clinic.   BELOW ARE SYMPTOMS THAT SHOULD BE REPORTED IMMEDIATELY:  *FEVER GREATER THAN 100.5 F  *CHILLS WITH OR WITHOUT FEVER  NAUSEA AND VOMITING THAT IS NOT CONTROLLED WITH YOUR NAUSEA MEDICATION  *UNUSUAL SHORTNESS OF BREATH  *UNUSUAL BRUISING OR BLEEDING  TENDERNESS IN MOUTH AND THROAT WITH OR WITHOUT PRESENCE OF ULCERS  *URINARY PROBLEMS  *BOWEL PROBLEMS  UNUSUAL RASH Items with * indicate a potential emergency and should be followed up as soon as possible.  Feel free to call the clinic you have any questions or concerns. The clinic phone number is [phone].  Please show the CHEMO ALERT CARD at check-in to the Emergency Department and triage nurse.   

## 2016-11-16 NOTE — Progress Notes (Signed)
Hematology and Oncology Follow Up Visit  Frank Larsen [id number] [birth year] 71 y.o. 11/16/2016 4:47 PM Frank Larsen, Frank Brow, MD   Principle Diagnosis: 71 year old gentleman with prostate cancer diagnosed in 2013 after presenting with a PSA of 3.6 and a Gleason score 5+4 = 9. He has peritoneal carcinomatosis biopsy proven to be adenocarcinoma likely of a prostate primary.  Prior Therapy: He is status post robotic-assisted laparoscopic radical prostatectomy and extended lymphadenectomy with the pathology reveals T2N0.  completed in June 2013. Taxotere chemotherapy at 75 mg/m every 3 weeks started on 03/30/2016. He is status post 9 cycles of therapy.  He developed relapsed disease and a biopsy proven to show neuroendocrine poorly differentiated tumor in June 2018.  Current therapy: Carboplatin and etoposide. Cycle 1 given on 10/24/2016. He is here for evaluation for cycle 2.  Interim History:  Frank Larsen presents today for follow-up visit. He is currently receiving cycle 2 day 3 of his chemotherapy scheduled for today. His itching has improved significantly with the use of Atarax. He denies having any rash. He continues to tolerate his chemotherapy without any complications. He denies nausea and vomiting. He has some mild fatigue. Denies chest pain and shortness of breath. His edema continues to improve. His appetite is good and he is not losing any weight.  He denies headaches, blurred vision, syncope, and seizures. He denies fevers, chills, night sweats. He denies chest pain, shortness of breath, palpitations, orthopnea. He denies cough, wheezing, hemoptysis. He does not report any hematochezia, melena, or diarrhea. Denies urinary frequency, urgency, and hematuria. He does not report any skeletal complaints. The remaining review of systems is unremarkable.  Medications: I have reviewed the patient's current medications.  Current Outpatient Prescriptions  Medication Sig Dispense  Refill  . atorvastatin (LIPITOR) 10 MG tablet Take 10 mg by mouth daily with breakfast.     . Blood Glucose Monitoring Suppl (ONE TOUCH ULTRA 2) w/Device KIT Use to check blood sugar 2 times per day. 1 each 2  . furosemide (LASIX) 20 MG tablet Take 1 tablet (20 mg total) by mouth daily. 30 tablet 1  . glipiZIDE (GLUCOTROL XL) 5 MG 24 hr tablet Take 1 tablet (5 mg total) by mouth daily with breakfast. 90 tablet 2  . glucose blood (ONE TOUCH ULTRA TEST) test strip Use to check blood sugar 2 times per day. 200 each 5  . hydrOXYzine (ATARAX/VISTARIL) 10 MG tablet Take 1 tablet (10 mg total) by mouth every 6 (six) hours as needed. 60 tablet 0  . lidocaine-prilocaine (EMLA) cream Apply 1 application topically as needed. Apply to port before chemotherapy. 30 g 0  . metFORMIN (GLUCOPHAGE) 1000 MG tablet Take 1 tablet (1,000 mg total) by mouth 2 (two) times daily with a meal. 180 tablet 3  . Multiple Vitamin (MULTIVITAMIN) tablet Take 1 tablet by mouth daily.    [name] omeprazole (PRILOSEC) 20 MG capsule Take 20 mg by mouth every morning.    [name] DELICA LANCETS 32G MISC Use to check blood sugar 2 times per day. 200 each 2  . prochlorperazine (COMPAZINE) 10 MG tablet Take 1 tablet (10 mg total) by mouth every 6 (six) hours as needed for nausea or vomiting. 30 tablet 0  . pseudoephedrine-acetaminophen (TYLENOL SINUS) 30-500 MG TABS tablet Take 1 tablet by mouth every 4 (four) hours as needed (congestion).      No current facility-administered medications for this visit.    Facility-Administered Medications Ordered in Other Visits  Medication Dose Route Frequency  Provider Last Rate Last Dose  . sodium chloride flush (NS) 0.9 % injection 10 mL  10 mL Intracatheter PRN Wyatt Portela, MD   10 mL at 11/16/16 1523     Allergies:  Allergies  Allergen Reactions  . Scallops [Shellfish Allergy] Nausea And Vomiting  . Cephalexin Hives and Rash    Past Medical History, Surgical history, Social history, and  Family History were reviewed and updated.  Review of Systems: Constitutional:  Negative for fever, chills, night sweats, anorexia, weight loss, pain. Cardiovascular: no chest pain or dyspnea on exertion Respiratory: no cough, shortness of breath, or wheezing Neurological: negative Dermatological: negative ENT: negative Skin: Negative. Gastrointestinal: no abdominal pain, change in bowel habits, or black or bloody stools Genito-Urinary: no dysuria, trouble voiding, or hematuria Hematological and Lymphatic: negative Musculoskeletal: negative Remaining ROS negative.  Physical Exam: Blood pressure (!) 139/59, pulse 74, temperature 98.6 F (37 C), temperature source Oral, resp. rate 18, height '5\' 10"'  (1.778 m), weight 184 lb 8 oz (83.7 kg), SpO2 96 %. ECOG: 1 General appearance: alert, cooperative and no distress Head: Normocephalic, without obvious abnormality, atraumatic Neck: no adenopathy, no carotid bruit, no JVD, supple, symmetrical, trachea midline and thyroid not enlarged, symmetric, no tenderness/mass/nodules Lymph nodes: Cervical, supraclavicular, and axillary nodes normal. Heart:regular rate and rhythm, S1, S2 normal, no murmur, click, rub or gallop Lung:chest clear, no wheezing, rales, normal symmetric air entry Abdomen: soft, non-tender, without masses or organomegaly EXT: Trace bilateral lower extremity edema.   Lab Results: Lab Results  Component Value Date   WBC 7.9 11/14/2016   HGB 11.0 (L) 11/14/2016   HCT 34.7 (L) 11/14/2016   MCV 83.5 11/14/2016   PLT 486 (H) 11/14/2016     Chemistry      Component Value Date/Time   NA 141 11/14/2016 0805   K 3.9 11/14/2016 0805   CL 102 03/05/2016 1459   CO2 26 11/14/2016 0805   BUN 11.4 11/14/2016 0805   CREATININE 0.8 11/14/2016 0805   GLU 155 08/24/2015      Component Value Date/Time   CALCIUM 9.0 11/14/2016 0805   ALKPHOS 67 11/14/2016 0805   AST 12 11/14/2016 0805   ALT 11 11/14/2016 0805   BILITOT 0.22  11/14/2016 0805       Radiological Studies: No results found.   Impression and Plan:   71 year old gentleman with the following issues:  1. 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 This was noted after presenting with symptoms of nausea, diarrhea and vomiting. He reports poor by mouth intake and weight loss. He does have history of prostate cancer Gleason score 5+4 = 9 resected in 2013.  He is status post biopsy obtained on 03/14/2016 and the final pathology showed metastatic carcinoma that is poorly differentiated that favors prostate cancer. Although the specimen did not stain for PSA his tumor is poorly differentiated likely secretes very little to no PSA. His last PSA was 1.3.  He is S/P Taxotere salvage chemotherapy started on 03/30/2016. He received 9 cycles of therapy with CT scan on 09/27/2016 showed progression of disease.   He underwent a biopsy of his peritoneal implants on 10/12/2016. The final pathology revealed a poorly differentiated tumor with neuroendocrine features.  He is currently receiving carboplatin and etoposide and tolerated the first cycle well. The plan is to proceed with cycle 2 day 3 today without any dose reduction or delay. Injection of Neulasta will be after each cycle.   2. Abdominal pain: Resolved at  this time. He is not taking any pain medication.  3. Antiemetics: Controlled at this time without complications. Antiemetics available to him for the new regimen.  4. IV access: Port-A-Cath placed and reports no recent complications.  5. Neutropenia prophylaxis: Neulasta will be needed for growth factor support.  6. Lower extremity edema: This is related to Taxotere chemotherapy. His edema continues to improve.  7. Pruritus: Prescription for Atarax was given to patient. These symptoms have resolved.  8. Anemia: Related to malignancy and chemotherapy. He is anemic and symptomatic. We will set up at 2 units of packed red  cells to be transfused in the immediate future.  9. Follow-up: Will be next week with chemotherapy.  Spent more than half the time coordinating care.    Mikey Bussing, DNP, AGPCNP-BC, AOCNP 7/11/20184:47 PM

## 2016-11-17 ENCOUNTER — Ambulatory Visit: Payer: PPO

## 2016-11-17 ENCOUNTER — Encounter (HOSPITAL_BASED_OUTPATIENT_CLINIC_OR_DEPARTMENT_OTHER): Payer: PPO

## 2016-11-17 DIAGNOSIS — Z5189 Encounter for other specified aftercare: Secondary | ICD-10-CM | POA: Diagnosis not present

## 2016-11-17 DIAGNOSIS — C61 Malignant neoplasm of prostate: Secondary | ICD-10-CM

## 2016-11-17 MED ORDER — PEGFILGRASTIM INJECTION 6 MG/0.6ML ~~LOC~~
6.0000 mg | PREFILLED_SYRINGE | Freq: Once | SUBCUTANEOUS | Status: AC
  Administered 2016-11-17: 6 mg via SUBCUTANEOUS
  Filled 2016-11-17: qty 0.6

## 2016-11-17 NOTE — Patient Instructions (Signed)
Pegfilgrastim injection What is this medicine? PEGFILGRASTIM (PEG fil gra stim) is a long-acting granulocyte colony-stimulating factor that stimulates the growth of neutrophils, a type of white blood cell important in the body's fight against infection. It is used to reduce the incidence of fever and infection in patients with certain types of cancer who are receiving chemotherapy that affects the bone marrow, and to increase survival after being exposed to high doses of radiation. This medicine may be used for other purposes; ask your health care provider or pharmacist if you have questions. COMMON BRAND NAME(S): Neulasta What should I tell my health care provider before I take this medicine? They need to know if you have any of these conditions: -kidney disease -latex allergy -ongoing radiation therapy -sickle cell disease -skin reactions to acrylic adhesives (On-Body Injector only) -an unusual or allergic reaction to pegfilgrastim, filgrastim, other medicines, foods, dyes, or preservatives -pregnant or trying to get pregnant -breast-feeding How should I use this medicine? This medicine is for injection under the skin. If you get this medicine at home, you will be taught how to prepare and give the pre-filled syringe or how to use the On-body Injector. Refer to the patient Instructions for Use for detailed instructions. Use exactly as directed. Take your medicine at regular intervals. Do not take your medicine more often than directed. It is important that you put your used needles and syringes in a special sharps container. Do not put them in a trash can. If you do not have a sharps container, call your pharmacist or healthcare provider to get one. Talk to your pediatrician regarding the use of this medicine in children. While this drug may be prescribed for selected conditions, precautions do apply. Overdosage: If you think you have taken too much of this medicine contact a poison control  center or emergency room at once. NOTE: This medicine is only for you. Do not share this medicine with others. What if I miss a dose? It is important not to miss your dose. Call your doctor or health care professional if you miss your dose. If you miss a dose due to an On-body Injector failure or leakage, a new dose should be administered as soon as possible using a single prefilled syringe for manual use. What may interact with this medicine? Interactions have not been studied. Give your health care provider a list of all the medicines, herbs, non-prescription drugs, or dietary supplements you use. Also tell them if you smoke, drink alcohol, or use illegal drugs. Some items may interact with your medicine. This list may not describe all possible interactions. Give your health care provider a list of all the medicines, herbs, non-prescription drugs, or dietary supplements you use. Also tell them if you smoke, drink alcohol, or use illegal drugs. Some items may interact with your medicine. What should I watch for while using this medicine? You may need blood work done while you are taking this medicine. If you are going to need a MRI, CT scan, or other procedure, tell your doctor that you are using this medicine (On-Body Injector only). What side effects may I notice from receiving this medicine? Side effects that you should report to your doctor or health care professional as soon as possible: -allergic reactions like skin rash, itching or hives, swelling of the face, lips, or tongue -dizziness -fever -pain, redness, or irritation at site where injected -pinpoint red spots on the skin -red or dark-brown urine -shortness of breath or breathing problems -stomach or   side pain, or pain at the shoulder -swelling -tiredness -trouble passing urine or change in the amount of urine Side effects that usually do not require medical attention (report to your doctor or health care professional if they  continue or are bothersome): -bone pain -muscle pain This list may not describe all possible side effects. Call your doctor for medical advice about side effects. You may report side effects to FDA at 1-800-FDA-1088. Where should I keep my medicine? Keep out of the reach of children. Store pre-filled syringes in a refrigerator between 2 and 8 degrees C (36 and 46 degrees F). Do not freeze. Keep in carton to protect from light. Throw away this medicine if it is left out of the refrigerator for more than 48 hours. Throw away any unused medicine after the expiration date. NOTE: This sheet is a summary. It may not cover all possible information. If you have questions about this medicine, talk to your doctor, pharmacist, or health care provider.  2017 Elsevier/Gold Standard (2014-05-15 14:30:14)  

## 2016-11-19 ENCOUNTER — Other Ambulatory Visit: Payer: Self-pay | Admitting: Oncology

## 2016-11-27 ENCOUNTER — Other Ambulatory Visit: Payer: Self-pay | Admitting: Oncology

## 2016-12-05 ENCOUNTER — Other Ambulatory Visit (HOSPITAL_BASED_OUTPATIENT_CLINIC_OR_DEPARTMENT_OTHER): Payer: PPO

## 2016-12-05 ENCOUNTER — Telehealth: Payer: Self-pay | Admitting: Oncology

## 2016-12-05 ENCOUNTER — Ambulatory Visit (HOSPITAL_BASED_OUTPATIENT_CLINIC_OR_DEPARTMENT_OTHER): Payer: PPO

## 2016-12-05 ENCOUNTER — Ambulatory Visit (HOSPITAL_BASED_OUTPATIENT_CLINIC_OR_DEPARTMENT_OTHER): Payer: PPO | Admitting: Oncology

## 2016-12-05 ENCOUNTER — Other Ambulatory Visit: Payer: PPO

## 2016-12-05 ENCOUNTER — Ambulatory Visit: Payer: PPO

## 2016-12-05 VITALS — BP 120/54 | HR 66 | Temp 98.2°F | Resp 18 | Ht 70.0 in | Wt 178.9 lb

## 2016-12-05 DIAGNOSIS — C786 Secondary malignant neoplasm of retroperitoneum and peritoneum: Secondary | ICD-10-CM

## 2016-12-05 DIAGNOSIS — R600 Localized edema: Secondary | ICD-10-CM | POA: Diagnosis not present

## 2016-12-05 DIAGNOSIS — Z95828 Presence of other vascular implants and grafts: Secondary | ICD-10-CM

## 2016-12-05 DIAGNOSIS — Z5111 Encounter for antineoplastic chemotherapy: Secondary | ICD-10-CM

## 2016-12-05 DIAGNOSIS — D630 Anemia in neoplastic disease: Secondary | ICD-10-CM | POA: Diagnosis not present

## 2016-12-05 DIAGNOSIS — D6481 Anemia due to antineoplastic chemotherapy: Secondary | ICD-10-CM | POA: Diagnosis not present

## 2016-12-05 DIAGNOSIS — C61 Malignant neoplasm of prostate: Secondary | ICD-10-CM | POA: Diagnosis not present

## 2016-12-05 LAB — COMPREHENSIVE METABOLIC PANEL
ALBUMIN: 3.4 g/dL — AB (ref 3.5–5.0)
ALK PHOS: 61 U/L (ref 40–150)
ALT: 14 U/L (ref 0–55)
ANION GAP: 10 meq/L (ref 3–11)
AST: 14 U/L (ref 5–34)
BILIRUBIN TOTAL: 0.25 mg/dL (ref 0.20–1.20)
BUN: 8.9 mg/dL (ref 7.0–26.0)
CALCIUM: 9 mg/dL (ref 8.4–10.4)
CHLORIDE: 106 meq/L (ref 98–109)
CO2: 24 mEq/L (ref 22–29)
CREATININE: 0.9 mg/dL (ref 0.7–1.3)
EGFR: 87 mL/min/{1.73_m2} — ABNORMAL LOW (ref >90)
Glucose: 213 mg/dl — ABNORMAL HIGH (ref 70–140)
Potassium: 4.2 mEq/L (ref 3.5–5.1)
Sodium: 141 mEq/L (ref 136–145)
TOTAL PROTEIN: 6.2 g/dL — AB (ref 6.4–8.3)

## 2016-12-05 LAB — CBC WITH DIFFERENTIAL/PLATELET
BASO%: 0.3 % (ref 0.0–2.0)
Basophils Absolute: 0 10*3/uL (ref 0.0–0.1)
EOS ABS: 0.2 10*3/uL (ref 0.0–0.5)
EOS%: 2.4 % (ref 0.0–7.0)
HCT: 34.1 % — ABNORMAL LOW (ref 38.4–49.9)
HEMOGLOBIN: 11 g/dL — AB (ref 13.0–17.1)
LYMPH%: 7.7 % — AB (ref 14.0–49.0)
MCH: 27.3 pg (ref 27.2–33.4)
MCHC: 32.2 g/dL (ref 32.0–36.0)
MCV: 84.7 fL (ref 79.3–98.0)
MONO#: 0.8 10*3/uL (ref 0.1–0.9)
MONO%: 9 % (ref 0.0–14.0)
NEUT%: 80.6 % — AB (ref 39.0–75.0)
NEUTROS ABS: 7.5 10*3/uL — AB (ref 1.5–6.5)
PLATELETS: 345 10*3/uL (ref 140–400)
RBC: 4.03 10*6/uL — ABNORMAL LOW (ref 4.20–5.82)
RDW: 22.3 % — ABNORMAL HIGH (ref 11.0–14.6)
WBC: 9.3 10*3/uL (ref 4.0–10.3)
lymph#: 0.7 10*3/uL — ABNORMAL LOW (ref 0.9–3.3)

## 2016-12-05 MED ORDER — SODIUM CHLORIDE 0.9% FLUSH
10.0000 mL | INTRAVENOUS | Status: DC | PRN
  Administered 2016-12-05: 10 mL via INTRAVENOUS
  Filled 2016-12-05: qty 10

## 2016-12-05 MED ORDER — SODIUM CHLORIDE 0.9 % IV SOLN
10.0000 mg | Freq: Once | INTRAVENOUS | Status: AC
  Administered 2016-12-05: 10 mg via INTRAVENOUS
  Filled 2016-12-05: qty 1

## 2016-12-05 MED ORDER — SODIUM CHLORIDE 0.9 % IV SOLN
Freq: Once | INTRAVENOUS | Status: AC
  Administered 2016-12-05: 10:00:00 via INTRAVENOUS

## 2016-12-05 MED ORDER — SODIUM CHLORIDE 0.9% FLUSH
10.0000 mL | INTRAVENOUS | Status: DC | PRN
  Administered 2016-12-05: 10 mL
  Filled 2016-12-05: qty 10

## 2016-12-05 MED ORDER — PALONOSETRON HCL INJECTION 0.25 MG/5ML
0.2500 mg | Freq: Once | INTRAVENOUS | Status: AC
  Administered 2016-12-05: 0.25 mg via INTRAVENOUS

## 2016-12-05 MED ORDER — HEPARIN SOD (PORK) LOCK FLUSH 100 UNIT/ML IV SOLN
500.0000 [IU] | Freq: Once | INTRAVENOUS | Status: AC | PRN
  Administered 2016-12-05: 500 [IU]
  Filled 2016-12-05: qty 5

## 2016-12-05 MED ORDER — SODIUM CHLORIDE 0.9 % IV SOLN
566.5000 mg | Freq: Once | INTRAVENOUS | Status: AC
  Administered 2016-12-05: 570 mg via INTRAVENOUS
  Filled 2016-12-05: qty 57

## 2016-12-05 MED ORDER — SODIUM CHLORIDE 0.9 % IV SOLN
95.0000 mg/m2 | Freq: Once | INTRAVENOUS | Status: AC
  Administered 2016-12-05: 200 mg via INTRAVENOUS
  Filled 2016-12-05: qty 10

## 2016-12-05 MED ORDER — PALONOSETRON HCL INJECTION 0.25 MG/5ML
INTRAVENOUS | Status: AC
  Filled 2016-12-05: qty 5

## 2016-12-05 NOTE — Progress Notes (Signed)
Hematology and Oncology Follow Up Visit  Frank Larsen [id number] 27-Nov-1945 71 y.o. 12/05/2016 8:44 AM [name], [name], MD   Principle Diagnosis: 71 year old gentleman with prostate cancer diagnosed in 2013 after presenting with a PSA of 3.6 and a Gleason score 5+4 = 9. He has peritoneal carcinomatosis biopsy proven to be adenocarcinoma likely of a prostate primary.   Prior Therapy: He is status post robotic-assisted laparoscopic radical prostatectomy and extended lymphadenectomy with the pathology reveals T2N0.  completed in June 2013. Taxotere chemotherapy at 75 mg/m every 3 weeks started on 03/30/2016. He is status post 9 cycles of therapy.  He developed relapsed disease and a biopsy proven to show neuroendocrine poorly differentiated tumor in June 2018.  Current therapy: Carboplatin and etoposide. Cycle 1 given on 10/24/2016. He is here for evaluation for cycle 3.  Interim History: [name] presents today for a follow-up visit. Since the last visit, he completed cycle 2 of chemotherapy without complications. He continues to report improvement in some of his symptoms. His itching has subsided at this time. He denied any infusion-related complications nausea or vomiting. He does report some fatigue but no dyspnea on exertion. His edema is resolving but not completely gone. He still takes Lasix periodically. His appetite has been excellent and performance status is improving. His energy is also close to normal and he is taking less naps during the day.  He does not report any headaches, blurry vision, syncope or seizures. He does not report any fevers, chills, sweats. He does not report any chest pain, palpitation, orthopnea or leg edema. He does not report any cough, wheezing or hemoptysis. He does not report any hematochezia, melena or diarrhea at this time. He has not moved his bowels last few days. He does not report any frequency, urgency or hematuria. He does not report  any skeletal complaints. Remaining review of systems and   Medications: I have reviewed the patient's current medications.  Current Outpatient Prescriptions  Medication Sig Dispense Refill  . atorvastatin (LIPITOR) 10 MG tablet Take 10 mg by mouth daily with breakfast.     . Blood Glucose Monitoring Suppl (ONE TOUCH ULTRA 2) w/Device KIT Use to check blood sugar 2 times per day. 1 each 2  . furosemide (LASIX) 20 MG tablet TAKE 1 TABLET BY MOUTH EVERY DAY 30 tablet 1  . glipiZIDE (GLUCOTROL XL) 5 MG 24 hr tablet Take 1 tablet (5 mg total) by mouth daily with breakfast. 90 tablet 2  . glucose blood (ONE TOUCH ULTRA TEST) test strip Use to check blood sugar 2 times per day. 200 each 5  . hydrOXYzine (ATARAX/VISTARIL) 10 MG tablet TAKE 1 TABLET (10 MG TOTAL) BY MOUTH EVERY 6 (SIX) HOURS AS NEEDED. 60 tablet 0  . lidocaine-prilocaine (EMLA) cream Apply 1 application topically as needed. Apply to port before chemotherapy. 30 g 0  . metFORMIN (GLUCOPHAGE) 1000 MG tablet Take 1 tablet (1,000 mg total) by mouth 2 (two) times daily with a meal. 180 tablet 3  . Multiple Vitamin (MULTIVITAMIN) tablet Take 1 tablet by mouth daily.    [name] omeprazole (PRILOSEC) 20 MG capsule Take 20 mg by mouth every morning.    [name] DELICA LANCETS 78G MISC Use to check blood sugar 2 times per day. 200 each 2  . prochlorperazine (COMPAZINE) 10 MG tablet Take 1 tablet (10 mg total) by mouth every 6 (six) hours as needed for nausea or vomiting. 30 tablet 0  . pseudoephedrine-acetaminophen (TYLENOL SINUS) 30-500 MG  TABS tablet Take 1 tablet by mouth every 4 (four) hours as needed (congestion).      No current facility-administered medications for this visit.      Allergies:  Allergies  Allergen Reactions  . Scallops [Shellfish Allergy] Nausea And Vomiting  . Cephalexin Hives and Rash    Past Medical History, Surgical history, Social history, and Family History were reviewed and updated.   Physical Exam: Blood  pressure (!) 120/54, pulse 66, temperature 98.2 F (36.8 C), temperature source Oral, resp. rate 18, height '5\' 10"'  (1.778 m), weight 178 lb 14.4 oz (81.1 kg), SpO2 97 %. ECOG: 1 General appearance: Alert, awake gentleman without distress. Head: Normocephalic, without obvious abnormality no oral thrush or ulcers. Neck: no adenopathy or masses. Lymph nodes: Cervical, supraclavicular, and axillary nodes normal. Heart:regular rate and rhythm, S1, S2 normal, no murmur, click, rub or gallop Lung:chest clear, no wheezing, rales, normal symmetric air entry Abdomin: soft, without masses or organomegaly. No rebound or guarding. EXT: No upper extremity edema noted. Lower extremity edema 1+ at the ankle more left than right.  Lab Results: Lab Results  Component Value Date   WBC 9.3 12/05/2016   HGB 11.0 (L) 12/05/2016   HCT 34.1 (L) 12/05/2016   MCV 84.7 12/05/2016   PLT 345 12/05/2016     Chemistry      Component Value Date/Time   NA 141 12/05/2016 0743   K 4.2 12/05/2016 0743   CL 102 03/05/2016 1459   CO2 24 12/05/2016 0743   BUN 8.9 12/05/2016 0743   CREATININE 0.9 12/05/2016 0743   GLU 155 08/24/2015      Component Value Date/Time   CALCIUM 9.0 12/05/2016 0743   ALKPHOS 61 12/05/2016 0743   AST 14 12/05/2016 0743   ALT 14 12/05/2016 0743   BILITOT 0.25 12/05/2016 0743         Impression and Plan:   71 year old gentleman with the following issues:  1. 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 This was noted after presenting with symptoms of nausea, diarrhea and vomiting. He reports poor by mouth intake and weight loss. He does have history of prostate cancer Gleason score 5+4 = 9 resected in 2013.  He is status post biopsy obtained on 03/14/2016 and the final pathology showed metastatic carcinoma that is poorly differentiated that favors prostate cancer. Although the specimen did not stain for PSA his tumor is poorly differentiated likely secretes very  little to no PSA. His last PSA was 1.3.  He is S/P Taxotere salvage chemotherapy started on 03/30/2016. He received 9 cycles of therapy with CT scan on 09/27/2016 showed progression of disease.   He underwent a biopsy of his peritoneal implants on 10/12/2016. The final pathology revealed a poorly differentiated tumor with neuroendocrine features.  He is currently receiving carboplatin and etoposide and continues to tolerated very well. He noticed a significant improvement in his clinical status. The plan is to proceed with cycle 3 without any dose reduction or delay. He will have a staging CT scan before cycle 4 to document improvement. The plan is to complete 6 cycles of therapy if he can tolerate it. Cycle 4 scheduled for the last week of August.  2. Abdominal pain: Resolved at this time. He is not taking any pain medication.  3. Antiemetics: Controlled at this time without complications. Nausea has not been an issue at this time.  4. IV access: Port-A-Cath placed and reports no recent complications.  5. Neutropenia prophylaxis: Neulasta  will be needed for growth factor support. No complications related to this medication.  6. Lower extremity edema: This is related to Taxotere chemotherapy. His edema has no new resolved.  7. Pruritus: Prescription for Atarax was given to patient. Completely resolved at this time.  8. Anemia: Related to malignancy and chemotherapy. His hemoglobin is 11 his last transfusion was over 4 weeks ago.  9. Follow-up: Will be in 3 weeks for cycle 4 of chemotherapy.   [facility], MD 7/30/20188:44 AM

## 2016-12-05 NOTE — Patient Instructions (Signed)

## 2016-12-05 NOTE — Telephone Encounter (Signed)
Gave patient avs report and appointments for August and September. Central radiology will call re scan,

## 2016-12-06 ENCOUNTER — Ambulatory Visit (HOSPITAL_BASED_OUTPATIENT_CLINIC_OR_DEPARTMENT_OTHER): Payer: PPO

## 2016-12-06 VITALS — BP 141/74 | HR 79 | Temp 98.4°F | Resp 17

## 2016-12-06 DIAGNOSIS — C61 Malignant neoplasm of prostate: Secondary | ICD-10-CM | POA: Diagnosis not present

## 2016-12-06 DIAGNOSIS — Z5111 Encounter for antineoplastic chemotherapy: Secondary | ICD-10-CM

## 2016-12-06 MED ORDER — SODIUM CHLORIDE 0.9 % IV SOLN
95.0000 mg/m2 | Freq: Once | INTRAVENOUS | Status: AC
  Administered 2016-12-06: 200 mg via INTRAVENOUS
  Filled 2016-12-06: qty 10

## 2016-12-06 MED ORDER — SODIUM CHLORIDE 0.9% FLUSH
10.0000 mL | INTRAVENOUS | Status: DC | PRN
  Administered 2016-12-06: 10 mL
  Filled 2016-12-06: qty 10

## 2016-12-06 MED ORDER — SODIUM CHLORIDE 0.9 % IV SOLN
Freq: Once | INTRAVENOUS | Status: AC
  Administered 2016-12-06: 08:00:00 via INTRAVENOUS

## 2016-12-06 MED ORDER — DEXAMETHASONE SODIUM PHOSPHATE 10 MG/ML IJ SOLN
INTRAMUSCULAR | Status: AC
  Filled 2016-12-06: qty 1

## 2016-12-06 MED ORDER — HEPARIN SOD (PORK) LOCK FLUSH 100 UNIT/ML IV SOLN
500.0000 [IU] | Freq: Once | INTRAVENOUS | Status: AC | PRN
  Administered 2016-12-06: 500 [IU]
  Filled 2016-12-06: qty 5

## 2016-12-06 MED ORDER — SODIUM CHLORIDE 0.9 % IV SOLN
10.0000 mg | Freq: Once | INTRAVENOUS | Status: AC
  Administered 2016-12-06: 10 mg via INTRAVENOUS
  Filled 2016-12-06: qty 1

## 2016-12-06 NOTE — Patient Instructions (Signed)
[facility] Discharge Instructions for Patients Receiving Chemotherapy  Today you received the following chemotherapy agents: Etoposide   To help prevent nausea and vomiting after your treatment, we encourage you to take your nausea medication as directed.    If you develop nausea and vomiting that is not controlled by your nausea medication, call the clinic.   BELOW ARE SYMPTOMS THAT SHOULD BE REPORTED IMMEDIATELY:  *FEVER GREATER THAN 100.5 F  *CHILLS WITH OR WITHOUT FEVER  NAUSEA AND VOMITING THAT IS NOT CONTROLLED WITH YOUR NAUSEA MEDICATION  *UNUSUAL SHORTNESS OF BREATH  *UNUSUAL BRUISING OR BLEEDING  TENDERNESS IN MOUTH AND THROAT WITH OR WITHOUT PRESENCE OF ULCERS  *URINARY PROBLEMS  *BOWEL PROBLEMS  UNUSUAL RASH Items with * indicate a potential emergency and should be followed up as soon as possible.  Feel free to call the clinic you have any questions or concerns. The clinic phone number is [phone].  Please show the CHEMO ALERT CARD at check-in to the Emergency Department and triage nurse.   

## 2016-12-07 ENCOUNTER — Ambulatory Visit: Payer: PPO

## 2016-12-07 ENCOUNTER — Ambulatory Visit (HOSPITAL_BASED_OUTPATIENT_CLINIC_OR_DEPARTMENT_OTHER): Payer: PPO

## 2016-12-07 VITALS — BP 138/64 | HR 63 | Temp 97.8°F | Resp 16

## 2016-12-07 DIAGNOSIS — Z5111 Encounter for antineoplastic chemotherapy: Secondary | ICD-10-CM | POA: Diagnosis not present

## 2016-12-07 DIAGNOSIS — C61 Malignant neoplasm of prostate: Secondary | ICD-10-CM | POA: Diagnosis not present

## 2016-12-07 MED ORDER — SODIUM CHLORIDE 0.9 % IV SOLN
95.0000 mg/m2 | Freq: Once | INTRAVENOUS | Status: AC
  Administered 2016-12-07: 200 mg via INTRAVENOUS
  Filled 2016-12-07: qty 10

## 2016-12-07 MED ORDER — HEPARIN SOD (PORK) LOCK FLUSH 100 UNIT/ML IV SOLN
500.0000 [IU] | Freq: Once | INTRAVENOUS | Status: AC | PRN
Start: 2016-12-07 — End: 2016-12-07
  Administered 2016-12-07: 500 [IU]
  Filled 2016-12-07: qty 5

## 2016-12-07 MED ORDER — HOT PACK MISC ONCOLOGY
1.0000 | Freq: Once | Status: AC | PRN
  Administered 2016-12-07: 1 via TOPICAL
  Filled 2016-12-07: qty 1

## 2016-12-07 MED ORDER — SODIUM CHLORIDE 0.9 % IV SOLN
Freq: Once | INTRAVENOUS | Status: AC
  Administered 2016-12-07: 08:00:00 via INTRAVENOUS

## 2016-12-07 MED ORDER — SODIUM CHLORIDE 0.9% FLUSH
10.0000 mL | INTRAVENOUS | Status: DC | PRN
  Administered 2016-12-07: 10 mL
  Filled 2016-12-07: qty 10

## 2016-12-07 MED ORDER — SODIUM CHLORIDE 0.9 % IV SOLN
10.0000 mg | Freq: Once | INTRAVENOUS | Status: AC
  Administered 2016-12-07: 10 mg via INTRAVENOUS
  Filled 2016-12-07: qty 1

## 2016-12-07 NOTE — Progress Notes (Signed)
During infusion, patient needed to go to restroom. While attempting to get out of treatment chair, IV pump began alarming due to occlusion. Patient states he may have pulled on the IV tubing while attempting to get out of the tx chair. Visual assessment of PAC site within normal limits. Attempted to flush with NS. Resistance noted. No blood return. Deaccessed PAC. Clear fluid oozing from site. Reaccessed without difficulty and treatment resumed and completed without further incident. Patient instructed to use warm compresses to site 4 times daily for 15-20 minutes each. Advised to call clinic if site becomes painful or reddened or if other symptoms arise. Patient also informed we would need to evaluate PAC site in 24 hours, 48 hours, and 1 week from time of incident. Verbalized understanding.

## 2016-12-07 NOTE — Patient Instructions (Signed)
[name] Discharge Instructions for Patients Receiving Chemotherapy  Today you received the following chemotherapy agents:  VP16  To help prevent nausea and vomiting after your treatment, we encourage you to take your nausea medication as directed.   If you develop nausea and vomiting that is not controlled by your nausea medication, call the clinic.   BELOW ARE SYMPTOMS THAT SHOULD BE REPORTED IMMEDIATELY:  *FEVER GREATER THAN 100.5 F  *CHILLS WITH OR WITHOUT FEVER  NAUSEA AND VOMITING THAT IS NOT CONTROLLED WITH YOUR NAUSEA MEDICATION  *UNUSUAL SHORTNESS OF BREATH  *UNUSUAL BRUISING OR BLEEDING  TENDERNESS IN MOUTH AND THROAT WITH OR WITHOUT PRESENCE OF ULCERS  *URINARY PROBLEMS  *BOWEL PROBLEMS  UNUSUAL RASH Items with * indicate a potential emergency and should be followed up as soon as possible.  Feel free to call the clinic you have any questions or concerns. The clinic phone number is [phone].  Please show the CHEMO ALERT CARD at check-in to the Emergency Department and triage nurse.   

## 2016-12-08 ENCOUNTER — Ambulatory Visit (HOSPITAL_BASED_OUTPATIENT_CLINIC_OR_DEPARTMENT_OTHER): Payer: PPO

## 2016-12-08 VITALS — BP 135/62 | HR 74 | Temp 97.9°F | Resp 18

## 2016-12-08 DIAGNOSIS — C61 Malignant neoplasm of prostate: Secondary | ICD-10-CM | POA: Diagnosis not present

## 2016-12-08 DIAGNOSIS — Z5189 Encounter for other specified aftercare: Secondary | ICD-10-CM | POA: Diagnosis not present

## 2016-12-08 MED ORDER — PEGFILGRASTIM INJECTION 6 MG/0.6ML ~~LOC~~
6.0000 mg | PREFILLED_SYRINGE | Freq: Once | SUBCUTANEOUS | Status: AC
  Administered 2016-12-08: 6 mg via SUBCUTANEOUS
  Filled 2016-12-08: qty 0.6

## 2016-12-08 NOTE — Progress Notes (Signed)
Port-A-Cath infilatration site is intact. Skin is slightly pink, no swelling noted, denies pain at site. Instructed to keep applying warm packs to site as needed. So secretions noted at site.

## 2016-12-08 NOTE — Patient Instructions (Signed)
Pegfilgrastim injection What is this medicine? PEGFILGRASTIM (PEG fil gra stim) is a long-acting granulocyte colony-stimulating factor that stimulates the growth of neutrophils, a type of white blood cell important in the body's fight against infection. It is used to reduce the incidence of fever and infection in patients with certain types of cancer who are receiving chemotherapy that affects the bone marrow, and to increase survival after being exposed to high doses of radiation. This medicine may be used for other purposes; ask your health care provider or pharmacist if you have questions. COMMON BRAND NAME(S): Neulasta What should I tell my health care provider before I take this medicine? They need to know if you have any of these conditions: -kidney disease -latex allergy -ongoing radiation therapy -sickle cell disease -skin reactions to acrylic adhesives (On-Body Injector only) -an unusual or allergic reaction to pegfilgrastim, filgrastim, other medicines, foods, dyes, or preservatives -pregnant or trying to get pregnant -breast-feeding How should I use this medicine? This medicine is for injection under the skin. If you get this medicine at home, you will be taught how to prepare and give the pre-filled syringe or how to use the On-body Injector. Refer to the patient Instructions for Use for detailed instructions. Use exactly as directed. Take your medicine at regular intervals. Do not take your medicine more often than directed. It is important that you put your used needles and syringes in a special sharps container. Do not put them in a trash can. If you do not have a sharps container, call your pharmacist or healthcare provider to get one. Talk to your pediatrician regarding the use of this medicine in children. While this drug may be prescribed for selected conditions, precautions do apply. Overdosage: If you think you have taken too much of this medicine contact a poison control  center or emergency room at once. NOTE: This medicine is only for you. Do not share this medicine with others. What if I miss a dose? It is important not to miss your dose. Call your doctor or health care professional if you miss your dose. If you miss a dose due to an On-body Injector failure or leakage, a new dose should be administered as soon as possible using a single prefilled syringe for manual use. What may interact with this medicine? Interactions have not been studied. Give your health care provider a list of all the medicines, herbs, non-prescription drugs, or dietary supplements you use. Also tell them if you smoke, drink alcohol, or use illegal drugs. Some items may interact with your medicine. This list may not describe all possible interactions. Give your health care provider a list of all the medicines, herbs, non-prescription drugs, or dietary supplements you use. Also tell them if you smoke, drink alcohol, or use illegal drugs. Some items may interact with your medicine. What should I watch for while using this medicine? You may need blood work done while you are taking this medicine. If you are going to need a MRI, CT scan, or other procedure, tell your doctor that you are using this medicine (On-Body Injector only). What side effects may I notice from receiving this medicine? Side effects that you should report to your doctor or health care professional as soon as possible: -allergic reactions like skin rash, itching or hives, swelling of the face, lips, or tongue -dizziness -fever -pain, redness, or irritation at site where injected -pinpoint red spots on the skin -red or dark-brown urine -shortness of breath or breathing problems -stomach or   side pain, or pain at the shoulder -swelling -tiredness -trouble passing urine or change in the amount of urine Side effects that usually do not require medical attention (report to your doctor or health care professional if they  continue or are bothersome): -bone pain -muscle pain This list may not describe all possible side effects. Call your doctor for medical advice about side effects. You may report side effects to FDA at 1-800-FDA-1088. Where should I keep my medicine? Keep out of the reach of children. Store pre-filled syringes in a refrigerator between 2 and 8 degrees C (36 and 46 degrees F). Do not freeze. Keep in carton to protect from light. Throw away this medicine if it is left out of the refrigerator for more than 48 hours. Throw away any unused medicine after the expiration date. NOTE: This sheet is a summary. It may not cover all possible information. If you have questions about this medicine, talk to your doctor, pharmacist, or health care provider.  2017 Elsevier/Gold Standard (2014-05-15 14:30:14)  

## 2016-12-09 ENCOUNTER — Ambulatory Visit: Payer: PPO

## 2016-12-09 NOTE — Progress Notes (Signed)
Pt extravasation 48 hr assessment @ 0940 12/09/2016: Documented also on extravasation sheet.   No redness, no edema, per pt no pain, skin intact. Pt to return in 1 week, and continue heat pack. Pt verbalized understanding. Pt stable. Educated to call if any changes, such as pain, swelling or redness.

## 2016-12-14 ENCOUNTER — Ambulatory Visit: Payer: PPO

## 2016-12-14 DIAGNOSIS — Z85820 Personal history of malignant melanoma of skin: Secondary | ICD-10-CM | POA: Diagnosis not present

## 2016-12-14 DIAGNOSIS — D2271 Melanocytic nevi of right lower limb, including hip: Secondary | ICD-10-CM | POA: Diagnosis not present

## 2016-12-14 DIAGNOSIS — L299 Pruritus, unspecified: Secondary | ICD-10-CM | POA: Diagnosis not present

## 2016-12-14 DIAGNOSIS — D1800 Hemangioma unspecified site: Secondary | ICD-10-CM | POA: Diagnosis not present

## 2016-12-14 DIAGNOSIS — D225 Melanocytic nevi of trunk: Secondary | ICD-10-CM | POA: Diagnosis not present

## 2016-12-14 DIAGNOSIS — L814 Other melanin hyperpigmentation: Secondary | ICD-10-CM | POA: Diagnosis not present

## 2016-12-14 DIAGNOSIS — L821 Other seborrheic keratosis: Secondary | ICD-10-CM | POA: Diagnosis not present

## 2016-12-14 NOTE — Progress Notes (Signed)
Patient presented to treatment area for extravasation recheck. Site WNL. Denies discomfort. Advised patient to call [place] if problems arise. Verbalized understanding.

## 2016-12-15 DIAGNOSIS — C786 Secondary malignant neoplasm of retroperitoneum and peritoneum: Secondary | ICD-10-CM | POA: Diagnosis not present

## 2016-12-15 DIAGNOSIS — R609 Edema, unspecified: Secondary | ICD-10-CM | POA: Diagnosis not present

## 2016-12-15 DIAGNOSIS — I10 Essential (primary) hypertension: Secondary | ICD-10-CM | POA: Diagnosis not present

## 2016-12-15 DIAGNOSIS — E1165 Type 2 diabetes mellitus with hyperglycemia: Secondary | ICD-10-CM | POA: Diagnosis not present

## 2016-12-15 DIAGNOSIS — E782 Mixed hyperlipidemia: Secondary | ICD-10-CM | POA: Diagnosis not present

## 2016-12-15 DIAGNOSIS — C61 Malignant neoplasm of prostate: Secondary | ICD-10-CM | POA: Diagnosis not present

## 2016-12-15 DIAGNOSIS — K219 Gastro-esophageal reflux disease without esophagitis: Secondary | ICD-10-CM | POA: Diagnosis not present

## 2016-12-15 DIAGNOSIS — Z1389 Encounter for screening for other disorder: Secondary | ICD-10-CM | POA: Diagnosis not present

## 2016-12-17 ENCOUNTER — Other Ambulatory Visit: Payer: Self-pay | Admitting: Oncology

## 2016-12-18 ENCOUNTER — Encounter: Payer: Self-pay | Admitting: Oncology

## 2016-12-19 ENCOUNTER — Encounter: Payer: Self-pay | Admitting: *Deleted

## 2016-12-19 ENCOUNTER — Telehealth: Payer: Self-pay | Admitting: *Deleted

## 2016-12-19 NOTE — Telephone Encounter (Signed)
Patient calling to say his CT scan has not been scheduled and he was not given contrast to drink. Lm for brandi at [facility] managed care to help with this issue.

## 2016-12-21 ENCOUNTER — Encounter: Payer: Self-pay | Admitting: *Deleted

## 2016-12-21 NOTE — Progress Notes (Signed)
rec'd call from brandi in [place]. CT scan approved and sent to central scheduling. Scheduled for 12/23/16. Contrast left at front for patient p/u. patient notified.

## 2016-12-23 ENCOUNTER — Ambulatory Visit (HOSPITAL_COMMUNITY)
Admission: RE | Admit: 2016-12-23 | Discharge: 2016-12-23 | Disposition: A | Discharge status: Home / Self Care | Payer: PPO | Source: Ambulatory Visit | Attending: Oncology | Admitting: Oncology

## 2016-12-23 DIAGNOSIS — J90 Pleural effusion, not elsewhere classified: Secondary | ICD-10-CM | POA: Insufficient documentation

## 2016-12-23 DIAGNOSIS — R180 Malignant ascites: Secondary | ICD-10-CM | POA: Diagnosis not present

## 2016-12-23 DIAGNOSIS — I700 Atherosclerosis of aorta: Secondary | ICD-10-CM | POA: Insufficient documentation

## 2016-12-23 DIAGNOSIS — C61 Malignant neoplasm of prostate: Secondary | ICD-10-CM | POA: Diagnosis not present

## 2016-12-23 DIAGNOSIS — I2510 Atherosclerotic heart disease of native coronary artery without angina pectoris: Secondary | ICD-10-CM | POA: Diagnosis not present

## 2016-12-23 MED ORDER — HEPARIN SOD (PORK) LOCK FLUSH 100 UNIT/ML IV SOLN
INTRAVENOUS | Status: AC
  Filled 2016-12-23: qty 5

## 2016-12-23 MED ORDER — HEPARIN SOD (PORK) LOCK FLUSH 100 UNIT/ML IV SOLN
500.0000 [IU] | Freq: Once | INTRAVENOUS | Status: AC
  Administered 2016-12-23: 500 [IU] via INTRAVENOUS

## 2016-12-23 MED ORDER — IOPAMIDOL (ISOVUE-300) INJECTION 61%
INTRAVENOUS | Status: AC
  Filled 2016-12-23: qty 100

## 2016-12-23 MED ORDER — IOPAMIDOL (ISOVUE-300) INJECTION 61%
100.0000 mL | Freq: Once | INTRAVENOUS | Status: AC | PRN
  Administered 2016-12-23: 100 mL via INTRAVENOUS

## 2016-12-27 ENCOUNTER — Telehealth: Payer: Self-pay | Admitting: Oncology

## 2016-12-27 ENCOUNTER — Ambulatory Visit (HOSPITAL_BASED_OUTPATIENT_CLINIC_OR_DEPARTMENT_OTHER): Payer: PPO

## 2016-12-27 ENCOUNTER — Ambulatory Visit (HOSPITAL_BASED_OUTPATIENT_CLINIC_OR_DEPARTMENT_OTHER): Payer: PPO | Admitting: Oncology

## 2016-12-27 ENCOUNTER — Other Ambulatory Visit (HOSPITAL_BASED_OUTPATIENT_CLINIC_OR_DEPARTMENT_OTHER): Payer: PPO

## 2016-12-27 VITALS — BP 131/70 | HR 72 | Temp 97.9°F | Resp 18 | Ht 70.0 in | Wt 177.9 lb

## 2016-12-27 DIAGNOSIS — C61 Malignant neoplasm of prostate: Secondary | ICD-10-CM

## 2016-12-27 DIAGNOSIS — D630 Anemia in neoplastic disease: Secondary | ICD-10-CM | POA: Diagnosis not present

## 2016-12-27 DIAGNOSIS — C786 Secondary malignant neoplasm of retroperitoneum and peritoneum: Secondary | ICD-10-CM | POA: Diagnosis not present

## 2016-12-27 DIAGNOSIS — Z95828 Presence of other vascular implants and grafts: Secondary | ICD-10-CM

## 2016-12-27 DIAGNOSIS — D6481 Anemia due to antineoplastic chemotherapy: Secondary | ICD-10-CM

## 2016-12-27 LAB — CBC WITH DIFFERENTIAL/PLATELET
BASO%: 0.3 % (ref 0.0–2.0)
BASOS ABS: 0 10*3/uL (ref 0.0–0.1)
EOS ABS: 0.2 10*3/uL (ref 0.0–0.5)
EOS%: 2.8 % (ref 0.0–7.0)
HEMATOCRIT: 31.3 % — AB (ref 38.4–49.9)
HGB: 9.7 g/dL — ABNORMAL LOW (ref 13.0–17.1)
LYMPH%: 12.8 % — AB (ref 14.0–49.0)
MCH: 27.6 pg (ref 27.2–33.4)
MCHC: 31 g/dL — AB (ref 32.0–36.0)
MCV: 88.9 fL (ref 79.3–98.0)
MONO#: 0.7 10*3/uL (ref 0.1–0.9)
MONO%: 10.9 % (ref 0.0–14.0)
NEUT#: 4.4 10*3/uL (ref 1.5–6.5)
NEUT%: 73.2 % (ref 39.0–75.0)
PLATELETS: 324 10*3/uL (ref 140–400)
RBC: 3.52 10*6/uL — AB (ref 4.20–5.82)
RDW: 22.4 % — ABNORMAL HIGH (ref 11.0–14.6)
WBC: 6 10*3/uL (ref 4.0–10.3)
lymph#: 0.8 10*3/uL — ABNORMAL LOW (ref 0.9–3.3)

## 2016-12-27 LAB — COMPREHENSIVE METABOLIC PANEL
ALT: 15 U/L (ref 0–55)
ANION GAP: 9 meq/L (ref 3–11)
AST: 15 U/L (ref 5–34)
Albumin: 3.3 g/dL — ABNORMAL LOW (ref 3.5–5.0)
Alkaline Phosphatase: 52 U/L (ref 40–150)
BILIRUBIN TOTAL: 0.25 mg/dL (ref 0.20–1.20)
BUN: 13.8 mg/dL (ref 7.0–26.0)
CALCIUM: 8.6 mg/dL (ref 8.4–10.4)
CHLORIDE: 106 meq/L (ref 98–109)
CO2: 26 meq/L (ref 22–29)
Creatinine: 0.9 mg/dL (ref 0.7–1.3)
EGFR: 81 mL/min/{1.73_m2} — AB (ref >90)
Glucose: 186 mg/dl — ABNORMAL HIGH (ref 70–140)
Potassium: 4 mEq/L (ref 3.5–5.1)
Sodium: 141 mEq/L (ref 136–145)
Total Protein: 5.9 g/dL — ABNORMAL LOW (ref 6.4–8.3)

## 2016-12-27 MED ORDER — SODIUM CHLORIDE 0.9% FLUSH
10.0000 mL | INTRAVENOUS | Status: DC | PRN
  Administered 2016-12-27: 10 mL via INTRAVENOUS
  Filled 2016-12-27: qty 10

## 2016-12-27 MED ORDER — HEPARIN SOD (PORK) LOCK FLUSH 100 UNIT/ML IV SOLN
500.0000 [IU] | Freq: Once | INTRAVENOUS | Status: AC | PRN
  Administered 2016-12-27: 500 [IU] via INTRAVENOUS
  Filled 2016-12-27: qty 5

## 2016-12-27 NOTE — Progress Notes (Signed)
Hematology and Oncology Follow Up Visit  Frank Larsen [id number] 1945/12/01 71 y.o. 12/27/2016 9:46 AM [name], [name], MD   Principle Diagnosis: 71 year old gentleman with prostate cancer diagnosed in 2013 after presenting with a PSA of 3.6 and a Gleason score 5+4 = 9. He has peritoneal carcinomatosis biopsy proven to be adenocarcinoma likely of a prostate primary.   Prior Therapy: He is status post robotic-assisted laparoscopic radical prostatectomy and extended lymphadenectomy with the pathology reveals T2N0.  completed in June 2013. Taxotere chemotherapy at 75 mg/m every 3 weeks started on 03/30/2016. He is status post 9 cycles of therapy.  He developed relapsed disease and a biopsy proven to show neuroendocrine poorly differentiated tumor in June 2018.  Current therapy: Carboplatin and etoposide. Cycle 1 given on 10/24/2016. He is here for evaluation for cycle 4.  Interim History: Mr. Rondeau presents today for a follow-up visit. Since the last visit, he reports feeling reasonably well. He reports periodic constipation and abdominal discomfort at nighttime that requires Advil at times. He does take a stool softener intermittently with some relief of his constipation.  He completed cycle 3 of chemotherapy without complications. His itching has subsided at this time. He denied any infusion-related complications nausea or vomiting. He does report some fatigue but no dyspnea on exertion. His appetite has been excellent and performance status is improving. He was able to perform some work outside his house at this time. He is planning a trip to [place] this week.  He does not report any headaches, blurry vision, syncope or seizures. He does not report any fevers, chills, sweats. He does not report any chest pain, palpitation, orthopnea or leg edema. He does not report any cough, wheezing or hemoptysis. He does not report any hematochezia, melena or diarrhea at this time. He has not  moved his bowels last few days. He does not report any frequency, urgency or hematuria. He does not report any skeletal complaints. Remaining review of systems and   Medications: I have reviewed the patient's current medications.  Current Outpatient Prescriptions  Medication Sig Dispense Refill  . atorvastatin (LIPITOR) 10 MG tablet Take 10 mg by mouth daily with breakfast.     . Blood Glucose Monitoring Suppl (ONE TOUCH ULTRA 2) w/Device KIT Use to check blood sugar 2 times per day. 1 each 2  . furosemide (LASIX) 20 MG tablet TAKE 1 TABLET BY MOUTH EVERY DAY 30 tablet 1  . glipiZIDE (GLUCOTROL XL) 5 MG 24 hr tablet Take 1 tablet (5 mg total) by mouth daily with breakfast. 90 tablet 2  . glucose blood (ONE TOUCH ULTRA TEST) test strip Use to check blood sugar 2 times per day. 200 each 5  . hydrOXYzine (ATARAX/VISTARIL) 10 MG tablet TAKE 1 TABLET (10 MG TOTAL) BY MOUTH EVERY 6 (SIX) HOURS AS NEEDED. 60 tablet 0  . lidocaine-prilocaine (EMLA) cream Apply 1 application topically as needed. Apply to port before chemotherapy. 30 g 0  . metFORMIN (GLUCOPHAGE) 1000 MG tablet Take 1 tablet (1,000 mg total) by mouth 2 (two) times daily with a meal. 180 tablet 3  . Multiple Vitamin (MULTIVITAMIN) tablet Take 1 tablet by mouth daily.    [name] omeprazole (PRILOSEC) 20 MG capsule Take 20 mg by mouth every morning.    [name] DELICA LANCETS 53G MISC Use to check blood sugar 2 times per day. 200 each 2  . prochlorperazine (COMPAZINE) 10 MG tablet Take 1 tablet (10 mg total) by mouth every 6 (six) hours  as needed for nausea or vomiting. 30 tablet 0  . pseudoephedrine-acetaminophen (TYLENOL SINUS) 30-500 MG TABS tablet Take 1 tablet by mouth every 4 (four) hours as needed (congestion).      No current facility-administered medications for this visit.      Allergies:  Allergies  Allergen Reactions  . Scallops [Shellfish Allergy] Nausea And Vomiting  . Cephalexin Hives and Rash    Past Medical History,  Surgical history, Social history, and Family History were reviewed and updated.   Physical Exam: Blood pressure 131/70, pulse 72, temperature 97.9 F (36.6 C), temperature source Oral, resp. rate 18, height '5\' 10"'  (1.778 m), weight 177 lb 14.4 oz (80.7 kg), SpO2 96 %. ECOG: 1 General appearance: Well-appearing gentleman without distress. Head: Normocephalic, without obvious abnormality no oral ulcers or lesions. Neck: no adenopathy or masses. Lymph nodes: Cervical, supraclavicular, and axillary nodes normal. Heart:regular rate and rhythm, S1, S2 normal, no murmur, click, rub or gallop Lung:chest clear, no wheezing, rales, normal symmetric air entry Abdomin: soft, without masses or organomegaly. No rebound or guarding. EXT: Lower extremity edema 1+ at the ankle more left than right.  Lab Results: Lab Results  Component Value Date   WBC 6.0 12/27/2016   HGB 9.7 (L) 12/27/2016   HCT 31.3 (L) 12/27/2016   MCV 88.9 12/27/2016   PLT 324 12/27/2016     Chemistry      Component Value Date/Time   NA 141 12/05/2016 0743   K 4.2 12/05/2016 0743   CL 102 03/05/2016 1459   CO2 24 12/05/2016 0743   BUN 8.9 12/05/2016 0743   CREATININE 0.9 12/05/2016 0743   GLU 155 08/24/2015      Component Value Date/Time   CALCIUM 9.0 12/05/2016 0743   ALKPHOS 61 12/05/2016 0743   AST 14 12/05/2016 0743   ALT 14 12/05/2016 0743   BILITOT 0.25 12/05/2016 0743      EXAM: CT CHEST, ABDOMEN, AND PELVIS WITH CONTRAST  TECHNIQUE: Multidetector CT imaging of the chest, abdomen and pelvis was performed following the standard protocol during bolus administration of intravenous contrast.  CONTRAST:  100 mL of Isovue-300.  COMPARISON:  CT of the abdomen and pelvis 10/12/2016. Chest CT 09/27/2016.  FINDINGS: CT CHEST FINDINGS  Cardiovascular: Heart size is normal. There is no significant pericardial fluid, thickening or pericardial calcification. There is aortic atherosclerosis, as well as  atherosclerosis of the great vessels of the mediastinum and the coronary arteries, including calcified atherosclerotic plaque in the left anterior descending and right coronary arteries. Right internal jugular single-lumen porta cath with tip terminating in the distal superior vena cava.  Mediastinum/Nodes: No pathologically enlarged mediastinal or hilar lymph nodes. Esophagus is unremarkable in appearance. No axillary lymphadenopathy.  Lungs/Pleura: Moderate bilateral pleural effusions are again noted layering dependently. Some associated passive subsegmental atelectasis in the dependent portions of the lower lobes of the lungs bilaterally. No acute consolidative airspace disease. No suspicious appearing pulmonary nodules or masses.  Musculoskeletal: Expansile predominantly sclerotic lesion in the lateral aspect of the right eighth rib appears similar to the prior study. Subtle sclerotic lesion measuring 8 mm in the lateral aspect of the left fifth rib also appears similar to the prior study in retrospect but is new compared to more remote prior examinations, likely to represent an additional metastatic lesion. Soft tissue anchors in the right humeral head, presumably from prior rotator cuff surgery.  CT ABDOMEN PELVIS FINDINGS  Hepatobiliary: Subcentimeter low-attenuation lesion in segment 2 of the liver is too small  to definitively characterize, but is similar to prior studies, favored to represent a tiny cyst. No other larger more suspicious appearing hepatic lesions are noted. No intra or extrahepatic biliary ductal dilatation. Gallbladder is normal in appearance.  Pancreas: No pancreatic mass. No pancreatic ductal dilatation. No pancreatic or peripancreatic fluid or inflammatory changes. Tiny calcification in the tail of the pancreas is unchanged.  Spleen: Unremarkable.  Adrenals/Urinary Tract: Bilateral kidneys and bilateral adrenal glands are normal in  appearance. No hydroureteronephrosis. Urinary bladder is unremarkable in appearance.  Stomach/Bowel: Normal appearance of the stomach. No pathologic dilatation of small bowel or colon. The appendix is not confidently identified and may be surgically absent. Regardless, there are no inflammatory changes noted adjacent to the cecum to suggest the presence of an acute appendicitis at this time.  Vascular/Lymphatic: Aortic atherosclerosis, without evidence of aneurysm or dissection in the abdominal or pelvic vasculature. Multiple prominent but nonenlarged retroperitoneal lymph nodes appear similar to prior studies and remain nonspecific.  Reproductive: Status post prostatectomy.  Other: Small volume of ascites decreased compared to the prior examination. Some persistent areas of nodularity and enhancement are noted in the peritoneal cavity, albeit decreased in prominence compared to the prior study. This includes a 5 mm enhancing nodule adjacent to the anterior aspect of the spleen (axial image 62 of series 2), decreased from 7 mm on the prior examination, and a lesion in the anterior aspect of the pelvis on the right side (axial image 116 of series 2 and coronal image 66 of series 5) measuring 2.2 x 0.7 x 0.8 cm (previously up to 4.0 x 2.3 cm on axial images). The diffuse soft tissue stranding in the omentum seen on the prior study has also decreased in prominence. No definite new soft tissue implants are noted in the peritoneal cavity on today's examination. No pneumoperitoneum.  Musculoskeletal: Multiple tiny sclerotic lesions again noted throughout the visualized axial and appendicular skeleton, similar in size, number and distribution to the prior study. Many of these appear to represent tiny bone islands, however, there is at least one suspicious lesion measuring 9 mm in the right ilium (axial image 92 of series 2) which is similar to the prior study and likely to represent  a metastatic lesion.  IMPRESSION: 1. Today's study demonstrates a positive response to therapy with regression of intraperitoneal lesions, and decreasing volume of malignant ascites (now small). Previously noted bony lesions are unchanged compared to prior examinations. No new sites of metastatic disease are noted in the chest, abdomen or pelvis. 2. Moderate bilateral pleural effusions are stable in size. 3. Aortic atherosclerosis, in addition to two vessel coronary artery disease. Assessment for potential risk factor modification, dietary therapy or pharmacologic therapy may be warranted, if clinically indicated. 4. Additional incidental findings, as above. Aortic Atherosclerosis (ICD10-I70.0).    Impression and Plan:   71 year old gentleman with the following issues:  1. 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 This was noted after presenting with symptoms of nausea, diarrhea and vomiting. He reports poor by mouth intake and weight loss. He does have history of prostate cancer Gleason score 5+4 = 9 resected in 2013.  He is status post biopsy obtained on 03/14/2016 and the final pathology showed metastatic carcinoma that is poorly differentiated that favors prostate cancer. Although the specimen did not stain for PSA his tumor is poorly differentiated likely secretes very little to no PSA. His last PSA was 1.3.  He is S/P Taxotere salvage chemotherapy started on 03/30/2016. He  received 9 cycles of therapy with CT scan on 09/27/2016 showed progression of disease.   He underwent a biopsy of his peritoneal implants on 10/12/2016. The final pathology revealed a poorly differentiated tumor with neuroendocrine features.  He is currently receiving carboplatin and etoposide and continues to tolerated very well.   CT scan obtained on 12/23/2016 was personally reviewed today and showed positive response to therapy. His omental metastasis appears to be reduced with  decreased ascites. Risks and benefits of continuing chemotherapy was reviewed today and is agreeable to continue. He will receive cycle 4 starting on 01/03/2017 without any dose reduction or delay.   2. Abdominal pain: Very minimal at this time and takes Advil as needed.  3. Antiemetics: Controlled at this time without complications. Nausea has not been an issue at this time.  4. IV access: Port-A-Cath placed and reports no recent complications.  5. Neutropenia prophylaxis: Neulasta will be needed for growth factor support. He is tolerating this medication well at this time.  6. Lower extremity edema: This is related to Taxotere chemotherapy. His edema has no new resolved.  7. Pruritus: Prescription for Atarax was given to patient. Completely resolved at this time.  8. Anemia: Related to malignancy and chemotherapy. His hemoglobin is slightly down to 9.7 but no transfusion for the time being.  9. Follow-up: Will be in one week to receive cycle 4 of chemotherapy and in 4 weeks for cycle 5.  [facility], MD 8/21/20189:46 AM

## 2016-12-27 NOTE — Telephone Encounter (Signed)
No 8/21 los.  

## 2017-01-02 ENCOUNTER — Telehealth: Payer: Self-pay

## 2017-01-02 NOTE — Telephone Encounter (Signed)
lvm that labs last week were good and per Dr Hazeline Junker OV note we can go forward with C4 chemo. Asked pt to call in AM to inform us if he is feeling fine we can cancel the lab appt or if he is not feeling well we can keep the lab appt associated with C4.

## 2017-01-03 ENCOUNTER — Ambulatory Visit (HOSPITAL_BASED_OUTPATIENT_CLINIC_OR_DEPARTMENT_OTHER): Payer: PPO

## 2017-01-03 ENCOUNTER — Other Ambulatory Visit: Payer: PPO

## 2017-01-03 VITALS — BP 135/70 | HR 69 | Temp 98.1°F | Resp 18

## 2017-01-03 DIAGNOSIS — C61 Malignant neoplasm of prostate: Secondary | ICD-10-CM | POA: Diagnosis not present

## 2017-01-03 DIAGNOSIS — Z5111 Encounter for antineoplastic chemotherapy: Secondary | ICD-10-CM | POA: Diagnosis not present

## 2017-01-03 MED ORDER — SODIUM CHLORIDE 0.9 % IV SOLN
Freq: Once | INTRAVENOUS | Status: AC
  Administered 2017-01-03: 15:00:00 via INTRAVENOUS

## 2017-01-03 MED ORDER — SODIUM CHLORIDE 0.9% FLUSH
10.0000 mL | INTRAVENOUS | Status: DC | PRN
  Administered 2017-01-03: 10 mL
  Filled 2017-01-03: qty 10

## 2017-01-03 MED ORDER — SODIUM CHLORIDE 0.9 % IV SOLN
566.5000 mg | Freq: Once | INTRAVENOUS | Status: AC
  Administered 2017-01-03: 570 mg via INTRAVENOUS
  Filled 2017-01-03: qty 57

## 2017-01-03 MED ORDER — DEXAMETHASONE SODIUM PHOSPHATE 10 MG/ML IJ SOLN
INTRAMUSCULAR | Status: AC
  Filled 2017-01-03: qty 1

## 2017-01-03 MED ORDER — PALONOSETRON HCL INJECTION 0.25 MG/5ML
INTRAVENOUS | Status: AC
  Filled 2017-01-03: qty 5

## 2017-01-03 MED ORDER — PALONOSETRON HCL INJECTION 0.25 MG/5ML
0.2500 mg | Freq: Once | INTRAVENOUS | Status: AC
  Administered 2017-01-03: 0.25 mg via INTRAVENOUS

## 2017-01-03 MED ORDER — SODIUM CHLORIDE 0.9 % IV SOLN
95.0000 mg/m2 | Freq: Once | INTRAVENOUS | Status: AC
  Administered 2017-01-03: 200 mg via INTRAVENOUS
  Filled 2017-01-03: qty 10

## 2017-01-03 MED ORDER — SODIUM CHLORIDE 0.9 % IV SOLN
10.0000 mg | Freq: Once | INTRAVENOUS | Status: AC
  Administered 2017-01-03: 10 mg via INTRAVENOUS
  Filled 2017-01-03: qty 1

## 2017-01-03 MED ORDER — HEPARIN SOD (PORK) LOCK FLUSH 100 UNIT/ML IV SOLN
500.0000 [IU] | Freq: Once | INTRAVENOUS | Status: AC | PRN
  Administered 2017-01-03: 500 [IU]
  Filled 2017-01-03: qty 5

## 2017-01-03 NOTE — Telephone Encounter (Signed)
Pt feels well, s/w Melissa PharmD, cancelled labs appt today, kept chemo infusion appt.

## 2017-01-04 ENCOUNTER — Ambulatory Visit (HOSPITAL_BASED_OUTPATIENT_CLINIC_OR_DEPARTMENT_OTHER): Payer: PPO

## 2017-01-04 VITALS — BP 145/71 | HR 82 | Temp 97.8°F | Resp 16

## 2017-01-04 DIAGNOSIS — Z5111 Encounter for antineoplastic chemotherapy: Secondary | ICD-10-CM

## 2017-01-04 DIAGNOSIS — C61 Malignant neoplasm of prostate: Secondary | ICD-10-CM

## 2017-01-04 MED ORDER — SODIUM CHLORIDE 0.9% FLUSH
10.0000 mL | INTRAVENOUS | Status: DC | PRN
  Administered 2017-01-04: 10 mL
  Filled 2017-01-04: qty 10

## 2017-01-04 MED ORDER — SODIUM CHLORIDE 0.9 % IV SOLN
200.0000 mg | Freq: Once | INTRAVENOUS | Status: AC
  Administered 2017-01-04: 200 mg via INTRAVENOUS
  Filled 2017-01-04: qty 10

## 2017-01-04 MED ORDER — SODIUM CHLORIDE 0.9 % IV SOLN
10.0000 mg | Freq: Once | INTRAVENOUS | Status: AC
  Administered 2017-01-04: 10 mg via INTRAVENOUS
  Filled 2017-01-04: qty 1

## 2017-01-04 MED ORDER — SODIUM CHLORIDE 0.9 % IV SOLN
Freq: Once | INTRAVENOUS | Status: AC
  Administered 2017-01-04: 10:00:00 via INTRAVENOUS

## 2017-01-04 MED ORDER — HEPARIN SOD (PORK) LOCK FLUSH 100 UNIT/ML IV SOLN
500.0000 [IU] | Freq: Once | INTRAVENOUS | Status: AC | PRN
  Administered 2017-01-04: 500 [IU]
  Filled 2017-01-04: qty 5

## 2017-01-04 NOTE — Progress Notes (Signed)
Ok per pharmacy to treat patient using labs from 12/27/16.

## 2017-01-04 NOTE — Patient Instructions (Signed)
[facility] Discharge Instructions for Patients Receiving Chemotherapy  Today you received the following chemotherapy agents etoposide   To help prevent nausea and vomiting after your treatment, we encourage you to take your nausea medication as directed  If you develop nausea and vomiting that is not controlled by your nausea medication, call the clinic.   BELOW ARE SYMPTOMS THAT SHOULD BE REPORTED IMMEDIATELY:  *FEVER GREATER THAN 100.5 F  *CHILLS WITH OR WITHOUT FEVER  NAUSEA AND VOMITING THAT IS NOT CONTROLLED WITH YOUR NAUSEA MEDICATION  *UNUSUAL SHORTNESS OF BREATH  *UNUSUAL BRUISING OR BLEEDING  TENDERNESS IN MOUTH AND THROAT WITH OR WITHOUT PRESENCE OF ULCERS  *URINARY PROBLEMS  *BOWEL PROBLEMS  UNUSUAL RASH Items with * indicate a potential emergency and should be followed up as soon as possible.  Feel free to call the clinic you have any questions or concerns. The clinic phone number is [phone].  

## 2017-01-05 ENCOUNTER — Other Ambulatory Visit: Payer: Self-pay | Admitting: Oncology

## 2017-01-05 ENCOUNTER — Ambulatory Visit (HOSPITAL_BASED_OUTPATIENT_CLINIC_OR_DEPARTMENT_OTHER): Payer: PPO

## 2017-01-05 VITALS — BP 131/67 | HR 63 | Temp 98.2°F | Resp 17

## 2017-01-05 DIAGNOSIS — C61 Malignant neoplasm of prostate: Secondary | ICD-10-CM

## 2017-01-05 DIAGNOSIS — Z5111 Encounter for antineoplastic chemotherapy: Secondary | ICD-10-CM | POA: Diagnosis not present

## 2017-01-05 MED ORDER — SODIUM CHLORIDE 0.9% FLUSH
10.0000 mL | INTRAVENOUS | Status: DC | PRN
  Administered 2017-01-05: 10 mL
  Filled 2017-01-05: qty 10

## 2017-01-05 MED ORDER — SODIUM CHLORIDE 0.9 % IV SOLN
10.0000 mg | Freq: Once | INTRAVENOUS | Status: AC
  Administered 2017-01-05: 10 mg via INTRAVENOUS
  Filled 2017-01-05: qty 1

## 2017-01-05 MED ORDER — SODIUM CHLORIDE 0.9 % IV SOLN
Freq: Once | INTRAVENOUS | Status: AC
  Administered 2017-01-05: 10:00:00 via INTRAVENOUS

## 2017-01-05 MED ORDER — HEPARIN SOD (PORK) LOCK FLUSH 100 UNIT/ML IV SOLN
500.0000 [IU] | Freq: Once | INTRAVENOUS | Status: AC | PRN
  Administered 2017-01-05: 500 [IU]
  Filled 2017-01-05: qty 5

## 2017-01-05 MED ORDER — SODIUM CHLORIDE 0.9 % IV SOLN
96.0000 mg/m2 | Freq: Once | INTRAVENOUS | Status: AC
  Administered 2017-01-05: 200 mg via INTRAVENOUS
  Filled 2017-01-05: qty 10

## 2017-01-05 NOTE — Patient Instructions (Signed)
[facility] Discharge Instructions for Patients Receiving Chemotherapy  Today you received the following chemotherapy agents etoposide   To help prevent nausea and vomiting after your treatment, we encourage you to take your nausea medication as directed  If you develop nausea and vomiting that is not controlled by your nausea medication, call the clinic.   BELOW ARE SYMPTOMS THAT SHOULD BE REPORTED IMMEDIATELY:  *FEVER GREATER THAN 100.5 F  *CHILLS WITH OR WITHOUT FEVER  NAUSEA AND VOMITING THAT IS NOT CONTROLLED WITH YOUR NAUSEA MEDICATION  *UNUSUAL SHORTNESS OF BREATH  *UNUSUAL BRUISING OR BLEEDING  TENDERNESS IN MOUTH AND THROAT WITH OR WITHOUT PRESENCE OF ULCERS  *URINARY PROBLEMS  *BOWEL PROBLEMS  UNUSUAL RASH Items with * indicate a potential emergency and should be followed up as soon as possible.  Feel free to call the clinic you have any questions or concerns. The clinic phone number is [phone].  

## 2017-01-06 ENCOUNTER — Ambulatory Visit (HOSPITAL_BASED_OUTPATIENT_CLINIC_OR_DEPARTMENT_OTHER): Payer: PPO

## 2017-01-06 VITALS — BP 142/67 | HR 68 | Temp 98.0°F | Resp 18

## 2017-01-06 DIAGNOSIS — C61 Malignant neoplasm of prostate: Secondary | ICD-10-CM

## 2017-01-06 DIAGNOSIS — Z5189 Encounter for other specified aftercare: Secondary | ICD-10-CM | POA: Diagnosis not present

## 2017-01-06 MED ORDER — PEGFILGRASTIM INJECTION 6 MG/0.6ML ~~LOC~~
6.0000 mg | PREFILLED_SYRINGE | Freq: Once | SUBCUTANEOUS | Status: AC
  Administered 2017-01-06: 6 mg via SUBCUTANEOUS
  Filled 2017-01-06: qty 0.6

## 2017-01-06 NOTE — Patient Instructions (Signed)
Pegfilgrastim injection What is this medicine? PEGFILGRASTIM (PEG fil gra stim) is a long-acting granulocyte colony-stimulating factor that stimulates the growth of neutrophils, a type of white blood cell important in the body's fight against infection. It is used to reduce the incidence of fever and infection in patients with certain types of cancer who are receiving chemotherapy that affects the bone marrow, and to increase survival after being exposed to high doses of radiation. This medicine may be used for other purposes; ask your health care provider or pharmacist if you have questions. COMMON BRAND NAME(S): Neulasta What should I tell my health care provider before I take this medicine? They need to know if you have any of these conditions: -kidney disease -latex allergy -ongoing radiation therapy -sickle cell disease -skin reactions to acrylic adhesives (On-Body Injector only) -an unusual or allergic reaction to pegfilgrastim, filgrastim, other medicines, foods, dyes, or preservatives -pregnant or trying to get pregnant -breast-feeding How should I use this medicine? This medicine is for injection under the skin. If you get this medicine at home, you will be taught how to prepare and give the pre-filled syringe or how to use the On-body Injector. Refer to the patient Instructions for Use for detailed instructions. Use exactly as directed. Take your medicine at regular intervals. Do not take your medicine more often than directed. It is important that you put your used needles and syringes in a special sharps container. Do not put them in a trash can. If you do not have a sharps container, call your pharmacist or healthcare provider to get one. Talk to your pediatrician regarding the use of this medicine in children. While this drug may be prescribed for selected conditions, precautions do apply. Overdosage: If you think you have taken too much of this medicine contact a poison control  center or emergency room at once. NOTE: This medicine is only for you. Do not share this medicine with others. What if I miss a dose? It is important not to miss your dose. Call your doctor or health care professional if you miss your dose. If you miss a dose due to an On-body Injector failure or leakage, a new dose should be administered as soon as possible using a single prefilled syringe for manual use. What may interact with this medicine? Interactions have not been studied. Give your health care provider a list of all the medicines, herbs, non-prescription drugs, or dietary supplements you use. Also tell them if you smoke, drink alcohol, or use illegal drugs. Some items may interact with your medicine. This list may not describe all possible interactions. Give your health care provider a list of all the medicines, herbs, non-prescription drugs, or dietary supplements you use. Also tell them if you smoke, drink alcohol, or use illegal drugs. Some items may interact with your medicine. What should I watch for while using this medicine? You may need blood work done while you are taking this medicine. If you are going to need a MRI, CT scan, or other procedure, tell your doctor that you are using this medicine (On-Body Injector only). What side effects may I notice from receiving this medicine? Side effects that you should report to your doctor or health care professional as soon as possible: -allergic reactions like skin rash, itching or hives, swelling of the face, lips, or tongue -dizziness -fever -pain, redness, or irritation at site where injected -pinpoint red spots on the skin -red or dark-brown urine -shortness of breath or breathing problems -stomach or   side pain, or pain at the shoulder -swelling -tiredness -trouble passing urine or change in the amount of urine Side effects that usually do not require medical attention (report to your doctor or health care professional if they  continue or are bothersome): -bone pain -muscle pain This list may not describe all possible side effects. Call your doctor for medical advice about side effects. You may report side effects to FDA at 1-800-FDA-1088. Where should I keep my medicine? Keep out of the reach of children. Store pre-filled syringes in a refrigerator between 2 and 8 degrees C (36 and 46 degrees F). Do not freeze. Keep in carton to protect from light. Throw away this medicine if it is left out of the refrigerator for more than 48 hours. Throw away any unused medicine after the expiration date. NOTE: This sheet is a summary. It may not cover all possible information. If you have questions about this medicine, talk to your doctor, pharmacist, or health care provider.  2017 Elsevier/Gold Standard (2014-05-15 14:30:14)  

## 2017-01-17 ENCOUNTER — Other Ambulatory Visit: Payer: Self-pay | Admitting: *Deleted

## 2017-01-17 MED ORDER — FUROSEMIDE 20 MG PO TABS: 20.0000 mg | ORAL_TABLET | Freq: Every day | ORAL | 0 refills | Status: DC

## 2017-01-23 ENCOUNTER — Other Ambulatory Visit (HOSPITAL_BASED_OUTPATIENT_CLINIC_OR_DEPARTMENT_OTHER): Payer: PPO

## 2017-01-23 ENCOUNTER — Ambulatory Visit: Payer: PPO

## 2017-01-23 ENCOUNTER — Ambulatory Visit (HOSPITAL_BASED_OUTPATIENT_CLINIC_OR_DEPARTMENT_OTHER): Payer: PPO

## 2017-01-23 ENCOUNTER — Ambulatory Visit (HOSPITAL_BASED_OUTPATIENT_CLINIC_OR_DEPARTMENT_OTHER): Payer: PPO | Admitting: Oncology

## 2017-01-23 ENCOUNTER — Telehealth: Payer: Self-pay | Admitting: Oncology

## 2017-01-23 VITALS — BP 135/63 | HR 69 | Temp 98.5°F | Resp 17 | Ht 70.0 in | Wt 179.7 lb

## 2017-01-23 DIAGNOSIS — C61 Malignant neoplasm of prostate: Secondary | ICD-10-CM | POA: Diagnosis not present

## 2017-01-23 DIAGNOSIS — D6481 Anemia due to antineoplastic chemotherapy: Secondary | ICD-10-CM | POA: Diagnosis not present

## 2017-01-23 DIAGNOSIS — R600 Localized edema: Secondary | ICD-10-CM | POA: Diagnosis not present

## 2017-01-23 DIAGNOSIS — C786 Secondary malignant neoplasm of retroperitoneum and peritoneum: Secondary | ICD-10-CM | POA: Diagnosis not present

## 2017-01-23 DIAGNOSIS — Z5111 Encounter for antineoplastic chemotherapy: Secondary | ICD-10-CM | POA: Diagnosis not present

## 2017-01-23 DIAGNOSIS — Z23 Encounter for immunization: Secondary | ICD-10-CM

## 2017-01-23 DIAGNOSIS — G893 Neoplasm related pain (acute) (chronic): Secondary | ICD-10-CM | POA: Diagnosis not present

## 2017-01-23 DIAGNOSIS — D630 Anemia in neoplastic disease: Secondary | ICD-10-CM

## 2017-01-23 DIAGNOSIS — L299 Pruritus, unspecified: Secondary | ICD-10-CM

## 2017-01-23 DIAGNOSIS — Z95828 Presence of other vascular implants and grafts: Secondary | ICD-10-CM

## 2017-01-23 LAB — CBC WITH DIFFERENTIAL/PLATELET
BASO%: 0.6 % (ref 0.0–2.0)
Basophils Absolute: 0 10*3/uL (ref 0.0–0.1)
EOS%: 2 % (ref 0.0–7.0)
Eosinophils Absolute: 0.1 10*3/uL (ref 0.0–0.5)
HEMATOCRIT: 29.8 % — AB (ref 38.4–49.9)
HEMOGLOBIN: 9.8 g/dL — AB (ref 13.0–17.1)
LYMPH#: 0.6 10*3/uL — AB (ref 0.9–3.3)
LYMPH%: 11.5 % — ABNORMAL LOW (ref 14.0–49.0)
MCH: 29.4 pg (ref 27.2–33.4)
MCHC: 32.8 g/dL (ref 32.0–36.0)
MCV: 89.7 fL (ref 79.3–98.0)
MONO#: 0.8 10*3/uL (ref 0.1–0.9)
MONO%: 15.3 % — ABNORMAL HIGH (ref 0.0–14.0)
NEUT%: 70.6 % (ref 39.0–75.0)
NEUTROS ABS: 3.8 10*3/uL (ref 1.5–6.5)
PLATELETS: 383 10*3/uL (ref 140–400)
RBC: 3.32 10*6/uL — AB (ref 4.20–5.82)
RDW: 24.9 % — ABNORMAL HIGH (ref 11.0–14.6)
WBC: 5.4 10*3/uL (ref 4.0–10.3)

## 2017-01-23 LAB — COMPREHENSIVE METABOLIC PANEL
ALT: 16 U/L (ref 0–55)
AST: 14 U/L (ref 5–34)
Albumin: 3.3 g/dL — ABNORMAL LOW (ref 3.5–5.0)
Alkaline Phosphatase: 60 U/L (ref 40–150)
Anion Gap: 12 mEq/L — ABNORMAL HIGH (ref 3–11)
BUN: 16.3 mg/dL (ref 7.0–26.0)
CO2: 23 meq/L (ref 22–29)
Calcium: 8.7 mg/dL (ref 8.4–10.4)
Chloride: 105 mEq/L (ref 98–109)
Creatinine: 0.9 mg/dL (ref 0.7–1.3)
EGFR: 85 mL/min/{1.73_m2} — ABNORMAL LOW (ref >90)
Glucose: 224 mg/dl — ABNORMAL HIGH (ref 70–140)
POTASSIUM: 3.9 meq/L (ref 3.5–5.1)
Sodium: 141 mEq/L (ref 136–145)
Total Bilirubin: 0.25 mg/dL (ref 0.20–1.20)
Total Protein: 6.2 g/dL — ABNORMAL LOW (ref 6.4–8.3)

## 2017-01-23 MED ORDER — HEPARIN SOD (PORK) LOCK FLUSH 100 UNIT/ML IV SOLN
500.0000 [IU] | Freq: Once | INTRAVENOUS | Status: AC | PRN
  Administered 2017-01-23: 500 [IU]
  Filled 2017-01-23: qty 5

## 2017-01-23 MED ORDER — PALONOSETRON HCL INJECTION 0.25 MG/5ML
0.2500 mg | Freq: Once | INTRAVENOUS | Status: AC
Start: 2017-01-23 — End: 2017-01-23
  Administered 2017-01-23: 0.25 mg via INTRAVENOUS

## 2017-01-23 MED ORDER — INFLUENZA VAC SPLIT QUAD 0.5 ML IM SUSY
0.5000 mL | PREFILLED_SYRINGE | Freq: Once | INTRAMUSCULAR | Status: AC
  Administered 2017-01-23: 0.5 mL via INTRAMUSCULAR
  Filled 2017-01-23: qty 0.5

## 2017-01-23 MED ORDER — SODIUM CHLORIDE 0.9 % IV SOLN
95.0000 mg/m2 | Freq: Once | INTRAVENOUS | Status: AC
  Administered 2017-01-23: 200 mg via INTRAVENOUS
  Filled 2017-01-23: qty 10

## 2017-01-23 MED ORDER — PALONOSETRON HCL INJECTION 0.25 MG/5ML
INTRAVENOUS | Status: AC
  Filled 2017-01-23: qty 5

## 2017-01-23 MED ORDER — SODIUM CHLORIDE 0.9% FLUSH
10.0000 mL | INTRAVENOUS | Status: DC | PRN
  Administered 2017-01-23: 10 mL
  Filled 2017-01-23: qty 10

## 2017-01-23 MED ORDER — SODIUM CHLORIDE 0.9% FLUSH
10.0000 mL | INTRAVENOUS | Status: DC | PRN
  Administered 2017-01-23: 10 mL via INTRAVENOUS
  Filled 2017-01-23: qty 10

## 2017-01-23 MED ORDER — LIDOCAINE-PRILOCAINE 2.5-2.5 % EX CREA: 1.0000 "application " | TOPICAL_CREAM | CUTANEOUS | 0 refills | Status: AC | PRN

## 2017-01-23 MED ORDER — SODIUM CHLORIDE 0.9 % IV SOLN
10.0000 mg | Freq: Once | INTRAVENOUS | Status: AC
  Administered 2017-01-23: 10 mg via INTRAVENOUS
  Filled 2017-01-23: qty 1

## 2017-01-23 MED ORDER — SODIUM CHLORIDE 0.9 % IV SOLN
530.0000 mg | Freq: Once | INTRAVENOUS | Status: AC
  Administered 2017-01-23: 530 mg via INTRAVENOUS
  Filled 2017-01-23: qty 53

## 2017-01-23 MED ORDER — SODIUM CHLORIDE 0.9 % IV SOLN
Freq: Once | INTRAVENOUS | Status: AC
  Administered 2017-01-23: 10:00:00 via INTRAVENOUS

## 2017-01-23 NOTE — Patient Instructions (Signed)
[facility] Discharge Instructions for Patients Receiving Chemotherapy  Today you received the following chemotherapy agents:  Carboplatin, Etoposide  To help prevent nausea and vomiting after your treatment, we encourage you to take your nausea medication as prescribed.   If you develop nausea and vomiting that is not controlled by your nausea medication, call the clinic.   BELOW ARE SYMPTOMS THAT SHOULD BE REPORTED IMMEDIATELY:  *FEVER GREATER THAN 100.5 F  *CHILLS WITH OR WITHOUT FEVER  NAUSEA AND VOMITING THAT IS NOT CONTROLLED WITH YOUR NAUSEA MEDICATION  *UNUSUAL SHORTNESS OF BREATH  *UNUSUAL BRUISING OR BLEEDING  TENDERNESS IN MOUTH AND THROAT WITH OR WITHOUT PRESENCE OF ULCERS  *URINARY PROBLEMS  *BOWEL PROBLEMS  UNUSUAL RASH Items with * indicate a potential emergency and should be followed up as soon as possible.  Feel free to call the clinic you have any questions or concerns. The clinic phone number is [phone].  Please show the CHEMO ALERT CARD at check-in to the Emergency Department and triage nurse.   

## 2017-01-23 NOTE — Progress Notes (Signed)
Hematology and Oncology Follow Up Visit  Frank Larsen [id number] 04-Jun-1945 71 y.o. 01/23/2017 8:40 AM Frank Larsen, Frank Brow, MD   Principle Diagnosis: 71 year old gentleman with prostate cancer diagnosed in 2013 after presenting with a PSA of 3.6 and a Gleason score 5+4 = 9. He has peritoneal carcinomatosis biopsy proven to be adenocarcinoma likely of a prostate primary.   Prior Therapy: He is status post robotic-assisted laparoscopic radical prostatectomy and extended lymphadenectomy with the pathology reveals T2N0.  completed in June 2013. Taxotere chemotherapy at 75 mg/m every 3 weeks started on 03/30/2016. He is status post 9 cycles of therapy.  He developed relapsed disease and a biopsy proven to show neuroendocrine poorly differentiated tumor in June 2018.  Current therapy: Carboplatin and etoposide. Cycle 1 given on 10/24/2016. He is here for evaluation for cycle 5.  Interim History: Frank Larsen presents today for a follow-up visit. Since the last visit, he continues to do well. He completed cycle 4 of chemotherapy without complications. He denied any infusion-related complications, nausea or vomiting. He does report some fatigue but no dyspnea on exertion. His appetite has been excellent and performance status is improving. He was able to travel to [place] and drove both ways to 6 hours at a time. Is able to walk outdoors but dissipate in physical activities.  He does not report any headaches, blurry vision, syncope or seizures. He does not report any fevers, chills, sweats. He does not report any chest pain, palpitation, orthopnea or leg edema. He does not report any cough, wheezing or hemoptysis. He does not report any hematochezia, melena or diarrhea at this time. He has not moved his bowels last few days. He does not report any frequency, urgency or hematuria. He does not report any skeletal complaints. Remaining review of systems and   Medications: I have reviewed  the patient's current medications.  Current Outpatient Prescriptions  Medication Sig Dispense Refill  . atorvastatin (LIPITOR) 10 MG tablet Take 10 mg by mouth daily with breakfast.     . Blood Glucose Monitoring Suppl (ONE TOUCH ULTRA 2) w/Device KIT Use to check blood sugar 2 times per day. 1 each 2  . furosemide (LASIX) 20 MG tablet Take 1 tablet (20 mg total) by mouth daily. 90 tablet 0  . glipiZIDE (GLUCOTROL XL) 5 MG 24 hr tablet Take 1 tablet (5 mg total) by mouth daily with breakfast. 90 tablet 2  . glucose blood (ONE TOUCH ULTRA TEST) test strip Use to check blood sugar 2 times per day. 200 each 5  . hydrOXYzine (ATARAX/VISTARIL) 10 MG tablet TAKE 1 TABLET (10 MG TOTAL) BY MOUTH EVERY 6 (SIX) HOURS AS NEEDED. 60 tablet 0  . lidocaine-prilocaine (EMLA) cream Apply 1 application topically as needed. Apply to port before chemotherapy. 30 g 0  . metFORMIN (GLUCOPHAGE) 1000 MG tablet Take 1 tablet (1,000 mg total) by mouth 2 (two) times daily with a meal. 180 tablet 3  . Multiple Vitamin (MULTIVITAMIN) tablet Take 1 tablet by mouth daily.    Frank Larsen omeprazole (PRILOSEC) 20 MG capsule Take 20 mg by mouth every morning.    Frank Larsen DELICA LANCETS 84Y MISC Use to check blood sugar 2 times per day. 200 each 2  . prochlorperazine (COMPAZINE) 10 MG tablet Take 1 tablet (10 mg total) by mouth every 6 (six) hours as needed for nausea or vomiting. 30 tablet 0  . pseudoephedrine-acetaminophen (TYLENOL SINUS) 30-500 MG TABS tablet Take 1 tablet by mouth every 4 (four) hours as  needed (congestion).      No current facility-administered medications for this visit.      Allergies:  Allergies  Allergen Reactions  . Scallops [Shellfish Allergy] Nausea And Vomiting  . Cephalexin Hives and Rash    Past Medical History, Surgical history, Social history, and Family History were reviewed and updated.   Physical Exam: Blood pressure 135/63, pulse 69, temperature 98.5 F (36.9 C), temperature source Oral,  resp. rate 17, height '5\' 10"'  (1.778 m), weight 179 lb 11.2 oz (81.5 kg), SpO2 100 %. ECOG: 1 General appearance: Alert, awake gentleman without distress. Head: Normocephalic, without obvious abnormality no oral thrush or ulcers. Neck: no adenopathy or masses. Lymph nodes: Cervical, supraclavicular, and axillary nodes normal. Heart:regular rate and rhythm, S1, S2 normal, no murmur, click, rub or gallop Lung:chest clear, no wheezing, rales, normal symmetric air entry Abdomin: soft, without masses or organomegaly. No shifting dullness or ascites. EXT: Lower extremity trace edema noted.  Lab Results: Lab Results  Component Value Date   WBC 5.4 01/23/2017   HGB 9.8 (L) 01/23/2017   HCT 29.8 (L) 01/23/2017   MCV 89.7 01/23/2017   PLT 383 01/23/2017     Chemistry      Component Value Date/Time   NA 141 12/27/2016 0914   K 4.0 12/27/2016 0914   CL 102 03/05/2016 1459   CO2 26 12/27/2016 0914   BUN 13.8 12/27/2016 0914   CREATININE 0.9 12/27/2016 0914   GLU 155 08/24/2015      Component Value Date/Time   CALCIUM 8.6 12/27/2016 0914   ALKPHOS 52 12/27/2016 0914   AST 15 12/27/2016 0914   ALT 15 12/27/2016 0914   BILITOT 0.25 12/27/2016 0914       Impression and Plan:   71 year old gentleman with the following issues:  1. 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 This was noted after presenting with symptoms of nausea, diarrhea and vomiting. He reports poor by mouth intake and weight loss. He does have history of prostate cancer Gleason score 5+4 = 9 resected in 2013.  He is status post biopsy obtained on 03/14/2016 and the final pathology showed metastatic carcinoma that is poorly differentiated that favors prostate cancer. Although the specimen did not stain for PSA his tumor is poorly differentiated likely secretes very little to no PSA. His last PSA was 1.3.  He is S/P Taxotere salvage chemotherapy started on 03/30/2016. He received 9 cycles of therapy  with CT scan on 09/27/2016 showed progression of disease.   He underwent a biopsy of his peritoneal implants on 10/12/2016. The final pathology revealed a poorly differentiated tumor with neuroendocrine features.  He is currently receiving carboplatin and etoposide and continues to tolerated very well.   CT scan obtained on 12/23/2016  showed positive response to therapy. His omental metastasis appears to be reduced with decreased ascites.   The plan is to continue with the same dose and schedule and proceed with cycle 5 today without any dose reduction or delay. He will complete total 6 cycles of therapy and repeat imaging studies after that.   2. Abdominal pain: Nearly resolved at this time. He does take Advil periodically.  3. Antiemetics: Controlled at this time without complications.   4. IV access: Port-A-Cath placed and reports no recent complications.  5. Neutropenia prophylaxis: Neulasta will be needed for growth factor support.   6. Lower extremity edema: Nearly resolved at this time after stopping Taxotere.  7. Pruritus: Prescription for Atarax was given to  patient.   8. Anemia: Related to malignancy and chemotherapy. His hemoglobin is slightly down to 9.8 but no transfusion for the time being.  9. Follow-up: Will be in 3 weeks for cycle 6 of therapy.  Zola Button, MD 9/17/20188:40 AM

## 2017-01-23 NOTE — Telephone Encounter (Signed)
Gave avs and calendar for october °

## 2017-01-24 ENCOUNTER — Ambulatory Visit (HOSPITAL_BASED_OUTPATIENT_CLINIC_OR_DEPARTMENT_OTHER): Payer: PPO

## 2017-01-24 VITALS — BP 137/62 | HR 83 | Temp 97.9°F | Resp 17

## 2017-01-24 DIAGNOSIS — Z5111 Encounter for antineoplastic chemotherapy: Secondary | ICD-10-CM | POA: Diagnosis not present

## 2017-01-24 DIAGNOSIS — C61 Malignant neoplasm of prostate: Secondary | ICD-10-CM

## 2017-01-24 MED ORDER — DEXAMETHASONE SODIUM PHOSPHATE 100 MG/10ML IJ SOLN
10.0000 mg | Freq: Once | INTRAMUSCULAR | Status: AC
  Administered 2017-01-24: 10 mg via INTRAVENOUS
  Filled 2017-01-24: qty 1

## 2017-01-24 MED ORDER — HEPARIN SOD (PORK) LOCK FLUSH 100 UNIT/ML IV SOLN
500.0000 [IU] | Freq: Once | INTRAVENOUS | Status: AC | PRN
  Administered 2017-01-24: 500 [IU]
  Filled 2017-01-24: qty 5

## 2017-01-24 MED ORDER — SODIUM CHLORIDE 0.9% FLUSH
10.0000 mL | INTRAVENOUS | Status: DC | PRN
  Administered 2017-01-24: 10 mL
  Filled 2017-01-24: qty 10

## 2017-01-24 MED ORDER — SODIUM CHLORIDE 0.9 % IV SOLN
Freq: Once | INTRAVENOUS | Status: AC
  Administered 2017-01-24: 08:00:00 via INTRAVENOUS

## 2017-01-24 MED ORDER — SODIUM CHLORIDE 0.9 % IV SOLN
95.0000 mg/m2 | Freq: Once | INTRAVENOUS | Status: AC
  Administered 2017-01-24: 200 mg via INTRAVENOUS
  Filled 2017-01-24: qty 10

## 2017-01-24 NOTE — Patient Instructions (Signed)
[facility] Discharge Instructions for Patients Receiving Chemotherapy  Today you received the following chemotherapy agents etoposide   To help prevent nausea and vomiting after your treatment, we encourage you to take your nausea medication as directed  If you develop nausea and vomiting that is not controlled by your nausea medication, call the clinic.   BELOW ARE SYMPTOMS THAT SHOULD BE REPORTED IMMEDIATELY:  *FEVER GREATER THAN 100.5 F  *CHILLS WITH OR WITHOUT FEVER  NAUSEA AND VOMITING THAT IS NOT CONTROLLED WITH YOUR NAUSEA MEDICATION  *UNUSUAL SHORTNESS OF BREATH  *UNUSUAL BRUISING OR BLEEDING  TENDERNESS IN MOUTH AND THROAT WITH OR WITHOUT PRESENCE OF ULCERS  *URINARY PROBLEMS  *BOWEL PROBLEMS  UNUSUAL RASH Items with * indicate a potential emergency and should be followed up as soon as possible.  Feel free to call the clinic you have any questions or concerns. The clinic phone number is [phone].  

## 2017-01-25 ENCOUNTER — Ambulatory Visit (HOSPITAL_BASED_OUTPATIENT_CLINIC_OR_DEPARTMENT_OTHER): Payer: PPO

## 2017-01-25 VITALS — BP 140/65 | HR 68 | Temp 98.2°F | Resp 18

## 2017-01-25 DIAGNOSIS — C61 Malignant neoplasm of prostate: Secondary | ICD-10-CM

## 2017-01-25 DIAGNOSIS — Z5111 Encounter for antineoplastic chemotherapy: Secondary | ICD-10-CM

## 2017-01-25 MED ORDER — HEPARIN SOD (PORK) LOCK FLUSH 100 UNIT/ML IV SOLN
500.0000 [IU] | Freq: Once | INTRAVENOUS | Status: AC | PRN
  Administered 2017-01-25: 500 [IU]
  Filled 2017-01-25: qty 5

## 2017-01-25 MED ORDER — SODIUM CHLORIDE 0.9 % IV SOLN
95.0000 mg/m2 | Freq: Once | INTRAVENOUS | Status: AC
  Administered 2017-01-25: 200 mg via INTRAVENOUS
  Filled 2017-01-25: qty 10

## 2017-01-25 MED ORDER — SODIUM CHLORIDE 0.9% FLUSH
10.0000 mL | INTRAVENOUS | Status: DC | PRN
  Administered 2017-01-25: 10 mL
  Filled 2017-01-25: qty 10

## 2017-01-25 MED ORDER — SODIUM CHLORIDE 0.9 % IV SOLN
Freq: Once | INTRAVENOUS | Status: AC
  Administered 2017-01-25: 08:00:00 via INTRAVENOUS

## 2017-01-25 MED ORDER — SODIUM CHLORIDE 0.9 % IV SOLN
10.0000 mg | Freq: Once | INTRAVENOUS | Status: AC
  Administered 2017-01-25: 10 mg via INTRAVENOUS
  Filled 2017-01-25: qty 1

## 2017-01-25 NOTE — Patient Instructions (Signed)
[facility] Discharge Instructions for Patients Receiving Chemotherapy  Today you received the following chemotherapy agents Etoposide.   To help prevent nausea and vomiting after your treatment, we encourage you to take your nausea medication as directed.    If you develop nausea and vomiting that is not controlled by your nausea medication, call the clinic.   BELOW ARE SYMPTOMS THAT SHOULD BE REPORTED IMMEDIATELY:  *FEVER GREATER THAN 100.5 F  *CHILLS WITH OR WITHOUT FEVER  NAUSEA AND VOMITING THAT IS NOT CONTROLLED WITH YOUR NAUSEA MEDICATION  *UNUSUAL SHORTNESS OF BREATH  *UNUSUAL BRUISING OR BLEEDING  TENDERNESS IN MOUTH AND THROAT WITH OR WITHOUT PRESENCE OF ULCERS  *URINARY PROBLEMS  *BOWEL PROBLEMS  UNUSUAL RASH Items with * indicate a potential emergency and should be followed up as soon as possible.  Feel free to call the clinic you have any questions or concerns. The clinic phone number is [phone].    

## 2017-01-26 ENCOUNTER — Ambulatory Visit (HOSPITAL_BASED_OUTPATIENT_CLINIC_OR_DEPARTMENT_OTHER): Payer: PPO

## 2017-01-26 VITALS — BP 135/70 | HR 70 | Temp 98.7°F | Resp 18

## 2017-01-26 DIAGNOSIS — Z5189 Encounter for other specified aftercare: Secondary | ICD-10-CM

## 2017-01-26 DIAGNOSIS — C61 Malignant neoplasm of prostate: Secondary | ICD-10-CM

## 2017-01-26 MED ORDER — PEGFILGRASTIM INJECTION 6 MG/0.6ML ~~LOC~~
6.0000 mg | PREFILLED_SYRINGE | Freq: Once | SUBCUTANEOUS | Status: AC
  Administered 2017-01-26: 6 mg via SUBCUTANEOUS
  Filled 2017-01-26: qty 0.6

## 2017-01-27 ENCOUNTER — Ambulatory Visit: Payer: PPO

## 2017-01-28 ENCOUNTER — Other Ambulatory Visit: Payer: Self-pay | Admitting: Oncology

## 2017-01-30 ENCOUNTER — Encounter: Payer: Self-pay | Admitting: Internal Medicine

## 2017-01-30 ENCOUNTER — Ambulatory Visit (INDEPENDENT_AMBULATORY_CARE_PROVIDER_SITE_OTHER): Payer: PPO | Admitting: Internal Medicine

## 2017-01-30 VITALS — BP 124/70 | HR 76 | Ht 73.0 in | Wt 178.0 lb

## 2017-01-30 DIAGNOSIS — E785 Hyperlipidemia, unspecified: Secondary | ICD-10-CM

## 2017-01-30 DIAGNOSIS — E1140 Type 2 diabetes mellitus with diabetic neuropathy, unspecified: Secondary | ICD-10-CM

## 2017-01-30 LAB — POCT GLYCOSYLATED HEMOGLOBIN (HGB A1C): HEMOGLOBIN A1C: 6.8

## 2017-01-30 MED ORDER — GLIPIZIDE ER 5 MG PO TB24: 5.0000 mg | ORAL_TABLET | Freq: Every day | ORAL | 3 refills | Status: DC

## 2017-01-30 MED ORDER — METFORMIN HCL 1000 MG PO TABS: 1000.0000 mg | ORAL_TABLET | Freq: Two times a day (BID) | ORAL | 3 refills | Status: DC

## 2017-01-30 NOTE — Progress Notes (Signed)
Patient ID: Frank Larsen, male   DOB: [birth year], 71 y.o.   MRN: [id number]  HPI: Frank Larsen is a 71 y.o.-year-old male, returning for f/u for DM2, dx in 2013, non-insulin-dependent, uncontrolled, with complications (PN). He saw Dr. Buddy Duty in the past. Last visit with me 4 mo ago.  He has Pr CA metastases (previously dx'ed in 2013). On ChTx (had 9 tx's) - but stopped 2/2 inefficiency >> started a new ChTx protocol.  Still getting Dexamethasone with the txs.He had RxTx in 2015. Getting Neulasta.  He still has itching >> started Atarax.   Last hemoglobin A1c was: Lab Results  Component Value Date   HGBA1C 6.1 09/28/2016   HGBA1C 6.6 06/30/2016   HGBA1C 6.5 03/30/2016  02/17/2015: 7.7% 10/2014: 7.6%  Pt is on a regimen of: - Metformin 1000 mg 2x a day, with meals - Glipizide ER 5 mg daily in am, before b'fast - added 10/2015 He was on Actos 30 mg daily in a.m. - added in 2016 >> stopped 09/06/2016. Sugars did not significantly increase after stopping Actos. Ran out of Januvia 100 mg daily in a.m. >> not covered until the end of the year (doughnut hole)  Pt checks sugars 1x a day: - am:  157-194, 216 >> 135, 164, 210 >> 110-173 - 2h after b'fast: 163, 166, 236 (1h after the meal) >> 101-280 - before lunch: 114-151, 220 >> 174, 255 >> 93-156 >> 92-199, 263 - 2h after lunch: 127, 144 >> 149, 253 >> 124, 161 >> 148, 152 - before dinner: 114, 205 (pain) >> 112-144 >> 101-149 >> 99-142, 177 (snack) - 2h after dinner:  85, 99 >> 171-198 >> 144, 153 >> 123, 168 - bedtime: n/c - nighttime: n/c Lowest sugar was 93 >> 99;  he has hypoglycemia awareness in the 70s..  Highest sugar was 236 >> 263.  Glucometer: One Touch Ultra.  - No CKD, last BUN/creatinine:  Lab Results  Component Value Date   BUN 16.3 01/23/2017   BUN 13.8 12/27/2016   CREATININE 0.9 01/23/2017   CREATININE 0.9 12/27/2016  08/24/2015: 14/1.01, EGFR 73 He had increased microalbumin in the past >> started Losartan  02/2015. - last set of lipids - at goal: No results found for: CHOL, HDL, LDLCALC, LDLDIRECT, TRIG, CHOLHDL 08/24/2015: 141/134/41/74 - to be repeated by PCP in 11/2016 On Atorvastatin. - last eye exam was 10/2016 >> No DR reportedly. + cataract. - + numbness and tingling in his feet.   He also has a h/o Melanoma. He also has HTN, HL.  He will go in a cruise this fall.  ROS: Constitutional: no weight gain/no weight loss, no fatigue, no subjective hyperthermia, no subjective hypothermia Eyes: no blurry vision, no xerophthalmia ENT: no sore throat, no nodules palpated in throat, no dysphagia, no odynophagia, no hoarseness Cardiovascular: no CP/no SOB/no palpitations/no leg swelling (after starting Lasix) Respiratory: no cough/no SOB/no wheezing Gastrointestinal: no N/no V/no D/no C/no acid reflux Musculoskeletal: no muscle aches/no joint aches Skin: no rashes, no hair loss Neurological: no tremors/+ numbness/+ tingling/no dizziness  I reviewed pt's medications, allergies, PMH, social hx, family hx, and changes were documented in the history of present illness. Otherwise, unchanged from my initial visit note.   Past Medical History:  Diagnosis Date  . AK (actinic keratosis)   . Arthritis   . BPH (benign prostatic hyperplasia)   . Chronic low back pain    since 1979  . Diabetes mellitus ORAL MED   type 2  .  Elevated PSA   . GERD (gastroesophageal reflux disease)   . Hearing loss    using bilateral hearing aids  . History of radiation therapy 01/11/14- 03/04/14   prostate bed 6600 cGy 33 sessions  . Hypercholesterolemia   . Hypercholesterolemia   . Hyperlipidemia   . Impaired hearing BILATERAL HEARING AIDS   20% RIGHT HEARING DUE TO VIRUS  . Melanoma ([place])   . Normal cardiac stress test 2010  . Prostate cancer ([place]) 07/20/11   Gleason 9  . Prostate cancer ([place])   . Prostate nodule   . Skin cancer    malignant melanoma of skin   . Tinnitus    stable since 1992  .  Tobacco use    Past Surgical History:  Procedure Laterality Date  . APPENDECTOMY  AGE 87  . CARPOMETACARPAL JOINT ARTHROTOMY  03-22-2006   RIGHT THUMB  . CARPOMETACARPAL JOINT ARTHROTOMY  12-21-2005   LEFT THUMB  . IR GENERIC HISTORICAL  03/22/2016   IR US GUIDE VASC ACCESS RIGHT [facility]  . IR GENERIC HISTORICAL  03/22/2016   IR FLUORO GUIDE PORT INSERTION RIGHT [facility]  . MELANOMA EXCISION  07-12-10   RIGHT NECK  . PROSTATE BIOPSY  07/20/2011   Procedure: BIOPSY TRANSRECTAL ULTRASONIC PROSTATE (TUBP);  Surgeon: Molli Hazard, MD;  [facility];  Service: Urology;  Laterality: N/A;  1 hour requested for this case  Saturation BX  OFFICE TO BRING ULTRASOUND MACHINE    . ROBOT ASSISTED LAPAROSCOPIC RADICAL PROSTATECTOMY  10/19/2011   Procedure: ROBOTIC ASSISTED LAPAROSCOPIC RADICAL PROSTATECTOMY;  Surgeon: Molli Hazard, MD;  Location: [facility];  Service: Urology;  Laterality: N/A;      . SHOULDER OPEN ROTATOR CUFF REPAIR  07-29-2010- RIGHT   AND SUBACROMIAL DECOMPRESSION AROMIOPLASTY  . TONSILLECTOMY  AGE 66   Social History   Social History  . Marital Status: Married    Spouse Name: N/A  . Number of Children: 2   Occupational History  . Retired     Public relations account executive   Social History Main Topics  . Smoking status: Former Smoker -- 1.00 packs/day for 43 years    Types: Cigarettes    Quit date: 12/07/2007  . Smokeless tobacco: Never Used  . Alcohol Use: Yes     Comment: OCCASIONAL  . Drug Use: No   Current Outpatient Prescriptions on File Prior to Visit  Medication Sig Dispense Refill  . atorvastatin (LIPITOR) 10 MG tablet Take 10 mg by mouth daily with breakfast.     . Blood Glucose Monitoring Suppl (ONE TOUCH ULTRA 2) w/Device KIT Use to check blood sugar 2 times per day. 1 each 2  . furosemide (LASIX) 20 MG tablet Take 1 tablet (20 mg total) by mouth daily. 90 tablet 0  . glipiZIDE (GLUCOTROL XL) 5 MG 24 hr tablet  Take 1 tablet (5 mg total) by mouth daily with breakfast. 90 tablet 2  . glucose blood (ONE TOUCH ULTRA TEST) test strip Use to check blood sugar 2 times per day. 200 each 5  . hydrOXYzine (ATARAX/VISTARIL) 10 MG tablet TAKE 1 TABLET (10 MG TOTAL) BY MOUTH EVERY 6 (SIX) HOURS AS NEEDED. 60 tablet 0  . lidocaine-prilocaine (EMLA) cream Apply 1 application topically as needed. Apply to port before chemotherapy. 30 g 0  . metFORMIN (GLUCOPHAGE) 1000 MG tablet Take 1 tablet (1,000 mg total) by mouth 2 (two) times daily with a meal. 180 tablet 3  . Multiple Vitamin (MULTIVITAMIN) tablet  Take 1 tablet by mouth daily.    [name] omeprazole (PRILOSEC) 20 MG capsule Take 20 mg by mouth every morning.    Letta Pate DELICA LANCETS 33G MISC Use to check blood sugar 2 times per day. 200 each 2  . prochlorperazine (COMPAZINE) 10 MG tablet Take 1 tablet (10 mg total) by mouth every 6 (six) hours as needed for nausea or vomiting. 30 tablet 0  . pseudoephedrine-acetaminophen (TYLENOL SINUS) 30-500 MG TABS tablet Take 1 tablet by mouth every 4 (four) hours as needed (congestion).      No current facility-administered medications on file prior to visit.    Allergies  Allergen Reactions  . Scallops [Shellfish Allergy] Nausea And Vomiting  . Cephalexin Hives and Rash   Family History  Problem Relation Age of Onset  . Cancer Mother        breast/lived 15 years post  . Alzheimer's disease Mother        deceased  . Cancer Brother        thyroid  . Heart disease Brother   . Stroke Father        90 years old  . Hypertension Father   . Alcoholism Father   . CVA Father   . Cirrhosis Paternal Grandfather        alcohol-related  . Cancer Paternal Grandmother        unknown type    PE: BP 124/70 (BP Location: Left Arm, Patient Position: Sitting)   Pulse 76   Ht 6\' 1"  (1.854 m)   Wt 178 lb (80.7 kg)   SpO2 97%   BMI 23.48 kg/m  Wt Readings from Last 3 Encounters:  01/30/17 178 lb (80.7 kg)  01/23/17 179 lb  11.2 oz (81.5 kg)  12/27/16 177 lb 14.4 oz (80.7 kg)   Constitutional: overweight, in NAD Eyes: PERRLA, EOMI, no exophthalmos ENT: moist mucous membranes, no thyromegaly, no cervical lymphadenopathy Cardiovascular: RRR, No MRG Respiratory: CTA B Gastrointestinal: abdomen soft, NT, ND, BS+ Musculoskeletal: no deformities, strength intact in all 4 Skin: moist, warm, no rashes Neurological: no tremor with outstretched hands, DTR normal in all 4  ASSESSMENT: 1. DM2, non-insulin-dependent, uncontrolled, with complications - PN  2. HL  PLAN:  1. Patient with long-standing, uncontrolled diabetes (but improved HbA1c levels lately), on oral antidiabetic regimen only. He continues to be on chemotherapy for his metastatic prostate cancer. His previous chemotherapy regimen did not work well so he is now on the new regimen. He is also getting dexamethasone with the treatments. - His sugars remain fairly stable and close to goal in the second half of the day, but they are more fluctuating in a.m. and after breakfast. He is getting his chemotherapy treatments in the morning. - At today's visit, we reviewed his logs and discussed about his occasional hyperglycemic spikes, but overall, his average is at goal - No need to change his diabetes regimen today - I suggested to:  Patient Instructions  Please continue: - Metformin 1000 mg 2x a day, with meals - Glipizide ER 5 mg daily in am, before b'fast  Please return in 4 months with your sugar log.  - today, HbA1c is 6.8% (higher, but still at goal)  - continue checking sugars at different times of the day - check 1x a day, rotating checks - advised for yearly eye exams >> he is UTD - Return to clinic in 4 mo with sugar log    2. HL - reviewed latest Lipid panel 2017 -  at goal. On statin.   Philemon Kingdom, MD PhD [facility] Endocrinology

## 2017-01-30 NOTE — Patient Instructions (Signed)
Please continue: - Metformin 1000 mg 2x a day, with meals - Glipizide ER 5 mg daily in am, before b'fast  Please return in 4 months with your sugar log.

## 2017-01-30 NOTE — Addendum Note (Signed)
Addended by: Caprice Beaver T on: 01/30/2017 09:21 AM   Modules accepted: Orders

## 2017-02-12 ENCOUNTER — Other Ambulatory Visit: Payer: Self-pay | Admitting: Oncology

## 2017-02-13 ENCOUNTER — Ambulatory Visit (HOSPITAL_BASED_OUTPATIENT_CLINIC_OR_DEPARTMENT_OTHER): Payer: PPO

## 2017-02-13 ENCOUNTER — Telehealth: Payer: Self-pay | Admitting: Oncology

## 2017-02-13 ENCOUNTER — Ambulatory Visit: Payer: PPO

## 2017-02-13 ENCOUNTER — Other Ambulatory Visit (HOSPITAL_BASED_OUTPATIENT_CLINIC_OR_DEPARTMENT_OTHER): Payer: PPO

## 2017-02-13 ENCOUNTER — Ambulatory Visit (HOSPITAL_BASED_OUTPATIENT_CLINIC_OR_DEPARTMENT_OTHER): Payer: PPO | Admitting: Oncology

## 2017-02-13 VITALS — BP 131/56 | HR 76 | Temp 98.0°F | Resp 18 | Ht 73.0 in | Wt 187.6 lb

## 2017-02-13 DIAGNOSIS — L299 Pruritus, unspecified: Secondary | ICD-10-CM

## 2017-02-13 DIAGNOSIS — C61 Malignant neoplasm of prostate: Secondary | ICD-10-CM

## 2017-02-13 DIAGNOSIS — C786 Secondary malignant neoplasm of retroperitoneum and peritoneum: Secondary | ICD-10-CM | POA: Diagnosis not present

## 2017-02-13 DIAGNOSIS — R600 Localized edema: Secondary | ICD-10-CM | POA: Diagnosis not present

## 2017-02-13 DIAGNOSIS — Z5111 Encounter for antineoplastic chemotherapy: Secondary | ICD-10-CM | POA: Diagnosis not present

## 2017-02-13 DIAGNOSIS — D6481 Anemia due to antineoplastic chemotherapy: Secondary | ICD-10-CM | POA: Diagnosis not present

## 2017-02-13 DIAGNOSIS — G893 Neoplasm related pain (acute) (chronic): Secondary | ICD-10-CM

## 2017-02-13 DIAGNOSIS — D630 Anemia in neoplastic disease: Secondary | ICD-10-CM | POA: Diagnosis not present

## 2017-02-13 LAB — COMPREHENSIVE METABOLIC PANEL
ALBUMIN: 3.5 g/dL (ref 3.5–5.0)
ALK PHOS: 52 U/L (ref 40–150)
ALT: 16 U/L (ref 0–55)
ANION GAP: 10 meq/L (ref 3–11)
AST: 14 U/L (ref 5–34)
BUN: 13.9 mg/dL (ref 7.0–26.0)
CALCIUM: 8.5 mg/dL (ref 8.4–10.4)
CHLORIDE: 109 meq/L (ref 98–109)
CO2: 25 mEq/L (ref 22–29)
CREATININE: 0.9 mg/dL (ref 0.7–1.3)
EGFR: 86 mL/min/{1.73_m2} — ABNORMAL LOW (ref >90)
Glucose: 142 mg/dl — ABNORMAL HIGH (ref 70–140)
Potassium: 3.9 mEq/L (ref 3.5–5.1)
Sodium: 144 mEq/L (ref 136–145)
Total Protein: 6.4 g/dL (ref 6.4–8.3)

## 2017-02-13 LAB — CBC WITH DIFFERENTIAL/PLATELET
BASO%: 0.4 % (ref 0.0–2.0)
BASOS ABS: 0 10*3/uL (ref 0.0–0.1)
EOS%: 1.8 % (ref 0.0–7.0)
Eosinophils Absolute: 0.2 10*3/uL (ref 0.0–0.5)
HEMATOCRIT: 26.8 % — AB (ref 38.4–49.9)
HEMOGLOBIN: 8.9 g/dL — AB (ref 13.0–17.1)
LYMPH#: 0.6 10*3/uL — AB (ref 0.9–3.3)
LYMPH%: 6.3 % — ABNORMAL LOW (ref 14.0–49.0)
MCH: 31 pg (ref 27.2–33.4)
MCHC: 33 g/dL (ref 32.0–36.0)
MCV: 93.9 fL (ref 79.3–98.0)
MONO#: 0.9 10*3/uL (ref 0.1–0.9)
MONO%: 9 % (ref 0.0–14.0)
NEUT#: 8.2 10*3/uL — ABNORMAL HIGH (ref 1.5–6.5)
NEUT%: 82.5 % — AB (ref 39.0–75.0)
PLATELETS: 322 10*3/uL (ref 140–400)
RBC: 2.86 10*6/uL — ABNORMAL LOW (ref 4.20–5.82)
RDW: 23.2 % — AB (ref 11.0–14.6)
WBC: 9.9 10*3/uL (ref 4.0–10.3)

## 2017-02-13 MED ORDER — DEXAMETHASONE SODIUM PHOSPHATE 100 MG/10ML IJ SOLN
10.0000 mg | Freq: Once | INTRAMUSCULAR | Status: AC
  Administered 2017-02-13: 10 mg via INTRAVENOUS
  Filled 2017-02-13: qty 1

## 2017-02-13 MED ORDER — SODIUM CHLORIDE 0.9 % IV SOLN
Freq: Once | INTRAVENOUS | Status: AC
  Administered 2017-02-13: 09:00:00 via INTRAVENOUS

## 2017-02-13 MED ORDER — PALONOSETRON HCL INJECTION 0.25 MG/5ML
0.2500 mg | Freq: Once | INTRAVENOUS | Status: AC
Start: 2017-02-13 — End: 2017-02-13
  Administered 2017-02-13: 0.25 mg via INTRAVENOUS

## 2017-02-13 MED ORDER — HEPARIN SOD (PORK) LOCK FLUSH 100 UNIT/ML IV SOLN
500.0000 [IU] | Freq: Once | INTRAVENOUS | Status: AC | PRN
  Administered 2017-02-13: 500 [IU]
  Filled 2017-02-13: qty 5

## 2017-02-13 MED ORDER — SODIUM CHLORIDE 0.9 % IV SOLN
530.0000 mg | Freq: Once | INTRAVENOUS | Status: AC
  Administered 2017-02-13: 530 mg via INTRAVENOUS
  Filled 2017-02-13: qty 53

## 2017-02-13 MED ORDER — PALONOSETRON HCL INJECTION 0.25 MG/5ML
INTRAVENOUS | Status: AC
  Filled 2017-02-13: qty 5

## 2017-02-13 MED ORDER — SODIUM CHLORIDE 0.9% FLUSH
10.0000 mL | INTRAVENOUS | Status: DC | PRN
  Administered 2017-02-13: 10 mL
  Filled 2017-02-13: qty 10

## 2017-02-13 MED ORDER — SODIUM CHLORIDE 0.9 % IV SOLN
95.0000 mg/m2 | Freq: Once | INTRAVENOUS | Status: AC
  Administered 2017-02-13: 200 mg via INTRAVENOUS
  Filled 2017-02-13: qty 10

## 2017-02-13 NOTE — Telephone Encounter (Signed)
02/09/17 prescription refill. Prescription scanned into media

## 2017-02-13 NOTE — Patient Instructions (Signed)
Implanted Port Home Guide An implanted port is a type of central line that is placed under the skin. Central lines are used to provide IV access when treatment or nutrition needs to be given through a person's veins. Implanted ports are used for long-term IV access. An implanted port may be placed because:  You need IV medicine that would be irritating to the small veins in your hands or arms.  You need long-term IV medicines, such as antibiotics.  You need IV nutrition for a long period.  You need frequent blood draws for lab tests.  You need dialysis.  Implanted ports are usually placed in the chest area, but they can also be placed in the upper arm, the abdomen, or the leg. An implanted port has two main parts:  Reservoir. The reservoir is round and will appear as a small, raised area under your skin. The reservoir is the part where a needle is inserted to give medicines or draw blood.  Catheter. The catheter is a thin, flexible tube that extends from the reservoir. The catheter is placed into a large vein. Medicine that is inserted into the reservoir goes into the catheter and then into the vein.  How will I care for my incision site? Do not get the incision site wet. Bathe or shower as directed by your health care provider. How is my port accessed? Special steps must be taken to access the port:  Before the port is accessed, a numbing cream can be placed on the skin. This helps numb the skin over the port site.  Your health care provider uses a sterile technique to access the port. ? Your health care provider must put on a mask and sterile gloves. ? The skin over your port is cleaned carefully with an antiseptic and allowed to dry. ? The port is gently pinched between sterile gloves, and a needle is inserted into the port.  Only "non-coring" port needles should be used to access the port. Once the port is accessed, a blood return should be checked. This helps ensure that the port  is in the vein and is not clogged.  If your port needs to remain accessed for a constant infusion, a clear (transparent) bandage will be placed over the needle site. The bandage and needle will need to be changed every week, or as directed by your health care provider.  Keep the bandage covering the needle clean and dry. Do not get it wet. Follow your health care provider's instructions on how to take a shower or bath while the port is accessed.  If your port does not need to stay accessed, no bandage is needed over the port.  What is flushing? Flushing helps keep the port from getting clogged. Follow your health care provider's instructions on how and when to flush the port. Ports are usually flushed with saline solution or a medicine called heparin. The need for flushing will depend on how the port is used.  If the port is used for intermittent medicines or blood draws, the port will need to be flushed: ? After medicines have been given. ? After blood has been drawn. ? As part of routine maintenance.  If a constant infusion is running, the port may not need to be flushed.  How long will my port stay implanted? The port can stay in for as long as your health care provider thinks it is needed. When it is time for the port to come out, surgery will be   done to remove it. The procedure is similar to the one performed when the port was put in. When should I seek immediate medical care? When you have an implanted port, you should seek immediate medical care if:  You notice a bad smell coming from the incision site.  You have swelling, redness, or drainage at the incision site.  You have more swelling or pain at the port site or the surrounding area.  You have a fever that is not controlled with medicine.  This information is not intended to replace advice given to you by your health care provider. Make sure you discuss any questions you have with your health care provider. Document  Released: 04/25/2005 Document Revised: 10/01/2015 Document Reviewed: 12/31/2012 Elsevier Interactive Patient Education  2017 [employer].  

## 2017-02-13 NOTE — Telephone Encounter (Signed)
02/08/17 prescription refill.  Prescription scanned in media

## 2017-02-13 NOTE — Telephone Encounter (Signed)
Gave avs and calendar for October  °

## 2017-02-13 NOTE — Patient Instructions (Signed)
[facility] Discharge Instructions for Patients Receiving Chemotherapy  Today you received the following chemotherapy agents Carboplatin and Etoposide  To help prevent nausea and vomiting after your treatment, we encourage you to take your nausea medication as directed If you develop nausea and vomiting that is not controlled by your nausea medication, call the clinic.   BELOW ARE SYMPTOMS THAT SHOULD BE REPORTED IMMEDIATELY:  *FEVER GREATER THAN 100.5 F  *CHILLS WITH OR WITHOUT FEVER  NAUSEA AND VOMITING THAT IS NOT CONTROLLED WITH YOUR NAUSEA MEDICATION  *UNUSUAL SHORTNESS OF BREATH  *UNUSUAL BRUISING OR BLEEDING  TENDERNESS IN MOUTH AND THROAT WITH OR WITHOUT PRESENCE OF ULCERS  *URINARY PROBLEMS  *BOWEL PROBLEMS  UNUSUAL RASH Items with * indicate a potential emergency and should be followed up as soon as possible.  Feel free to call the clinic should you have any questions or concerns. The clinic phone number is [phone].  Please show the CHEMO ALERT CARD at check-in to the Emergency Department and triage nurse.   

## 2017-02-13 NOTE — Progress Notes (Signed)
Hematology and Oncology Follow Up Visit  Frank Larsen [id number] 30-Sep-1945 71 y.o. 02/13/2017 8:38 AM Frank Larsen, Frank Brow, MD   Principle Diagnosis: 71 year old gentleman with prostate cancer diagnosed in 2013 after presenting with a PSA of 3.6 and a Gleason score 5+4 = 9. He has peritoneal carcinomatosis biopsy proven to be adenocarcinoma likely of a prostate primary.   Prior Therapy: He is status post robotic-assisted laparoscopic radical prostatectomy and extended lymphadenectomy with the pathology reveals T2N0.  completed in June 2013. Taxotere chemotherapy at 75 mg/m every 3 weeks started on 03/30/2016. He is status post 9 cycles of therapy.  He developed relapsed disease and a biopsy proven to show neuroendocrine poorly differentiated tumor in June 2018.  Current therapy: Carboplatin and etoposide. Cycle 1 given on 10/24/2016. He is here for evaluation for cycle 6.  Interim History: Frank Larsen for a follow-up visit. Since the last visit, he reports increased and his fatigue level. He reports despite his fatigue he is still able to perform most activities of daily living. He completed cycle 5 of chemotherapy without any other complications. He denied any infusion-related complications, nausea or vomiting.  His appetite has been excellent and performance status is improving. He still able to participate in some physical activities without any disoriented. He denied any peripheral neuropathy.  He does not report any headaches, blurry vision, syncope or seizures. He does not report any fevers, chills, sweats. He does not report any chest pain, palpitation, orthopnea or leg edema. He does not report any cough, wheezing or hemoptysis. He does not report any hematochezia, melena or diarrhea at this time. He has not moved his bowels last few days. He does not report any frequency, urgency or hematuria. He does not report any skeletal complaints. Remaining review of  systems and   Medications: I have reviewed the patient's current medications.  Current Outpatient Prescriptions  Medication Sig Dispense Refill  . atorvastatin (LIPITOR) 10 MG tablet Take 10 mg by mouth daily with breakfast.     . Blood Glucose Monitoring Suppl (ONE TOUCH ULTRA 2) w/Device KIT Use to check blood sugar 2 times per day. 1 each 2  . furosemide (LASIX) 20 MG tablet Take 1 tablet (20 mg total) by mouth daily. 90 tablet 0  . glipiZIDE (GLUCOTROL XL) 5 MG 24 hr tablet Take 1 tablet (5 mg total) by mouth daily with breakfast. 90 tablet 3  . glucose blood (ONE TOUCH ULTRA TEST) test strip Use to check blood sugar 2 times per day. 200 each 5  . hydrOXYzine (ATARAX/VISTARIL) 10 MG tablet TAKE 1 TABLET (10 MG TOTAL) BY MOUTH EVERY 6 (SIX) HOURS AS NEEDED. 60 tablet 0  . lidocaine-prilocaine (EMLA) cream Apply 1 application topically as needed. Apply to port before chemotherapy. 30 g 0  . metFORMIN (GLUCOPHAGE) 1000 MG tablet Take 1 tablet (1,000 mg total) by mouth 2 (two) times daily with a meal. 180 tablet 3  . Multiple Vitamin (MULTIVITAMIN) tablet Take 1 tablet by mouth daily.    [name] omeprazole (PRILOSEC) 20 MG capsule Take 20 mg by mouth every morning.    [name] DELICA LANCETS 17B MISC Use to check blood sugar 2 times per day. 200 each 2  . prochlorperazine (COMPAZINE) 10 MG tablet Take 1 tablet (10 mg total) by mouth every 6 (six) hours as needed for nausea or vomiting. 30 tablet 0  . pseudoephedrine-acetaminophen (TYLENOL SINUS) 30-500 MG TABS tablet Take 1 tablet by mouth every 4 (four) hours  as needed (congestion).      No current facility-administered medications for this visit.      Allergies:  Allergies  Allergen Reactions  . Scallops [Shellfish Allergy] Nausea And Vomiting  . Cephalexin Hives and Rash    Past Medical History, Surgical history, Social history, and Family History were reviewed and updated.   Physical Exam: Blood pressure (!) 131/56, pulse 76,  temperature 98 F (36.7 C), temperature source Oral, resp. rate 18, height _0  (1.854 m), weight 187 lb 9.6 oz (85.1 kg), SpO2 98 %. ECOG: 1 General appearance: Well-appearing gentleman without distress. Head: Normocephalic, without obvious abnormality no oral thrush. Neck: no adenopathy or masses. Lymph nodes: Cervical, supraclavicular, and axillary nodes normal. Heart:regular rate and rhythm, S1, S2 normal, no murmur, click, rub or gallop Lung:chest clear, no wheezing, rales, normal symmetric air entry Abdomin: soft, without masses or organomegaly. No rebound or guarding. EXT: Lower extremity trace edema noted.  Lab Results: Lab Results  Component Value Date   WBC 9.9 02/13/2017   HGB 8.9 (L) 02/13/2017   HCT 26.8 (L) 02/13/2017   MCV 93.9 02/13/2017   PLT 322 02/13/2017     Chemistry      Component Value Date/Time   NA 141 01/23/2017 0806   K 3.9 01/23/2017 0806   CL 102 03/05/2016 1459   CO2 23 01/23/2017 0806   BUN 16.3 01/23/2017 0806   CREATININE 0.9 01/23/2017 0806   GLU 155 08/24/2015      Component Value Date/Time   CALCIUM 8.7 01/23/2017 0806   ALKPHOS 60 01/23/2017 0806   AST 14 01/23/2017 0806   ALT 16 01/23/2017 0806   BILITOT 0.25 01/23/2017 0806       Impression and Plan:   71 year old gentleman with the following issues:  1. 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 This was noted after presenting with symptoms of nausea, diarrhea and vomiting. He reports poor by mouth intake and weight loss. He does have history of prostate cancer Gleason score 5+4 = 9 resected in 2013.  He is status post biopsy obtained on 03/14/2016 and the final pathology showed metastatic carcinoma that is poorly differentiated that favors prostate cancer. Although the specimen did not stain for PSA his tumor is poorly differentiated likely secretes very little to no PSA. His last PSA was 1.3.  He is S/P Taxotere salvage chemotherapy started on 03/30/2016.  He received 9 cycles of therapy with CT scan on 09/27/2016 showed progression of disease.   He underwent a biopsy of his peritoneal implants on 10/12/2016. The final pathology revealed a poorly differentiated tumor with neuroendocrine features.  He is currently receiving carboplatin and etoposide and continues to tolerated very well.   CT scan obtained on 12/23/2016  showed positive response to therapy. His omental metastasis appears to be reduced with decreased ascites.   The plan is to proceed with cycle 6 without any dose reduction or delay. We will schedule a CT scan for staging purposes in 3 weeks. I anticipate treatment break after conclusion of chemotherapy.   2. Abdominal pain: Resulted this time. Doing very little discomfort.  3. Antiemetics: Controlled at this time without complications.   4. IV access: Port-A-Cath use without issues.  5. Neutropenia prophylaxis: Neulasta will be needed for growth factor support.   6. Lower extremity edema: No changes with significant improvements since last visits.  7. Pruritus: Prescription for Atarax was given to patient.   8. Anemia: Related to malignancy and chemotherapy. His  hemoglobin is is down to 8.9. He is minimally symptomatic at this time and we will hold off on transfusion.  9. Follow-up: Will be in 3 weeks to follow his progress.  Zola Button, MD 10/8/20188:38 AM

## 2017-02-14 ENCOUNTER — Ambulatory Visit (HOSPITAL_BASED_OUTPATIENT_CLINIC_OR_DEPARTMENT_OTHER): Payer: PPO

## 2017-02-14 VITALS — BP 142/58 | HR 78 | Temp 98.4°F | Resp 18

## 2017-02-14 DIAGNOSIS — C61 Malignant neoplasm of prostate: Secondary | ICD-10-CM

## 2017-02-14 DIAGNOSIS — Z5111 Encounter for antineoplastic chemotherapy: Secondary | ICD-10-CM | POA: Diagnosis not present

## 2017-02-14 MED ORDER — SODIUM CHLORIDE 0.9 % IV SOLN
95.0000 mg/m2 | Freq: Once | INTRAVENOUS | Status: AC
  Administered 2017-02-14: 200 mg via INTRAVENOUS
  Filled 2017-02-14: qty 10

## 2017-02-14 MED ORDER — SODIUM CHLORIDE 0.9% FLUSH
10.0000 mL | INTRAVENOUS | Status: DC | PRN
  Administered 2017-02-14: 10 mL
  Filled 2017-02-14: qty 10

## 2017-02-14 MED ORDER — HEPARIN SOD (PORK) LOCK FLUSH 100 UNIT/ML IV SOLN
500.0000 [IU] | Freq: Once | INTRAVENOUS | Status: AC | PRN
  Administered 2017-02-14: 500 [IU]
  Filled 2017-02-14: qty 5

## 2017-02-14 MED ORDER — SODIUM CHLORIDE 0.9 % IV SOLN
Freq: Once | INTRAVENOUS | Status: AC
  Administered 2017-02-14: 08:00:00 via INTRAVENOUS

## 2017-02-14 MED ORDER — SODIUM CHLORIDE 0.9 % IV SOLN
10.0000 mg | Freq: Once | INTRAVENOUS | Status: AC
  Administered 2017-02-14: 10 mg via INTRAVENOUS
  Filled 2017-02-14: qty 1

## 2017-02-14 MED ORDER — DEXAMETHASONE SODIUM PHOSPHATE 10 MG/ML IJ SOLN
INTRAMUSCULAR | Status: AC
  Filled 2017-02-14: qty 1

## 2017-02-14 NOTE — Patient Instructions (Signed)
[facility] Discharge Instructions for Patients Receiving Chemotherapy  Today you received the following chemotherapy agents Etoposide.  To help prevent nausea and vomiting after your treatment, we encourage you to take your nausea medication    If you develop nausea and vomiting that is not controlled by your nausea medication, call the clinic.   BELOW ARE SYMPTOMS THAT SHOULD BE REPORTED IMMEDIATELY:  *FEVER GREATER THAN 100.5 F  *CHILLS WITH OR WITHOUT FEVER  NAUSEA AND VOMITING THAT IS NOT CONTROLLED WITH YOUR NAUSEA MEDICATION  *UNUSUAL SHORTNESS OF BREATH  *UNUSUAL BRUISING OR BLEEDING  TENDERNESS IN MOUTH AND THROAT WITH OR WITHOUT PRESENCE OF ULCERS  *URINARY PROBLEMS  *BOWEL PROBLEMS  UNUSUAL RASH Items with * indicate a potential emergency and should be followed up as soon as possible.  Feel free to call the clinic should you have any questions or concerns. The clinic phone number is [phone].  Please show the CHEMO ALERT CARD at check-in to the Emergency Department and triage nurse.   

## 2017-02-15 ENCOUNTER — Ambulatory Visit (HOSPITAL_BASED_OUTPATIENT_CLINIC_OR_DEPARTMENT_OTHER): Payer: PPO

## 2017-02-15 VITALS — BP 135/61 | HR 65 | Temp 98.1°F | Resp 17

## 2017-02-15 DIAGNOSIS — Z5111 Encounter for antineoplastic chemotherapy: Secondary | ICD-10-CM | POA: Diagnosis not present

## 2017-02-15 DIAGNOSIS — C61 Malignant neoplasm of prostate: Secondary | ICD-10-CM | POA: Diagnosis not present

## 2017-02-15 MED ORDER — SODIUM CHLORIDE 0.9 % IV SOLN
Freq: Once | INTRAVENOUS | Status: AC
  Administered 2017-02-15: 09:00:00 via INTRAVENOUS

## 2017-02-15 MED ORDER — SODIUM CHLORIDE 0.9% FLUSH
10.0000 mL | INTRAVENOUS | Status: DC | PRN
  Administered 2017-02-15: 10 mL
  Filled 2017-02-15: qty 10

## 2017-02-15 MED ORDER — ETOPOSIDE CHEMO INJECTION 1 GM/50ML
95.0000 mg/m2 | Freq: Once | INTRAVENOUS | Status: AC
  Administered 2017-02-15: 200 mg via INTRAVENOUS
  Filled 2017-02-15: qty 10

## 2017-02-15 MED ORDER — HEPARIN SOD (PORK) LOCK FLUSH 100 UNIT/ML IV SOLN
500.0000 [IU] | Freq: Once | INTRAVENOUS | Status: AC | PRN
  Administered 2017-02-15: 500 [IU]
  Filled 2017-02-15: qty 5

## 2017-02-15 MED ORDER — SODIUM CHLORIDE 0.9 % IV SOLN
10.0000 mg | Freq: Once | INTRAVENOUS | Status: AC
  Administered 2017-02-15: 10 mg via INTRAVENOUS
  Filled 2017-02-15: qty 1

## 2017-02-15 NOTE — Patient Instructions (Signed)
[facility] Discharge Instructions for Patients Receiving Chemotherapy  Today you received the following chemotherapy agents Etoposide.  To help prevent nausea and vomiting after your treatment, we encourage you to take your nausea medication    If you develop nausea and vomiting that is not controlled by your nausea medication, call the clinic.   BELOW ARE SYMPTOMS THAT SHOULD BE REPORTED IMMEDIATELY:  *FEVER GREATER THAN 100.5 F  *CHILLS WITH OR WITHOUT FEVER  NAUSEA AND VOMITING THAT IS NOT CONTROLLED WITH YOUR NAUSEA MEDICATION  *UNUSUAL SHORTNESS OF BREATH  *UNUSUAL BRUISING OR BLEEDING  TENDERNESS IN MOUTH AND THROAT WITH OR WITHOUT PRESENCE OF ULCERS  *URINARY PROBLEMS  *BOWEL PROBLEMS  UNUSUAL RASH Items with * indicate a potential emergency and should be followed up as soon as possible.  Feel free to call the clinic should you have any questions or concerns. The clinic phone number is [phone].  Please show the CHEMO ALERT CARD at check-in to the Emergency Department and triage nurse.   

## 2017-02-16 ENCOUNTER — Ambulatory Visit (HOSPITAL_BASED_OUTPATIENT_CLINIC_OR_DEPARTMENT_OTHER): Payer: PPO

## 2017-02-16 VITALS — BP 123/67 | HR 74 | Temp 97.5°F | Resp 18

## 2017-02-16 DIAGNOSIS — C61 Malignant neoplasm of prostate: Secondary | ICD-10-CM | POA: Diagnosis not present

## 2017-02-16 DIAGNOSIS — Z5189 Encounter for other specified aftercare: Secondary | ICD-10-CM | POA: Diagnosis not present

## 2017-02-16 MED ORDER — PEGFILGRASTIM INJECTION 6 MG/0.6ML ~~LOC~~
6.0000 mg | PREFILLED_SYRINGE | Freq: Once | SUBCUTANEOUS | Status: AC
  Administered 2017-02-16: 6 mg via SUBCUTANEOUS
  Filled 2017-02-16: qty 0.6

## 2017-02-16 NOTE — Patient Instructions (Signed)
Pegfilgrastim injection What is this medicine? PEGFILGRASTIM (PEG fil gra stim) is a long-acting granulocyte colony-stimulating factor that stimulates the growth of neutrophils, a type of white blood cell important in the body's fight against infection. It is used to reduce the incidence of fever and infection in patients with certain types of cancer who are receiving chemotherapy that affects the bone marrow, and to increase survival after being exposed to high doses of radiation. This medicine may be used for other purposes; ask your health care provider or pharmacist if you have questions. COMMON BRAND NAME(S): Neulasta What should I tell my health care provider before I take this medicine? They need to know if you have any of these conditions: -kidney disease -latex allergy -ongoing radiation therapy -sickle cell disease -skin reactions to acrylic adhesives (On-Body Injector only) -an unusual or allergic reaction to pegfilgrastim, filgrastim, other medicines, foods, dyes, or preservatives -pregnant or trying to get pregnant -breast-feeding How should I use this medicine? This medicine is for injection under the skin. If you get this medicine at home, you will be taught how to prepare and give the pre-filled syringe or how to use the On-body Injector. Refer to the patient Instructions for Use for detailed instructions. Use exactly as directed. Take your medicine at regular intervals. Do not take your medicine more often than directed. It is important that you put your used needles and syringes in a special sharps container. Do not put them in a trash can. If you do not have a sharps container, call your pharmacist or healthcare provider to get one. Talk to your pediatrician regarding the use of this medicine in children. While this drug may be prescribed for selected conditions, precautions do apply. Overdosage: If you think you have taken too much of this medicine contact a poison control  center or emergency room at once. NOTE: This medicine is only for you. Do not share this medicine with others. What if I miss a dose? It is important not to miss your dose. Call your doctor or health care professional if you miss your dose. If you miss a dose due to an On-body Injector failure or leakage, a new dose should be administered as soon as possible using a single prefilled syringe for manual use. What may interact with this medicine? Interactions have not been studied. Give your health care provider a list of all the medicines, herbs, non-prescription drugs, or dietary supplements you use. Also tell them if you smoke, drink alcohol, or use illegal drugs. Some items may interact with your medicine. This list may not describe all possible interactions. Give your health care provider a list of all the medicines, herbs, non-prescription drugs, or dietary supplements you use. Also tell them if you smoke, drink alcohol, or use illegal drugs. Some items may interact with your medicine. What should I watch for while using this medicine? You may need blood work done while you are taking this medicine. If you are going to need a MRI, CT scan, or other procedure, tell your doctor that you are using this medicine (On-Body Injector only). What side effects may I notice from receiving this medicine? Side effects that you should report to your doctor or health care professional as soon as possible: -allergic reactions like skin rash, itching or hives, swelling of the face, lips, or tongue -dizziness -fever -pain, redness, or irritation at site where injected -pinpoint red spots on the skin -red or dark-brown urine -shortness of breath or breathing problems -stomach or   side pain, or pain at the shoulder -swelling -tiredness -trouble passing urine or change in the amount of urine Side effects that usually do not require medical attention (report to your doctor or health care professional if they  continue or are bothersome): -bone pain -muscle pain This list may not describe all possible side effects. Call your doctor for medical advice about side effects. You may report side effects to FDA at 1-800-FDA-1088. Where should I keep my medicine? Keep out of the reach of children. Store pre-filled syringes in a refrigerator between 2 and 8 degrees C (36 and 46 degrees F). Do not freeze. Keep in carton to protect from light. Throw away this medicine if it is left out of the refrigerator for more than 48 hours. Throw away any unused medicine after the expiration date. NOTE: This sheet is a summary. It may not cover all possible information. If you have questions about this medicine, talk to your doctor, pharmacist, or health care provider.  2017 Elsevier/Gold Standard (2014-05-15 14:30:14)  

## 2017-02-17 ENCOUNTER — Encounter: Payer: Self-pay | Admitting: Oncology

## 2017-02-17 ENCOUNTER — Telehealth: Payer: Self-pay | Admitting: *Deleted

## 2017-02-17 NOTE — Telephone Encounter (Signed)
Per Dr Alen Blew,  Will do chest abdomen and pelvis CT scan.

## 2017-02-27 ENCOUNTER — Encounter: Payer: Self-pay | Admitting: Oncology

## 2017-02-28 ENCOUNTER — Telehealth: Payer: Self-pay

## 2017-02-28 NOTE — Telephone Encounter (Signed)
Pt called that appt only shows CT Chest, explained the CT abd and pelvis is linked in the appt. Also the lab and flush need to be before the CT. High priority in basket sent.

## 2017-03-03 ENCOUNTER — Other Ambulatory Visit (HOSPITAL_BASED_OUTPATIENT_CLINIC_OR_DEPARTMENT_OTHER): Payer: PPO

## 2017-03-03 ENCOUNTER — Other Ambulatory Visit: Payer: PPO

## 2017-03-03 ENCOUNTER — Ambulatory Visit (HOSPITAL_COMMUNITY)
Admission: RE | Admit: 2017-03-03 | Discharge: 2017-03-03 | Disposition: A | Discharge status: Home / Self Care | Payer: PPO | Source: Ambulatory Visit | Attending: Oncology | Admitting: Oncology

## 2017-03-03 ENCOUNTER — Ambulatory Visit (HOSPITAL_BASED_OUTPATIENT_CLINIC_OR_DEPARTMENT_OTHER): Payer: PPO

## 2017-03-03 DIAGNOSIS — Z452 Encounter for adjustment and management of vascular access device: Secondary | ICD-10-CM

## 2017-03-03 DIAGNOSIS — C61 Malignant neoplasm of prostate: Secondary | ICD-10-CM

## 2017-03-03 DIAGNOSIS — I700 Atherosclerosis of aorta: Secondary | ICD-10-CM | POA: Diagnosis not present

## 2017-03-03 DIAGNOSIS — J90 Pleural effusion, not elsewhere classified: Secondary | ICD-10-CM | POA: Diagnosis not present

## 2017-03-03 DIAGNOSIS — K668 Other specified disorders of peritoneum: Secondary | ICD-10-CM | POA: Diagnosis not present

## 2017-03-03 DIAGNOSIS — R918 Other nonspecific abnormal finding of lung field: Secondary | ICD-10-CM | POA: Diagnosis not present

## 2017-03-03 DIAGNOSIS — R188 Other ascites: Secondary | ICD-10-CM | POA: Diagnosis not present

## 2017-03-03 DIAGNOSIS — Z95828 Presence of other vascular implants and grafts: Secondary | ICD-10-CM

## 2017-03-03 LAB — COMPREHENSIVE METABOLIC PANEL
ALK PHOS: 57 U/L (ref 40–150)
ALT: 16 U/L (ref 0–55)
ANION GAP: 10 meq/L (ref 3–11)
AST: 14 U/L (ref 5–34)
Albumin: 3.5 g/dL (ref 3.5–5.0)
BILIRUBIN TOTAL: 0.26 mg/dL (ref 0.20–1.20)
BUN: 11.8 mg/dL (ref 7.0–26.0)
CO2: 26 meq/L (ref 22–29)
Calcium: 8.6 mg/dL (ref 8.4–10.4)
Chloride: 107 mEq/L (ref 98–109)
Creatinine: 0.9 mg/dL (ref 0.7–1.3)
Glucose: 119 mg/dl (ref 70–140)
Potassium: 3.7 mEq/L (ref 3.5–5.1)
SODIUM: 143 meq/L (ref 136–145)
TOTAL PROTEIN: 6.2 g/dL — AB (ref 6.4–8.3)

## 2017-03-03 LAB — CBC WITH DIFFERENTIAL/PLATELET
BASO%: 0.5 % (ref 0.0–2.0)
Basophils Absolute: 0 10*3/uL (ref 0.0–0.1)
EOS%: 2.3 % (ref 0.0–7.0)
Eosinophils Absolute: 0.1 10*3/uL (ref 0.0–0.5)
HCT: 24.9 % — ABNORMAL LOW (ref 38.4–49.9)
HGB: 8.1 g/dL — ABNORMAL LOW (ref 13.0–17.1)
LYMPH%: 11.2 % — AB (ref 14.0–49.0)
MCH: 31.8 pg (ref 27.2–33.4)
MCHC: 32.5 g/dL (ref 32.0–36.0)
MCV: 97.8 fL (ref 79.3–98.0)
MONO#: 0.7 10*3/uL (ref 0.1–0.9)
MONO%: 13.3 % (ref 0.0–14.0)
NEUT%: 72.7 % (ref 39.0–75.0)
NEUTROS ABS: 3.7 10*3/uL (ref 1.5–6.5)
PLATELETS: 199 10*3/uL (ref 140–400)
RBC: 2.55 10*6/uL — AB (ref 4.20–5.82)
RDW: 20.7 % — ABNORMAL HIGH (ref 11.0–14.6)
WBC: 5 10*3/uL (ref 4.0–10.3)
lymph#: 0.6 10*3/uL — ABNORMAL LOW (ref 0.9–3.3)

## 2017-03-03 MED ORDER — IOPAMIDOL (ISOVUE-300) INJECTION 61%
INTRAVENOUS | Status: AC
  Filled 2017-03-03: qty 100

## 2017-03-03 MED ORDER — IOPAMIDOL (ISOVUE-300) INJECTION 61%
100.0000 mL | Freq: Once | INTRAVENOUS | Status: AC | PRN
  Administered 2017-03-03: 100 mL via INTRAVENOUS

## 2017-03-03 MED ORDER — HEPARIN SOD (PORK) LOCK FLUSH 100 UNIT/ML IV SOLN
INTRAVENOUS | Status: AC
  Filled 2017-03-03: qty 5

## 2017-03-03 MED ORDER — SODIUM CHLORIDE 0.9% FLUSH
10.0000 mL | INTRAVENOUS | Status: DC | PRN
  Administered 2017-03-03: 10 mL via INTRAVENOUS
  Filled 2017-03-03: qty 10

## 2017-03-03 MED ORDER — HEPARIN SOD (PORK) LOCK FLUSH 100 UNIT/ML IV SOLN
500.0000 [IU] | Freq: Once | INTRAVENOUS | Status: AC
  Administered 2017-03-03: 500 [IU] via INTRAVENOUS

## 2017-03-03 NOTE — Patient Instructions (Signed)

## 2017-03-06 ENCOUNTER — Ambulatory Visit (HOSPITAL_BASED_OUTPATIENT_CLINIC_OR_DEPARTMENT_OTHER): Payer: PPO | Admitting: Oncology

## 2017-03-06 ENCOUNTER — Telehealth: Payer: Self-pay | Admitting: Oncology

## 2017-03-06 VITALS — BP 142/54 | HR 72 | Temp 97.7°F | Resp 18 | Ht 73.0 in | Wt 188.4 lb

## 2017-03-06 DIAGNOSIS — D630 Anemia in neoplastic disease: Secondary | ICD-10-CM

## 2017-03-06 DIAGNOSIS — C61 Malignant neoplasm of prostate: Secondary | ICD-10-CM | POA: Diagnosis not present

## 2017-03-06 DIAGNOSIS — L299 Pruritus, unspecified: Secondary | ICD-10-CM | POA: Diagnosis not present

## 2017-03-06 DIAGNOSIS — D6481 Anemia due to antineoplastic chemotherapy: Secondary | ICD-10-CM

## 2017-03-06 DIAGNOSIS — C786 Secondary malignant neoplasm of retroperitoneum and peritoneum: Secondary | ICD-10-CM | POA: Diagnosis not present

## 2017-03-06 NOTE — Telephone Encounter (Signed)
Gave patient avs report and appointments for December and January  °

## 2017-03-06 NOTE — Progress Notes (Signed)
Hematology and Oncology Follow Up Visit  Frank Larsen [id number] 09/30/1945 71 y.o. 03/06/2017 9:12 AM [name], [name], MD   Principle Diagnosis: 71 year old gentleman with prostate cancer diagnosed in 2013 after presenting with a PSA of 3.6 and a Gleason score 5+4 = 9. He has peritoneal carcinomatosis biopsy proven to be adenocarcinoma likely of a prostate primary.   Prior Therapy: He is status post robotic-assisted laparoscopic radical prostatectomy and extended lymphadenectomy with the pathology reveals T2N0.  completed in June 2013. Taxotere chemotherapy at 75 mg/m every 3 weeks started on 03/30/2016. He is status post 9 cycles of therapy.  He developed relapsed disease and a biopsy proven to show neuroendocrine poorly differentiated tumor in June 2018. He is S/P Carboplatin and etoposide. Cycle 1 given on 10/24/2016. He completed cycle 6 on February 13, 2017 with near complete response.  Current therapy: Observation and surveillance.  Interim History: Mr. Clos presents today for a follow-up visit. Since the last visit, he completed the 6 cycle of chemotherapy without any delayed complications.  His fatigue continues to improve gradually and his exercise tolerance have also improved. His appetite has been excellent and have gained weight since last visit. He still able to participate in some physical activities without any disoriented. He denied any peripheral neuropathy.  He was able to walk 5 miles with very little to no fatigue.  He denies any dyspnea on exertion.  He does not report any headaches, blurry vision, syncope or seizures. He does not report any fevers, chills, sweats. He does not report any chest pain, palpitation, orthopnea or leg edema. He does not report any cough, wheezing or hemoptysis. He does not report any hematochezia, melena or diarrhea at this time. He has not moved his bowels last few days. He does not report any frequency, urgency or hematuria.  He does not report any skeletal complaints. Remaining review of systems and   Medications: I have reviewed the patient's current medications.  Current Outpatient Prescriptions  Medication Sig Dispense Refill  . atorvastatin (LIPITOR) 10 MG tablet Take 10 mg by mouth daily with breakfast.     . Blood Glucose Monitoring Suppl (ONE TOUCH ULTRA 2) w/Device KIT Use to check blood sugar 2 times per day. 1 each 2  . furosemide (LASIX) 20 MG tablet Take 1 tablet (20 mg total) by mouth daily. 90 tablet 0  . glipiZIDE (GLUCOTROL XL) 5 MG 24 hr tablet Take 1 tablet (5 mg total) by mouth daily with breakfast. 90 tablet 3  . glucose blood (ONE TOUCH ULTRA TEST) test strip Use to check blood sugar 2 times per day. 200 each 5  . hydrOXYzine (ATARAX/VISTARIL) 10 MG tablet TAKE 1 TABLET (10 MG TOTAL) BY MOUTH EVERY 6 (SIX) HOURS AS NEEDED. 60 tablet 0  . lidocaine-prilocaine (EMLA) cream Apply 1 application topically as needed. Apply to port before chemotherapy. 30 g 0  . metFORMIN (GLUCOPHAGE) 1000 MG tablet Take 1 tablet (1,000 mg total) by mouth 2 (two) times daily with a meal. 180 tablet 3  . Multiple Vitamin (MULTIVITAMIN) tablet Take 1 tablet by mouth daily.    [name] omeprazole (PRILOSEC) 20 MG capsule Take 20 mg by mouth every morning.    [name] DELICA LANCETS 27N MISC Use to check blood sugar 2 times per day. 200 each 2  . prochlorperazine (COMPAZINE) 10 MG tablet Take 1 tablet (10 mg total) by mouth every 6 (six) hours as needed for nausea or vomiting. 30 tablet 0  .  pseudoephedrine-acetaminophen (TYLENOL SINUS) 30-500 MG TABS tablet Take 1 tablet by mouth every 4 (four) hours as needed (congestion).      No current facility-administered medications for this visit.      Allergies:  Allergies  Allergen Reactions  . Scallops [Shellfish Allergy] Nausea And Vomiting  . Cephalexin Hives and Rash    Past Medical History, Surgical history, Social history, and Family History were reviewed and  updated.   Physical Exam: Blood pressure (!) 142/54, pulse 72, temperature 97.7 F (36.5 C), temperature source Oral, resp. rate 18, height '6\' 1"'  (1.854 m), weight 188 lb 6.4 oz (85.5 kg), SpO2 96 %. ECOG: 1 General appearance: Alert, awake gentleman without distress. Head: Normocephalic, without obvious abnormality no oral thrush or ulcers. Neck: no adenopathy or thyroid masses. Lymph nodes: Cervical, supraclavicular, and axillary nodes normal. Heart:regular rate and rhythm, S1, S2 normal, no murmur, click, rub or gallop Lung:chest clear, no wheezing, rales, normal symmetric air entry Abdomin: soft, without masses or organomegaly. No shifting dullness or ascites. EXT: Lower extremity trace edema noted.  Lab Results: Lab Results  Component Value Date   WBC 5.0 03/03/2017   HGB 8.1 (L) 03/03/2017   HCT 24.9 (L) 03/03/2017   MCV 97.8 03/03/2017   PLT 199 03/03/2017     Chemistry      Component Value Date/Time   NA 143 03/03/2017 0742   K 3.7 03/03/2017 0742   CL 102 03/05/2016 1459   CO2 26 03/03/2017 0742   BUN 11.8 03/03/2017 0742   CREATININE 0.9 03/03/2017 0742   GLU 155 08/24/2015      Component Value Date/Time   CALCIUM 8.6 03/03/2017 0742   ALKPHOS 57 03/03/2017 0742   AST 14 03/03/2017 0742   ALT 16 03/03/2017 0742   BILITOT 0.26 03/03/2017 0742     EXAM: CT CHEST, ABDOMEN, AND PELVIS WITH CONTRAST  TECHNIQUE: Multidetector CT imaging of the chest, abdomen and pelvis was performed following the standard protocol during bolus administration of intravenous contrast.  CONTRAST:  100 cc Isovue-300  COMPARISON:  CT CAP 12/23/2016  FINDINGS: CT CHEST FINDINGS  Cardiovascular: Right anterior chest wall Port-A-Cath is present with tip terminating in the superior vena cava. Normal heart size. Coronary arterial vascular calcifications.  Mediastinum/Nodes: No enlarged axillary, mediastinal or hilar lymphadenopathy. The esophagus is normal in  appearance.  Lungs/Pleura: Central airways are patent. Re- demonstrated moderate bilateral pleural effusions. Adjacent passive atelectasis. No large area of pulmonary consolidation. No suspicious pulmonary nodule identified. No pneumothorax.  Musculoskeletal: Re- demonstrated sclerotic lesion within the lateral right eighth rib (image 114; series 4, similar to prior. Unchanged 8 mm sclerotic lesion within the left lateral fifth rib (image 82; series 4).  CT ABDOMEN PELVIS FINDINGS  Hepatobiliary: Liver is normal in size and contour. Unchanged subcentimeter low-attenuation lesion left hepatic lobe (image 57; series 2). Gallbladder is unremarkable. No intrahepatic or extrahepatic biliary ductal dilatation.  Pancreas: Unremarkable  Spleen: Unremarkable  Adrenals/Urinary Tract: Normal adrenal glands. Kidneys enhance symmetrically with contrast. No hydronephrosis. Urinary bladder is unremarkable.  Stomach/Bowel: Oral contrast material throughout the colon. No evidence for bowel obstruction. Normal morphology of the stomach.  Vascular/Lymphatic: Normal caliber abdominal aorta. Peripheral calcified atherosclerotic plaque.  Reproductive: Prostate is surgically absent.  Other: Small volume ascites. Unchanged 5 mm small nodule anterior to the spleen (image 56; series 2). Stable slight interval decrease in size of small nodule within the anterior right hemipelvis measuring 2.0 x 0.9 cm (image 116; series 2), previously 2.2 x  0.7 cm. No new or enlarging peritoneal nodules are identified.  Musculoskeletal: Unchanged suspicious 8 mm sclerotic lesion within the right ilium (image 94; series 2). Additional tiny sclerotic foci throughout the axial and appendicular skeleton are stable and potentially represent bone islands.  IMPRESSION: 1. Similar size of intraperitoneal lesions. Minimal volume ascites, unchanged. Unchanged previously described osseous lesions. No  new sites of metastatic disease identified within the chest, abdomen or pelvis. 2. Unchanged moderate layering bilateral pleural effusions. 3. Aortic atherosclerosis.  Impression and Plan:   71 year old gentleman with the following issues:  1. 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 This was noted after presenting with symptoms of nausea, diarrhea and vomiting. He reports poor by mouth intake and weight loss. He does have history of prostate cancer Gleason score 5+4 = 9 resected in 2013.  He is status post biopsy obtained on 03/14/2016 and the final pathology showed metastatic carcinoma that is poorly differentiated that favors prostate cancer. Although the specimen did not stain for PSA his tumor is poorly differentiated likely secretes very little to no PSA. His last PSA was 1.3.  He is S/P Taxotere salvage chemotherapy started on 03/30/2016. He received 9 cycles of therapy with CT scan on 09/27/2016 showed progression of disease.   He underwent a biopsy of his peritoneal implants on 10/12/2016. The final pathology revealed a poorly differentiated tumor with neuroendocrine features.  He is status post 6 cycles of carboplatin etoposide completed in October 2018.  CT scan obtained on 03/03/2017 was personally reviewed and discussed with the patient.  Continues to show positive response to therapy. His omental metastasis appears to be reduced with decreased ascites.   The plan is to continue with active surveillance at this time and repeat CT scan in 3 months.  Difference of his therapy will be used if he develops progression of disease.   2. Abdominal pain: Completely resolved at this time.  He does not take any pain medication.   3. IV access: Port-A-Cath will be flushed every 6 weeks.  4. Lower extremity edema: Resolved at this time.  I asked him to stop Lasix.  5. Pruritus: Prescription for Atarax is available to him but he rarely uses it at this time.   Continues to improve.  6. Anemia: Related to malignancy and chemotherapy. His hemoglobin is is down to 8.1.  He is asymptomatic at this time and will continue to monitor him.  I discussed with him the role of transfusion if needed to and there is no indication for it at this time.  7. Follow-up: Will be in 6 weeks to repeat his laboratory testing in 3 months to have a repeat CT scan.  Zola Button, MD 10/29/20189:12 AM

## 2017-03-23 DIAGNOSIS — C61 Malignant neoplasm of prostate: Secondary | ICD-10-CM | POA: Diagnosis not present

## 2017-03-23 DIAGNOSIS — E1165 Type 2 diabetes mellitus with hyperglycemia: Secondary | ICD-10-CM | POA: Diagnosis not present

## 2017-03-23 DIAGNOSIS — R609 Edema, unspecified: Secondary | ICD-10-CM | POA: Diagnosis not present

## 2017-03-23 DIAGNOSIS — Z7984 Long term (current) use of oral hypoglycemic drugs: Secondary | ICD-10-CM | POA: Diagnosis not present

## 2017-03-23 DIAGNOSIS — K219 Gastro-esophageal reflux disease without esophagitis: Secondary | ICD-10-CM | POA: Diagnosis not present

## 2017-03-23 DIAGNOSIS — C786 Secondary malignant neoplasm of retroperitoneum and peritoneum: Secondary | ICD-10-CM | POA: Diagnosis not present

## 2017-03-23 DIAGNOSIS — E782 Mixed hyperlipidemia: Secondary | ICD-10-CM | POA: Diagnosis not present

## 2017-03-23 DIAGNOSIS — I10 Essential (primary) hypertension: Secondary | ICD-10-CM | POA: Diagnosis not present

## 2017-04-10 ENCOUNTER — Encounter: Payer: Self-pay | Admitting: Oncology

## 2017-04-17 ENCOUNTER — Other Ambulatory Visit: Payer: PPO

## 2017-04-19 ENCOUNTER — Other Ambulatory Visit: Payer: Self-pay | Admitting: Oncology

## 2017-04-20 ENCOUNTER — Encounter: Payer: Self-pay | Admitting: Oncology

## 2017-04-21 ENCOUNTER — Telehealth: Payer: Self-pay | Admitting: Oncology

## 2017-04-21 NOTE — Telephone Encounter (Signed)
Scheduled appt per 12/13 sch msg - spoke with wife regarding appts.

## 2017-04-25 ENCOUNTER — Ambulatory Visit (HOSPITAL_BASED_OUTPATIENT_CLINIC_OR_DEPARTMENT_OTHER): Payer: PPO

## 2017-04-25 ENCOUNTER — Other Ambulatory Visit (HOSPITAL_BASED_OUTPATIENT_CLINIC_OR_DEPARTMENT_OTHER): Payer: PPO

## 2017-04-25 DIAGNOSIS — Z95828 Presence of other vascular implants and grafts: Secondary | ICD-10-CM

## 2017-04-25 DIAGNOSIS — Z452 Encounter for adjustment and management of vascular access device: Secondary | ICD-10-CM | POA: Diagnosis not present

## 2017-04-25 DIAGNOSIS — C61 Malignant neoplasm of prostate: Secondary | ICD-10-CM

## 2017-04-25 LAB — CBC WITH DIFFERENTIAL/PLATELET
BASO%: 0.7 % (ref 0.0–2.0)
BASOS ABS: 0 10*3/uL (ref 0.0–0.1)
EOS ABS: 0.4 10*3/uL (ref 0.0–0.5)
EOS%: 11.1 % — AB (ref 0.0–7.0)
HCT: 33.8 % — ABNORMAL LOW (ref 38.4–49.9)
HGB: 11.1 g/dL — ABNORMAL LOW (ref 13.0–17.1)
LYMPH%: 18.6 % (ref 14.0–49.0)
MCH: 31.3 pg (ref 27.2–33.4)
MCHC: 32.7 g/dL (ref 32.0–36.0)
MCV: 95.8 fL (ref 79.3–98.0)
MONO#: 0.4 10*3/uL (ref 0.1–0.9)
MONO%: 9.8 % (ref 0.0–14.0)
NEUT#: 2.3 10*3/uL (ref 1.5–6.5)
NEUT%: 59.8 % (ref 39.0–75.0)
Platelets: 216 10*3/uL (ref 140–400)
RBC: 3.53 10*6/uL — ABNORMAL LOW (ref 4.20–5.82)
RDW: 14.6 % (ref 11.0–14.6)
WBC: 3.9 10*3/uL — ABNORMAL LOW (ref 4.0–10.3)
lymph#: 0.7 10*3/uL — ABNORMAL LOW (ref 0.9–3.3)

## 2017-04-25 LAB — COMPREHENSIVE METABOLIC PANEL
ALT: 15 U/L (ref 0–55)
AST: 19 U/L (ref 5–34)
Albumin: 3.7 g/dL (ref 3.5–5.0)
Alkaline Phosphatase: 55 U/L (ref 40–150)
Anion Gap: 10 mEq/L (ref 3–11)
BUN: 15.8 mg/dL (ref 7.0–26.0)
CO2: 25 mEq/L (ref 22–29)
Calcium: 9.2 mg/dL (ref 8.4–10.4)
Chloride: 105 mEq/L (ref 98–109)
Creatinine: 0.9 mg/dL (ref 0.7–1.3)
EGFR: >60 mL/min/{1.73_m2} (ref >60)
Glucose: 107 mg/dl (ref 70–140)
Potassium: 3.9 mEq/L (ref 3.5–5.1)
Sodium: 140 mEq/L (ref 136–145)
Total Bilirubin: 0.32 mg/dL (ref 0.20–1.20)
Total Protein: 6.8 g/dL (ref 6.4–8.3)

## 2017-04-25 MED ORDER — SODIUM CHLORIDE 0.9% FLUSH
10.0000 mL | INTRAVENOUS | Status: DC | PRN
  Administered 2017-04-25: 10 mL via INTRAVENOUS
  Filled 2017-04-25: qty 10

## 2017-04-25 MED ORDER — HEPARIN SOD (PORK) LOCK FLUSH 100 UNIT/ML IV SOLN
500.0000 [IU] | Freq: Once | INTRAVENOUS | Status: AC | PRN
  Administered 2017-04-25: 500 [IU] via INTRAVENOUS
  Filled 2017-04-25: qty 5

## 2017-05-16 ENCOUNTER — Other Ambulatory Visit: Payer: Self-pay | Admitting: Oncology

## 2017-05-31 ENCOUNTER — Other Ambulatory Visit: Payer: Self-pay | Admitting: Oncology

## 2017-05-31 ENCOUNTER — Ambulatory Visit (HOSPITAL_COMMUNITY)
Admission: RE | Admit: 2017-05-31 | Discharge: 2017-05-31 | Disposition: A | Discharge status: Home / Self Care | Payer: PPO | Source: Ambulatory Visit | Attending: Oncology | Admitting: Oncology

## 2017-05-31 ENCOUNTER — Inpatient Hospital Stay: Payer: PPO

## 2017-05-31 ENCOUNTER — Encounter (HOSPITAL_COMMUNITY): Payer: Self-pay

## 2017-05-31 ENCOUNTER — Inpatient Hospital Stay: Payer: PPO | Attending: Oncology

## 2017-05-31 DIAGNOSIS — I700 Atherosclerosis of aorta: Secondary | ICD-10-CM | POA: Diagnosis not present

## 2017-05-31 DIAGNOSIS — C61 Malignant neoplasm of prostate: Secondary | ICD-10-CM

## 2017-05-31 DIAGNOSIS — J948 Other specified pleural conditions: Secondary | ICD-10-CM | POA: Insufficient documentation

## 2017-05-31 DIAGNOSIS — Q638 Other specified congenital malformations of kidney: Secondary | ICD-10-CM | POA: Insufficient documentation

## 2017-05-31 DIAGNOSIS — Z9221 Personal history of antineoplastic chemotherapy: Secondary | ICD-10-CM | POA: Insufficient documentation

## 2017-05-31 DIAGNOSIS — J90 Pleural effusion, not elsewhere classified: Secondary | ICD-10-CM | POA: Diagnosis not present

## 2017-05-31 DIAGNOSIS — Z95828 Presence of other vascular implants and grafts: Secondary | ICD-10-CM

## 2017-05-31 DIAGNOSIS — R188 Other ascites: Secondary | ICD-10-CM | POA: Insufficient documentation

## 2017-05-31 DIAGNOSIS — Z8546 Personal history of malignant neoplasm of prostate: Secondary | ICD-10-CM | POA: Insufficient documentation

## 2017-05-31 DIAGNOSIS — I2510 Atherosclerotic heart disease of native coronary artery without angina pectoris: Secondary | ICD-10-CM | POA: Insufficient documentation

## 2017-05-31 DIAGNOSIS — C7951 Secondary malignant neoplasm of bone: Secondary | ICD-10-CM | POA: Diagnosis not present

## 2017-05-31 DIAGNOSIS — K769 Liver disease, unspecified: Secondary | ICD-10-CM | POA: Insufficient documentation

## 2017-05-31 DIAGNOSIS — D630 Anemia in neoplastic disease: Secondary | ICD-10-CM | POA: Insufficient documentation

## 2017-05-31 DIAGNOSIS — R9389 Abnormal findings on diagnostic imaging of other specified body structures: Secondary | ICD-10-CM | POA: Diagnosis not present

## 2017-05-31 LAB — CBC WITH DIFFERENTIAL/PLATELET
BASOS ABS: 0 10*3/uL (ref 0.0–0.1)
Basophils Relative: 0 %
EOS ABS: 0.3 10*3/uL (ref 0.0–0.5)
Eosinophils Relative: 6 %
HCT: 35.7 % — ABNORMAL LOW (ref 38.4–49.9)
Hemoglobin: 11.7 g/dL — ABNORMAL LOW (ref 13.0–17.1)
LYMPHS ABS: 0.8 10*3/uL — AB (ref 0.9–3.3)
Lymphocytes Relative: 16 %
MCH: 30.6 pg (ref 27.2–33.4)
MCHC: 32.8 g/dL (ref 32.0–36.0)
MCV: 93.5 fL (ref 79.3–98.0)
MONO ABS: 0.7 10*3/uL (ref 0.1–0.9)
Monocytes Relative: 14 %
Neutro Abs: 3.4 10*3/uL (ref 1.5–6.5)
Neutrophils Relative %: 64 %
PLATELETS: 244 10*3/uL (ref 140–400)
RBC: 3.82 MIL/uL — ABNORMAL LOW (ref 4.20–5.82)
RDW: 12.8 % (ref 11.0–15.6)
WBC: 5.3 10*3/uL (ref 4.0–10.3)

## 2017-05-31 LAB — COMPREHENSIVE METABOLIC PANEL
ALK PHOS: 56 U/L (ref 40–150)
ALT: 23 U/L (ref 0–55)
ANION GAP: 9 (ref 3–11)
AST: 18 U/L (ref 5–34)
Albumin: 3.7 g/dL (ref 3.5–5.0)
BUN: 21 mg/dL (ref 7–26)
CALCIUM: 9.3 mg/dL (ref 8.4–10.4)
CHLORIDE: 105 mmol/L (ref 98–109)
CO2: 27 mmol/L (ref 22–29)
Creatinine, Ser: 0.98 mg/dL (ref 0.70–1.30)
Glucose, Bld: 127 mg/dL (ref 70–140)
Potassium: 4.2 mmol/L (ref 3.5–5.1)
SODIUM: 141 mmol/L (ref 136–145)
Total Bilirubin: 0.2 mg/dL (ref 0.2–1.2)
Total Protein: 6.8 g/dL (ref 6.4–8.3)

## 2017-05-31 MED ORDER — HEPARIN SOD (PORK) LOCK FLUSH 100 UNIT/ML IV SOLN
500.0000 [IU] | Freq: Once | INTRAVENOUS | Status: AC
  Administered 2017-05-31: 500 [IU] via INTRAVENOUS

## 2017-05-31 MED ORDER — HEPARIN SOD (PORK) LOCK FLUSH 100 UNIT/ML IV SOLN
INTRAVENOUS | Status: AC
  Filled 2017-05-31: qty 5

## 2017-05-31 MED ORDER — IOPAMIDOL (ISOVUE-300) INJECTION 61%
100.0000 mL | Freq: Once | INTRAVENOUS | Status: AC | PRN
  Administered 2017-05-31: 100 mL via INTRAVENOUS

## 2017-05-31 MED ORDER — SODIUM CHLORIDE 0.9% FLUSH
10.0000 mL | INTRAVENOUS | Status: DC | PRN
  Administered 2017-05-31: 10 mL via INTRAVENOUS
  Filled 2017-05-31: qty 10

## 2017-05-31 MED ORDER — IOPAMIDOL (ISOVUE-300) INJECTION 61%
INTRAVENOUS | Status: AC
  Administered 2017-05-31: 100 mL via INTRAVENOUS
  Filled 2017-05-31: qty 100

## 2017-05-31 NOTE — Progress Notes (Signed)
The results of his recent CT scan obtained today was reviewed with the patient over the phone.  He is completely asymptomatic at this time.  He denies any recent trauma or surgical instrumentation.  He denies any shortness of breath or dyspnea on exertion.  The finding on the CT scan suggest a hydropneumothorax which is likely spontaneous.  I give the patient clear instructions to report to the emergency department if he develops any shortness of breath or difficulty breathing or chest pain.  He verbalized he understands these instructions today and he will call me with any questions in the interim.  He has a follow-up on June 02, 2017 which will be moved sooner if needed.

## 2017-05-31 NOTE — Patient Instructions (Signed)
Implanted Port Home Guide An implanted port is a type of central line that is placed under the skin. Central lines are used to provide IV access when treatment or nutrition needs to be given through a person's veins. Implanted ports are used for long-term IV access. An implanted port may be placed because:  You need IV medicine that would be irritating to the small veins in your hands or arms.  You need long-term IV medicines, such as antibiotics.  You need IV nutrition for a long period.  You need frequent blood draws for lab tests.  You need dialysis.  Implanted ports are usually placed in the chest area, but they can also be placed in the upper arm, the abdomen, or the leg. An implanted port has two main parts:  Reservoir. The reservoir is round and will appear as a small, raised area under your skin. The reservoir is the part where a needle is inserted to give medicines or draw blood.  Catheter. The catheter is a thin, flexible tube that extends from the reservoir. The catheter is placed into a large vein. Medicine that is inserted into the reservoir goes into the catheter and then into the vein.  How will I care for my incision site? Do not get the incision site wet. Bathe or shower as directed by your health care provider. How is my port accessed? Special steps must be taken to access the port:  Before the port is accessed, a numbing cream can be placed on the skin. This helps numb the skin over the port site.  Your health care provider uses a sterile technique to access the port. ? Your health care provider must put on a mask and sterile gloves. ? The skin over your port is cleaned carefully with an antiseptic and allowed to dry. ? The port is gently pinched between sterile gloves, and a needle is inserted into the port.  Only "non-coring" port needles should be used to access the port. Once the port is accessed, a blood return should be checked. This helps ensure that the port  is in the vein and is not clogged.  If your port needs to remain accessed for a constant infusion, a clear (transparent) bandage will be placed over the needle site. The bandage and needle will need to be changed every week, or as directed by your health care provider.  Keep the bandage covering the needle clean and dry. Do not get it wet. Follow your health care provider's instructions on how to take a shower or bath while the port is accessed.  If your port does not need to stay accessed, no bandage is needed over the port.  What is flushing? Flushing helps keep the port from getting clogged. Follow your health care provider's instructions on how and when to flush the port. Ports are usually flushed with saline solution or a medicine called heparin. The need for flushing will depend on how the port is used.  If the port is used for intermittent medicines or blood draws, the port will need to be flushed: ? After medicines have been given. ? After blood has been drawn. ? As part of routine maintenance.  If a constant infusion is running, the port may not need to be flushed.  How long will my port stay implanted? The port can stay in for as long as your health care provider thinks it is needed. When it is time for the port to come out, surgery will be   done to remove it. The procedure is similar to the one performed when the port was put in. When should I seek immediate medical care? When you have an implanted port, you should seek immediate medical care if:  You notice a bad smell coming from the incision site.  You have swelling, redness, or drainage at the incision site.  You have more swelling or pain at the port site or the surrounding area.  You have a fever that is not controlled with medicine.  This information is not intended to replace advice given to you by your health care provider. Make sure you discuss any questions you have with your health care provider. Document  Released: 04/25/2005 Document Revised: 10/01/2015 Document Reviewed: 12/31/2012 Elsevier Interactive Patient Education  2017 [employer].  

## 2017-06-01 ENCOUNTER — Ambulatory Visit (INDEPENDENT_AMBULATORY_CARE_PROVIDER_SITE_OTHER): Payer: PPO | Admitting: Internal Medicine

## 2017-06-01 ENCOUNTER — Ambulatory Visit: Payer: PPO | Admitting: Internal Medicine

## 2017-06-01 ENCOUNTER — Encounter: Payer: Self-pay | Admitting: Internal Medicine

## 2017-06-01 VITALS — BP 128/76 | HR 63 | Temp 97.9°F | Resp 16 | Ht 70.5 in | Wt 193.8 lb

## 2017-06-01 DIAGNOSIS — E785 Hyperlipidemia, unspecified: Secondary | ICD-10-CM

## 2017-06-01 DIAGNOSIS — E1140 Type 2 diabetes mellitus with diabetic neuropathy, unspecified: Secondary | ICD-10-CM | POA: Diagnosis not present

## 2017-06-01 LAB — POCT GLYCOSYLATED HEMOGLOBIN (HGB A1C): HEMOGLOBIN A1C: 7.2

## 2017-06-01 NOTE — Progress Notes (Signed)
Patient ID: Frank Larsen, male   DOB: 1945/06/22, 72 y.o.   MRN: [id number]  HPI: Frank Larsen is a 72 y.o.-year-old male, returning for f/u for DM2, dx in 2013, non-insulin-dependent, now more controlled, with complications (PN). He saw Dr. Buddy Duty in the past. Last visit with me 4 months ago.  He has metastatic prostate cancer (diagnosed in 2013).  He was initially on a chemotherapy which did not work, then started a new chemotherapy protocol + Neulasta >> stopped 02/2017.  We was dexamethasone with the treatments.  Radiotherapy in 2015. He had labs and CT scan yesterday >> cancer stable, but he had a pneumothorax.  Last hemoglobin A1c was: Lab Results  Component Value Date   HGBA1C 6.8 01/30/2017   HGBA1C 6.1 09/28/2016   HGBA1C 6.6 06/30/2016  02/17/2015: 7.7% 10/2014: 7.6%  Pt is on a regimen of: - Metformin 1000 mg 2x a day, with meals - Glipizide ER 5 mg in a.m. before breakfast He was on Actos 30 mg daily in a.m. - added in 2016 >> stopped 09/06/2016.  Sugars did not significantly worsen after stopping Actos. Ran out of Januvia 100 mg daily in a.m. >> this was not covered at the end of last year (donut hole).  Pt checks sugars 0-1x a day: - am:  157-194, 216 >> 135, 164, 210 >> 110-173 >> 159-222, 244 - 2h after b'fast: 163, 166, 236 (1h after the meal) >> 101-280 >> 142 - before lunch: 174, 255 >> 93-156 >> 92-199, 263 >> 107-215, 259 - 2h after lunch: 149, 253 >> 124, 161 >> 148, 152 >> 174, 179 - before dinner: 112-144 >> 101-149 >> 99-142, 177 (snack) >> 90-167 >> n/c - 2h after dinner:   171-198 >> 144, 153 >> 123, 168 - bedtime: n/c - nighttime: n/c Lowest sugar was 93 >> 99 >> 90;  he has hypoglycemia awareness in the 70s. Highest sugar was 236 >> 263 >> 244  Glucometer: One Touch Ultra.  - No CKD, last BUN/creatinine:  Lab Results  Component Value Date   BUN 21 05/31/2017   BUN 15.8 12020-09-1316   CREATININE 0.98 05/31/2017   CREATININE 0.9 12020-09-1316   08/24/2015: 14/1.01, EGFR 73 He had increased microalbumin in the past -continues on losartan. -+ HL; last set of lipids: No results found for: CHOL, HDL, LDLCALC, LDLDIRECT, TRIG, CHOLHDL 08/24/2015: 141/134/41/74  On atorvastatin. - last eye exam was 10/2016: No DR reportedly.  + Cataract. -+ Numbness and tingling in his feet.   He also has a h/o Melanoma.  He also has HTN.  ROS: Constitutional: + weight gain/no weight loss, no fatigue, no subjective hyperthermia, no subjective hypothermia Eyes: no blurry vision, no xerophthalmia ENT: no sore throat, no nodules palpated in throat, no dysphagia, no odynophagia, no hoarseness Cardiovascular: no CP/no SOB/no palpitations/no leg swelling Respiratory: no cough/no SOB/no wheezing Gastrointestinal: no N/no V/no D/no C/no acid reflux Musculoskeletal: no muscle aches/no joint aches Skin: no rashes, no hair loss Neurological: no tremors/+ numbness/no tingling/no dizziness  I reviewed pt's medications, allergies, PMH, social hx, family hx, and changes were documented in the history of present illness. Otherwise, unchanged from my initial visit note.   Past Medical History:  Diagnosis Date  . AK (actinic keratosis)   . Arthritis   . BPH (benign prostatic hyperplasia)   . Chronic low back pain    since 1979  . Diabetes mellitus ORAL MED   type 2  . Elevated PSA   . GERD (  gastroesophageal reflux disease)   . Hearing loss    using bilateral hearing aids  . History of radiation therapy 01/11/14- 03/04/14   prostate bed 6600 cGy 33 sessions  . Hypercholesterolemia   . Hypercholesterolemia   . Hyperlipidemia   . Impaired hearing BILATERAL HEARING AIDS   20% RIGHT HEARING DUE TO VIRUS  . Melanoma ([place])   . Normal cardiac stress test 2010  . Prostate cancer ([place]) 07/20/11   Gleason 9  . Prostate cancer ([place])   . Prostate nodule   . Skin cancer    malignant melanoma of skin   . Tinnitus    stable since 1992  . Tobacco use    Past  Surgical History:  Procedure Laterality Date  . APPENDECTOMY  AGE 88  . CARPOMETACARPAL JOINT ARTHROTOMY  03-22-2006   RIGHT THUMB  . CARPOMETACARPAL JOINT ARTHROTOMY  12-21-2005   LEFT THUMB  . IR GENERIC HISTORICAL  03/22/2016   IR US GUIDE VASC ACCESS RIGHT [facility]  . IR GENERIC HISTORICAL  03/22/2016   IR FLUORO GUIDE PORT INSERTION RIGHT [facility]  . MELANOMA EXCISION  07-12-10   RIGHT NECK  . PROSTATE BIOPSY  07/20/2011   Procedure: BIOPSY TRANSRECTAL ULTRASONIC PROSTATE (TUBP);  Surgeon: Molli Hazard, MD;  Location: [facility];  Service: Urology;  Laterality: N/A;  1 hour requested for this case  Saturation BX  OFFICE TO BRING ULTRASOUND MACHINE    . ROBOT ASSISTED LAPAROSCOPIC RADICAL PROSTATECTOMY  10/19/2011   Procedure: ROBOTIC ASSISTED LAPAROSCOPIC RADICAL PROSTATECTOMY;  Surgeon: Molli Hazard, MD;  Location: [facility];  Service: Urology;  Laterality: N/A;      . SHOULDER OPEN ROTATOR CUFF REPAIR  07-29-2010- RIGHT   AND SUBACROMIAL DECOMPRESSION AROMIOPLASTY  . TONSILLECTOMY  AGE 42   Social History   Social History  . Marital Status: Married    Spouse Name: N/A  . Number of Children: 2   Occupational History  . Retired     Public relations account executive   Social History Main Topics  . Smoking status: Former Smoker -- 1.00 packs/day for 43 years    Types: Cigarettes    Quit date: 12/07/2007  . Smokeless tobacco: Never Used  . Alcohol Use: Yes     Comment: OCCASIONAL  . Drug Use: No   Current Outpatient Medications on File Prior to Visit  Medication Sig Dispense Refill  . atorvastatin (LIPITOR) 10 MG tablet Take 10 mg by mouth daily with breakfast.     . Blood Glucose Monitoring Suppl (ONE TOUCH ULTRA 2) w/Device KIT Use to check blood sugar 2 times per day. 1 each 2  . furosemide (LASIX) 20 MG tablet TAKE 1 TABLET BY MOUTH EVERY DAY 90 tablet 0  . glipiZIDE (GLUCOTROL XL) 5 MG 24 hr tablet Take 1 tablet (5 mg total) by mouth  daily with breakfast. 90 tablet 3  . glucose blood (ONE TOUCH ULTRA TEST) test strip Use to check blood sugar 2 times per day. 200 each 5  . hydrOXYzine (ATARAX/VISTARIL) 10 MG tablet TAKE 1 TABLET (10 MG TOTAL) BY MOUTH EVERY 6 (SIX) HOURS AS NEEDED. 60 tablet 0  . lidocaine-prilocaine (EMLA) cream Apply 1 application topically as needed. Apply to port before chemotherapy. 30 g 0  . metFORMIN (GLUCOPHAGE) 1000 MG tablet Take 1 tablet (1,000 mg total) by mouth 2 (two) times daily with a meal. 180 tablet 3  . Multiple Vitamin (MULTIVITAMIN) tablet Take 1 tablet by mouth daily.    [name]  omeprazole (PRILOSEC) 20 MG capsule Take 20 mg by mouth every morning.    [name] DELICA LANCETS 81K MISC Use to check blood sugar 2 times per day. 200 each 2  . prochlorperazine (COMPAZINE) 10 MG tablet Take 1 tablet (10 mg total) by mouth every 6 (six) hours as needed for nausea or vomiting. 30 tablet 0  . pseudoephedrine-acetaminophen (TYLENOL SINUS) 30-500 MG TABS tablet Take 1 tablet by mouth every 4 (four) hours as needed (congestion).      No current facility-administered medications on file prior to visit.    Allergies  Allergen Reactions  . Scallops [Shellfish Allergy] Nausea And Vomiting  . Cephalexin Hives and Rash   Family History  Problem Relation Age of Onset  . Cancer Mother        breast/lived 13 years post  . Alzheimer's disease Mother        deceased  . Cancer Brother        thyroid  . Heart disease Brother   . Stroke Father        81 years old  . Hypertension Father   . Alcoholism Father   . CVA Father   . Cirrhosis Paternal Grandfather        alcohol-related  . Cancer Paternal Grandmother        unknown type    PE: BP 128/76 (BP Location: Left Arm, Patient Position: Sitting, Cuff Size: Normal)   Pulse 63   Temp 97.9 F (36.6 C) (Oral)   Resp 16   Ht 5' 10.5" (1.791 m)   Wt 193 lb 12.8 oz (87.9 kg)   SpO2 96%   BMI 27.41 kg/m  Wt Readings from Last 3 Encounters:   06/01/17 193 lb 12.8 oz (87.9 kg)  03/06/17 188 lb 6.4 oz (85.5 kg)  02/13/17 187 lb 9.6 oz (85.1 kg)   Constitutional: overweight, in NAD Eyes: PERRLA, EOMI, no exophthalmos ENT: moist mucous membranes, no thyromegaly, no cervical lymphadenopathy Cardiovascular: RRR, No MRG Respiratory: CTA B Gastrointestinal: abdomen soft, NT, ND, BS+ Musculoskeletal: no deformities, strength intact in all 4 Skin: moist, warm, no rashes Neurological: no tremor with outstretched hands, DTR normal in all 4  ASSESSMENT: 1. DM2, non-insulin-dependent, now more controlled, with complications - PN  2. HL  PLAN:  1. Patient with long-standing, uncontrolled, diabetes, but with improved HbA1c lately, on oral antidiabetic regimen only.  He continues to be on chemotherapy for his metastatic prostate cancer.  He is getting dexamethasone with the treatments.  At last visit, his sugars are fairly stable and close to goal but they were more fluctuating in the morning after breakfast.  We did not change the regimen at that time - sugars higher especially over the Holidays  >> more spikes - we discussed adding back Januvia but he would like to work on his diet - he would prefer to increase Glipizide before adding Januvia, if needed - will keep this in mind, but for now, will continue current regimen - I suggested to:  Patient Instructions  Please continue: - Metformin 1000 mg 2x a day, with meals - Glipizide ER 5 mg in a.m. before breakfast  Please return in 3-4 months with your sugar log.  - today, HbA1c is 7.2% (higher) - continue checking sugars at different times of the day - check 1x a day, rotating checks - advised for yearly eye exams >> he is UTD - Return to clinic in 3 mo with sugar log    2. HL -  Reviewed latest lipid panel from 2007: At goal >> he will have anther one by PCP in 2019 - Continues on a statin without side effects   Philemon Kingdom, MD PhD [facility] Endocrinology

## 2017-06-01 NOTE — Patient Instructions (Addendum)
Please continue: - Metformin 1000 mg 2x a day, with meals - Glipizide ER 5 mg in a.m. before breakfast  Please return in 3-4 months with your sugar log.

## 2017-06-02 ENCOUNTER — Telehealth: Payer: Self-pay | Admitting: Oncology

## 2017-06-02 ENCOUNTER — Inpatient Hospital Stay (HOSPITAL_BASED_OUTPATIENT_CLINIC_OR_DEPARTMENT_OTHER): Payer: PPO | Admitting: Oncology

## 2017-06-02 VITALS — BP 144/67 | HR 68 | Temp 97.9°F | Resp 18 | Ht 70.5 in | Wt 196.3 lb

## 2017-06-02 DIAGNOSIS — Z8546 Personal history of malignant neoplasm of prostate: Secondary | ICD-10-CM

## 2017-06-02 DIAGNOSIS — C61 Malignant neoplasm of prostate: Secondary | ICD-10-CM

## 2017-06-02 DIAGNOSIS — Z9221 Personal history of antineoplastic chemotherapy: Secondary | ICD-10-CM | POA: Diagnosis not present

## 2017-06-02 DIAGNOSIS — J90 Pleural effusion, not elsewhere classified: Secondary | ICD-10-CM | POA: Diagnosis not present

## 2017-06-02 DIAGNOSIS — J948 Other specified pleural conditions: Secondary | ICD-10-CM | POA: Diagnosis not present

## 2017-06-02 DIAGNOSIS — D630 Anemia in neoplastic disease: Secondary | ICD-10-CM | POA: Diagnosis not present

## 2017-06-02 NOTE — Telephone Encounter (Signed)
Gave avs and calendar for march °

## 2017-06-02 NOTE — Progress Notes (Signed)
Hematology and Oncology Follow Up Visit  Frank Larsen [id number] Jul 16, 1945 72 y.o. 06/02/2017 2:57 PM Frank Larsen, [name], MD   Principle Diagnosis: 72 year old gentleman with metastatic Prostate cancer.  He was initially diagnosed in 2013 after presenting with a PSA of 3.6 and a Gleason score 5+4 = 9. He developed peritoneal carcinomatosis biopsy proven to be adenocarcinoma in 2017.   Prior Therapy:  He is status post robotic-assisted laparoscopic radical prostatectomy and extended lymphadenectomy with the pathology reveals T2N0.  completed in June 2013. Taxotere chemotherapy at 75 mg/m every 3 weeks started on 03/30/2016. He is status post 9 cycles of therapy.  He developed relapsed disease and a biopsy proven to show neuroendocrine poorly differentiated tumor in June 2018. He is S/P Carboplatin and etoposide. Cycle 1 given on 10/24/2016. He completed cycle 6 on February 13, 2017 with near complete response.  Current therapy: Observation and surveillance.  Interim History: [name] is here for a follow-up visit with his wife.  He continues to feel reasonably well without any changes in his health.  He does have very mild abdominal discomfort that is barely noticeable.  It is graded 1 out of 10 when it occurs.  He denies any respiratory complaints at this time.  He denies any weight loss or appetite changes.  He denies any lower extremity edema or skin rashes.  He continues to perform activities of daily living without any decline.  He denies any changes in his performance status.  His quality of life remain excellent.   He does not report any headaches, blurry vision, syncope or seizures. He does not report any fevers, chills, sweats. He does not report any chest pain, palpitation, orthopnea or leg edema. He does not report any cough, wheezing or hemoptysis. He does not report any hematochezia, melena or diarrhea at this time.[name] He does not report any frequency, urgency or  hematuria. He does not report any skeletal complaints.  He denied any heat or cold intolerance.  He does not report any skin rashes or lesions.  Remaining review of systems is negative.   Medications: I have reviewed the patient's current medications.  Current Outpatient Medications  Medication Sig Dispense Refill  . atorvastatin (LIPITOR) 10 MG tablet Take 10 mg by mouth daily with breakfast.     . Blood Glucose Monitoring Suppl (ONE TOUCH ULTRA 2) w/Device KIT Use to check blood sugar 2 times per day. 1 each 2  . furosemide (LASIX) 20 MG tablet TAKE 1 TABLET BY MOUTH EVERY DAY (Patient not taking: Reported on 06/01/2017) 90 tablet 0  . glipiZIDE (GLUCOTROL XL) 5 MG 24 hr tablet Take 1 tablet (5 mg total) by mouth daily with breakfast. 90 tablet 3  . glucose blood (ONE TOUCH ULTRA TEST) test strip Use to check blood sugar 2 times per day. 200 each 5  . hydrOXYzine (ATARAX/VISTARIL) 10 MG tablet TAKE 1 TABLET (10 MG TOTAL) BY MOUTH EVERY 6 (SIX) HOURS AS NEEDED. 60 tablet 0  . lidocaine-prilocaine (EMLA) cream Apply 1 application topically as needed. Apply to port before chemotherapy. 30 g 0  . metFORMIN (GLUCOPHAGE) 1000 MG tablet Take 1 tablet (1,000 mg total) by mouth 2 (two) times daily with a meal. 180 tablet 3  . Multiple Vitamin (MULTIVITAMIN) tablet Take 1 tablet by mouth daily.    [name] omeprazole (PRILOSEC) 20 MG capsule Take 20 mg by mouth every morning.    [name] DELICA LANCETS 46N MISC Use to check blood sugar 2 times  per day. 200 each 2  . prochlorperazine (COMPAZINE) 10 MG tablet Take 1 tablet (10 mg total) by mouth every 6 (six) hours as needed for nausea or vomiting. 30 tablet 0  . pseudoephedrine-acetaminophen (TYLENOL SINUS) 30-500 MG TABS tablet Take 1 tablet by mouth every 4 (four) hours as needed (congestion).      No current facility-administered medications for this visit.      Allergies:  Allergies  Allergen Reactions  . Scallops [Shellfish Allergy] Nausea And  Vomiting  . Cephalexin Hives and Rash    Past Medical History, Surgical history, Social history, and Family History were reviewed and updated.   Physical Exam: Blood pressure (!) 144/67, pulse 68, temperature 97.9 F (36.6 C), temperature source Oral, resp. rate 18, height 5' 10.5" (1.791 m), weight 196 lb 4.8 oz (89 kg), SpO2 96 %. ECOG: 1 General appearance: Well-appearing gentleman appeared without distress. Head: Normocephalic, without obvious abnormality  Oral mucosa without oral ulcers or thrush. Lymph nodes: Cervical, supraclavicular, and axillary nodes normal. Heart:regular rate and rhythm, S1, S2 normal, no murmur, click, rub or gallop Lung: Decreased breath sounds on the right base more than left.  No wheezes or rhonchi. Abdomin: soft, without masses or organomegaly.  Good bowel sounds auscultated in all quadrants. Musculoskeletal: No joint deformity or effusion.  Lab Results: Lab Results  Component Value Date   WBC 5.3 05/31/2017   HGB 11.7 (L) 05/31/2017   HCT 35.7 (L) 05/31/2017   MCV 93.5 05/31/2017   PLT 244 05/31/2017     Chemistry      Component Value Date/Time   NA 141 05/31/2017 1159   NA 140 1August 08, 202018 1136   K 4.2 05/31/2017 1159   K 3.9 1August 08, 202018 1136   CL 105 05/31/2017 1159   CO2 27 05/31/2017 1159   CO2 25 1August 08, 202018 1136   BUN 21 05/31/2017 1159   BUN 15.8 1August 08, 202018 1136   CREATININE 0.98 05/31/2017 1159   CREATININE 0.9 1August 08, 202018 1136   GLU 155 08/24/2015      Component Value Date/Time   CALCIUM 9.3 05/31/2017 1159   CALCIUM 9.2 1August 08, 202018 1136   ALKPHOS 56 05/31/2017 1159   ALKPHOS 55 1August 08, 202018 1136   AST 18 05/31/2017 1159   AST 19 1August 08, 202018 1136   ALT 23 05/31/2017 1159   ALT 15 1August 08, 202018 1136   BILITOT 0.2 05/31/2017 1159   BILITOT 0.32 1August 08, 202018 1136      EXAM: CT CHEST, ABDOMEN, AND PELVIS WITH CONTRAST  TECHNIQUE: Multidetector CT imaging of the chest, abdomen and pelvis was performed following the standard  protocol during bolus administration of intravenous contrast.  CONTRAST:  One hundred cc Isovue-300  COMPARISON:  03/03/2017  FINDINGS: CT CHEST FINDINGS  Cardiovascular: Right Port-A-Cath tip: Cavoatrial junction.  Coronary and aortic arch atherosclerotic calcification.  Mediastinum/Nodes: Right upper mediastinal node 0.7 cm in short axis on image 13/2, stable. No pathologic thoracic adenopathy is identified. Subcarinal lymph node 0.8 cm in short axis on image 32/2, formerly the same.  Lungs/Pleura: Right hydropneumothorax with about 15% of the right hemithorax filled with pleural space air, and with about 25% of the right thorax filled with fluid in the pleural space. There is a small air-fluid level laterally and medially at the lung base. Associated passive atelectasis is present as well as some progressive bandlike moderate atelectasis posteriorly in the right upper lobe. A specific cause for this pneumothorax is not identified. There is no appreciable pneumomediastinum although some of the pneumothorax tracks along the pericardial  adipose margins.  In contrast the left pleural effusion that was previously present has nearly completely resolved. There is some minimal atelectasis along the left hemidiaphragm.  Musculoskeletal: The small sclerotic lesion in the left fifth rib is again observed. Larger sclerotic lesion posterolaterally in the right eighth rib is again noted. I do not see a definite pathologic fracture of the eighth rib.  Mild thoracic spondylosis.  CT ABDOMEN PELVIS FINDINGS  Hepatobiliary: 0.9 by 0.9 cm hypodense lesion in the lateral segment left hepatic lobe on image 55/2 appears stable by my measurements.  Mildly contracted gallbladder.  Pancreas: Unremarkable  Spleen: The perisplenic nodularity shown on the prior exam is slightly less conspicuous today, although there is some trace perisplenic ascites which is increased. A 5 mm  anterior perisplenic nodule is shown on image 92/4.  Adrenals/Urinary Tract: Duplicated left renal collecting system. Adrenal glands normal.  Stomach/Bowel: Unremarkable  Vascular/Lymphatic: Aortoiliac atherosclerotic vascular disease. Small porta hepatis and peripancreatic lymph nodes are observed but are not pathologically enlarged.  Reproductive: Prostatectomy.  Other: Stable presacral and perirectal stranding, likely therapy related. There is some trace fluid tracking along fascia planes in the pelvis along with trace ascites in the right paracolic gutter and along the right inferior hepatic margin, mildly increased.  There is stranding in the omentum similar to prior without overt nodularity.  Bandlike density at the site of the prior lower anterior pelvic nodule currently measures about 6 mm in thickness, previously 7 mm by my measurement on 03/03/2017. Back on 09/27/2016 there was a 4.0 by 2.3 cm mass in this area.  Musculoskeletal: Mild to moderate degenerative arthropathy of both hips. Stable small sclerotic lesion in the right iliac bone measures 8 mm in long axis on image 91/2. Several small pelvic bone islands are present. No new osseous metastatic lesions are identified.  IMPRESSION: 1. Hydropneumothorax on the right with the gas component about 15% of right hemithoracic volume and the fluid component about 25% of right hemithoracic volume. No specific cause for this pneumothorax is identified. The sclerotic right eighth rib lesion is unchanged and there is no associated pathologic fracture identified. 2. Stable appearance of the 2 other osseous metastatic lesions, in the right iliac bone and left fifth rib. No new bony lesions. 3. Near resolution of the left pleural effusion. 4. Stable stranding along the omentum with similar mild perisplenic nodularity and minimal bandlike density in the anterior pelvis which is stable from the most recent prior  exam. On older exams there was a mass lesion in the anterior pelvis. 5. Trace ascites is mildly increased from prior, most notable long the inferior right hepatic margin. 6. Other imaging findings of potential clinical significance: Aortic Atherosclerosis (ICD10-I70.0). Coronary atherosclerosis. Stable 9 mm hypodense lesion in the lateral segment left hepatic lobe. Duplicated left renal collecting system.     Impression and Plan:   72 year old gentleman with the following issues:  1.  Metastatic adenocarcinoma that is poorly differentiated.  He presented with omental carcinomatosis on a CT scan on 03/01/2016.   He is S/P Taxotere salvage chemotherapy started on 03/30/2016. He received 9 cycles of therapy with CT scan on 09/27/2016 showed progression of disease.   He underwent a biopsy of his peritoneal implants on 10/12/2016. The final pathology revealed a poorly differentiated tumor with neuroendocrine features.  He completed 6 cycles of carboplatin and etoposide with excellent response to therapy.   CT scan obtained on May 31, 2017 was personally reviewed and showed no evidence of recurrent  disease.  His right side pleural effusion could be malignant in nature but could be treatment related effect.  The plan is to continue with active surveillance at this time and reinstitute systemic therapy if he has clear-cut metastasis noted.  He will have a repeat CT scan scheduled in 2 months.   2.  Pneumohydrothorax: This is an incidental finding without any recent instrumentation or trauma.  The natural course of this finding was discussed with the patient.  Intervention options were reviewed which include catheter insertion at this time.  He is asymptomatic at this time and we have elected to monitor this closely.  I will repeat a chest x-ray next week and consider referral to interventional radiology if his hydropneumothorax is expanding.  He was also given clear instructions to  report to emergency department if he develops symptoms of shortness of breath, cough or tightness.   3. IV access: Port-A-Cath will be flushed in 8 weeks.  I have recommended continuing this catheter in place for the time being.  4. Lower extremity edema: Related to malignancy and has resolved at this time.   5. Anemia: Related to malignancy and chemotherapy. His hemoglobin continues to improve and does not require any intervention.  6. Follow-up: Will be in 8 weeks to repeat imaging studies.  15  minutes was spent with the patient face-to-face today.  More than 50% of time was dedicated to patient counseling, education and coordination of his multifaceted care.   Zola Button, MD 1/25/20192:57 PM

## 2017-06-07 ENCOUNTER — Ambulatory Visit (HOSPITAL_COMMUNITY)
Admission: RE | Admit: 2017-06-07 | Discharge: 2017-06-07 | Disposition: A | Discharge status: Home / Self Care | Payer: PPO | Source: Ambulatory Visit | Attending: Oncology | Admitting: Oncology

## 2017-06-07 DIAGNOSIS — J90 Pleural effusion, not elsewhere classified: Secondary | ICD-10-CM | POA: Diagnosis not present

## 2017-06-07 DIAGNOSIS — C61 Malignant neoplasm of prostate: Secondary | ICD-10-CM | POA: Insufficient documentation

## 2017-06-08 ENCOUNTER — Encounter: Payer: Self-pay | Admitting: *Deleted

## 2017-06-15 DIAGNOSIS — Z23 Encounter for immunization: Secondary | ICD-10-CM | POA: Diagnosis not present

## 2017-06-15 DIAGNOSIS — D1800 Hemangioma unspecified site: Secondary | ICD-10-CM | POA: Diagnosis not present

## 2017-06-15 DIAGNOSIS — D225 Melanocytic nevi of trunk: Secondary | ICD-10-CM | POA: Diagnosis not present

## 2017-06-15 DIAGNOSIS — D2271 Melanocytic nevi of right lower limb, including hip: Secondary | ICD-10-CM | POA: Diagnosis not present

## 2017-06-15 DIAGNOSIS — L814 Other melanin hyperpigmentation: Secondary | ICD-10-CM | POA: Diagnosis not present

## 2017-06-15 DIAGNOSIS — L299 Pruritus, unspecified: Secondary | ICD-10-CM | POA: Diagnosis not present

## 2017-06-15 DIAGNOSIS — L821 Other seborrheic keratosis: Secondary | ICD-10-CM | POA: Diagnosis not present

## 2017-06-15 DIAGNOSIS — Z85820 Personal history of malignant melanoma of skin: Secondary | ICD-10-CM | POA: Diagnosis not present

## 2017-06-16 DIAGNOSIS — H903 Sensorineural hearing loss, bilateral: Secondary | ICD-10-CM | POA: Diagnosis not present

## 2017-06-22 ENCOUNTER — Encounter: Payer: Self-pay | Admitting: Oncology

## 2017-06-27 ENCOUNTER — Other Ambulatory Visit: Payer: Self-pay | Admitting: Oncology

## 2017-06-27 DIAGNOSIS — J939 Pneumothorax, unspecified: Secondary | ICD-10-CM

## 2017-06-27 DIAGNOSIS — R109 Unspecified abdominal pain: Secondary | ICD-10-CM

## 2017-06-27 DIAGNOSIS — C61 Malignant neoplasm of prostate: Secondary | ICD-10-CM

## 2017-06-29 ENCOUNTER — Other Ambulatory Visit: Payer: Self-pay | Admitting: *Deleted

## 2017-06-29 ENCOUNTER — Ambulatory Visit (HOSPITAL_COMMUNITY)
Admission: RE | Admit: 2017-06-29 | Discharge: 2017-06-29 | Disposition: A | Discharge status: Home / Self Care | Payer: PPO | Source: Ambulatory Visit | Attending: Oncology | Admitting: Oncology

## 2017-06-29 ENCOUNTER — Encounter (HOSPITAL_COMMUNITY): Payer: Self-pay

## 2017-06-29 ENCOUNTER — Encounter: Payer: Self-pay | Admitting: Oncology

## 2017-06-29 DIAGNOSIS — C61 Malignant neoplasm of prostate: Secondary | ICD-10-CM

## 2017-06-29 DIAGNOSIS — J90 Pleural effusion, not elsewhere classified: Secondary | ICD-10-CM | POA: Insufficient documentation

## 2017-06-29 DIAGNOSIS — E041 Nontoxic single thyroid nodule: Secondary | ICD-10-CM | POA: Diagnosis not present

## 2017-06-29 DIAGNOSIS — J9811 Atelectasis: Secondary | ICD-10-CM | POA: Insufficient documentation

## 2017-06-29 DIAGNOSIS — C7951 Secondary malignant neoplasm of bone: Secondary | ICD-10-CM | POA: Diagnosis not present

## 2017-06-29 DIAGNOSIS — R180 Malignant ascites: Secondary | ICD-10-CM | POA: Diagnosis not present

## 2017-06-29 DIAGNOSIS — R079 Chest pain, unspecified: Secondary | ICD-10-CM | POA: Diagnosis not present

## 2017-06-29 DIAGNOSIS — J939 Pneumothorax, unspecified: Secondary | ICD-10-CM

## 2017-06-29 DIAGNOSIS — R109 Unspecified abdominal pain: Secondary | ICD-10-CM

## 2017-06-29 DIAGNOSIS — I2510 Atherosclerotic heart disease of native coronary artery without angina pectoris: Secondary | ICD-10-CM | POA: Diagnosis not present

## 2017-06-29 DIAGNOSIS — I700 Atherosclerosis of aorta: Secondary | ICD-10-CM | POA: Diagnosis not present

## 2017-06-29 MED ORDER — HEPARIN SOD (PORK) LOCK FLUSH 100 UNIT/ML IV SOLN
INTRAVENOUS | Status: AC
  Administered 2017-06-29: 500 [IU]
  Filled 2017-06-29: qty 5

## 2017-06-29 MED ORDER — IOPAMIDOL (ISOVUE-300) INJECTION 61%
100.0000 mL | Freq: Once | INTRAVENOUS | Status: AC | PRN
  Administered 2017-06-29: 100 mL via INTRAVENOUS

## 2017-06-29 MED ORDER — HEPARIN SOD (PORK) LOCK FLUSH 100 UNIT/ML IV SOLN
500.0000 [IU] | Freq: Once | INTRAVENOUS | Status: AC
  Administered 2017-06-29: 500 [IU]

## 2017-06-29 MED ORDER — IOPAMIDOL (ISOVUE-300) INJECTION 61%
INTRAVENOUS | Status: AC
  Administered 2017-06-29: 100 mL via INTRAVENOUS
  Filled 2017-06-29: qty 100

## 2017-06-29 NOTE — Progress Notes (Signed)
The results of the CT scan and chest x-ray were personally reviewed and discussed with Frank Larsen over the phone today.  His pain appears to be not dramatically changed and manageable by ibuprofen.  Imaging studies did not really show any clear-cut pathology or disease progression.  I have recommended continue supportive management as he is doing and he will update me of any clinical status changes.  He will keep his appointment in March 2019 as scheduled.  He will not have to repeat a CT scan in March unless new symptoms arise.

## 2017-06-30 ENCOUNTER — Other Ambulatory Visit: Payer: Self-pay | Admitting: Internal Medicine

## 2017-07-06 ENCOUNTER — Telehealth: Payer: Self-pay | Admitting: Internal Medicine

## 2017-07-06 NOTE — Telephone Encounter (Signed)
[place] told pt that Dr Cruzita Lederer did not approve the Metformin refill. Pt is confused as to why. He saw Dr. Cruzita Lederer recently and this was not discussed. Please call pt at ph# [phone] to advise

## 2017-07-07 NOTE — Telephone Encounter (Signed)
Pt is aware that this was approved on 06/30/17 and we have sent no denials. He is calling his pharmacy back to discuss further

## 2017-07-21 ENCOUNTER — Other Ambulatory Visit: Payer: Self-pay | Admitting: Oncology

## 2017-07-21 MED ORDER — HYDROXYZINE HCL 10 MG PO TABS: 10.0000 mg | ORAL_TABLET | Freq: Four times a day (QID) | ORAL | 0 refills | Status: DC | PRN

## 2017-07-24 ENCOUNTER — Encounter: Payer: Self-pay | Admitting: Oncology

## 2017-07-24 NOTE — Progress Notes (Signed)
Submitted auth request for Hydroxyzine today.  Status is pending.  °

## 2017-07-25 ENCOUNTER — Inpatient Hospital Stay: Payer: PPO

## 2017-07-25 ENCOUNTER — Inpatient Hospital Stay: Payer: PPO | Attending: Oncology

## 2017-07-25 DIAGNOSIS — Z7984 Long term (current) use of oral hypoglycemic drugs: Secondary | ICD-10-CM | POA: Insufficient documentation

## 2017-07-25 DIAGNOSIS — Z95828 Presence of other vascular implants and grafts: Secondary | ICD-10-CM

## 2017-07-25 DIAGNOSIS — Z9221 Personal history of antineoplastic chemotherapy: Secondary | ICD-10-CM | POA: Insufficient documentation

## 2017-07-25 DIAGNOSIS — Z452 Encounter for adjustment and management of vascular access device: Secondary | ICD-10-CM | POA: Diagnosis not present

## 2017-07-25 DIAGNOSIS — Z79899 Other long term (current) drug therapy: Secondary | ICD-10-CM | POA: Insufficient documentation

## 2017-07-25 DIAGNOSIS — C61 Malignant neoplasm of prostate: Secondary | ICD-10-CM | POA: Insufficient documentation

## 2017-07-25 DIAGNOSIS — Z881 Allergy status to other antibiotic agents status: Secondary | ICD-10-CM | POA: Insufficient documentation

## 2017-07-25 DIAGNOSIS — C7951 Secondary malignant neoplasm of bone: Secondary | ICD-10-CM | POA: Diagnosis not present

## 2017-07-25 DIAGNOSIS — D649 Anemia, unspecified: Secondary | ICD-10-CM | POA: Diagnosis not present

## 2017-07-25 DIAGNOSIS — Z8546 Personal history of malignant neoplasm of prostate: Secondary | ICD-10-CM | POA: Diagnosis present

## 2017-07-25 LAB — CBC WITH DIFFERENTIAL (CANCER CENTER ONLY)
BASOS ABS: 0 10*3/uL (ref 0.0–0.1)
BASOS PCT: 1 %
Eosinophils Absolute: 0.2 10*3/uL (ref 0.0–0.5)
Eosinophils Relative: 4 %
HEMATOCRIT: 34.2 % — AB (ref 38.4–49.9)
HEMOGLOBIN: 11.1 g/dL — AB (ref 13.0–17.1)
LYMPHS PCT: 11 %
Lymphs Abs: 0.6 10*3/uL — ABNORMAL LOW (ref 0.9–3.3)
MCH: 28.8 pg (ref 27.2–33.4)
MCHC: 32.5 g/dL (ref 32.0–36.0)
MCV: 88.5 fL (ref 79.3–98.0)
Monocytes Absolute: 0.7 10*3/uL (ref 0.1–0.9)
Monocytes Relative: 12 %
NEUTROS ABS: 4.1 10*3/uL (ref 1.5–6.5)
NEUTROS PCT: 72 %
Platelet Count: 240 10*3/uL (ref 140–400)
RBC: 3.87 MIL/uL — AB (ref 4.20–5.82)
RDW: 14.1 % (ref 11.0–14.6)
WBC: 5.7 10*3/uL (ref 4.0–10.3)

## 2017-07-25 LAB — CMP (CANCER CENTER ONLY)
ALBUMIN: 3.7 g/dL (ref 3.5–5.0)
ALT: 21 U/L (ref 0–55)
AST: 18 U/L (ref 5–34)
Alkaline Phosphatase: 54 U/L (ref 40–150)
Anion gap: 10 (ref 3–11)
BILIRUBIN TOTAL: 0.4 mg/dL (ref 0.2–1.2)
BUN: 19 mg/dL (ref 7–26)
CO2: 25 mmol/L (ref 22–29)
CREATININE: 0.92 mg/dL (ref 0.70–1.30)
Calcium: 9.6 mg/dL (ref 8.4–10.4)
Chloride: 104 mmol/L (ref 98–109)
GFR, Est AFR Am: >60 mL/min (ref >60)
Glucose, Bld: 176 mg/dL — ABNORMAL HIGH (ref 70–140)
POTASSIUM: 4 mmol/L (ref 3.5–5.1)
Sodium: 139 mmol/L (ref 136–145)
TOTAL PROTEIN: 7 g/dL (ref 6.4–8.3)

## 2017-07-25 MED ORDER — SODIUM CHLORIDE 0.9% FLUSH
10.0000 mL | INTRAVENOUS | Status: DC | PRN
  Administered 2017-07-25: 10 mL via INTRAVENOUS
  Filled 2017-07-25: qty 10

## 2017-07-25 MED ORDER — HEPARIN SOD (PORK) LOCK FLUSH 100 UNIT/ML IV SOLN
500.0000 [IU] | Freq: Once | INTRAVENOUS | Status: AC | PRN
  Administered 2017-07-25: 500 [IU] via INTRAVENOUS
  Filled 2017-07-25: qty 5

## 2017-07-26 ENCOUNTER — Encounter: Payer: Self-pay | Admitting: Oncology

## 2017-07-26 NOTE — Progress Notes (Signed)
Pt's Hydroxyzine was approved from 07/25/17 to 05/08/18.

## 2017-07-27 ENCOUNTER — Inpatient Hospital Stay (HOSPITAL_BASED_OUTPATIENT_CLINIC_OR_DEPARTMENT_OTHER): Payer: PPO | Admitting: Oncology

## 2017-07-27 ENCOUNTER — Telehealth: Payer: Self-pay | Admitting: Oncology

## 2017-07-27 VITALS — BP 155/68 | HR 98 | Temp 98.3°F | Resp 18 | Ht 70.5 in | Wt 195.0 lb

## 2017-07-27 DIAGNOSIS — D649 Anemia, unspecified: Secondary | ICD-10-CM | POA: Diagnosis not present

## 2017-07-27 DIAGNOSIS — Z881 Allergy status to other antibiotic agents status: Secondary | ICD-10-CM | POA: Diagnosis not present

## 2017-07-27 DIAGNOSIS — E041 Nontoxic single thyroid nodule: Secondary | ICD-10-CM

## 2017-07-27 DIAGNOSIS — C7951 Secondary malignant neoplasm of bone: Secondary | ICD-10-CM

## 2017-07-27 DIAGNOSIS — Z9221 Personal history of antineoplastic chemotherapy: Secondary | ICD-10-CM

## 2017-07-27 DIAGNOSIS — Z79899 Other long term (current) drug therapy: Secondary | ICD-10-CM | POA: Diagnosis not present

## 2017-07-27 DIAGNOSIS — C61 Malignant neoplasm of prostate: Secondary | ICD-10-CM

## 2017-07-27 DIAGNOSIS — Z7984 Long term (current) use of oral hypoglycemic drugs: Secondary | ICD-10-CM

## 2017-07-27 NOTE — Telephone Encounter (Signed)
Scheduled appt per 3/21 los - Gave patient AVS and calender per [facility] radiology to contact patient with ct schedule.

## 2017-07-27 NOTE — Progress Notes (Signed)
Hematology and Oncology Follow Up Visit  Frank Larsen [id number] 1946-01-12 72 y.o. 07/27/2017 2:44 PM [name], [name], MD   Principle Diagnosis: 72 year old man with advanced prostate cancer with neuroendocrine differentiation.  He was initially diagnosed in 2013 and developed peritoneal carcinomatosis biopsy proven to be adenocarcinoma in 2017.   Prior Therapy:  He is status post robotic-assisted laparoscopic radical prostatectomy and extended lymphadenectomy with the pathology reveals T2N0.  completed in June 2013. Taxotere chemotherapy at 75 mg/m every 3 weeks started on 03/30/2016. He is status post 9 cycles of therapy.  He developed relapsed disease and a biopsy proven to show neuroendocrine poorly differentiated tumor in June 2018. He is S/P Carboplatin and etoposide. Cycle 1 given on 10/24/2016. He completed cycle 6 on February 13, 2017 with near complete response.  Current therapy: Observation and surveillance.  Interim History: Mr. is here for a follow-up.  Since her last visit, he reported no major changes in his health.  He continues to enjoy a reasonable quality of life and performance status.  He does report chronic abdominal discomfort that has been relatively mild and manageable with ibuprofen at times.  His performance status and activity level is back to baseline.  He is eating well and maintaining his weight.  He denies any lower extremity edema or abdominal distention.  He denies any shortness of breath or difficulty breathing.   He does not report any headaches, blurry vision, syncope or seizures. He does not report any fevers, chills, sweats. He does not report any chest pain, palpitation, orthopnea or leg edema. He does not report any cough, wheezing or hemoptysis. He does not report any hematochezia, melena or diarrhea at this time.[name] He does not report any frequency, urgency or hematuria. He does not report any skeletal complaints.  He denied any heat or  cold intolerance.  He does not report any skin rashes or lesions.  Remaining review of systems is negative.   Medications: I have reviewed the patient's current medications.  Current Outpatient Medications  Medication Sig Dispense Refill  . atorvastatin (LIPITOR) 10 MG tablet Take 10 mg by mouth daily with breakfast.     . Blood Glucose Monitoring Suppl (ONE TOUCH ULTRA 2) w/Device KIT Use to check blood sugar 2 times per day. 1 each 2  . furosemide (LASIX) 20 MG tablet TAKE 1 TABLET BY MOUTH EVERY DAY (Patient not taking: Reported on 06/01/2017) 90 tablet 0  . glipiZIDE (GLUCOTROL XL) 5 MG 24 hr tablet Take 1 tablet (5 mg total) by mouth daily with breakfast. 90 tablet 3  . glipiZIDE (GLUCOTROL XL) 5 MG 24 hr tablet Take 1 tablet by mouth every morning with breakfast 90 tablet 1  . glucose blood (ONE TOUCH ULTRA TEST) test strip Use to check blood sugar 2 times per day. 200 each 5  . hydrOXYzine (ATARAX/VISTARIL) 10 MG tablet Take 1 tablet (10 mg total) by mouth every 6 (six) hours as needed. 60 tablet 0  . lidocaine-prilocaine (EMLA) cream Apply 1 application topically as needed. Apply to port before chemotherapy. 30 g 0  . metFORMIN (GLUCOPHAGE) 1000 MG tablet Take 1 tablet (1,000 mg total) by mouth 2 (two) times daily with a meal. 180 tablet 3  . metFORMIN (GLUCOPHAGE) 1000 MG tablet Take 1 tablet by mouth twice a day with meals 180 tablet 2  . Multiple Vitamin (MULTIVITAMIN) tablet Take 1 tablet by mouth daily.    [name] omeprazole (PRILOSEC) 20 MG capsule Take 20 mg by mouth  every morning.    [name] DELICA LANCETS 08M MISC Use to check blood sugar 2 times per day. 200 each 2  . prochlorperazine (COMPAZINE) 10 MG tablet Take 1 tablet (10 mg total) by mouth every 6 (six) hours as needed for nausea or vomiting. 30 tablet 0  . pseudoephedrine-acetaminophen (TYLENOL SINUS) 30-500 MG TABS tablet Take 1 tablet by mouth every 4 (four) hours as needed (congestion).      No current  facility-administered medications for this visit.      Allergies:  Allergies  Allergen Reactions  . Scallops [Shellfish Allergy] Nausea And Vomiting  . Cephalexin Hives and Rash    Past Medical History, Surgical history, Social history, and Family History updated and remain unchanged.   Physical Exam: Blood pressure (!) 155/68, pulse 98, temperature 98.3 F (36.8 C), temperature source Oral, resp. rate 18, height 5' 10.5" (1.791 m), weight 195 lb (88.5 kg), SpO2 98 %.   ECOG: 1 General appearance: Alert, awake gentleman appeared without distress. Head: Without abnormalities. Oral mucosa: Moist membranes without thrush or ulcers. Lymph nodes: Cervical, supraclavicular, and axillary nodes normal. Heart: Regular rate and rhythm without murmurs or gallops. Lung: Clear to auscultation without any rhonchi or wheezes.  No dullness to percussion. Abdomin: Soft, nontender without any rebound or guarding. Musculoskeletal: Full range of motion noted without any joint deformity or effusion. Skin: No rash or lesions  Lab Results: Lab Results  Component Value Date   WBC 5.7 07/25/2017   HGB 11.7 (L) 05/31/2017   HCT 34.2 (L) 07/25/2017   MCV 88.5 07/25/2017   PLT 240 07/25/2017     Chemistry      Component Value Date/Time   NA 139 07/25/2017 0807   NA 140 107-Feb-202018 1136   K 4.0 07/25/2017 0807   K 3.9 107-Feb-202018 1136   CL 104 07/25/2017 0807   CO2 25 07/25/2017 0807   CO2 25 107-Feb-202018 1136   BUN 19 07/25/2017 0807   BUN 15.8 107-Feb-202018 1136   CREATININE 0.92 07/25/2017 0807   CREATININE 0.9 107-Feb-202018 1136   GLU 155 08/24/2015      Component Value Date/Time   CALCIUM 9.6 07/25/2017 0807   CALCIUM 9.2 107-Feb-202018 1136   ALKPHOS 54 07/25/2017 0807   ALKPHOS 55 107-Feb-202018 1136   AST 18 07/25/2017 0807   AST 19 107-Feb-202018 1136   ALT 21 07/25/2017 0807   ALT 15 107-Feb-202018 1136   BILITOT 0.4 07/25/2017 0807   BILITOT 0.32 107-Feb-202018 1136     EXAM: CT CHEST, ABDOMEN,  AND PELVIS WITH CONTRAST  TECHNIQUE: Multidetector CT imaging of the chest, abdomen and pelvis was performed following the standard protocol during bolus administration of intravenous contrast.  CONTRAST:  I think little key patent for awhile is 6 they pay 35 duct we probably pain around 30 dollars a month are of stance  COMPARISON:  05/31/2017  FINDINGS: CT CHEST FINDINGS  Cardiovascular: The heart size appears normal. Aortic atherosclerosis noted. Calcification within the RCA, LAD and left circumflex coronary arteries noted.  Mediastinum/Nodes: 1.8 cm nodule identified in right lobe of thyroid gland, image 7 of series 5. The trachea appears patent and is midline. No mediastinal or hilar adenopathy. No supraclavicular or axillary adenopathy.  Lungs/Pleura: Unchanged moderate right pleural effusion. Scar versus atelectasis noted within both lower lobes. No suspicious pulmonary nodules. Calcified granuloma identified in the right lower lobe.  Musculoskeletal: Sclerotic metastatic lesion involving the right eighth rib is again noted, image 110 of series 4.  CT ABDOMEN PELVIS FINDINGS  Hepatobiliary: Unchanged 8 mm hypodense lesion in the lateral segment of left lobe of liver, image 54 of series 5. The gallbladder appears normal. No biliary dilatation.  Pancreas: Normal appearance of the pancreas.  Spleen: Spleen negative.  Adrenals/Urinary Tract: The adrenal glands are unremarkable. Normal appearance of both kidneys. No mass or hydronephrosis identified. The urinary bladder is unremarkable.  Stomach/Bowel: Normal appearance of the stomach. No pathologic dilatation of the small or large bowel loops.  Vascular/Lymphatic: Aortic atherosclerosis. No aneurysm. Multiple small abdominal lymph nodes are identified which appear unchanged from previous exam. No adenopathy.  Reproductive: Previous prostatectomy.  Other: There is mild upper abdominal ascites.  This is similar in volume to the previous exam. Soft tissue nodule along the right pericolic gutter appears new measuring 5 mm, image 84 of series 5.  Musculoskeletal: Degenerative disc disease identified within the lumbar spine. Sclerotic lesion within the right iliac bone measures 6 mm and is unchanged from previous exam.  IMPRESSION: 1. Stable appearance of sclerotic bone metastasis involving the right eighth rib and right iliac bone. 2. Small volume of ascites within the upper abdomen appears similar to previous exam. New from the previous study is a tiny soft tissue attenuating nodule along the right pericolic gutter that measures approximately 5 mm. 3. Unchanged moderate right pleural effusion. Resolution of previous pneumothorax. 4. Incidentally noted is a right lobe of thyroid gland nodule measuring 1.8 cm. Consider further evaluation with thyroid ultrasound. If patient is clinically hyperthyroid, consider nuclear medicine thyroid uptake and scan. 5. Aortic atherosclerosis and 3 vessel coronary artery atherosclerotic calcifications. Aortic Atherosclerosis (ICD10-I70.0).    Impression and Plan:   72 year old gentleman with the following issues:  1.  Advanced prostate cancer with neuroendocrine features with peritoneal involvement.  He is status post systemic chemotherapy outlined above.  CT scan obtained on June 29, 2017 was reviewed today and continues to show no evidence of worsening disease.  I see no indication for restarting systemic therapy.  The plan is to continue with active surveillance and repeat imaging studies in 3 months.   2.  Pneumohydrothorax: Resolved at this time based on recent CT scan.  He continues to have pleural effusion although has not expanded.  Continue to ask him to monitor any respiratory symptoms and thoracentesis can be done if he develops any symptoms or worsening of his fusion.   3. IV access: Port-A-Cath remains in place and  will be flushed periodically.  4.  Edema: Resolved at this time and likely related to malignancy.   5. Anemia: Hemoglobin appears to be stable at this time.  No active bleeding noted.  6.  Thyroid nodule: Will be monitored on future scans.  Will check thyroid function with the next visit.  7. Follow-up: Will be in 3 months after repeat imaging studies.  15  minutes was spent with the patient face-to-face today.  More than 50% of time was dedicated to patient counseling, education and answering question regarding his future plans of care.Zola Button, MD 3/21/20192:44 PM

## 2017-08-23 ENCOUNTER — Other Ambulatory Visit: Payer: Self-pay | Admitting: Oncology

## 2017-09-04 ENCOUNTER — Other Ambulatory Visit: Payer: Self-pay | Admitting: Oncology

## 2017-09-07 ENCOUNTER — Inpatient Hospital Stay: Payer: PPO

## 2017-09-07 ENCOUNTER — Inpatient Hospital Stay: Payer: PPO | Attending: Oncology

## 2017-09-07 VITALS — BP 158/58 | HR 64 | Temp 97.9°F | Resp 17

## 2017-09-07 DIAGNOSIS — Z452 Encounter for adjustment and management of vascular access device: Secondary | ICD-10-CM | POA: Insufficient documentation

## 2017-09-07 DIAGNOSIS — R634 Abnormal weight loss: Secondary | ICD-10-CM | POA: Diagnosis not present

## 2017-09-07 DIAGNOSIS — C61 Malignant neoplasm of prostate: Secondary | ICD-10-CM

## 2017-09-07 DIAGNOSIS — E041 Nontoxic single thyroid nodule: Secondary | ICD-10-CM

## 2017-09-07 DIAGNOSIS — Z8546 Personal history of malignant neoplasm of prostate: Secondary | ICD-10-CM | POA: Diagnosis not present

## 2017-09-07 DIAGNOSIS — Z95828 Presence of other vascular implants and grafts: Secondary | ICD-10-CM

## 2017-09-07 LAB — CBC WITH DIFFERENTIAL/PLATELET
BASOS ABS: 0 10*3/uL (ref 0.0–0.1)
Basophils Relative: 0 %
EOS ABS: 0.3 10*3/uL (ref 0.0–0.5)
EOS PCT: 5 %
HCT: 34.2 % — ABNORMAL LOW (ref 38.4–49.9)
Hemoglobin: 10.9 g/dL — ABNORMAL LOW (ref 13.0–17.1)
Lymphocytes Relative: 20 %
Lymphs Abs: 1.1 10*3/uL (ref 0.9–3.3)
MCH: 28.9 pg (ref 27.2–33.4)
MCHC: 31.9 g/dL — ABNORMAL LOW (ref 32.0–36.0)
MCV: 90.7 fL (ref 79.3–98.0)
MONO ABS: 0.6 10*3/uL (ref 0.1–0.9)
Monocytes Relative: 11 %
Neutro Abs: 3.4 10*3/uL (ref 1.5–6.5)
Neutrophils Relative %: 64 %
PLATELETS: 269 10*3/uL (ref 140–400)
RBC: 3.77 MIL/uL — AB (ref 4.20–5.82)
RDW: 14 % (ref 11.0–14.6)
WBC: 5.2 10*3/uL (ref 4.0–10.3)

## 2017-09-07 LAB — COMPREHENSIVE METABOLIC PANEL
ALBUMIN: 3.8 g/dL (ref 3.5–5.0)
ALK PHOS: 57 U/L (ref 40–150)
ALT: 18 U/L (ref 0–55)
AST: 18 U/L (ref 5–34)
Anion gap: 8 (ref 3–11)
BILIRUBIN TOTAL: 0.2 mg/dL (ref 0.2–1.2)
BUN: 18 mg/dL (ref 7–26)
CALCIUM: 9.9 mg/dL (ref 8.4–10.4)
CO2: 26 mmol/L (ref 22–29)
CREATININE: 0.91 mg/dL (ref 0.70–1.30)
Chloride: 106 mmol/L (ref 98–109)
GFR calc Af Amer: >60 mL/min (ref >60)
GLUCOSE: 155 mg/dL — AB (ref 70–140)
Potassium: 4.3 mmol/L (ref 3.5–5.1)
Sodium: 140 mmol/L (ref 136–145)
TOTAL PROTEIN: 7.1 g/dL (ref 6.4–8.3)

## 2017-09-07 LAB — TSH: TSH: 0.944 u[IU]/mL (ref 0.320–4.118)

## 2017-09-07 MED ORDER — SODIUM CHLORIDE 0.9% FLUSH
10.0000 mL | INTRAVENOUS | Status: DC | PRN
  Administered 2017-09-07: 10 mL via INTRAVENOUS
  Filled 2017-09-07: qty 10

## 2017-09-07 MED ORDER — HEPARIN SOD (PORK) LOCK FLUSH 100 UNIT/ML IV SOLN
500.0000 [IU] | Freq: Once | INTRAVENOUS | Status: AC | PRN
  Administered 2017-09-07: 500 [IU] via INTRAVENOUS
  Filled 2017-09-07: qty 5

## 2017-09-07 NOTE — Patient Instructions (Signed)
Implanted Port Home Guide An implanted port is a type of central line that is placed under the skin. Central lines are used to provide IV access when treatment or nutrition needs to be given through a person's veins. Implanted ports are used for long-term IV access. An implanted port may be placed because:  You need IV medicine that would be irritating to the small veins in your hands or arms.  You need long-term IV medicines, such as antibiotics.  You need IV nutrition for a long period.  You need frequent blood draws for lab tests.  You need dialysis.  Implanted ports are usually placed in the chest area, but they can also be placed in the upper arm, the abdomen, or the leg. An implanted port has two main parts:  Reservoir. The reservoir is round and will appear as a small, raised area under your skin. The reservoir is the part where a needle is inserted to give medicines or draw blood.  Catheter. The catheter is a thin, flexible tube that extends from the reservoir. The catheter is placed into a large vein. Medicine that is inserted into the reservoir goes into the catheter and then into the vein.  How will I care for my incision site? Do not get the incision site wet. Bathe or shower as directed by your health care provider. How is my port accessed? Special steps must be taken to access the port:  Before the port is accessed, a numbing cream can be placed on the skin. This helps numb the skin over the port site.  Your health care provider uses a sterile technique to access the port. ? Your health care provider must put on a mask and sterile gloves. ? The skin over your port is cleaned carefully with an antiseptic and allowed to dry. ? The port is gently pinched between sterile gloves, and a needle is inserted into the port.  Only "non-coring" port needles should be used to access the port. Once the port is accessed, a blood return should be checked. This helps ensure that the port  is in the vein and is not clogged.  If your port needs to remain accessed for a constant infusion, a clear (transparent) bandage will be placed over the needle site. The bandage and needle will need to be changed every week, or as directed by your health care provider.  Keep the bandage covering the needle clean and dry. Do not get it wet. Follow your health care provider's instructions on how to take a shower or bath while the port is accessed.  If your port does not need to stay accessed, no bandage is needed over the port.  What is flushing? Flushing helps keep the port from getting clogged. Follow your health care provider's instructions on how and when to flush the port. Ports are usually flushed with saline solution or a medicine called heparin. The need for flushing will depend on how the port is used.  If the port is used for intermittent medicines or blood draws, the port will need to be flushed: ? After medicines have been given. ? After blood has been drawn. ? As part of routine maintenance.  If a constant infusion is running, the port may not need to be flushed.  How long will my port stay implanted? The port can stay in for as long as your health care provider thinks it is needed. When it is time for the port to come out, surgery will be   done to remove it. The procedure is similar to the one performed when the port was put in. When should I seek immediate medical care? When you have an implanted port, you should seek immediate medical care if:  You notice a bad smell coming from the incision site.  You have swelling, redness, or drainage at the incision site.  You have more swelling or pain at the port site or the surrounding area.  You have a fever that is not controlled with medicine.  This information is not intended to replace advice given to you by your health care provider. Make sure you discuss any questions you have with your health care provider. Document  Released: 04/25/2005 Document Revised: 10/01/2015 Document Reviewed: 12/31/2012 Elsevier Interactive Patient Education  2017 [employer].  

## 2017-09-22 ENCOUNTER — Other Ambulatory Visit: Payer: Self-pay | Admitting: Oncology

## 2017-09-29 ENCOUNTER — Encounter: Payer: Self-pay | Admitting: Internal Medicine

## 2017-09-29 ENCOUNTER — Ambulatory Visit: Payer: PPO | Admitting: Internal Medicine

## 2017-09-29 VITALS — BP 126/74 | HR 72 | Ht 70.5 in | Wt 188.2 lb

## 2017-09-29 DIAGNOSIS — E1140 Type 2 diabetes mellitus with diabetic neuropathy, unspecified: Secondary | ICD-10-CM

## 2017-09-29 DIAGNOSIS — E7800 Pure hypercholesterolemia, unspecified: Secondary | ICD-10-CM

## 2017-09-29 LAB — POCT GLYCOSYLATED HEMOGLOBIN (HGB A1C): Hemoglobin A1C: 7.6 % — AB (ref 4.0–5.6)

## 2017-09-29 MED ORDER — SITAGLIPTIN PHOSPHATE 100 MG PO TABS: 100.0000 mg | ORAL_TABLET | Freq: Every day | ORAL | 3 refills | Status: DC

## 2017-09-29 NOTE — Progress Notes (Signed)
Patient ID: Frank Larsen, male   DOB: Sep 14, 1945, 72 y.o.   MRN: [id number]  HPI: Frank Larsen is a 72 y.o.-year-old male, returning for f/u for DM2, dx in 2013, non-insulin-dependent, fairly well controlled, with complications (PN). Last visit 4 mo ago.  He has metastatic prostate cancer (diagnosed in 2013).  He was initially on a chemotherapy which did not work, then started chemotherapy + Neulasta >> stopped 02/2017.  He was on dexamethasone with the treatments.  He had radiation treatments in 2015.  He had 2 CT scans since last visit >> cancer is still in remission, however, he has more abdominal pain now so he is wondering whether it has recurred. He has another CT scan in June.  He just returned from [place]. Going to [place] in Sept. For their 50th anniversary.  Last hemoglobin A1c was: Lab Results  Component Value Date   HGBA1C 7.2 06/01/2017   HGBA1C 6.8 01/30/2017   HGBA1C 6.1 09/28/2016  02/17/2015: 7.7% 10/2014: 7.6%  Pt is on a regimen of: - Metformin 1000 mg 2x a day, with meals - Glipizide ER 5 mg before b'fast He was on Actos 30 mg daily in a.m. - added in 2016 >> stopped 09/06/2016.  Sugars did not significantly worsen after stopping Actos. Ran out of Januvia 100 mg daily in a.m. >> this was not covered at the end of last year (donut hole).  Pt checks sugars 0-1x a day: - am:  110-173 >> 159-222, 244 >> 176-242 (after coffee) - 2h after b'fast: 163, 166, 236 >> 101-280 >> 142 >> 155-187 - before lunch: 92-199, 263 >> 107-215, 259 >> 68-187, 204 - 2h after lunch: 148, 152 >> 174, 179 >> 101-225 - before dinner:99-142, 177 >> 90-167 >> n/c >> 76-167 - 2h after dinner: 144, 153 >> 123, 168 >> 168 - bedtime: n/c - nighttime: n/c Lowest sugar was 90 >> 68;  he has hypoglycemia awareness in the 70s. Highest sugar was 244 >> 242.  Glucometer: One Touch Ultra.  -No CKD, last BUN/creatinine:  Lab Results  Component Value Date   BUN 18 09/07/2017   BUN 19 07/25/2017   CREATININE 0.91 09/07/2017   CREATININE 0.92 07/25/2017  08/24/2015: 14/1.01, EGFR 73 No results found for: MICRALBCREAT  He has a history of increased microalbumin.  Continues on losartan.  -+ HL; last set of lipids: No results found for: CHOL, HDL, LDLCALC, LDLDIRECT, TRIG, CHOLHDL 08/24/2015: 141/134/41/74  On Lipitor  - last eye exam was 10/2016: reportedly no DR; + Cataract.  - + Numbness and tingling in his feet.   He also has a h/o Melanoma.  He also has HTN.  ROS: Constitutional: no weight gain/no weight loss, + fatigue, no subjective hyperthermia, no subjective hypothermia, + nocturia Eyes: no blurry vision, no xerophthalmia ENT: no sore throat, no nodules palpated in throat, no dysphagia, no odynophagia, no hoarseness Cardiovascular: no CP/no SOB/no palpitations/no leg swelling Respiratory: no cough/no SOB/no wheezing Gastrointestinal: no N/no V/no D/+ C/no acid reflux, + AP Musculoskeletal: no muscle aches/no joint aches Skin: no rashes, no hair loss Neurological: no tremors/+ numbness/+ tingling/no dizziness  I reviewed pt's medications, allergies, PMH, social hx, family hx, and changes were documented in the history of present illness. Otherwise, unchanged from my initial visit note.   Past Medical History:  Diagnosis Date  . AK (actinic keratosis)   . Arthritis   . BPH (benign prostatic hyperplasia)   . Chronic low back pain  since 1979  . Diabetes mellitus ORAL MED   type 2  . Elevated PSA   . GERD (gastroesophageal reflux disease)   . Hearing loss    using bilateral hearing aids  . History of radiation therapy 01/11/14- 03/04/14   prostate bed 6600 cGy 33 sessions  . Hypercholesterolemia   . Hypercholesterolemia   . Hyperlipidemia   . Impaired hearing BILATERAL HEARING AIDS   20% RIGHT HEARING DUE TO VIRUS  . Melanoma ([place])   . Normal cardiac stress test 2010  . Prostate cancer ([place]) 07/20/11   Gleason 9  . Prostate cancer  ([place])   . Prostate nodule   . Skin cancer    malignant melanoma of skin   . Tinnitus    stable since 1992  . Tobacco use    Past Surgical History:  Procedure Laterality Date  . APPENDECTOMY  AGE 74  . CARPOMETACARPAL JOINT ARTHROTOMY  03-22-2006   RIGHT THUMB  . CARPOMETACARPAL JOINT ARTHROTOMY  12-21-2005   LEFT THUMB  . IR GENERIC HISTORICAL  03/22/2016   IR US GUIDE VASC ACCESS RIGHT [facility]  . IR GENERIC HISTORICAL  03/22/2016   IR FLUORO GUIDE PORT INSERTION RIGHT [facility]  . MELANOMA EXCISION  07-12-10   RIGHT NECK  . PROSTATE BIOPSY  07/20/2011   Procedure: BIOPSY TRANSRECTAL ULTRASONIC PROSTATE (TUBP);  Surgeon: Molli Hazard, MD;  Location: [facility];  Service: Urology;  Laterality: N/A;  1 hour requested for this case  Saturation BX  OFFICE TO BRING ULTRASOUND MACHINE    . ROBOT ASSISTED LAPAROSCOPIC RADICAL PROSTATECTOMY  10/19/2011   Procedure: ROBOTIC ASSISTED LAPAROSCOPIC RADICAL PROSTATECTOMY;  Surgeon: Molli Hazard, MD;  Location: [facility];  Service: Urology;  Laterality: N/A;      . SHOULDER OPEN ROTATOR CUFF REPAIR  07-29-2010- RIGHT   AND SUBACROMIAL DECOMPRESSION AROMIOPLASTY  . TONSILLECTOMY  AGE 75   Social History   Social History  . Marital Status: Married    Spouse Name: N/A  . Number of Children: 2   Occupational History  . Retired     Public relations account executive   Social History Main Topics  . Smoking status: Former Smoker -- 1.00 packs/day for 43 years    Types: Cigarettes    Quit date: 12/07/2007  . Smokeless tobacco: Never Used  . Alcohol Use: Yes     Comment: OCCASIONAL  . Drug Use: No   Current Outpatient Medications on File Prior to Visit  Medication Sig Dispense Refill  . atorvastatin (LIPITOR) 10 MG tablet Take 10 mg by mouth daily with breakfast.     . Blood Glucose Monitoring Suppl (ONE TOUCH ULTRA 2) w/Device KIT Use to check blood sugar 2 times per day. 1 each 2  . furosemide (LASIX) 20 MG  tablet TAKE 1 TABLET BY MOUTH EVERY DAY (Patient not taking: Reported on 06/01/2017) 90 tablet 0  . glipiZIDE (GLUCOTROL XL) 5 MG 24 hr tablet Take 1 tablet (5 mg total) by mouth daily with breakfast. 90 tablet 3  . glipiZIDE (GLUCOTROL XL) 5 MG 24 hr tablet Take 1 tablet by mouth every morning with breakfast 90 tablet 1  . glucose blood (ONE TOUCH ULTRA TEST) test strip Use to check blood sugar 2 times per day. 200 each 5  . hydrOXYzine (ATARAX/VISTARIL) 10 MG tablet TAKE 1 TABLET BY MOUTH EVERY 6 HOURS AS NEEDED 60 tablet 0  . lidocaine-prilocaine (EMLA) cream Apply 1 application topically as needed. Apply to port  before chemotherapy. 30 g 0  . metFORMIN (GLUCOPHAGE) 1000 MG tablet Take 1 tablet (1,000 mg total) by mouth 2 (two) times daily with a meal. 180 tablet 3  . metFORMIN (GLUCOPHAGE) 1000 MG tablet Take 1 tablet by mouth twice a day with meals 180 tablet 2  . Multiple Vitamin (MULTIVITAMIN) tablet Take 1 tablet by mouth daily.    [name] omeprazole (PRILOSEC) 20 MG capsule Take 20 mg by mouth every morning.    [name] DELICA LANCETS 28J MISC Use to check blood sugar 2 times per day. 200 each 2  . prochlorperazine (COMPAZINE) 10 MG tablet Take 1 tablet (10 mg total) by mouth every 6 (six) hours as needed for nausea or vomiting. 30 tablet 0  . pseudoephedrine-acetaminophen (TYLENOL SINUS) 30-500 MG TABS tablet Take 1 tablet by mouth every 4 (four) hours as needed (congestion).      No current facility-administered medications on file prior to visit.    Allergies  Allergen Reactions  . Scallops [Shellfish Allergy] Nausea And Vomiting  . Cephalexin Hives and Rash   Family History  Problem Relation Age of Onset  . Cancer Mother        breast/lived 91 years post  . Alzheimer's disease Mother        deceased  . Cancer Brother        thyroid  . Heart disease Brother   . Stroke Father        43 years old  . Hypertension Father   . Alcoholism Father   . CVA Father   . Cirrhosis  Paternal Grandfather        alcohol-related  . Cancer Paternal Grandmother        unknown type    PE: BP 126/74   Pulse 72   Ht 5' 10.5" (1.791 m)   Wt 188 lb 3.2 oz (85.4 kg)   SpO2 97%   BMI 26.62 kg/m  Wt Readings from Last 3 Encounters:  09/29/17 188 lb 3.2 oz (85.4 kg)  07/27/17 195 lb (88.5 kg)  06/02/17 196 lb 4.8 oz (89 kg)   Constitutional: overweight, in NAD Eyes: PERRLA, EOMI, no exophthalmos ENT: moist mucous membranes, no thyromegaly, no cervical lymphadenopathy Cardiovascular: RRR, No MRG Respiratory: CTA B Gastrointestinal: abdomen soft, NT, ND, BS+ Musculoskeletal: no deformities, strength intact in all 4 Skin: moist, warm, no rashes Neurological: no tremor with outstretched hands, DTR normal in all 4   ASSESSMENT: 1. DM2, non-insulin-dependent, now more controlled, with complications - PN  2. HL  PLAN:  1. Patient with long standing, previously more uncontrolled DM, with better control in last 2 years, but higher sugars esp. After b'fast at last visit possibly 2/2 Dexamethasone he was getting with his ChTx and also 2/2 Holidays. I suggested to add back Januvia but he wanted to work on his diet at that time. - At this visit, sugars are higher, especially in a.m.-and he may have occasional low blood sugars later in the day.  Therefore, we cannot increase his glipizide, which would be his preference.  I again suggested to add Januvia and this time he agrees.  Discussed about taking this before the first meal of the day.  He currently takes glipizide after breakfast and I advised him to move this before the meal.   - If he cannot afford Januvia, we may need to restart Actos - I suggested to:  Patient Instructions  Please continue: - Metformin 1000 mg 2x a day, with meals - Glipizide  ER 5 mg before b'fast  Please start: - Januvia 100 mg before b'fast  Please return in 4 months with your sugar log.  - today, HbA1c is 7.6% (higher) - continue checking  sugars at different times of the day - check 1x a day, rotating checks - advised for yearly eye exams >> he is UTD - Return to clinic in 4 mo with sugar log   2. HL - Reviewed latest lipid panel from 2017: at goal.  He has another lipid panel next month by PCP - Continues the statin without side effects.   Philemon Kingdom, MD PhD [facility] Endocrinology

## 2017-09-29 NOTE — Patient Instructions (Addendum)
Please continue: - Metformin 1000 mg 2x a day, with meals - Glipizide ER 5 mg before b'fast  Please start: - Januvia 100 mg before b'fast  Please return in 4 months with your sugar log.

## 2017-09-30 ENCOUNTER — Encounter: Payer: Self-pay | Admitting: Internal Medicine

## 2017-10-03 ENCOUNTER — Encounter: Payer: Self-pay | Admitting: Oncology

## 2017-10-05 ENCOUNTER — Telehealth: Payer: Self-pay | Admitting: Oncology

## 2017-10-05 NOTE — Telephone Encounter (Signed)
Scheduled appt per 5/30 sch msg - spoke w/ pt re appts.

## 2017-10-06 DIAGNOSIS — R109 Unspecified abdominal pain: Secondary | ICD-10-CM | POA: Diagnosis not present

## 2017-10-06 DIAGNOSIS — C61 Malignant neoplasm of prostate: Secondary | ICD-10-CM | POA: Diagnosis not present

## 2017-10-06 DIAGNOSIS — M25512 Pain in left shoulder: Secondary | ICD-10-CM | POA: Diagnosis not present

## 2017-10-06 DIAGNOSIS — E1165 Type 2 diabetes mellitus with hyperglycemia: Secondary | ICD-10-CM | POA: Diagnosis not present

## 2017-10-06 DIAGNOSIS — I10 Essential (primary) hypertension: Secondary | ICD-10-CM | POA: Diagnosis not present

## 2017-10-06 DIAGNOSIS — K219 Gastro-esophageal reflux disease without esophagitis: Secondary | ICD-10-CM | POA: Diagnosis not present

## 2017-10-06 DIAGNOSIS — H6123 Impacted cerumen, bilateral: Secondary | ICD-10-CM | POA: Diagnosis not present

## 2017-10-06 DIAGNOSIS — C786 Secondary malignant neoplasm of retroperitoneum and peritoneum: Secondary | ICD-10-CM | POA: Diagnosis not present

## 2017-10-06 DIAGNOSIS — E782 Mixed hyperlipidemia: Secondary | ICD-10-CM | POA: Diagnosis not present

## 2017-10-06 DIAGNOSIS — R609 Edema, unspecified: Secondary | ICD-10-CM | POA: Diagnosis not present

## 2017-10-06 DIAGNOSIS — M25511 Pain in right shoulder: Secondary | ICD-10-CM | POA: Diagnosis not present

## 2017-10-06 DIAGNOSIS — D649 Anemia, unspecified: Secondary | ICD-10-CM | POA: Diagnosis not present

## 2017-10-09 ENCOUNTER — Encounter (HOSPITAL_COMMUNITY): Payer: Self-pay

## 2017-10-09 ENCOUNTER — Emergency Department (HOSPITAL_COMMUNITY): Payer: PPO

## 2017-10-09 ENCOUNTER — Emergency Department (HOSPITAL_COMMUNITY)
Admission: EM | Admit: 2017-10-09 | Discharge: 2017-10-09 | Disposition: A | Discharge status: Home / Self Care | Payer: PPO | Attending: Emergency Medicine | Admitting: Emergency Medicine

## 2017-10-09 ENCOUNTER — Encounter: Payer: Self-pay | Admitting: Oncology

## 2017-10-09 ENCOUNTER — Other Ambulatory Visit: Payer: Self-pay

## 2017-10-09 DIAGNOSIS — R112 Nausea with vomiting, unspecified: Secondary | ICD-10-CM | POA: Diagnosis not present

## 2017-10-09 DIAGNOSIS — Z85820 Personal history of malignant melanoma of skin: Secondary | ICD-10-CM | POA: Insufficient documentation

## 2017-10-09 DIAGNOSIS — Z7984 Long term (current) use of oral hypoglycemic drugs: Secondary | ICD-10-CM | POA: Insufficient documentation

## 2017-10-09 DIAGNOSIS — C61 Malignant neoplasm of prostate: Secondary | ICD-10-CM | POA: Diagnosis not present

## 2017-10-09 DIAGNOSIS — Z87891 Personal history of nicotine dependence: Secondary | ICD-10-CM | POA: Diagnosis not present

## 2017-10-09 DIAGNOSIS — Z79899 Other long term (current) drug therapy: Secondary | ICD-10-CM | POA: Insufficient documentation

## 2017-10-09 DIAGNOSIS — C786 Secondary malignant neoplasm of retroperitoneum and peritoneum: Secondary | ICD-10-CM | POA: Diagnosis not present

## 2017-10-09 DIAGNOSIS — R1110 Vomiting, unspecified: Secondary | ICD-10-CM | POA: Diagnosis not present

## 2017-10-09 DIAGNOSIS — E1140 Type 2 diabetes mellitus with diabetic neuropathy, unspecified: Secondary | ICD-10-CM | POA: Insufficient documentation

## 2017-10-09 DIAGNOSIS — R1084 Generalized abdominal pain: Secondary | ICD-10-CM | POA: Diagnosis not present

## 2017-10-09 DIAGNOSIS — Z8546 Personal history of malignant neoplasm of prostate: Secondary | ICD-10-CM | POA: Diagnosis not present

## 2017-10-09 LAB — COMPREHENSIVE METABOLIC PANEL
ALBUMIN: 3.2 g/dL — AB (ref 3.5–5.0)
ALK PHOS: 61 U/L (ref 38–126)
ALT: 22 U/L (ref 17–63)
AST: 17 U/L (ref 15–41)
Anion gap: 12 (ref 5–15)
BUN: 29 mg/dL — AB (ref 6–20)
CALCIUM: 8.7 mg/dL — AB (ref 8.9–10.3)
CHLORIDE: 102 mmol/L (ref 101–111)
CO2: 20 mmol/L — ABNORMAL LOW (ref 22–32)
CREATININE: 1.03 mg/dL (ref 0.61–1.24)
GFR calc Af Amer: >60 mL/min (ref >60)
GFR calc non Af Amer: >60 mL/min (ref >60)
GLUCOSE: 196 mg/dL — AB (ref 65–99)
Potassium: 4.3 mmol/L (ref 3.5–5.1)
SODIUM: 134 mmol/L — AB (ref 135–145)
Total Bilirubin: 0.7 mg/dL (ref 0.3–1.2)
Total Protein: 6.9 g/dL (ref 6.5–8.1)

## 2017-10-09 LAB — URINALYSIS, ROUTINE W REFLEX MICROSCOPIC
BILIRUBIN URINE: NEGATIVE
Glucose, UA: NEGATIVE mg/dL
HGB URINE DIPSTICK: NEGATIVE
Ketones, ur: 5 mg/dL — AB
Leukocytes, UA: NEGATIVE
Nitrite: NEGATIVE
PH: 6 (ref 5.0–8.0)
Protein, ur: NEGATIVE mg/dL

## 2017-10-09 LAB — CBC WITH DIFFERENTIAL/PLATELET
BASOS ABS: 0 10*3/uL (ref 0.0–0.1)
BASOS PCT: 0 %
EOS ABS: 0 10*3/uL (ref 0.0–0.7)
Eosinophils Relative: 1 %
HCT: 34.5 % — ABNORMAL LOW (ref 39.0–52.0)
HEMOGLOBIN: 11.1 g/dL — AB (ref 13.0–17.0)
LYMPHS ABS: 0.4 10*3/uL — AB (ref 0.7–4.0)
Lymphocytes Relative: 8 %
MCH: 27.8 pg (ref 26.0–34.0)
MCHC: 32.2 g/dL (ref 30.0–36.0)
MCV: 86.3 fL (ref 78.0–100.0)
Monocytes Absolute: 0.5 10*3/uL (ref 0.1–1.0)
Monocytes Relative: 10 %
NEUTROS PCT: 81 %
Neutro Abs: 3.9 10*3/uL (ref 1.7–7.7)
Platelets: 341 10*3/uL (ref 150–400)
RBC: 4 MIL/uL — AB (ref 4.22–5.81)
RDW: 13.2 % (ref 11.5–15.5)
WBC: 4.9 10*3/uL (ref 4.0–10.5)

## 2017-10-09 LAB — LIPASE, BLOOD: Lipase: 22 U/L (ref 11–51)

## 2017-10-09 MED ORDER — SODIUM CHLORIDE 0.9 % IV BOLUS
1000.0000 mL | Freq: Once | INTRAVENOUS | Status: AC
  Administered 2017-10-09: 1000 mL via INTRAVENOUS

## 2017-10-09 MED ORDER — MORPHINE SULFATE (PF) 4 MG/ML IV SOLN
4.0000 mg | Freq: Once | INTRAVENOUS | Status: AC
  Administered 2017-10-09: 4 mg via INTRAVENOUS
  Filled 2017-10-09: qty 1

## 2017-10-09 MED ORDER — IOPAMIDOL (ISOVUE-300) INJECTION 61%
INTRAVENOUS | Status: AC
  Filled 2017-10-09: qty 100

## 2017-10-09 MED ORDER — HYDROCODONE-ACETAMINOPHEN 5-325 MG PO TABS: 1.0000 | ORAL_TABLET | ORAL | 0 refills | Status: DC | PRN

## 2017-10-09 MED ORDER — IOPAMIDOL (ISOVUE-300) INJECTION 61%
100.0000 mL | Freq: Once | INTRAVENOUS | Status: AC | PRN
  Administered 2017-10-09: 100 mL via INTRAVENOUS

## 2017-10-09 NOTE — Discharge Instructions (Addendum)
Schedule to see Dr. Alen Blew for evaluation.  Pain medications as needed.

## 2017-10-09 NOTE — ED Triage Notes (Signed)
Pt c/o abdominal pain, nausea and vomiting increasing since end of April. Cancer pt.

## 2017-10-09 NOTE — ED Provider Notes (Signed)
[facility] DEPT Provider Note   CSN: [id number] Arrival date & time: 10/09/17  4098     History   Chief Complaint Chief Complaint  Patient presents with  . Abdominal Pain  . Nausea  . Emesis    HPI Frank Larsen is a 72 y.o. male.  The history is provided by the patient. No language interpreter was used.  Abdominal Pain   This is a new problem. Episode onset: 1 month. The problem occurs constantly. The problem has been gradually worsening. The pain is associated with an unknown factor. The pain is located in the generalized abdominal region. The pain is moderate. Associated symptoms include vomiting. Nothing aggravates the symptoms. Nothing relieves the symptoms. Past workup includes CT scan. Past medical history comments: metastatic cancer.  Emesis   Associated symptoms include abdominal pain.    Past Medical History:  Diagnosis Date  . AK (actinic keratosis)   . Arthritis   . BPH (benign prostatic hyperplasia)   . Chronic low back pain    since 1979  . Diabetes mellitus ORAL MED   type 2  . Elevated PSA   . GERD (gastroesophageal reflux disease)   . Hearing loss    using bilateral hearing aids  . History of radiation therapy 01/11/14- 03/04/14   prostate bed 6600 cGy 33 sessions  . Hypercholesterolemia   . Hypercholesterolemia   . Hyperlipidemia   . Impaired hearing BILATERAL HEARING AIDS   20% RIGHT HEARING DUE TO VIRUS  . Melanoma ([place])   . Normal cardiac stress test 2010  . Prostate cancer ([place]) 07/20/11   Gleason 9  . Prostate cancer ([place])   . Prostate nodule   . Skin cancer    malignant melanoma of skin   . Tinnitus    stable since 1992  . Tobacco use     Patient Active Problem List   Diagnosis Date Noted  . Encounter for antineoplastic chemotherapy 11/16/2016  . Port catheter in place 03/30/2016  . Controlled type 2 diabetes mellitus with neuropathy ([place]) 10/29/2015  . Hypercholesterolemia   . BPH (benign  prostatic hyperplasia)   . Prostate CA ([place]) 08/17/2011    Past Surgical History:  Procedure Laterality Date  . APPENDECTOMY  AGE 68  . CARPOMETACARPAL JOINT ARTHROTOMY  03-22-2006   RIGHT THUMB  . CARPOMETACARPAL JOINT ARTHROTOMY  12-21-2005   LEFT THUMB  . IR GENERIC HISTORICAL  03/22/2016   IR US GUIDE VASC ACCESS RIGHT [facility]  . IR GENERIC HISTORICAL  03/22/2016   IR FLUORO GUIDE PORT INSERTION RIGHT [facility]  . MELANOMA EXCISION  07-12-10   RIGHT NECK  . PROSTATE BIOPSY  07/20/2011   Procedure: BIOPSY TRANSRECTAL ULTRASONIC PROSTATE (TUBP);  Surgeon: Molli Hazard, MD;  Location: [facility];  Service: Urology;  Laterality: N/A;  1 hour requested for this case  Saturation BX  OFFICE TO BRING ULTRASOUND MACHINE    . ROBOT ASSISTED LAPAROSCOPIC RADICAL PROSTATECTOMY  10/19/2011   Procedure: ROBOTIC ASSISTED LAPAROSCOPIC RADICAL PROSTATECTOMY;  Surgeon: Molli Hazard, MD;  Location: [facility];  Service: Urology;  Laterality: N/A;      . SHOULDER OPEN ROTATOR CUFF REPAIR  07-29-2010- RIGHT   AND SUBACROMIAL DECOMPRESSION AROMIOPLASTY  . TONSILLECTOMY  AGE 28        Home Medications    Prior to Admission medications   Medication Sig Start Date End Date Taking? Authorizing Provider  atorvastatin (LIPITOR) 10 MG tablet Take 10 mg by  mouth daily with breakfast.    Yes [provider]  glipiZIDE (GLUCOTROL XL) 5 MG 24 hr tablet Take 1 tablet (5 mg total) by mouth daily with breakfast. 01/30/17  Yes Philemon Kingdom, MD  hydrOXYzine (ATARAX/VISTARIL) 10 MG tablet TAKE 1 TABLET BY MOUTH EVERY 6 HOURS AS NEEDED 09/22/17  Yes Wyatt Portela, MD  lidocaine-prilocaine (EMLA) cream Apply 1 application topically as needed. Apply to port before chemotherapy. 01/23/17  Yes Wyatt Portela, MD  metFORMIN (GLUCOPHAGE) 1000 MG tablet Take 1 tablet (1,000 mg total) by mouth 2 (two) times daily with a meal. 01/30/17  Yes Philemon Kingdom, MD    Multiple Vitamin (MULTIVITAMIN) tablet Take 1 tablet by mouth daily.   Yes [provider]  naproxen sodium (ALEVE) 220 MG tablet Take 440 mg by mouth 2 (two) times daily as needed (pain).   Yes [provider]  omeprazole (PRILOSEC) 20 MG capsule Take 20 mg by mouth every morning.   Yes [provider]  pseudoephedrine-acetaminophen (TYLENOL SINUS) 30-500 MG TABS tablet Take 1 tablet by mouth daily as needed (congestion).    Yes [provider]  sitaGLIPtin (JANUVIA) 100 MG tablet Take 1 tablet (100 mg total) by mouth daily. 09/29/17  Yes Philemon Kingdom, MD  Blood Glucose Monitoring Suppl (ONE TOUCH ULTRA 2) w/Device KIT Use to check blood sugar 2 times per day. 10/30/15   Elayne Snare, MD  glucose blood (ONE TOUCH ULTRA TEST) test strip Use to check blood sugar 2 times per day. 01/25/16   Philemon Kingdom, MD  [facility] DELICA LANCETS 99I MISC Use to check blood sugar 2 times per day. 01/27/16   Philemon Kingdom, MD    Family History Family History  Problem Relation Age of Onset  . Cancer Mother        breast/lived 9 years post  . Alzheimer's disease Mother        deceased  . Cancer Brother        thyroid  . Heart disease Brother   . Stroke Father        54 years old  . Hypertension Father   . Alcoholism Father   . CVA Father   . Cirrhosis Paternal Grandfather        alcohol-related  . Cancer Paternal Grandmother        unknown type     Social History Social History   Tobacco Use  . Smoking status: Former Smoker    Packs/day: 1.00    Years: 43.00    Pack years: 43.00    Types: Cigarettes    Last attempt to quit: 12/07/2007    Years since quitting: 9.8  . Smokeless tobacco: Never Used  Substance Use Topics  . Alcohol use: Yes    Comment: OCCASIONAL  . Drug use: No     Allergies   Scallops [shellfish allergy] and Cephalexin   Review of Systems Review of Systems  Gastrointestinal: Positive for abdominal pain and vomiting.   All other systems reviewed and are negative.    Physical Exam Updated Vital Signs BP 133/74   Pulse 80   Temp 97.7 F (36.5 C) (Oral)   Resp 16   SpO2 96%   Physical Exam  Constitutional: He appears well-developed and well-nourished.  HENT:  Head: Normocephalic.  Eyes: Pupils are equal, round, and reactive to light. EOM are normal.  Cardiovascular: Normal rate and normal heart sounds.  Pulmonary/Chest: Effort normal.  Abdominal: Normal appearance and bowel sounds are normal. He  exhibits distension. There is generalized tenderness.  Neurological: He is alert.  Skin: Skin is warm.  Nursing note and vitals reviewed.    ED Treatments / Results  Labs (all labs ordered are listed, but only abnormal results are displayed) Labs Reviewed  CBC WITH DIFFERENTIAL/PLATELET - Abnormal; Notable for the following components:      Result Value   RBC 4.00 (*)    Hemoglobin 11.1 (*)    HCT 34.5 (*)    Lymphs Abs 0.4 (*)    All other components within normal limits  COMPREHENSIVE METABOLIC PANEL - Abnormal; Notable for the following components:   Sodium 134 (*)    CO2 20 (*)    Glucose, Bld 196 (*)    BUN 29 (*)    Calcium 8.7 (*)    Albumin 3.2 (*)    All other components within normal limits  URINALYSIS, ROUTINE W REFLEX MICROSCOPIC - Abnormal; Notable for the following components:   Color, Urine STRAW (*)    Specific Gravity, Urine >1.046 (*)    Ketones, ur 5 (*)    All other components within normal limits  LIPASE, BLOOD    EKG None  Radiology Ct Chest W Contrast  Result Date: 10/09/2017 CLINICAL DATA:  Increasing abdominal pain. Prostate cancer with peritoneal metastatic disease EXAM: CT CHEST, ABDOMEN, AND PELVIS WITH CONTRAST TECHNIQUE: Multidetector CT imaging of the chest, abdomen and pelvis was performed following the standard protocol during bolus administration of intravenous contrast. CONTRAST:  121m ISOVUE-300 IOPAMIDOL (ISOVUE-300) INJECTION 61% COMPARISON:   06/29/2017 FINDINGS: CT CHEST FINDINGS Cardiovascular: Normal heart size. No pericardial effusion. Aortic and coronary atherosclerosis. Mediastinum/Nodes: Interval enlargement of anterior juxta diaphragmatic lymph nodes, 16 mm short axis right para median. Right thyroid nodule, incidental given the clinical circumstances. Lungs/Pleura: Small and dependent right pleural effusion. Trace left pleural effusion. These are stable from prior. Calcified granuloma in the right lower lobe. Negative for pulmonary parenchymal metastasis. Musculoskeletal: Stable sclerosis in the lateral right eighth rib. No acute finding. CT ABDOMEN PELVIS FINDINGS Hepatobiliary: 1 cm low-density in the left liver that is stable since 2017 and benign.No evidence of biliary obstruction or stone. Pancreas: Unremarkable. Spleen: Unremarkable. Adrenals/Urinary Tract: Negative adrenals. No hydronephrosis or stone. Unremarkable bladder. Stomach/Bowel: The distal small bowel shows diffuse low-density submucosal thickening with prominent serosal enhancement. No obstruction or perforation is seen. Major mesenteric vessels are patent Vascular/Lymphatic: Diffuse atheromatous wall thickening of the aorta and iliacs. Rounded presumed lymph node right peri aortic in the upper abdomen has enlarged to 14 mm diameter. Adenopathy versus is peritoneal nodularity along the ascending colon measuring 15 mm in diameter, progressed. Reproductive:Prostatectomy. No mass in the surgical bed Other: There is small volume generalized ascites with peritoneal thickening intermittently seen. Musculoskeletal: Subcentimeter sclerosis in the upper right ilium is stable. IMPRESSION: 1. Distal small bowel enteritis, nonspecific. 2. Known peritoneal metastatic disease with progressive nodularity and new small ascites. Negative for obstruction. 3. New juxta diaphragmatic lymphadenopathy attributed to #2. 4. Right periaortic nodule, presumably adenopathy, new from prior. Electronically  Signed   By: JMonte FantasiaM.D.   On: 10/09/2017 10:37   Ct Abdomen Pelvis W Contrast  Result Date: 10/09/2017 CLINICAL DATA:  Increasing abdominal pain. Prostate cancer with peritoneal metastatic disease EXAM: CT CHEST, ABDOMEN, AND PELVIS WITH CONTRAST TECHNIQUE: Multidetector CT imaging of the chest, abdomen and pelvis was performed following the standard protocol during bolus administration of intravenous contrast. CONTRAST:  1064mISOVUE-300 IOPAMIDOL (ISOVUE-300) INJECTION 61% COMPARISON:  06/29/2017 FINDINGS: CT  CHEST FINDINGS Cardiovascular: Normal heart size. No pericardial effusion. Aortic and coronary atherosclerosis. Mediastinum/Nodes: Interval enlargement of anterior juxta diaphragmatic lymph nodes, 16 mm short axis right para median. Right thyroid nodule, incidental given the clinical circumstances. Lungs/Pleura: Small and dependent right pleural effusion. Trace left pleural effusion. These are stable from prior. Calcified granuloma in the right lower lobe. Negative for pulmonary parenchymal metastasis. Musculoskeletal: Stable sclerosis in the lateral right eighth rib. No acute finding. CT ABDOMEN PELVIS FINDINGS Hepatobiliary: 1 cm low-density in the left liver that is stable since 2017 and benign.No evidence of biliary obstruction or stone. Pancreas: Unremarkable. Spleen: Unremarkable. Adrenals/Urinary Tract: Negative adrenals. No hydronephrosis or stone. Unremarkable bladder. Stomach/Bowel: The distal small bowel shows diffuse low-density submucosal thickening with prominent serosal enhancement. No obstruction or perforation is seen. Major mesenteric vessels are patent Vascular/Lymphatic: Diffuse atheromatous wall thickening of the aorta and iliacs. Rounded presumed lymph node right peri aortic in the upper abdomen has enlarged to 14 mm diameter. Adenopathy versus is peritoneal nodularity along the ascending colon measuring 15 mm in diameter, progressed. Reproductive:Prostatectomy. No mass in  the surgical bed Other: There is small volume generalized ascites with peritoneal thickening intermittently seen. Musculoskeletal: Subcentimeter sclerosis in the upper right ilium is stable. IMPRESSION: 1. Distal small bowel enteritis, nonspecific. 2. Known peritoneal metastatic disease with progressive nodularity and new small ascites. Negative for obstruction. 3. New juxta diaphragmatic lymphadenopathy attributed to #2. 4. Right periaortic nodule, presumably adenopathy, new from prior. Electronically Signed   By: Monte Fantasia M.D.   On: 10/09/2017 10:37    Procedures Procedures (including critical care time)  Medications Ordered in ED Medications  iopamidol (ISOVUE-300) 61 % injection (has no administration in time range)  iopamidol (ISOVUE-300) 61 % injection 100 mL (100 mLs Intravenous Contrast Given 10/09/17 1005)     Initial Impression / Assessment and Plan / ED Course  I have reviewed the triage vital signs and the nursing notes.  Pertinent labs & imaging results that were available during my care of the patient were reviewed by me and considered in my medical decision making (see chart for details).     MDM  Ct chest and abdomen obtained.  Ct has increased lymphadenopathy and colitis noted.   Dr. Venora Maples in to see and examine. Dr. Venora Maples counseled on results.  Pt given morphine Iv.   Pt given rx for hydrocodone. He is advised to call his oncologist to schedule appointment to be seen for evaluation   Final Clinical Impressions(s) / ED Diagnoses   Final diagnoses:  Generalized abdominal pain    ED Discharge Orders        Ordered    HYDROcodone-acetaminophen (NORCO/VICODIN) 5-325 MG tablet  Every 4 hours PRN     10/09/17 1336    An After Visit Summary was printed and given to the patient.    Sidney Ace 10/09/17 [place], MD 10/10/17 0800

## 2017-10-10 ENCOUNTER — Telehealth: Payer: Self-pay | Admitting: *Deleted

## 2017-10-10 NOTE — Telephone Encounter (Signed)
Please let him know to come 6/5 at 3:15pm. Message to scheduling sent.

## 2017-10-10 NOTE — Telephone Encounter (Signed)
Patient calling to say he was in the E.R. With abdominal pain and CT scan done. Would like to see dr Alen Blew, if possible, sooner than, June 21st.

## 2017-10-11 ENCOUNTER — Inpatient Hospital Stay: Payer: PPO

## 2017-10-11 ENCOUNTER — Encounter: Payer: Self-pay | Admitting: *Deleted

## 2017-10-11 ENCOUNTER — Inpatient Hospital Stay: Payer: PPO | Attending: Oncology | Admitting: Oncology

## 2017-10-11 ENCOUNTER — Telehealth: Payer: Self-pay | Admitting: *Deleted

## 2017-10-11 VITALS — BP 131/70 | HR 89 | Temp 97.9°F | Resp 18 | Ht 70.5 in | Wt 181.9 lb

## 2017-10-11 DIAGNOSIS — E119 Type 2 diabetes mellitus without complications: Secondary | ICD-10-CM | POA: Diagnosis not present

## 2017-10-11 DIAGNOSIS — E46 Unspecified protein-calorie malnutrition: Secondary | ICD-10-CM

## 2017-10-11 DIAGNOSIS — R109 Unspecified abdominal pain: Secondary | ICD-10-CM | POA: Diagnosis not present

## 2017-10-11 DIAGNOSIS — C61 Malignant neoplasm of prostate: Secondary | ICD-10-CM | POA: Diagnosis not present

## 2017-10-11 DIAGNOSIS — C786 Secondary malignant neoplasm of retroperitoneum and peritoneum: Secondary | ICD-10-CM | POA: Insufficient documentation

## 2017-10-11 DIAGNOSIS — J90 Pleural effusion, not elsewhere classified: Secondary | ICD-10-CM | POA: Diagnosis not present

## 2017-10-11 DIAGNOSIS — L299 Pruritus, unspecified: Secondary | ICD-10-CM | POA: Insufficient documentation

## 2017-10-11 DIAGNOSIS — Z5189 Encounter for other specified aftercare: Secondary | ICD-10-CM | POA: Insufficient documentation

## 2017-10-11 DIAGNOSIS — R634 Abnormal weight loss: Secondary | ICD-10-CM | POA: Diagnosis not present

## 2017-10-11 DIAGNOSIS — E860 Dehydration: Secondary | ICD-10-CM | POA: Diagnosis not present

## 2017-10-11 DIAGNOSIS — Z79899 Other long term (current) drug therapy: Secondary | ICD-10-CM | POA: Diagnosis not present

## 2017-10-11 DIAGNOSIS — D649 Anemia, unspecified: Secondary | ICD-10-CM | POA: Diagnosis not present

## 2017-10-11 DIAGNOSIS — Z5111 Encounter for antineoplastic chemotherapy: Secondary | ICD-10-CM | POA: Insufficient documentation

## 2017-10-11 DIAGNOSIS — Z7984 Long term (current) use of oral hypoglycemic drugs: Secondary | ICD-10-CM | POA: Diagnosis not present

## 2017-10-11 DIAGNOSIS — Z9221 Personal history of antineoplastic chemotherapy: Secondary | ICD-10-CM

## 2017-10-11 DIAGNOSIS — K529 Noninfective gastroenteritis and colitis, unspecified: Secondary | ICD-10-CM | POA: Diagnosis not present

## 2017-10-11 DIAGNOSIS — R188 Other ascites: Secondary | ICD-10-CM | POA: Diagnosis not present

## 2017-10-11 MED ORDER — MORPHINE SULFATE (PF) 4 MG/ML IV SOLN
INTRAVENOUS | Status: AC
  Filled 2017-10-11: qty 1

## 2017-10-11 MED ORDER — HEPARIN SOD (PORK) LOCK FLUSH 100 UNIT/ML IV SOLN
500.0000 [IU] | Freq: Once | INTRAVENOUS | Status: AC
  Administered 2017-10-11: 500 [IU] via INTRAVENOUS
  Filled 2017-10-11: qty 5

## 2017-10-11 MED ORDER — SODIUM CHLORIDE 0.9% FLUSH
10.0000 mL | INTRAVENOUS | Status: DC | PRN
  Administered 2017-10-11: 10 mL via INTRAVENOUS
  Filled 2017-10-11: qty 10

## 2017-10-11 MED ORDER — SODIUM CHLORIDE 0.9 % IV SOLN
Freq: Once | INTRAVENOUS | Status: DC
  Administered 2017-10-11: 16:00:00 via INTRAVENOUS

## 2017-10-11 MED ORDER — MORPHINE SULFATE (PF) 4 MG/ML IV SOLN
2.0000 mg | Freq: Once | INTRAVENOUS | Status: AC
  Administered 2017-10-11: 2 mg via INTRAVENOUS

## 2017-10-11 NOTE — Patient Instructions (Signed)
Dehydration, Adult Dehydration is when there is not enough fluid or water in your body. This happens when you lose more fluids than you take in. Dehydration can range from mild to very bad. It should be treated right away to keep it from getting very bad. Symptoms of mild dehydration may include:  Thirst.  Dry lips.  Slightly dry mouth.  Dry, warm skin.  Dizziness. Symptoms of moderate dehydration may include:  Very dry mouth.  Muscle cramps.  Dark pee (urine). Pee may be the color of tea.  Your body making less pee.  Your eyes making fewer tears.  Heartbeat that is uneven or faster than normal (palpitations).  Headache.  Light-headedness, especially when you stand up from sitting.  Fainting (syncope). Symptoms of very bad dehydration may include:  Changes in skin, such as: ? Cold and clammy skin. ? Blotchy (mottled) or pale skin. ? Skin that does not quickly return to normal after being lightly pinched and let go (poor skin turgor).  Changes in body fluids, such as: ? Feeling very thirsty. ? Your eyes making fewer tears. ? Not sweating when body temperature is high, such as in hot weather. ? Your body making very little pee.  Changes in vital signs, such as: ? Weak pulse. ? Pulse that is more than 100 beats a minute when you are sitting still. ? Fast breathing. ? Low blood pressure.  Other changes, such as: ? Sunken eyes. ? Cold hands and feet. ? Confusion. ? Lack of energy (lethargy). ? Trouble waking up from sleep. ? Short-term weight loss. ? Unconsciousness. Follow these instructions at home:  If told by your doctor, drink an ORS: ? Make an ORS by using instructions on the package. ? Start by drinking small amounts, about  cup (120 mL) every 5-10 minutes. ? Slowly drink more until you have had the amount that your doctor said to have.  Drink enough clear fluid to keep your pee clear or pale yellow. If you were told to drink an ORS, finish the ORS  first, then start slowly drinking clear fluids. Drink fluids such as: ? Water. Do not drink only water by itself. Doing that can make the salt (sodium) level in your body get too low (hyponatremia). ? Ice chips. ? Fruit juice that you have added water to (diluted). ? Low-calorie sports drinks.  Avoid: ? Alcohol. ? Drinks that have a lot of sugar. These include high-calorie sports drinks, fruit juice that does not have water added, and soda. ? Caffeine. ? Foods that are greasy or have a lot of fat or sugar.  Take over-the-counter and prescription medicines only as told by your doctor.  Do not take salt tablets. Doing that can make the salt level in your body get too high (hypernatremia).  Eat foods that have minerals (electrolytes). Examples include bananas, oranges, potatoes, tomatoes, and spinach.  Keep all follow-up visits as told by your doctor. This is important. Contact a doctor if:  You have belly (abdominal) pain that: ? Gets worse. ? Stays in one area (localizes).  You have a rash.  You have a stiff neck.  You get angry or annoyed more easily than normal (irritability).  You are more sleepy than normal.  You have a harder time waking up than normal.  You feel: ? Weak. ? Dizzy. ? Very thirsty.  You have peed (urinated) only a small amount of very dark pee during 6-8 hours. Get help right away if:  You have symptoms of   very bad dehydration.  You cannot drink fluids without throwing up (vomiting).  Your symptoms get worse with treatment.  You have a fever.  You have a very bad headache.  You are throwing up or having watery poop (diarrhea) and it: ? Gets worse. ? Does not go away.  You have blood or something green (bile) in your throw-up.  You have blood in your poop (stool). This may cause poop to look black and tarry.  You have not peed in 6-8 hours.  You pass out (faint).  Your heart rate when you are sitting still is more than 100 beats a  minute.  You have trouble breathing. This information is not intended to replace advice given to you by your health care provider. Make sure you discuss any questions you have with your health care provider. Document Released: 02/19/2009 Document Revised: 11/13/2015 Document Reviewed: 06/19/2015 Elsevier Interactive Patient Education  2018 [employer]  

## 2017-10-11 NOTE — Progress Notes (Signed)
Hematology and Oncology Follow Up Visit  Frank Larsen [id number] 04-Apr-1946 72 y.o. 10/11/2017 4:16 PM Frank Larsen, Frank Brow, MD   Principle Diagnosis: 72 year old man with advanced prostate cancer with poorly differentiated features including neuroendocrine pathology.   He was initially diagnosed in 2013 and developed advanced disease in 2017.   Prior Therapy:  He is status post robotic-assisted laparoscopic radical prostatectomy and extended lymphadenectomy with the pathology reveals T2N0.  completed in June 2013. Taxotere chemotherapy at 75 mg/m every 3 weeks started on 03/30/2016. He is status post 9 cycles of therapy.  He developed relapsed disease and a biopsy proven to show neuroendocrine poorly differentiated tumor in June 2018. He is S/P Carboplatin and etoposide. Cycle 1 given on 10/24/2016. He completed cycle 6 on February 13, 2017 with near complete response.  Current therapy: Under evaluation to start salvage therapy.  Interim History: Frank Larsen presents today for a follow-up.  Since the last visit, he has reported increased symptoms of abdominal pain, distention and weight loss.  He was seen in the emergency department on 10/09/2017 with these complaints and had a abdominal imaging which confirmed evidence of disease progression.  He was given intravenous hydration as well as morphine which have helped his symptoms slightly.  His appetite has been poor and continues to lose weight.  The symptoms started in the last few weeks prior to that he has been able to perform all activities of daily living and was traveling to [place].  His bowel habits have been fluctuating but mostly constipation recently where he started having loose bowel movements after taking stool softeners.  He has been using hydrocodone which have helped his pain periodically.  His oral intake has been poor and has not been getting enough fluid.  He has not reported any dizziness or lightheadedness.  He  does not report any headaches, blurry vision, syncope or seizures.  He denied any alteration of mental status, psychiatric issues or depression.  He does not report any fevers, chills, sweats. He does not report any chest pain, palpitation, orthopnea or leg edema. He does not report any cough, wheezing or hemoptysis. He does not report any hematochezia, melena. He  does not report any frequency, urgency or hematuria. He does not report any bone pain or pathological fractures.  He denied any heat or cold intolerance.  He does not report any skin rashes or lesions.  He denies any lymphadenopathy.  Remaining review of systems is negative.   Medications: I have reviewed the patient's current medications.  Current Outpatient Medications  Medication Sig Dispense Refill  . atorvastatin (LIPITOR) 10 MG tablet Take 10 mg by mouth daily with breakfast.     . Blood Glucose Monitoring Suppl (ONE TOUCH ULTRA 2) w/Device KIT Use to check blood sugar 2 times per day. 1 each 2  . glipiZIDE (GLUCOTROL XL) 5 MG 24 hr tablet Take 1 tablet (5 mg total) by mouth daily with breakfast. 90 tablet 3  . glucose blood (ONE TOUCH ULTRA TEST) test strip Use to check blood sugar 2 times per day. 200 each 5  . HYDROcodone-acetaminophen (NORCO/VICODIN) 5-325 MG tablet Take 1 tablet by mouth every 4 (four) hours as needed. 16 tablet 0  . hydrOXYzine (ATARAX/VISTARIL) 10 MG tablet TAKE 1 TABLET BY MOUTH EVERY 6 HOURS AS NEEDED 60 tablet 0  . lidocaine-prilocaine (EMLA) cream Apply 1 application topically as needed. Apply to port before chemotherapy. 30 g 0  . metFORMIN (GLUCOPHAGE) 1000 MG tablet Take 1 tablet (  1,000 mg total) by mouth 2 (two) times daily with a meal. 180 tablet 3  . Multiple Vitamin (MULTIVITAMIN) tablet Take 1 tablet by mouth daily.    . naproxen sodium (ALEVE) 220 MG tablet Take 440 mg by mouth 2 (two) times daily as needed (pain).    . omeprazole (PRILOSEC) 20 MG capsule Take 20 mg by mouth every morning.    .  ONETOUCH DELICA LANCETS 33G MISC Use to check blood sugar 2 times per day. 200 each 2  . pseudoephedrine-acetaminophen (TYLENOL SINUS) 30-500 MG TABS tablet Take 1 tablet by mouth daily as needed (congestion).     . sitaGLIPtin (JANUVIA) 100 MG tablet Take 1 tablet (100 mg total) by mouth daily. 90 tablet 3   Current Facility-Administered Medications  Medication Dose Route Frequency Provider Last Rate Last Dose  . 0.9 %  sodium chloride infusion   Intravenous Once Shadad, Firas N, MD         Allergies:  Allergies  Allergen Reactions  . Scallops [Shellfish Allergy] Nausea And Vomiting  . Cephalexin Hives and Rash    Past Medical History, Surgical history, Social history, and Family History updated and remain unchanged.   Physical Exam: Blood pressure 131/70, pulse 89, temperature 97.9 F (36.6 C), resp. rate 18, height 5' 10.5" (1.791 m), weight 181 lb 14.4 oz (82.5 kg), SpO2 98 %.   ECOG: 1 General appearance: Ill-appearing gentleman with mild distress. Head: Atraumatic without abnormalities. Oral mucosa: No oral thrush or ulcers.  Mucous membranes are dry. Lymph nodes: No lymphadenopathy noted in the cervical, supraclavicular, or axillary nodes Heart: Regular rate without any murmurs or gallops. Lung: Clear without any rhonchi, wheezes or dullness to percussion. Abdomin: Soft, distended with good bowel sounds.  No shifting dullness or ascites.  Tender on palpation. Musculoskeletal: No joint deformity or effusion. Skin: Skin is moist without any ecchymosis or petechiae. Neurological: No motor or sensory deficits. Psychiatric: Appropriate mood and affect.  Lab Results: Lab Results  Component Value Date   WBC 4.9 10/09/2017   HGB 11.1 (L) 10/09/2017   HCT 34.5 (L) 10/09/2017   MCV 86.3 10/09/2017   PLT 341 10/09/2017     Chemistry      Component Value Date/Time   NA 134 (L) 10/09/2017 0807   NA 140 04/25/2017 1136   K 4.3 10/09/2017 0807   K 3.9 04/25/2017 1136    CL 102 10/09/2017 0807   CO2 20 (L) 10/09/2017 0807   CO2 25 04/25/2017 1136   BUN 29 (H) 10/09/2017 0807   BUN 15.8 04/25/2017 1136   CREATININE 1.03 10/09/2017 0807   CREATININE 0.92 07/25/2017 0807   CREATININE 0.9 04/25/2017 1136   GLU 155 08/24/2015      Component Value Date/Time   CALCIUM 8.7 (L) 10/09/2017 0807   CALCIUM 9.2 04/25/2017 1136   ALKPHOS 61 10/09/2017 0807   ALKPHOS 55 04/25/2017 1136   AST 17 10/09/2017 0807   AST 18 07/25/2017 0807   AST 19 04/25/2017 1136   ALT 22 10/09/2017 0807   ALT 21 07/25/2017 0807   ALT 15 04/25/2017 1136   BILITOT 0.7 10/09/2017 0807   BILITOT 0.4 07/25/2017 0807   BILITOT 0.32 04/25/2017 1136      EXAM: CT CHEST, ABDOMEN, AND PELVIS WITH CONTRAST  TECHNIQUE: Multidetector CT imaging of the chest, abdomen and pelvis was performed following the standard protocol during bolus administration of intravenous contrast.  CONTRAST:  100mL ISOVUE-300 IOPAMIDOL (ISOVUE-300) INJECTION 61%  COMPARISON:    06/29/2017  FINDINGS: CT CHEST FINDINGS  Cardiovascular: Normal heart size. No pericardial effusion. Aortic and coronary atherosclerosis.  Mediastinum/Nodes: Interval enlargement of anterior juxta diaphragmatic lymph nodes, 16 mm short axis right para median. Right thyroid nodule, incidental given the clinical circumstances.  Lungs/Pleura: Small and dependent right pleural effusion. Trace left pleural effusion. These are stable from prior. Calcified granuloma in the right lower lobe. Negative for pulmonary parenchymal metastasis.  Musculoskeletal: Stable sclerosis in the lateral right eighth rib. No acute finding.  CT ABDOMEN PELVIS FINDINGS  Hepatobiliary: 1 cm low-density in the left liver that is stable since 2017 and benign.No evidence of biliary obstruction or stone.  Pancreas: Unremarkable.  Spleen: Unremarkable.  Adrenals/Urinary Tract: Negative adrenals. No hydronephrosis or stone. Unremarkable  bladder.  Stomach/Bowel: The distal small bowel shows diffuse low-density submucosal thickening with prominent serosal enhancement. No obstruction or perforation is seen. Major mesenteric vessels are patent  Vascular/Lymphatic: Diffuse atheromatous wall thickening of the aorta and iliacs. Rounded presumed lymph node right peri aortic in the upper abdomen has enlarged to 14 mm diameter. Adenopathy versus is peritoneal nodularity along the ascending colon measuring 15 mm in diameter, progressed.  Reproductive:Prostatectomy. No mass in the surgical bed  Other: There is small volume generalized ascites with peritoneal thickening intermittently seen.  Musculoskeletal: Subcentimeter sclerosis in the upper right ilium is stable.  IMPRESSION: 1. Distal small bowel enteritis, nonspecific. 2. Known peritoneal metastatic disease with progressive nodularity and new small ascites. Negative for obstruction. 3. New juxta diaphragmatic lymphadenopathy attributed to #2. 4. Right periaortic nodule, presumably adenopathy, new from prior.   Impression and Plan:   72-year-old man with:  1.  Metastatic prostate cancer with poorly differentiated pathology including neuroendocrine features and peritoneal involvement.  He is status post chemotherapy outlined above including carboplatin and etoposide completed in October 2018.  CT scan obtained on 10/09/2017 was personally reviewed and discussed with the patient.  He has a progression of disease not only by radiographic criteria but also by clinical signs and symptoms.  The natural course of this disease was reviewed again including treatment options.  Any treatment given at this time will be purely palliative.  Treatment options were reviewed today which include carboplatin-based chemotherapy regimen which she has responded very well to in the past, topotecan single agent, immunotherapy and potentially clinical trial.  After discussion today, we  will proceed with carboplatin and paclitaxel for palliative purposes consideration for any other therapy if he does not respond quickly.  Complication associated with this chemotherapy include nausea, vomiting, myelosuppression, infusion related toxicities, neuropathy among others.  After discussion today, he is agreeable to proceed and will start in the immediate future.  2.  Poor p.o. intake and dehydration: He will receive intravenous hydration today to help manage his symptoms to chemotherapy started.   3. IV access: Port-A-Cath will be used for chemotherapy moving forward.  4.  Pain: He will use hydrocodone for the time being stronger chemotherapy can be used in the future.   5. Anemia: Hemoglobin is stable likely hemoconcentrated.  We will continue to monitor and transfuse as needed.  6.  Prognosis: This was discussed today in detail and reiterated to him that we are dealing with incurable malignancy.  His performance status remains adequate and aggressive therapy remains warranted at this time.  7. Follow-up: Will be in the immediate future to start chemotherapy..  30  minutes was spent with the patient face-to-face today.  More than 50% of time was dedicated to patient counseling,   education and pronating his care in addition to discussing future plan of care.  Firas Shadad, MD 6/5/20194:16 PM 

## 2017-10-11 NOTE — Progress Notes (Signed)
DISCONTINUE ON PATHWAY REGIMEN - Prostate     A cycle is every 21 days:     Etoposide      Carboplatin   **Always confirm dose/schedule in your pharmacy ordering system**  REASON: Disease Progression PRIOR TREATMENT: POS23: Carboplatin Day 1 and Etoposide Days 1-3 q21 Days TREATMENT RESPONSE: Complete Response (CR)  START OFF PATHWAY REGIMEN - Prostate   [id number]:Carboplatin + Paclitaxel (5/175) q21 Days:   A cycle is every 21 days:     Paclitaxel      Carboplatin   **Always confirm dose/schedule in your pharmacy ordering system**  Patient Characteristics: Small Cell, Small Cell Carcinoma Current radiographic evidence of distant metastasis<= Yes Histology: Small Cell Carcinoma AJCC T Category: Staged < 8th Ed. Gleason Primary: Staged < 8th Ed. AJCC N Category: Staged < 8th Ed. Gleason Secondary: Staged < 8th Ed. AJCC M Category: Staged < 8th Ed. Gleason Score: Staged < 8th Ed. AJCC 8 Stage Grouping: Staged < 8th Ed. PSA Values (ng/mL): Staged < 8th Ed.  Intent of Therapy: Non-Curative / Palliative Intent, Discussed with Patient

## 2017-10-11 NOTE — Telephone Encounter (Signed)
Spoke with patient, gave appt time for O.V. 3:15 Today with dr Alen Blew

## 2017-10-12 ENCOUNTER — Inpatient Hospital Stay: Payer: PPO

## 2017-10-12 VITALS — BP 130/67 | HR 72 | Temp 99.1°F | Resp 18

## 2017-10-12 DIAGNOSIS — C61 Malignant neoplasm of prostate: Secondary | ICD-10-CM | POA: Diagnosis not present

## 2017-10-12 DIAGNOSIS — Z95828 Presence of other vascular implants and grafts: Secondary | ICD-10-CM

## 2017-10-12 MED ORDER — SODIUM CHLORIDE 0.9% FLUSH
10.0000 mL | INTRAVENOUS | Status: DC | PRN
  Administered 2017-10-12: 10 mL
  Filled 2017-10-12: qty 10

## 2017-10-12 MED ORDER — ALTEPLASE 2 MG IJ SOLR
2.0000 mg | Freq: Once | INTRAMUSCULAR | Status: DC | PRN
  Filled 2017-10-12: qty 2

## 2017-10-12 MED ORDER — SODIUM CHLORIDE 0.9 % IV SOLN
1000.0000 mL | Freq: Once | INTRAVENOUS | Status: AC
  Administered 2017-10-12: 1000 mL via INTRAVENOUS

## 2017-10-12 MED ORDER — HEPARIN SOD (PORK) LOCK FLUSH 100 UNIT/ML IV SOLN
500.0000 [IU] | Freq: Once | INTRAVENOUS | Status: AC | PRN
  Administered 2017-10-12: 500 [IU]
  Filled 2017-10-12: qty 5

## 2017-10-12 NOTE — Addendum Note (Signed)
Addended by: Wyatt Portela on: 10/12/2017 02:24 PM   Modules accepted: Orders

## 2017-10-12 NOTE — Patient Instructions (Signed)
Dehydration, Adult Dehydration is when there is not enough fluid or water in your body. This happens when you lose more fluids than you take in. Dehydration can range from mild to very bad. It should be treated right away to keep it from getting very bad. Symptoms of mild dehydration may include:  Thirst.  Dry lips.  Slightly dry mouth.  Dry, warm skin.  Dizziness. Symptoms of moderate dehydration may include:  Very dry mouth.  Muscle cramps.  Dark pee (urine). Pee may be the color of tea.  Your body making less pee.  Your eyes making fewer tears.  Heartbeat that is uneven or faster than normal (palpitations).  Headache.  Light-headedness, especially when you stand up from sitting.  Fainting (syncope). Symptoms of very bad dehydration may include:  Changes in skin, such as: ? Cold and clammy skin. ? Blotchy (mottled) or pale skin. ? Skin that does not quickly return to normal after being lightly pinched and let go (poor skin turgor).  Changes in body fluids, such as: ? Feeling very thirsty. ? Your eyes making fewer tears. ? Not sweating when body temperature is high, such as in hot weather. ? Your body making very little pee.  Changes in vital signs, such as: ? Weak pulse. ? Pulse that is more than 100 beats a minute when you are sitting still. ? Fast breathing. ? Low blood pressure.  Other changes, such as: ? Sunken eyes. ? Cold hands and feet. ? Confusion. ? Lack of energy (lethargy). ? Trouble waking up from sleep. ? Short-term weight loss. ? Unconsciousness. Follow these instructions at home:  If told by your doctor, drink an ORS: ? Make an ORS by using instructions on the package. ? Start by drinking small amounts, about  cup (120 mL) every 5-10 minutes. ? Slowly drink more until you have had the amount that your doctor said to have.  Drink enough clear fluid to keep your pee clear or pale yellow. If you were told to drink an ORS, finish the ORS  first, then start slowly drinking clear fluids. Drink fluids such as: ? Water. Do not drink only water by itself. Doing that can make the salt (sodium) level in your body get too low (hyponatremia). ? Ice chips. ? Fruit juice that you have added water to (diluted). ? Low-calorie sports drinks.  Avoid: ? Alcohol. ? Drinks that have a lot of sugar. These include high-calorie sports drinks, fruit juice that does not have water added, and soda. ? Caffeine. ? Foods that are greasy or have a lot of fat or sugar.  Take over-the-counter and prescription medicines only as told by your doctor.  Do not take salt tablets. Doing that can make the salt level in your body get too high (hypernatremia).  Eat foods that have minerals (electrolytes). Examples include bananas, oranges, potatoes, tomatoes, and spinach.  Keep all follow-up visits as told by your doctor. This is important. Contact a doctor if:  You have belly (abdominal) pain that: ? Gets worse. ? Stays in one area (localizes).  You have a rash.  You have a stiff neck.  You get angry or annoyed more easily than normal (irritability).  You are more sleepy than normal.  You have a harder time waking up than normal.  You feel: ? Weak. ? Dizzy. ? Very thirsty.  You have peed (urinated) only a small amount of very dark pee during 6-8 hours. Get help right away if:  You have symptoms of   very bad dehydration.  You cannot drink fluids without throwing up (vomiting).  Your symptoms get worse with treatment.  You have a fever.  You have a very bad headache.  You are throwing up or having watery poop (diarrhea) and it: ? Gets worse. ? Does not go away.  You have blood or something green (bile) in your throw-up.  You have blood in your poop (stool). This may cause poop to look black and tarry.  You have not peed in 6-8 hours.  You pass out (faint).  Your heart rate when you are sitting still is more than 100 beats a  minute.  You have trouble breathing. This information is not intended to replace advice given to you by your health care provider. Make sure you discuss any questions you have with your health care provider. Document Released: 02/19/2009 Document Revised: 11/13/2015 Document Reviewed: 06/19/2015 Elsevier Interactive Patient Education  2018 [employer]  

## 2017-10-17 ENCOUNTER — Other Ambulatory Visit: Payer: Self-pay | Admitting: Oncology

## 2017-10-17 ENCOUNTER — Ambulatory Visit (HOSPITAL_COMMUNITY): Payer: PPO

## 2017-10-17 ENCOUNTER — Inpatient Hospital Stay: Payer: PPO

## 2017-10-17 VITALS — BP 127/69 | HR 81 | Temp 98.6°F | Resp 20 | Wt 183.0 lb

## 2017-10-17 DIAGNOSIS — C61 Malignant neoplasm of prostate: Secondary | ICD-10-CM

## 2017-10-17 DIAGNOSIS — Z95828 Presence of other vascular implants and grafts: Secondary | ICD-10-CM

## 2017-10-17 LAB — CMP (CANCER CENTER ONLY)
ALK PHOS: 65 U/L (ref 40–150)
ALT: 15 U/L (ref 0–55)
AST: 19 U/L (ref 5–34)
Albumin: 3 g/dL — ABNORMAL LOW (ref 3.5–5.0)
Anion gap: 13 — ABNORMAL HIGH (ref 3–11)
BUN: 21 mg/dL (ref 7–26)
CHLORIDE: 102 mmol/L (ref 98–109)
CO2: 23 mmol/L (ref 22–29)
CREATININE: 0.95 mg/dL (ref 0.70–1.30)
Calcium: 9.2 mg/dL (ref 8.4–10.4)
Glucose, Bld: 143 mg/dL — ABNORMAL HIGH (ref 70–140)
Potassium: 3.8 mmol/L (ref 3.5–5.1)
Sodium: 138 mmol/L (ref 136–145)
Total Bilirubin: 0.3 mg/dL (ref 0.2–1.2)
Total Protein: 6.8 g/dL (ref 6.4–8.3)

## 2017-10-17 LAB — CBC WITH DIFFERENTIAL (CANCER CENTER ONLY)
BASOS ABS: 0 10*3/uL (ref 0.0–0.1)
Basophils Relative: 0 %
EOS PCT: 1 %
Eosinophils Absolute: 0.1 10*3/uL (ref 0.0–0.5)
HCT: 34.4 % — ABNORMAL LOW (ref 38.4–49.9)
Hemoglobin: 11 g/dL — ABNORMAL LOW (ref 13.0–17.1)
LYMPHS ABS: 1 10*3/uL (ref 0.9–3.3)
LYMPHS PCT: 10 %
MCH: 27.5 pg (ref 27.2–33.4)
MCHC: 32 g/dL (ref 32.0–36.0)
MCV: 86 fL (ref 79.3–98.0)
Monocytes Absolute: 1 10*3/uL — ABNORMAL HIGH (ref 0.1–0.9)
Monocytes Relative: 11 %
Neutro Abs: 7.4 10*3/uL — ABNORMAL HIGH (ref 1.5–6.5)
Neutrophils Relative %: 78 %
PLATELETS: 449 10*3/uL — AB (ref 140–400)
RBC: 4 MIL/uL — ABNORMAL LOW (ref 4.20–5.82)
RDW: 13.6 % (ref 11.0–14.6)
WBC Count: 9.4 10*3/uL (ref 4.0–10.3)

## 2017-10-17 MED ORDER — SODIUM CHLORIDE 0.9 % IV SOLN
Freq: Once | INTRAVENOUS | Status: AC
  Administered 2017-10-17: 08:00:00 via INTRAVENOUS

## 2017-10-17 MED ORDER — SODIUM CHLORIDE 0.9% FLUSH
10.0000 mL | INTRAVENOUS | Status: DC | PRN
  Administered 2017-10-17: 10 mL
  Filled 2017-10-17: qty 10

## 2017-10-17 MED ORDER — HEPARIN SOD (PORK) LOCK FLUSH 100 UNIT/ML IV SOLN
500.0000 [IU] | Freq: Once | INTRAVENOUS | Status: AC | PRN
  Administered 2017-10-17: 500 [IU]
  Filled 2017-10-17: qty 5

## 2017-10-17 MED ORDER — SODIUM CHLORIDE 0.9% FLUSH
10.0000 mL | INTRAVENOUS | Status: DC | PRN
  Filled 2017-10-17: qty 10

## 2017-10-17 MED ORDER — SODIUM CHLORIDE 0.9 % IV SOLN
175.0000 mg/m2 | Freq: Once | INTRAVENOUS | Status: AC
  Administered 2017-10-17: 354 mg via INTRAVENOUS
  Filled 2017-10-17: qty 59

## 2017-10-17 MED ORDER — PALONOSETRON HCL INJECTION 0.25 MG/5ML
INTRAVENOUS | Status: AC
  Filled 2017-10-17: qty 5

## 2017-10-17 MED ORDER — SODIUM CHLORIDE 0.9 % IV SOLN
Freq: Once | INTRAVENOUS | Status: AC
  Administered 2017-10-17: 09:00:00 via INTRAVENOUS
  Filled 2017-10-17: qty 5

## 2017-10-17 MED ORDER — DIPHENHYDRAMINE HCL 50 MG/ML IJ SOLN
INTRAMUSCULAR | Status: AC
  Filled 2017-10-17: qty 1

## 2017-10-17 MED ORDER — SODIUM CHLORIDE 0.9 % IV SOLN
514.5000 mg | Freq: Once | INTRAVENOUS | Status: AC
  Administered 2017-10-17: 510 mg via INTRAVENOUS
  Filled 2017-10-17: qty 51

## 2017-10-17 MED ORDER — PALONOSETRON HCL INJECTION 0.25 MG/5ML
0.2500 mg | Freq: Once | INTRAVENOUS | Status: AC
  Administered 2017-10-17: 0.25 mg via INTRAVENOUS

## 2017-10-17 MED ORDER — FAMOTIDINE IN NACL 20-0.9 MG/50ML-% IV SOLN
20.0000 mg | Freq: Once | INTRAVENOUS | Status: AC
  Administered 2017-10-17: 20 mg via INTRAVENOUS

## 2017-10-17 MED ORDER — DIPHENHYDRAMINE HCL 50 MG/ML IJ SOLN
50.0000 mg | Freq: Once | INTRAMUSCULAR | Status: AC
  Administered 2017-10-17: 50 mg via INTRAVENOUS

## 2017-10-17 MED ORDER — PROCHLORPERAZINE MALEATE 10 MG PO TABS: 10.0000 mg | ORAL_TABLET | Freq: Four times a day (QID) | ORAL | 0 refills | Status: DC | PRN

## 2017-10-17 MED ORDER — FAMOTIDINE IN NACL 20-0.9 MG/50ML-% IV SOLN
INTRAVENOUS | Status: AC
  Filled 2017-10-17: qty 50

## 2017-10-17 MED ORDER — SODIUM CHLORIDE 0.9 % IV SOLN
20.0000 mg | Freq: Once | INTRAVENOUS | Status: DC
  Filled 2017-10-17: qty 2

## 2017-10-17 NOTE — Progress Notes (Signed)
Dr. Alen Blew to send prescription to pharmacy for antiemetic.  Patient aware and educated.

## 2017-10-17 NOTE — Patient Instructions (Signed)
[facility] Discharge Instructions for Patients Receiving Chemotherapy  Today you received the following chemotherapy agents Taxol and Carboplatin.   To help prevent nausea and vomiting after your treatment, we encourage you to take your nausea medication as directed.   If you develop nausea and vomiting that is not controlled by your nausea medication, call the clinic.   BELOW ARE SYMPTOMS THAT SHOULD BE REPORTED IMMEDIATELY:  *FEVER GREATER THAN 100.5 F  *CHILLS WITH OR WITHOUT FEVER  NAUSEA AND VOMITING THAT IS NOT CONTROLLED WITH YOUR NAUSEA MEDICATION  *UNUSUAL SHORTNESS OF BREATH  *UNUSUAL BRUISING OR BLEEDING  TENDERNESS IN MOUTH AND THROAT WITH OR WITHOUT PRESENCE OF ULCERS  *URINARY PROBLEMS  *BOWEL PROBLEMS  UNUSUAL RASH Items with * indicate a potential emergency and should be followed up as soon as possible.  Feel free to call the clinic should you have any questions or concerns. The clinic phone number is [phone].  Please show the CHEMO ALERT CARD at check-in to the Emergency Department and triage nurse.   Paclitaxel injection What is this medicine? PACLITAXEL (PAK li TAX el) is a chemotherapy drug. It targets fast dividing cells, like cancer cells, and causes these cells to die. This medicine is used to treat ovarian cancer, breast cancer, and other cancers. This medicine may be used for other purposes; ask your health care provider or pharmacist if you have questions. COMMON BRAND NAME(S): Onxol, Taxol What should I tell my health care provider before I take this medicine? They need to know if you have any of these conditions: -blood disorders -irregular heartbeat -infection (especially a virus infection such as chickenpox, cold sores, or herpes) -liver disease -previous or ongoing radiation therapy -an unusual or allergic reaction to paclitaxel, alcohol, polyoxyethylated castor oil, other chemotherapy agents, other medicines, foods,  dyes, or preservatives -pregnant or trying to get pregnant -breast-feeding How should I use this medicine? This drug is given as an infusion into a vein. It is administered in a hospital or clinic by a specially trained health care professional. Talk to your pediatrician regarding the use of this medicine in children. Special care may be needed. Overdosage: If you think you have taken too much of this medicine contact a poison control center or emergency room at once. NOTE: This medicine is only for you. Do not share this medicine with others. What if I miss a dose? It is important not to miss your dose. Call your doctor or health care professional if you are unable to keep an appointment. What may interact with this medicine? Do not take this medicine with any of the following medications: -disulfiram -metronidazole This medicine may also interact with the following medications: -cyclosporine -diazepam -ketoconazole -medicines to increase blood counts like filgrastim, pegfilgrastim, sargramostim -other chemotherapy drugs like cisplatin, doxorubicin, epirubicin, etoposide, teniposide, vincristine -quinidine -testosterone -vaccines -verapamil Talk to your doctor or health care professional before taking any of these medicines: -acetaminophen -aspirin -ibuprofen -ketoprofen -naproxen This list may not describe all possible interactions. Give your health care provider a list of all the medicines, herbs, non-prescription drugs, or dietary supplements you use. Also tell them if you smoke, drink alcohol, or use illegal drugs. Some items may interact with your medicine. What should I watch for while using this medicine? Your condition will be monitored carefully while you are receiving this medicine. You will need important blood work done while you are taking this medicine. This medicine can cause serious allergic reactions. To reduce your risk you   will need to take other medicine(s)  before treatment with this medicine. If you experience allergic reactions like skin rash, itching or hives, swelling of the face, lips, or tongue, tell your doctor or health care professional right away. In some cases, you may be given additional medicines to help with side effects. Follow all directions for their use. This drug may make you feel generally unwell. This is not uncommon, as chemotherapy can affect healthy cells as well as cancer cells. Report any side effects. Continue your course of treatment even though you feel ill unless your doctor tells you to stop. Call your doctor or health care professional for advice if you get a fever, chills or sore throat, or other symptoms of a cold or flu. Do not treat yourself. This drug decreases your body's ability to fight infections. Try to avoid being around people who are sick. This medicine may increase your risk to bruise or bleed. Call your doctor or health care professional if you notice any unusual bleeding. Be careful brushing and flossing your teeth or using a toothpick because you may get an infection or bleed more easily. If you have any dental work done, tell your dentist you are receiving this medicine. Avoid taking products that contain aspirin, acetaminophen, ibuprofen, naproxen, or ketoprofen unless instructed by your doctor. These medicines may hide a fever. Do not become pregnant while taking this medicine. Women should inform their doctor if they wish to become pregnant or think they might be pregnant. There is a potential for serious side effects to an unborn child. Talk to your health care professional or pharmacist for more information. Do not breast-feed an infant while taking this medicine. Men are advised not to father a child while receiving this medicine. This product may contain alcohol. Ask your pharmacist or healthcare provider if this medicine contains alcohol. Be sure to tell all healthcare providers you are taking this  medicine. Certain medicines, like metronidazole and disulfiram, can cause an unpleasant reaction when taken with alcohol. The reaction includes flushing, headache, nausea, vomiting, sweating, and increased thirst. The reaction can last from 30 minutes to several hours. What side effects may I notice from receiving this medicine? Side effects that you should report to your doctor or health care professional as soon as possible: -allergic reactions like skin rash, itching or hives, swelling of the face, lips, or tongue -low blood counts - This drug may decrease the number of white blood cells, red blood cells and platelets. You may be at increased risk for infections and bleeding. -signs of infection - fever or chills, cough, sore throat, pain or difficulty passing urine -signs of decreased platelets or bleeding - bruising, pinpoint red spots on the skin, black, tarry stools, nosebleeds -signs of decreased red blood cells - unusually weak or tired, fainting spells, lightheadedness -breathing problems -chest pain -high or low blood pressure -mouth sores -nausea and vomiting -pain, swelling, redness or irritation at the injection site -pain, tingling, numbness in the hands or feet -slow or irregular heartbeat -swelling of the ankle, feet, hands Side effects that usually do not require medical attention (report to your doctor or health care professional if they continue or are bothersome): -bone pain -complete hair loss including hair on your head, underarms, pubic hair, eyebrows, and eyelashes -changes in the color of fingernails -diarrhea -loosening of the fingernails -loss of appetite -muscle or joint pain -red flush to skin -sweating This list may not describe all possible side effects. Call your doctor for   medical advice about side effects. You may report side effects to FDA at 1-800-FDA-1088. Where should I keep my medicine? This drug is given in a hospital or clinic and will not be  stored at home. NOTE: This sheet is a summary. It may not cover all possible information. If you have questions about this medicine, talk to your doctor, pharmacist, or health care provider.  2018 Elsevier/Gold Standard (2015-02-24 19:58:00)   Carboplatin injection What is this medicine? CARBOPLATIN (KAR boe pla tin) is a chemotherapy drug. It targets fast dividing cells, like cancer cells, and causes these cells to die. This medicine is used to treat ovarian cancer and many other cancers. This medicine may be used for other purposes; ask your health care provider or pharmacist if you have questions. COMMON BRAND NAME(S): Paraplatin What should I tell my health care provider before I take this medicine? They need to know if you have any of these conditions: -blood disorders -hearing problems -kidney disease -recent or ongoing radiation therapy -an unusual or allergic reaction to carboplatin, cisplatin, other chemotherapy, other medicines, foods, dyes, or preservatives -pregnant or trying to get pregnant -breast-feeding How should I use this medicine? This drug is usually given as an infusion into a vein. It is administered in a hospital or clinic by a specially trained health care professional. Talk to your pediatrician regarding the use of this medicine in children. Special care may be needed. Overdosage: If you think you have taken too much of this medicine contact a poison control center or emergency room at once. NOTE: This medicine is only for you. Do not share this medicine with others. What if I miss a dose? It is important not to miss a dose. Call your doctor or health care professional if you are unable to keep an appointment. What may interact with this medicine? -medicines for seizures -medicines to increase blood counts like filgrastim, pegfilgrastim, sargramostim -some antibiotics like amikacin, gentamicin, neomycin, streptomycin, tobramycin -vaccines Talk to your doctor  or health care professional before taking any of these medicines: -acetaminophen -aspirin -ibuprofen -ketoprofen -naproxen This list may not describe all possible interactions. Give your health care provider a list of all the medicines, herbs, non-prescription drugs, or dietary supplements you use. Also tell them if you smoke, drink alcohol, or use illegal drugs. Some items may interact with your medicine. What should I watch for while using this medicine? Your condition will be monitored carefully while you are receiving this medicine. You will need important blood work done while you are taking this medicine. This drug may make you feel generally unwell. This is not uncommon, as chemotherapy can affect healthy cells as well as cancer cells. Report any side effects. Continue your course of treatment even though you feel ill unless your doctor tells you to stop. In some cases, you may be given additional medicines to help with side effects. Follow all directions for their use. Call your doctor or health care professional for advice if you get a fever, chills or sore throat, or other symptoms of a cold or flu. Do not treat yourself. This drug decreases your body's ability to fight infections. Try to avoid being around people who are sick. This medicine may increase your risk to bruise or bleed. Call your doctor or health care professional if you notice any unusual bleeding. Be careful brushing and flossing your teeth or using a toothpick because you may get an infection or bleed more easily. If you have any dental work done,   tell your dentist you are receiving this medicine. Avoid taking products that contain aspirin, acetaminophen, ibuprofen, naproxen, or ketoprofen unless instructed by your doctor. These medicines may hide a fever. Do not become pregnant while taking this medicine. Women should inform their doctor if they wish to become pregnant or think they might be pregnant. There is a potential  for serious side effects to an unborn child. Talk to your health care professional or pharmacist for more information. Do not breast-feed an infant while taking this medicine. What side effects may I notice from receiving this medicine? Side effects that you should report to your doctor or health care professional as soon as possible: -allergic reactions like skin rash, itching or hives, swelling of the face, lips, or tongue -signs of infection - fever or chills, cough, sore throat, pain or difficulty passing urine -signs of decreased platelets or bleeding - bruising, pinpoint red spots on the skin, black, tarry stools, nosebleeds -signs of decreased red blood cells - unusually weak or tired, fainting spells, lightheadedness -breathing problems -changes in hearing -changes in vision -chest pain -high blood pressure -low blood counts - This drug may decrease the number of white blood cells, red blood cells and platelets. You may be at increased risk for infections and bleeding. -nausea and vomiting -pain, swelling, redness or irritation at the injection site -pain, tingling, numbness in the hands or feet -problems with balance, talking, walking -trouble passing urine or change in the amount of urine Side effects that usually do not require medical attention (report to your doctor or health care professional if they continue or are bothersome): -hair loss -loss of appetite -metallic taste in the mouth or changes in taste This list may not describe all possible side effects. Call your doctor for medical advice about side effects. You may report side effects to FDA at 1-800-FDA-1088. Where should I keep my medicine? This drug is given in a hospital or clinic and will not be stored at home. NOTE: This sheet is a summary. It may not cover all possible information. If you have questions about this medicine, talk to your doctor, pharmacist, or health care provider.  2018 Elsevier/Gold Standard  (2007-07-31 14:38:05)  

## 2017-10-17 NOTE — Progress Notes (Signed)
Pt was accessed in Infusion room.

## 2017-10-18 ENCOUNTER — Telehealth: Payer: Self-pay | Admitting: *Deleted

## 2017-10-18 ENCOUNTER — Inpatient Hospital Stay: Payer: PPO

## 2017-10-18 DIAGNOSIS — C61 Malignant neoplasm of prostate: Secondary | ICD-10-CM

## 2017-10-18 MED ORDER — PEGFILGRASTIM-CBQV 6 MG/0.6ML ~~LOC~~ SOSY
6.0000 mg | PREFILLED_SYRINGE | Freq: Once | SUBCUTANEOUS | Status: AC
  Administered 2017-10-18: 6 mg via SUBCUTANEOUS

## 2017-10-18 MED ORDER — PEGFILGRASTIM-CBQV 6 MG/0.6ML ~~LOC~~ SOSY
PREFILLED_SYRINGE | SUBCUTANEOUS | Status: AC
  Filled 2017-10-18: qty 0.6

## 2017-10-18 NOTE — Telephone Encounter (Signed)
Called patient to follow up after first chemotherapy treatment. Patient stated,"I've had some diarrhea and I haven't eaten today. I'm drinking fluids and I have my antinausea medicine now. Instructed patient to take Imodium after each loose bowel movement. Also, call the office this week if he feels like he needs to get IV fluids. Patient verbalized understanding.

## 2017-10-18 NOTE — Patient Instructions (Signed)
Pegfilgrastim injection What is this medicine? PEGFILGRASTIM (PEG fil gra stim) is a long-acting granulocyte colony-stimulating factor that stimulates the growth of neutrophils, a type of white blood cell important in the body's fight against infection. It is used to reduce the incidence of fever and infection in patients with certain types of cancer who are receiving chemotherapy that affects the bone marrow, and to increase survival after being exposed to high doses of radiation. This medicine may be used for other purposes; ask your health care provider or pharmacist if you have questions. COMMON BRAND NAME(S): Neulasta What should I tell my health care provider before I take this medicine? They need to know if you have any of these conditions: -kidney disease -latex allergy -ongoing radiation therapy -sickle cell disease -skin reactions to acrylic adhesives (On-Body Injector only) -an unusual or allergic reaction to pegfilgrastim, filgrastim, other medicines, foods, dyes, or preservatives -pregnant or trying to get pregnant -breast-feeding How should I use this medicine? This medicine is for injection under the skin. If you get this medicine at home, you will be taught how to prepare and give the pre-filled syringe or how to use the On-body Injector. Refer to the patient Instructions for Use for detailed instructions. Use exactly as directed. Tell your healthcare provider immediately if you suspect that the On-body Injector may not have performed as intended or if you suspect the use of the On-body Injector resulted in a missed or partial dose. It is important that you put your used needles and syringes in a special sharps container. Do not put them in a trash can. If you do not have a sharps container, call your pharmacist or healthcare provider to get one. Talk to your pediatrician regarding the use of this medicine in children. While this drug may be prescribed for selected conditions,  precautions do apply. Overdosage: If you think you have taken too much of this medicine contact a poison control center or emergency room at once. NOTE: This medicine is only for you. Do not share this medicine with others. What if I miss a dose? It is important not to miss your dose. Call your doctor or health care professional if you miss your dose. If you miss a dose due to an On-body Injector failure or leakage, a new dose should be administered as soon as possible using a single prefilled syringe for manual use. What may interact with this medicine? Interactions have not been studied. Give your health care provider a list of all the medicines, herbs, non-prescription drugs, or dietary supplements you use. Also tell them if you smoke, drink alcohol, or use illegal drugs. Some items may interact with your medicine. This list may not describe all possible interactions. Give your health care provider a list of all the medicines, herbs, non-prescription drugs, or dietary supplements you use. Also tell them if you smoke, drink alcohol, or use illegal drugs. Some items may interact with your medicine. What should I watch for while using this medicine? You may need blood work done while you are taking this medicine. If you are going to need a MRI, CT scan, or other procedure, tell your doctor that you are using this medicine (On-Body Injector only). What side effects may I notice from receiving this medicine? Side effects that you should report to your doctor or health care professional as soon as possible: -allergic reactions like skin rash, itching or hives, swelling of the face, lips, or tongue -dizziness -fever -pain, redness, or irritation at site   where injected -pinpoint red spots on the skin -red or dark-brown urine -shortness of breath or breathing problems -stomach or side pain, or pain at the shoulder -swelling -tiredness -trouble passing urine or change in the amount of urine Side  effects that usually do not require medical attention (report to your doctor or health care professional if they continue or are bothersome): -bone pain -muscle pain This list may not describe all possible side effects. Call your doctor for medical advice about side effects. You may report side effects to FDA at 1-800-FDA-1088. Where should I keep my medicine? Keep out of the reach of children. Store pre-filled syringes in a refrigerator between 2 and 8 degrees C (36 and 46 degrees F). Do not freeze. Keep in carton to protect from light. Throw away this medicine if it is left out of the refrigerator for more than 48 hours. Throw away any unused medicine after the expiration date. NOTE: This sheet is a summary. It may not cover all possible information. If you have questions about this medicine, talk to your doctor, pharmacist, or health care provider.  2018 [employer]vier/Gold Standard (2016-04-21 12:58:03)  

## 2017-10-18 NOTE — Telephone Encounter (Signed)
-----   Message from Rennis Harding, RN sent at 10/17/2017  3:42 PM EDT ----- Regarding: Dr. Alen Blew 1st time chemo f/u Dr. Alen Blew 1st time chemo f/u

## 2017-10-20 ENCOUNTER — Telehealth: Payer: Self-pay | Admitting: *Deleted

## 2017-10-20 ENCOUNTER — Other Ambulatory Visit: Payer: Self-pay | Admitting: Oncology

## 2017-10-20 MED ORDER — HYDROCODONE-ACETAMINOPHEN 5-325 MG PO TABS: 1.0000 | ORAL_TABLET | ORAL | 0 refills | Status: DC | PRN

## 2017-10-20 NOTE — Telephone Encounter (Signed)
Tried calling patient back but number is still busy. Will try later.

## 2017-10-20 NOTE — Telephone Encounter (Signed)
Tried calling patient back but phone is busy.

## 2017-10-22 ENCOUNTER — Other Ambulatory Visit: Payer: Self-pay | Admitting: Oncology

## 2017-10-23 ENCOUNTER — Inpatient Hospital Stay (HOSPITAL_BASED_OUTPATIENT_CLINIC_OR_DEPARTMENT_OTHER): Payer: PPO | Admitting: Medical

## 2017-10-23 ENCOUNTER — Inpatient Hospital Stay (HOSPITAL_COMMUNITY)
Admission: EM | Admit: 2017-10-23 | Discharge: 2017-10-26 | DRG: 393 | Disposition: A | Discharge status: Home / Self Care | Payer: PPO | Attending: Internal Medicine | Admitting: Internal Medicine

## 2017-10-23 ENCOUNTER — Other Ambulatory Visit: Payer: Self-pay

## 2017-10-23 ENCOUNTER — Telehealth: Payer: Self-pay | Admitting: *Deleted

## 2017-10-23 ENCOUNTER — Encounter (HOSPITAL_COMMUNITY): Payer: Self-pay | Admitting: Obstetrics and Gynecology

## 2017-10-23 ENCOUNTER — Emergency Department (HOSPITAL_COMMUNITY): Payer: PPO

## 2017-10-23 VITALS — BP 108/74 | HR 105 | Temp 97.8°F | Resp 18 | Ht 70.5 in | Wt 162.6 lb

## 2017-10-23 DIAGNOSIS — E1165 Type 2 diabetes mellitus with hyperglycemia: Secondary | ICD-10-CM | POA: Diagnosis not present

## 2017-10-23 DIAGNOSIS — E1140 Type 2 diabetes mellitus with diabetic neuropathy, unspecified: Secondary | ICD-10-CM | POA: Diagnosis present

## 2017-10-23 DIAGNOSIS — Z87891 Personal history of nicotine dependence: Secondary | ICD-10-CM

## 2017-10-23 DIAGNOSIS — K521 Toxic gastroenteritis and colitis: Secondary | ICD-10-CM | POA: Diagnosis not present

## 2017-10-23 DIAGNOSIS — Z8546 Personal history of malignant neoplasm of prostate: Secondary | ICD-10-CM

## 2017-10-23 DIAGNOSIS — D61818 Other pancytopenia: Secondary | ICD-10-CM | POA: Diagnosis not present

## 2017-10-23 DIAGNOSIS — L299 Pruritus, unspecified: Secondary | ICD-10-CM | POA: Diagnosis not present

## 2017-10-23 DIAGNOSIS — E785 Hyperlipidemia, unspecified: Secondary | ICD-10-CM | POA: Diagnosis not present

## 2017-10-23 DIAGNOSIS — C61 Malignant neoplasm of prostate: Secondary | ICD-10-CM | POA: Diagnosis not present

## 2017-10-23 DIAGNOSIS — C786 Secondary malignant neoplasm of retroperitoneum and peritoneum: Secondary | ICD-10-CM | POA: Diagnosis not present

## 2017-10-23 DIAGNOSIS — E876 Hypokalemia: Secondary | ICD-10-CM | POA: Diagnosis not present

## 2017-10-23 DIAGNOSIS — R109 Unspecified abdominal pain: Secondary | ICD-10-CM | POA: Diagnosis not present

## 2017-10-23 DIAGNOSIS — N400 Enlarged prostate without lower urinary tract symptoms: Secondary | ICD-10-CM | POA: Diagnosis present

## 2017-10-23 DIAGNOSIS — E860 Dehydration: Secondary | ICD-10-CM | POA: Diagnosis present

## 2017-10-23 DIAGNOSIS — Z809 Family history of malignant neoplasm, unspecified: Secondary | ICD-10-CM

## 2017-10-23 DIAGNOSIS — Z8249 Family history of ischemic heart disease and other diseases of the circulatory system: Secondary | ICD-10-CM

## 2017-10-23 DIAGNOSIS — T451X5A Adverse effect of antineoplastic and immunosuppressive drugs, initial encounter: Secondary | ICD-10-CM | POA: Diagnosis present

## 2017-10-23 DIAGNOSIS — Z85820 Personal history of malignant melanoma of skin: Secondary | ICD-10-CM

## 2017-10-23 DIAGNOSIS — E43 Unspecified severe protein-calorie malnutrition: Secondary | ICD-10-CM | POA: Diagnosis not present

## 2017-10-23 DIAGNOSIS — K529 Noninfective gastroenteritis and colitis, unspecified: Secondary | ICD-10-CM | POA: Diagnosis not present

## 2017-10-23 DIAGNOSIS — Z9079 Acquired absence of other genital organ(s): Secondary | ICD-10-CM

## 2017-10-23 DIAGNOSIS — K219 Gastro-esophageal reflux disease without esophagitis: Secondary | ICD-10-CM | POA: Diagnosis present

## 2017-10-23 DIAGNOSIS — Z974 Presence of external hearing-aid: Secondary | ICD-10-CM | POA: Diagnosis not present

## 2017-10-23 DIAGNOSIS — Z91013 Allergy to seafood: Secondary | ICD-10-CM

## 2017-10-23 DIAGNOSIS — N189 Chronic kidney disease, unspecified: Secondary | ICD-10-CM | POA: Diagnosis not present

## 2017-10-23 DIAGNOSIS — Z923 Personal history of irradiation: Secondary | ICD-10-CM

## 2017-10-23 DIAGNOSIS — H9190 Unspecified hearing loss, unspecified ear: Secondary | ICD-10-CM | POA: Diagnosis present

## 2017-10-23 DIAGNOSIS — N179 Acute kidney failure, unspecified: Secondary | ICD-10-CM | POA: Diagnosis present

## 2017-10-23 DIAGNOSIS — R188 Other ascites: Secondary | ICD-10-CM | POA: Diagnosis present

## 2017-10-23 DIAGNOSIS — D6481 Anemia due to antineoplastic chemotherapy: Secondary | ICD-10-CM | POA: Diagnosis present

## 2017-10-23 DIAGNOSIS — Z5189 Encounter for other specified aftercare: Secondary | ICD-10-CM | POA: Diagnosis not present

## 2017-10-23 DIAGNOSIS — Z79899 Other long term (current) drug therapy: Secondary | ICD-10-CM

## 2017-10-23 DIAGNOSIS — J90 Pleural effusion, not elsewhere classified: Secondary | ICD-10-CM | POA: Diagnosis not present

## 2017-10-23 DIAGNOSIS — R634 Abnormal weight loss: Secondary | ICD-10-CM | POA: Diagnosis not present

## 2017-10-23 DIAGNOSIS — Z9221 Personal history of antineoplastic chemotherapy: Secondary | ICD-10-CM | POA: Diagnosis not present

## 2017-10-23 DIAGNOSIS — D649 Anemia, unspecified: Secondary | ICD-10-CM | POA: Diagnosis not present

## 2017-10-23 DIAGNOSIS — K567 Ileus, unspecified: Secondary | ICD-10-CM

## 2017-10-23 DIAGNOSIS — Z881 Allergy status to other antibiotic agents status: Secondary | ICD-10-CM

## 2017-10-23 DIAGNOSIS — R197 Diarrhea, unspecified: Secondary | ICD-10-CM | POA: Diagnosis not present

## 2017-10-23 DIAGNOSIS — Z7984 Long term (current) use of oral hypoglycemic drugs: Secondary | ICD-10-CM | POA: Diagnosis not present

## 2017-10-23 DIAGNOSIS — Z6824 Body mass index (BMI) 24.0-24.9, adult: Secondary | ICD-10-CM

## 2017-10-23 DIAGNOSIS — Z5111 Encounter for antineoplastic chemotherapy: Secondary | ICD-10-CM | POA: Diagnosis not present

## 2017-10-23 DIAGNOSIS — E119 Type 2 diabetes mellitus without complications: Secondary | ICD-10-CM | POA: Diagnosis not present

## 2017-10-23 DIAGNOSIS — E46 Unspecified protein-calorie malnutrition: Secondary | ICD-10-CM | POA: Diagnosis not present

## 2017-10-23 LAB — CBC WITH DIFFERENTIAL/PLATELET
BASOS PCT: 0 %
Basophils Absolute: 0 10*3/uL (ref 0.0–0.1)
EOS ABS: 0 10*3/uL (ref 0.0–0.7)
Eosinophils Relative: 1 %
HCT: 30 % — ABNORMAL LOW (ref 39.0–52.0)
Hemoglobin: 9.9 g/dL — ABNORMAL LOW (ref 13.0–17.0)
LYMPHS ABS: 0.5 10*3/uL — AB (ref 0.7–4.0)
Lymphocytes Relative: 12 %
MCH: 27.7 pg (ref 26.0–34.0)
MCHC: 33 g/dL (ref 30.0–36.0)
MCV: 83.8 fL (ref 78.0–100.0)
MONO ABS: 0.5 10*3/uL (ref 0.1–1.0)
Monocytes Relative: 12 %
NEUTROS ABS: 2.8 10*3/uL (ref 1.7–7.7)
NEUTROS PCT: 75 %
PLATELETS: 293 10*3/uL (ref 150–400)
RBC: 3.58 MIL/uL — ABNORMAL LOW (ref 4.22–5.81)
RDW: 13.6 % (ref 11.5–15.5)
WBC: 3.8 10*3/uL — ABNORMAL LOW (ref 4.0–10.5)

## 2017-10-23 LAB — COMPREHENSIVE METABOLIC PANEL
ALBUMIN: 3.1 g/dL — AB (ref 3.5–5.0)
ALK PHOS: 60 U/L (ref 38–126)
ALT: 25 U/L (ref 17–63)
ANION GAP: 14 (ref 5–15)
AST: 29 U/L (ref 15–41)
BILIRUBIN TOTAL: 0.8 mg/dL (ref 0.3–1.2)
BUN: 41 mg/dL — AB (ref 6–20)
CALCIUM: 8.6 mg/dL — AB (ref 8.9–10.3)
CO2: 22 mmol/L (ref 22–32)
Chloride: 100 mmol/L — ABNORMAL LOW (ref 101–111)
Creatinine, Ser: 1.47 mg/dL — ABNORMAL HIGH (ref 0.61–1.24)
GFR calc Af Amer: 53 mL/min — ABNORMAL LOW (ref >60)
GFR calc non Af Amer: 46 mL/min — ABNORMAL LOW (ref >60)
GLUCOSE: 164 mg/dL — AB (ref 65–99)
Potassium: 2.9 mmol/L — ABNORMAL LOW (ref 3.5–5.1)
Sodium: 136 mmol/L (ref 135–145)
TOTAL PROTEIN: 6.5 g/dL (ref 6.5–8.1)

## 2017-10-23 LAB — GLUCOSE, CAPILLARY: GLUCOSE-CAPILLARY: 88 mg/dL (ref 65–99)

## 2017-10-23 LAB — URINALYSIS, ROUTINE W REFLEX MICROSCOPIC
Bilirubin Urine: NEGATIVE
GLUCOSE, UA: NEGATIVE mg/dL
HGB URINE DIPSTICK: NEGATIVE
KETONES UR: NEGATIVE mg/dL
Leukocytes, UA: NEGATIVE
Nitrite: NEGATIVE
Protein, ur: NEGATIVE mg/dL
Specific Gravity, Urine: 1.023 (ref 1.005–1.030)
pH: 5 (ref 5.0–8.0)

## 2017-10-23 LAB — MAGNESIUM: Magnesium: 1.2 mg/dL — ABNORMAL LOW (ref 1.7–2.4)

## 2017-10-23 LAB — POC OCCULT BLOOD, ED: Fecal Occult Bld: NEGATIVE

## 2017-10-23 LAB — LIPASE, BLOOD: Lipase: 27 U/L (ref 11–51)

## 2017-10-23 MED ORDER — SODIUM CHLORIDE 0.9 % IV SOLN
INTRAVENOUS | Status: DC
  Administered 2017-10-24: via INTRAVENOUS

## 2017-10-23 MED ORDER — FENTANYL CITRATE (PF) 100 MCG/2ML IJ SOLN: 25.0000 ug | INTRAMUSCULAR | Status: DC | PRN

## 2017-10-23 MED ORDER — POTASSIUM CHLORIDE 10 MEQ/100ML IV SOLN
10.0000 meq | Freq: Once | INTRAVENOUS | Status: AC
  Administered 2017-10-23: 10 meq via INTRAVENOUS
  Filled 2017-10-23: qty 100

## 2017-10-23 MED ORDER — POTASSIUM CHLORIDE 10 MEQ/100ML IV SOLN
10.0000 meq | INTRAVENOUS | Status: AC
  Administered 2017-10-24 (×4): 10 meq via INTRAVENOUS
  Filled 2017-10-23: qty 100

## 2017-10-23 MED ORDER — ONDANSETRON HCL 4 MG/2ML IJ SOLN: 4.0000 mg | Freq: Four times a day (QID) | INTRAMUSCULAR | Status: DC | PRN

## 2017-10-23 MED ORDER — MAGNESIUM SULFATE 2 GM/50ML IV SOLN
2.0000 g | Freq: Once | INTRAVENOUS | Status: AC
  Administered 2017-10-24: 2 g via INTRAVENOUS
  Filled 2017-10-23: qty 50

## 2017-10-23 MED ORDER — CIPROFLOXACIN IN D5W 400 MG/200ML IV SOLN
400.0000 mg | Freq: Two times a day (BID) | INTRAVENOUS | Status: DC
  Administered 2017-10-24 – 2017-10-25 (×4): 400 mg via INTRAVENOUS
  Filled 2017-10-23 (×4): qty 200

## 2017-10-23 MED ORDER — SODIUM CHLORIDE 0.9 % IV SOLN
Freq: Once | INTRAVENOUS | Status: AC
  Administered 2017-10-23: 17:00:00 via INTRAVENOUS

## 2017-10-23 MED ORDER — ACETAMINOPHEN 650 MG RE SUPP: 650.0000 mg | Freq: Four times a day (QID) | RECTAL | Status: DC | PRN

## 2017-10-23 MED ORDER — METRONIDAZOLE IN NACL 5-0.79 MG/ML-% IV SOLN
500.0000 mg | Freq: Three times a day (TID) | INTRAVENOUS | Status: DC
  Administered 2017-10-24 – 2017-10-25 (×5): 500 mg via INTRAVENOUS
  Filled 2017-10-23 (×5): qty 100

## 2017-10-23 MED ORDER — SODIUM CHLORIDE 0.9 % IV BOLUS
1000.0000 mL | Freq: Once | INTRAVENOUS | Status: AC
  Administered 2017-10-23: 1000 mL via INTRAVENOUS

## 2017-10-23 MED ORDER — ACETAMINOPHEN 325 MG PO TABS: 650.0000 mg | ORAL_TABLET | Freq: Four times a day (QID) | ORAL | Status: DC | PRN

## 2017-10-23 MED ORDER — SODIUM CHLORIDE 0.9% FLUSH
10.0000 mL | INTRAVENOUS | Status: DC | PRN
  Administered 2017-10-26: 10 mL
  Filled 2017-10-23: qty 40

## 2017-10-23 MED ORDER — ONDANSETRON HCL 4 MG PO TABS: 4.0000 mg | ORAL_TABLET | Freq: Four times a day (QID) | ORAL | Status: DC | PRN

## 2017-10-23 MED ORDER — ALBUTEROL SULFATE (2.5 MG/3ML) 0.083% IN NEBU: 2.5000 mg | INHALATION_SOLUTION | Freq: Four times a day (QID) | RESPIRATORY_TRACT | Status: DC | PRN

## 2017-10-23 MED ORDER — IOPAMIDOL (ISOVUE-300) INJECTION 61%
100.0000 mL | Freq: Once | INTRAVENOUS | Status: AC | PRN
  Administered 2017-10-23: 100 mL via INTRAVENOUS

## 2017-10-23 NOTE — Telephone Encounter (Signed)
Spoke with patient regarding pain management and diarrhea x four days. Instructed him to come in [facility] to see Sandi Mealy, PA. Or he could go to the ER and be seen there. Informed him that Dr. Alen Blew is out of the office on Monday. He and his wife verbalized understanding and will come to [facility] today. LOS sent to scheduling.

## 2017-10-23 NOTE — Progress Notes (Signed)
[facility] Progress Note   Frank Larsen [id number] 01/24/1946 72 y.o.  Frank Larsen is managed by Dr. Alen Blew  Actively treated with chemotherapy/immunotherapy: yes  Current Therapy: Carboplatin and paclitaxel  Last Treated: 10/17/2017 (cycle 1, day 1)  Assessment: Plan:    Prostate CA ([place]) - Plan: 0.9 %  sodium chloride infusion  Diarrhea, unspecified type   Prostate cancer: The patient is status post cycle 1, day 1 of carboplatin and paclitaxel which was dosed on 10/17/2017.  Diarrhea: The patient was taken to the emergency room at [facility] for evaluation and management of his diarrhea.  Please see After Visit Summary for patient specific instructions.  Future Appointments  Date Time Provider [place]  10/25/2017  9:30 AM Dannielle Karvonen, RN [facility] None  10/30/2017  7:45 AM [facility] LAB 4 [facility] None  10/30/2017  8:00 AM [facility] INJ NURSE [facility] None  10/30/2017  8:30 AM Wyatt Portela, MD [facility] None  11/07/2017  8:00 AM [facility] LAB 5 [facility] None  11/07/2017  8:15 AM [facility] FLUSH NURSE [facility] None  11/07/2017  9:00 AM [facility] B4 [facility] None  11/08/2017  2:45 PM [facility] FLUSH NURSE 2 [facility] None  11/28/2017  8:15 AM [facility] LAB 2 [facility] None  11/28/2017  8:30 AM [facility] [place] FLUSH [facility] None  11/28/2017  9:15 AM Shadad, Mathis Dad, MD [facility] None  11/28/2017 10:15 AM [facility] INFUSION [facility] None  11/29/2017  3:30 PM [facility] [place] None  02/05/2018  8:00 AM Philemon Kingdom, MD [facility] None    No orders of the defined types were placed in this encounter.      Subjective:   Patient ID:  Frank Larsen is a 72 y.o. (DOB 1946/02/17) male.  Chief Complaint: No chief complaint on file.   HPI Frank Larsen is a 73 year old male with advanced prostate cancer who was initially diagnosed in 2013 and who was noted to have advanced  disease in 2017.  He is managed by Dr. Alen Blew and is status post cycle 1, day 1 of carboplatin and paclitaxel which was dosed on 10/17/2017.  He presents to the office today with diarrhea since his treatment last week.  He has had at least 7 episodes of watery diarrhea today and has had 4 episodes since presenting to the clinic this afternoon.  Unfortunately, no labs had been ordered on the patient prior to his appointment today.  Based on his significant weakness, abdominal distention and pain he will be transported to the emergency room for evaluation and management.  He has taken 3 doses of Imodium today.  His last dose was at around 11:00 this morning.  Medications: I have reviewed the patient's current medications.  Allergies:  Allergies  Allergen Reactions  . Scallops [Shellfish Allergy] Nausea And Vomiting  . Cephalexin Hives and Rash    Past Medical History:  Diagnosis Date  . AK (actinic keratosis)   . Arthritis   . BPH (benign prostatic hyperplasia)   . Chronic low back pain    since 1979  . Diabetes mellitus [facility]   type 2  . Elevated PSA   . GERD (gastroesophageal reflux disease)   . Hearing loss    using bilateral hearing aids  . History of radiation therapy 01/11/14- 03/04/14   prostate bed 6600 cGy 33 sessions  . Hypercholesterolemia   . Hypercholesterolemia   . Hyperlipidemia   . Impaired hearing BILATERAL HEARING AIDS   20% RIGHT HEARING  DUE TO VIRUS  . Melanoma ([place])   . Normal cardiac stress test 2010  . Prostate cancer ([place]) 07/20/11   Gleason 9  . Prostate cancer ([place])   . Prostate nodule   . Skin cancer    malignant melanoma of skin   . Tinnitus    stable since 1992  . Tobacco use     Past Surgical History:  Procedure Laterality Date  . APPENDECTOMY  AGE 39  . CARPOMETACARPAL JOINT ARTHROTOMY  03-22-2006   RIGHT THUMB  . CARPOMETACARPAL JOINT ARTHROTOMY  12-21-2005   LEFT THUMB  . IR GENERIC HISTORICAL  03/22/2016   IR US GUIDE VASC ACCESS  RIGHT [facility]  . IR GENERIC HISTORICAL  03/22/2016   IR FLUORO GUIDE PORT INSERTION RIGHT [facility]  . MELANOMA EXCISION  07-12-10   RIGHT NECK  . PROSTATE BIOPSY  07/20/2011   Procedure: BIOPSY TRANSRECTAL ULTRASONIC PROSTATE (TUBP);  Surgeon: Molli Hazard, MD;  Location: [facility];  Service: Urology;  Laterality: N/A;  1 hour requested for this case  Saturation BX  OFFICE TO BRING ULTRASOUND MACHINE    . ROBOT ASSISTED LAPAROSCOPIC RADICAL PROSTATECTOMY  10/19/2011   Procedure: ROBOTIC ASSISTED LAPAROSCOPIC RADICAL PROSTATECTOMY;  Surgeon: Molli Hazard, MD;  Location: [facility];  Service: Urology;  Laterality: N/A;      . SHOULDER OPEN ROTATOR CUFF REPAIR  07-29-2010- RIGHT   AND SUBACROMIAL DECOMPRESSION AROMIOPLASTY  . TONSILLECTOMY  AGE 59    Family History  Problem Relation Age of Onset  . Cancer Mother        breast/lived 39 years post  . Alzheimer's disease Mother        deceased  . Cancer Brother        thyroid  . Heart disease Brother   . Stroke Father        24 years old  . Hypertension Father   . Alcoholism Father   . CVA Father   . Cirrhosis Paternal Grandfather        alcohol-related  . Cancer Paternal Grandmother        unknown type     Social History   Socioeconomic History  . Marital status: Married    Spouse name: Not on file  . Number of children: Not on file  . Years of education: Not on file  . Highest education level: Not on file  Occupational History  . Occupation: Retired    Comment: Public relations account executive  Social Needs  . Financial resource strain: Not on file  . Food insecurity:    Worry: Not on file    Inability: Not on file  . Transportation needs:    Medical: Not on file    Non-medical: Not on file  Tobacco Use  . Smoking status: Former Smoker    Packs/day: 1.00    Years: 43.00    Pack years: 43.00    Types: Cigarettes    Last attempt to quit: 12/07/2007    Years since quitting: 9.8  .  Smokeless tobacco: Never Used  Substance and Sexual Activity  . Alcohol use: Yes    Comment: OCCASIONAL  . Drug use: No  . Sexual activity: Not on file  Lifestyle  . Physical activity:    Days per week: Not on file    Minutes per session: Not on file  . Stress: Not on file  Relationships  . Social connections:    Talks on phone: Not on file  Gets together: Not on file    Attends religious service: Not on file    Active member of club or organization: Not on file    Attends meetings of clubs or organizations: Not on file    Relationship status: Not on file  . Intimate partner violence:    Fear of current or ex partner: Not on file    Emotionally abused: Not on file    Physically abused: Not on file    Forced sexual activity: Not on file  Other Topics Concern  . Not on file  Social History Narrative  . Not on file    Past Medical History, Surgical history, Social history, and Family history were reviewed and updated as appropriate.   Please see review of systems for further details on the patient's review from today.   Review of Systems:  Review of Systems  Constitutional: Negative for appetite change, chills, diaphoresis and fever.  Respiratory: Negative for cough, chest tightness and shortness of breath.   Cardiovascular: Negative for chest pain, palpitations and leg swelling.  Gastrointestinal: Positive for abdominal distention, abdominal pain and diarrhea. Negative for blood in stool, constipation, nausea and vomiting.  Genitourinary: Negative for decreased urine volume and difficulty urinating.  Neurological: Positive for weakness.    Objective:   Physical Exam:  BP 108/74 (BP Location: Left Arm, Patient Position: Sitting)   Pulse (!) 105   Temp 97.8 F (36.6 C) (Oral)   Resp 18   Ht 5' 10.5" (1.791 m)   Wt 162 lb 9.6 oz (73.8 kg)   SpO2 100%   BMI 23.00 kg/m  ECOG: 2  Physical Exam  Constitutional: No distress.  HENT:  Head: Normocephalic and  atraumatic.  Mucous membranes are dry.  Cardiovascular: Normal rate, regular rhythm and normal heart sounds. Exam reveals no gallop and no friction rub.  No murmur heard. Pulmonary/Chest: Effort normal and breath sounds normal. No respiratory distress. He has no wheezes. He has no rales.  Abdominal: He exhibits distension. Bowel sounds are decreased. There is no tenderness.    Musculoskeletal: He exhibits no edema.  Neurological: He is alert.  Skin: Skin is warm and dry. He is not diaphoretic.  Increased tenting of the dorsal hands.    Lab Review:     Component Value Date/Time   NA 138 10/17/2017 0732   NA 140 101/15/2018 1136   K 3.8 10/17/2017 0732   K 3.9 101/15/2018 1136   CL 102 10/17/2017 0732   CO2 23 10/17/2017 0732   CO2 25 101/15/2018 1136   GLUCOSE 143 (H) 10/17/2017 0732   GLUCOSE 107 101/15/2018 1136   BUN 21 10/17/2017 0732   BUN 15.8 101/15/2018 1136   CREATININE 0.95 10/17/2017 0732   CREATININE 0.9 101/15/2018 1136   CALCIUM 9.2 10/17/2017 0732   CALCIUM 9.2 101/15/2018 1136   PROT 6.8 10/17/2017 0732   PROT 6.8 101/15/2018 1136   ALBUMIN 3.0 (L) 10/17/2017 0732   ALBUMIN 3.7 101/15/2018 1136   AST 19 10/17/2017 0732   AST 19 101/15/2018 1136   ALT 15 10/17/2017 0732   ALT 15 101/15/2018 1136   ALKPHOS 65 10/17/2017 0732   ALKPHOS 55 101/15/2018 1136   BILITOT 0.3 10/17/2017 0732   BILITOT 0.32 101/15/2018 1136   GFRNONAA >60 10/17/2017 0732   GFRAA >60 10/17/2017 0732       Component Value Date/Time   WBC 3.8 (L) 10/23/2017 1752   RBC 3.58 (L) 10/23/2017 1752   HGB 9.9 (L) 10/23/2017  1752   HGB 11.0 (L) 10/17/2017 0732   HGB 11.1 (L) 12020-05-716 1136   HCT 30.0 (L) 10/23/2017 1752   HCT 33.8 (L) 12020-05-716 1136   PLT 293 10/23/2017 1752   PLT 449 (H) 10/17/2017 0732   PLT 216 12020-05-716 1136   MCV 83.8 10/23/2017 1752   MCV 95.8 12020-05-716 1136   MCH 27.7 10/23/2017 1752   MCHC 33.0 10/23/2017 1752   RDW 13.6 10/23/2017 1752   RDW 14.6 12020-05-716  1136   LYMPHSABS PENDING 10/23/2017 1752   LYMPHSABS 0.7 (L) 12020-05-716 1136   MONOABS PENDING 10/23/2017 1752   MONOABS 0.4 12020-05-716 1136   EOSABS PENDING 10/23/2017 1752   EOSABS 0.4 12020-05-716 1136   BASOSABS PENDING 10/23/2017 1752   BASOSABS 0.0 12020-05-716 1136   -------------------------------  Imaging from last 24 hours (if applicable):  Radiology interpretation: Ct Chest W Contrast  Result Date: 10/09/2017 CLINICAL DATA:  Increasing abdominal pain. Prostate cancer with peritoneal metastatic disease EXAM: CT CHEST, ABDOMEN, AND PELVIS WITH CONTRAST TECHNIQUE: Multidetector CT imaging of the chest, abdomen and pelvis was performed following the standard protocol during bolus administration of intravenous contrast. CONTRAST:  131mL ISOVUE-300 IOPAMIDOL (ISOVUE-300) INJECTION 61% COMPARISON:  06/29/2017 FINDINGS: CT CHEST FINDINGS Cardiovascular: Normal heart size. No pericardial effusion. Aortic and coronary atherosclerosis. Mediastinum/Nodes: Interval enlargement of anterior juxta diaphragmatic lymph nodes, 16 mm short axis right para median. Right thyroid nodule, incidental given the clinical circumstances. Lungs/Pleura: Small and dependent right pleural effusion. Trace left pleural effusion. These are stable from prior. Calcified granuloma in the right lower lobe. Negative for pulmonary parenchymal metastasis. Musculoskeletal: Stable sclerosis in the lateral right eighth rib. No acute finding. CT ABDOMEN PELVIS FINDINGS Hepatobiliary: 1 cm low-density in the left liver that is stable since 2017 and benign.No evidence of biliary obstruction or stone. Pancreas: Unremarkable. Spleen: Unremarkable. Adrenals/Urinary Tract: Negative adrenals. No hydronephrosis or stone. Unremarkable bladder. Stomach/Bowel: The distal small bowel shows diffuse low-density submucosal thickening with prominent serosal enhancement. No obstruction or perforation is seen. Major mesenteric vessels are patent  Vascular/Lymphatic: Diffuse atheromatous wall thickening of the aorta and iliacs. Rounded presumed lymph node right peri aortic in the upper abdomen has enlarged to 14 mm diameter. Adenopathy versus is peritoneal nodularity along the ascending colon measuring 15 mm in diameter, progressed. Reproductive:Prostatectomy. No mass in the surgical bed Other: There is small volume generalized ascites with peritoneal thickening intermittently seen. Musculoskeletal: Subcentimeter sclerosis in the upper right ilium is stable. IMPRESSION: 1. Distal small bowel enteritis, nonspecific. 2. Known peritoneal metastatic disease with progressive nodularity and new small ascites. Negative for obstruction. 3. New juxta diaphragmatic lymphadenopathy attributed to #2. 4. Right periaortic nodule, presumably adenopathy, new from prior. Electronically Signed   By: Monte Fantasia M.D.   On: 10/09/2017 10:37   Ct Abdomen Pelvis W Contrast  Result Date: 10/09/2017 CLINICAL DATA:  Increasing abdominal pain. Prostate cancer with peritoneal metastatic disease EXAM: CT CHEST, ABDOMEN, AND PELVIS WITH CONTRAST TECHNIQUE: Multidetector CT imaging of the chest, abdomen and pelvis was performed following the standard protocol during bolus administration of intravenous contrast. CONTRAST:  150mL ISOVUE-300 IOPAMIDOL (ISOVUE-300) INJECTION 61% COMPARISON:  06/29/2017 FINDINGS: CT CHEST FINDINGS Cardiovascular: Normal heart size. No pericardial effusion. Aortic and coronary atherosclerosis. Mediastinum/Nodes: Interval enlargement of anterior juxta diaphragmatic lymph nodes, 16 mm short axis right para median. Right thyroid nodule, incidental given the clinical circumstances. Lungs/Pleura: Small and dependent right pleural effusion. Trace left pleural effusion. These are stable from prior. Calcified granuloma in the  right lower lobe. Negative for pulmonary parenchymal metastasis. Musculoskeletal: Stable sclerosis in the lateral right eighth rib. No  acute finding. CT ABDOMEN PELVIS FINDINGS Hepatobiliary: 1 cm low-density in the left liver that is stable since 2017 and benign.No evidence of biliary obstruction or stone. Pancreas: Unremarkable. Spleen: Unremarkable. Adrenals/Urinary Tract: Negative adrenals. No hydronephrosis or stone. Unremarkable bladder. Stomach/Bowel: The distal small bowel shows diffuse low-density submucosal thickening with prominent serosal enhancement. No obstruction or perforation is seen. Major mesenteric vessels are patent Vascular/Lymphatic: Diffuse atheromatous wall thickening of the aorta and iliacs. Rounded presumed lymph node right peri aortic in the upper abdomen has enlarged to 14 mm diameter. Adenopathy versus is peritoneal nodularity along the ascending colon measuring 15 mm in diameter, progressed. Reproductive:Prostatectomy. No mass in the surgical bed Other: There is small volume generalized ascites with peritoneal thickening intermittently seen. Musculoskeletal: Subcentimeter sclerosis in the upper right ilium is stable. IMPRESSION: 1. Distal small bowel enteritis, nonspecific. 2. Known peritoneal metastatic disease with progressive nodularity and new small ascites. Negative for obstruction. 3. New juxta diaphragmatic lymphadenopathy attributed to #2. 4. Right periaortic nodule, presumably adenopathy, new from prior. Electronically Signed   By: Monte Fantasia M.D.   On: 10/09/2017 10:37

## 2017-10-23 NOTE — ED Notes (Signed)
Bed: WA08 Expected date:  Expected time:  Means of arrival:  Comments: Cancer center patient-diarrhea/wt loss

## 2017-10-23 NOTE — Patient Outreach (Signed)
[place] [facility]) Care Management  10/23/2017  Frank Larsen 05-07-1946 [id number]  .TELEPHONE SCREENING Referral date:10/11/17 Referral source: utilization management Referral reason: member concerned with oncologist office not responding to his multiple request and concerned with next stepsin treatment Insurance: [employer]  Telephone call to patient regarding utilization management HIPAA verified with patient. Patient gave authorization to speak with his wife regarding his medical information. RNCM explained reason for call. Patient states, " I don't have any issues with Dr. Alen Blew.  I really like Dr. Alen Blew." Patient states he has concern that the office is not responding timely to his request. Patient reports the communication breakdown  has occurred over the last 10 days.  Patient states he has been a patient of  Dr. Alen Blew since October 2017 and has been very pleased with his care.  Patient states he had an issue with pain and nausea approximately 2 weeks ago and contacted the  oncology office but did not hear back.  Patient states he did go to the emergency room.  Patient states he contacted the oncology last week due to increase pain.  Patient states he did hear back from the oncology office but it was not a timely response. RNCM inquired if patient had reported his concerns to the oncology office.  Patient states he had not.    Patient reports his wife called the oncologist office last night and this morning leaving a message due to his issues with diarrhea. Patient reports he has had diarrhea over the past several days. States he hasn't eaten well within the past 3-4 lbs and he has lost approximately 15 lbs during this time.   Patient states he has not heard back from the oncology office as of yet.  RNCM offered to contact patient's oncology office to report his symptoms and concerns. Patient verbally agreed.  RNCM discussed and offered [facility] care management services.  Patient refused services at this time. Patient states he would like to talk further with Dr. Alen Blew regarding his care and concerns. Patient verbally agreed to received [facility] care management brochure/ magnet.  RNCM called Dr. Hazeline Junker office and left a message on Dr. Hazeline Junker nurse line explaining patients new symptoms and concerns. RNCM requested a return call.      PLAN: RNCM will await follow up from oncology office. If no response RNCm will attempt 2nd telephone outreach within 4 business days.  RNCM will send patient [facility] care management brochure / magnet as discussed.   Quinn Plowman RN,BSN,CCM [facility] Telephonic  [phone]

## 2017-10-23 NOTE — H&P (Signed)
History and Physical    Frank Larsen FXT:[id number] DOB: 1946/02/23 DOA: 10/23/2017  Referring MD/NP/PA: Dr. Dorene Ar PCP: [name], MD  Patient coming from: Sent from cancer center  Chief Complaint: Diarrhea  I have personally briefly reviewed patient's old medical records in [place]   HPI: Frank Larsen is a 72 y.o. male with medical history significant of metastatic prostate cancer on chemotherapy followed by Dr. Alen Blew, HLD, DM type 2, and GERD; who presents with complaints of diarrhea over the last 4 days.  Today in particular he reports having 9 episodes of diarrhea.  He relates symptoms to recently given Neulasta injection.  In addition to these acute symptoms he reports having progressively worsening lower abdominal pain that he describes as ripping in nature over the last 3 weeks.  He has been trying to use hydrocodone without relief.  Associated symptoms weight loss of 20 to 25 pounds in last 1/2 weeks, generalized malaise, no appetite, and decreased oral intake.  Denies any significant fever, vomiting, or recent sick contacts.   ED Course: Upon admission to the emergency department patient was noted to be afebrile, heart rates 80-1 05, and all other vital signs relatively within normal limits.  Labs revealed WBC 3.8, hemoglobin 9.9, potassium 2.9, BUN 41, creatinine 1.47, and magnesium 1.2.  Patient was given 1 L of normal saline IV fluids with 10 mEq of potassium chloride.  No initial lactic acid was obtained.  GI studies were ordered.    Review of Systems  Constitutional: Positive for malaise/fatigue and weight loss. Negative for fever.  HENT: Negative for ear discharge and nosebleeds.   Eyes: Negative for photophobia and pain.  Respiratory: Negative for sputum production and shortness of breath.   Cardiovascular: Negative for chest pain and leg swelling.  Gastrointestinal: Positive for abdominal pain and diarrhea. Negative for blood in stool and  vomiting.  Genitourinary: Negative for dysuria and hematuria.  Musculoskeletal: Positive for myalgias. Negative for joint pain.  Skin: Negative for itching.  Neurological: Positive for weakness. Negative for focal weakness and loss of consciousness.  Psychiatric/Behavioral: Negative for memory loss and substance abuse.    Past Medical History:  Diagnosis Date  . AK (actinic keratosis)   . Arthritis   . BPH (benign prostatic hyperplasia)   . Chronic low back pain    since 1979  . Diabetes mellitus ORAL MED   type 2  . Elevated PSA   . GERD (gastroesophageal reflux disease)   . Hearing loss    using bilateral hearing aids  . History of radiation therapy 01/11/14- 03/04/14   prostate bed 6600 cGy 33 sessions  . Hypercholesterolemia   . Hypercholesterolemia   . Hyperlipidemia   . Impaired hearing BILATERAL HEARING AIDS   20% RIGHT HEARING DUE TO VIRUS  . Melanoma ([place])   . Normal cardiac stress test 2010  . Prostate cancer ([place]) 07/20/11   Gleason 9  . Prostate cancer ([place])   . Prostate nodule   . Skin cancer    malignant melanoma of skin   . Tinnitus    stable since 1992  . Tobacco use     Past Surgical History:  Procedure Laterality Date  . APPENDECTOMY  AGE 49  . CARPOMETACARPAL JOINT ARTHROTOMY  03-22-2006   RIGHT THUMB  . CARPOMETACARPAL JOINT ARTHROTOMY  12-21-2005   LEFT THUMB  . IR GENERIC HISTORICAL  03/22/2016   IR US GUIDE VASC ACCESS RIGHT [facility]  . IR GENERIC HISTORICAL  03/22/2016   IR FLUORO GUIDE PORT INSERTION RIGHT [facility]  . MELANOMA EXCISION  07-12-10   RIGHT NECK  . PROSTATE BIOPSY  07/20/2011   Procedure: BIOPSY TRANSRECTAL ULTRASONIC PROSTATE (TUBP);  Surgeon: Molli Hazard, MD;  [facility];  Service: Urology;  Laterality: N/A;  1 hour requested for this case  Saturation BX  OFFICE TO BRING ULTRASOUND MACHINE    . ROBOT ASSISTED LAPAROSCOPIC RADICAL PROSTATECTOMY  10/19/2011   Procedure:  ROBOTIC ASSISTED LAPAROSCOPIC RADICAL PROSTATECTOMY;  Surgeon: Molli Hazard, MD;  Location: [facility];  Service: Urology;  Laterality: N/A;      . SHOULDER OPEN ROTATOR CUFF REPAIR  07-29-2010- RIGHT   AND SUBACROMIAL DECOMPRESSION AROMIOPLASTY  . TONSILLECTOMY  AGE 33     reports that he quit smoking about 9 years ago. His smoking use included cigarettes. He has a 43.00 pack-year smoking history. He has never used smokeless tobacco. He reports that he drinks alcohol. He reports that he does not use drugs.  Allergies  Allergen Reactions  . Scallops [Shellfish Allergy] Nausea And Vomiting  . Cephalexin Hives and Rash    Family History  Problem Relation Age of Onset  . Cancer Mother        breast/lived 6 years post  . Alzheimer's disease Mother        deceased  . Cancer Brother        thyroid  . Heart disease Brother   . Stroke Father        29 years old  . Hypertension Father   . Alcoholism Father   . CVA Father   . Cirrhosis Paternal Grandfather        alcohol-related  . Cancer Paternal Grandmother        unknown type     Prior to Admission medications   Medication Sig Start Date End Date Taking? Authorizing Provider  atorvastatin (LIPITOR) 10 MG tablet Take 10 mg by mouth daily with breakfast.    Yes [provider]  glipiZIDE (GLUCOTROL XL) 5 MG 24 hr tablet Take 1 tablet (5 mg total) by mouth daily with breakfast. 01/30/17  Yes Philemon Kingdom, MD  HYDROcodone-acetaminophen (NORCO/VICODIN) 5-325 MG tablet Take 1-2 tablets by mouth every 4 (four) hours as needed. Patient taking differently: Take 1-2 tablets by mouth every 4 (four) hours as needed for moderate pain or severe pain.  10/20/17  Yes Wyatt Portela, MD  hydrOXYzine (ATARAX/VISTARIL) 10 MG tablet TAKE 1 TABLET BY MOUTH EVERY 6 HOURS AS NEEDED Patient taking differently: TAKE 1 TABLET BY MOUTH EVERY 6 HOURS AS NEEDED FOR ITCHING 10/23/17  Yes Shadad, Mathis Dad, MD  lidocaine-prilocaine (EMLA) cream  Apply 1 application topically as needed. Apply to port before chemotherapy. 01/23/17  Yes Wyatt Portela, MD  loperamide (IMODIUM A-D) 2 MG tablet Take 2 mg by mouth 4 (four) times daily as needed for diarrhea or loose stools.   Yes [provider]  metFORMIN (GLUCOPHAGE) 1000 MG tablet Take 1 tablet (1,000 mg total) by mouth 2 (two) times daily with a meal. 01/30/17  Yes Philemon Kingdom, MD  Multiple Vitamin (MULTIVITAMIN) tablet Take 1 tablet by mouth daily.   Yes [provider]  omeprazole (PRILOSEC) 20 MG capsule Take 20 mg by mouth every morning.   Yes [provider]  pseudoephedrine-acetaminophen (TYLENOL SINUS) 30-500 MG TABS tablet Take 1 tablet by mouth daily as needed (congestion).    Yes [provider]  sitaGLIPtin (JANUVIA) 100  MG tablet Take 1 tablet (100 mg total) by mouth daily. 09/29/17  Yes Philemon Kingdom, MD  Blood Glucose Monitoring Suppl (ONE TOUCH ULTRA 2) w/Device KIT Use to check blood sugar 2 times per day. 10/30/15   Elayne Snare, MD  glucose blood (ONE TOUCH ULTRA TEST) test strip Use to check blood sugar 2 times per day. 01/25/16   Philemon Kingdom, MD  naproxen sodium (ALEVE) 220 MG tablet Take 440 mg by mouth 2 (two) times daily as needed (pain).    [provider]  [facility] DELICA LANCETS 63S MISC Use to check blood sugar 2 times per day. 01/27/16   Philemon Kingdom, MD  prochlorperazine (COMPAZINE) 10 MG tablet Take 1 tablet (10 mg total) by mouth every 6 (six) hours as needed for nausea or vomiting. 10/17/17   Wyatt Portela, MD    Physical Exam:  Constitutional: Ill-appearing elderly male no acute distress Vitals:   10/23/17 1937 10/23/17 1957 10/23/17 2151 10/23/17 2154  BP: 136/80 136/80 132/85 124/65  Pulse: 83 92 80 83  Resp:  '17 18 11  ' Temp:      TempSrc:      SpO2: 98% 99% 98% 97%  Weight:      Height:       Eyes: PERRL, lids and conjunctivae normal ENMT: Mucous membranes are dry. Posterior pharynx  clear of any exudate or lesions.  Neck: normal, supple, no masses, no thyromegaly Respiratory: clear to auscultation bilaterally, no wheezing, no crackles. Normal respiratory effort. No accessory muscle use.  Cardiovascular: Regular rate and rhythm, no murmurs / rubs / gallops. No extremity edema. 2+ pedal pulses. No carotid bruits.  Abdomen: Lower abdominal tenderness to palpation present, no masses palpated. No hepatosplenomegaly. Bowel sounds positive.  Musculoskeletal: no clubbing / cyanosis. No joint deformity upper and lower extremities. Good ROM, no contractures. Normal muscle tone.  Skin: Pallor present, no rashes, lesions, ulcers. No induration Neurologic: CN 2-12 grossly intact. Sensation intact, DTR normal. Strength 5/5 in all 4.  Psychiatric: Normal judgment and insight. Alert and oriented x 3. Normal mood.     Labs on Admission: I have personally reviewed following labs and imaging studies  CBC: Recent Labs  Lab 10/17/17 0732 10/23/17 1752  WBC 9.4 3.8*  NEUTROABS 7.4* 2.8  HGB 11.0* 9.9*  HCT 34.4* 30.0*  MCV 86.0 83.8  PLT 449* 937   Basic Metabolic Panel: Recent Labs  Lab 10/17/17 0732 10/23/17 1752  NA 138 136  K 3.8 2.9*  CL 102 100*  CO2 23 22  GLUCOSE 143* 164*  BUN 21 41*  CREATININE 0.95 1.47*  CALCIUM 9.2 8.6*  MG  --  1.2*   GFR: Estimated Creatinine Clearance: 47.2 mL/min (A) (by C-G formula based on SCr of 1.47 mg/dL (H)). Liver Function Tests: Recent Labs  Lab 10/17/17 0732 10/23/17 1752  AST 19 29  ALT 15 25  ALKPHOS 65 60  BILITOT 0.3 0.8  PROT 6.8 6.5  ALBUMIN 3.0* 3.1*   Recent Labs  Lab 10/23/17 1752  LIPASE 27   No results for input(s): AMMONIA in the last 168 hours. Coagulation Profile: No results for input(s): INR, PROTIME in the last 168 hours. Cardiac Enzymes: No results for input(s): CKTOTAL, CKMB, CKMBINDEX, TROPONINI in the last 168 hours. BNP (last 3 results) No results for input(s): PROBNP in the last 8760  hours. HbA1C: No results for input(s): HGBA1C in the last 72 hours. CBG: No results for input(s): GLUCAP in the last 168 hours. Lipid  Profile: No results for input(s): CHOL, HDL, LDLCALC, TRIG, CHOLHDL, LDLDIRECT in the last 72 hours. Thyroid Function Tests: No results for input(s): TSH, T4TOTAL, FREET4, T3FREE, THYROIDAB in the last 72 hours. Anemia Panel: No results for input(s): VITAMINB12, FOLATE, FERRITIN, TIBC, IRON, RETICCTPCT in the last 72 hours. Urine analysis:    Component Value Date/Time   COLORURINE YELLOW 10/23/2017 1957   APPEARANCEUR CLEAR 10/23/2017 1957   LABSPEC 1.023 10/23/2017 1957   PHURINE 5.0 10/23/2017 1957   GLUCOSEU NEGATIVE 10/23/2017 1957   HGBUR NEGATIVE 10/23/2017 1957   BILIRUBINUR NEGATIVE 10/23/2017 1957   KETONESUR NEGATIVE 10/23/2017 1957   PROTEINUR NEGATIVE 10/23/2017 1957   NITRITE NEGATIVE 10/23/2017 1957   LEUKOCYTESUR NEGATIVE 10/23/2017 1957   Sepsis Labs: No results found for this or any previous visit (from the past 240 hour(s)).   Radiological Exams on Admission: Ct Abdomen Pelvis W Contrast  Result Date: 10/23/2017 CLINICAL DATA:  72 y/o M; abdominal pain, decreased appetite, and diarrhea. Cancer patient with last treatment last Tuesday. EXAM: CT ABDOMEN AND PELVIS WITH CONTRAST TECHNIQUE: Multidetector CT imaging of the abdomen and pelvis was performed using the standard protocol following bolus administration of intravenous contrast. CONTRAST:  157m ISOVUE-300 IOPAMIDOL (ISOVUE-300) INJECTION 61% COMPARISON:  10/09/2017 CT abdomen and pelvis. FINDINGS: Lower chest: Small right and trace left pleural effusions. Moderate coronary artery calcific atherosclerosis. Hepatobiliary: Subcentimeter segment 2 fluid attenuating structure compatible with cyst is stable. No other liver lesion identified. Normal gallbladder. Pancreas: Unremarkable. No pancreatic ductal dilatation or surrounding inflammatory changes. Spleen: Normal in size without  focal abnormality. Adrenals/Urinary Tract: Adrenal glands are unremarkable. Kidneys are normal, without renal calculi, focal lesion, or hydronephrosis. Mild wall thickening of the bladder. Stomach/Bowel: Mild diffuse dilatation of the small bowel with bowel wall thickening and mucosal enhancement. Small bowel distention is overall mildly increased when compared with the prior CT of abdomen and pelvis. Additionally there is new mild air-filled distention of the colon with mild wall thickening of sigmoid colon and rectum. No evidence for perforation or abscess. Vascular/Lymphatic: Stable small volume of peritoneal ascites peritoneal nodularity. Abdominal aortic calcific atherosclerosis. Reproductive: Prostatectomy. Other: No abdominal wall hernia or abnormality. No abdominopelvic ascites. Musculoskeletal: No fracture is seen. IMPRESSION: 1. Persistent diffuse nonspecific enteritis and new inflammation of sigmoid colon and rectum. Mild interval distention of small bowel and colon without focal obstruction probably representing ileus. 2. Stable peritoneal carcinomatosis and ascites. 3. Stable small right and trace left pleural effusions. Electronically Signed   By: LKristine GarbeM.D.   On: 10/23/2017 20:56   Dg Abdomen Acute W/chest  Result Date: 10/23/2017 CLINICAL DATA:  72y/o M; abdominal pain, decreased appetite, diarrhea. History of cancer undergoing treatment. EXAM: DG ABDOMEN ACUTE W/ 1V CHEST COMPARISON:  10/09/2017 CT chest, abdomen, and pelvis. FINDINGS: Stable small right pleural effusion and basilar atelectasis. Right port catheter tip projects over mid SVC. Stable cardiac silhouette. Mild aortic calcific atherosclerosis. Rotator cuff surgical repair the right shoulder. Diffusely dilated loops of small bowel with fluid levels may represent ileus or obstruction. IMPRESSION: 1. Diffusely dilated loops of small bowel with fluid levels may represent ileus or obstruction. 2. Stable small right  pleural effusion. Electronically Signed   By: LKristine GarbeM.D.   On: 10/23/2017 18:42    EKG: Independently reviewed.  Normal sinus rhythm at 72 bpm with prolonged QT interval 525  Assessment/Plan Diarrhea 2/2 enteritis and/or colitis: Acute.  Patient presents with complaints of diarrhea over the last couple weeks.  CT  imaging shows signs of enteritis with signs of inflammation on the sigmoid and rectum that are new. - Admit to a telemetry bed - Monitor intake and output - Check stool studies if diarrhea persist - Check lactic acid  - Empiric antibiotics metronidazole and ciprofloxacin  Suspected ileus: Acute.  Patient noted to have significantly dilated loops of bowel on admission. - N.p.o. for now   SIRS, pancytopenia: Acute.  Resents with a WBC of 3.8 and an acute drop in hemoglobin from 11 down to 9.9 on admission.  Patient meets SIRS criteria with initial tachycardia and leukopenia.  No initial lactic acid was obtained. -SIRS/sepsis order set initiated - Type and screen for possible need of blood products - Follow-up stool guaiac - Recheck CBC in a.m.  Acute kidney injury: Acute. patient baseline creatinine previously noted to be within normal limits, but presents with a creatinine of 1.47 with BUN elevated at 41.  Elevated BUN to creatinine ratio suggest prerenal cause of symptoms - IV fluids of normal saline at 100 mL/h - Hold nephrotoxic agents  Hypokalemia: Acute.  Patient initial potassium noted be 2.9.  Patient was given 10 mEq in the ED. - Give additional 40 mEq of potassium chloride IV - Continue to monitor and replace as needed  Hypomagnesia: Initial magnesium 1.2 on admission - Give 2 g of magnesium sulfate IV - Continue to monitor and replace as needed  Diabetes mellitus type 2: Last hemoglobin A1c was noted to be 7.6 on 09/29/2017. - Hypoglycemic protocols - Hold metformin, glipizide, Januvia - CBGs every 6 hours   Metastatic prostate cancer :  Patient currently receiving chemotherapy under the care of Dr. Alen Blew. - Will need to notify Dr. Alen Blew in a.m   DVT prophylaxis: SCDs Code Status: Full Family Communication: No family present at bedside Disposition Plan: Likely discharge home once medically stable Consults called: None Admission status: Inpatient  Norval Morton MD [facility] Pager [phone]   If 7PM-7AM, please contact night-coverage [email or web address] Password [facility]  10/23/2017, 9:55 PM

## 2017-10-23 NOTE — ED Provider Notes (Signed)
[facility] DEPT Provider Note   CSN: [id number] Arrival date & time: 10/23/17  1705     History   Chief Complaint Chief Complaint  Patient presents with  . Diarrhea  . Abdominal Pain    HPI Frank Larsen is a 72 y.o. male.  HPI Patient has metastatic pancreatic cancer.  Sent in from cancer center.  Has had diarrhea for around the last 3 weeks.  Is currently on chemotherapy with last treatment 6 days ago.  Also had Neulasta.  Has had 7 episodes of diarrhea today.  Has lost around 20 pounds of weight with this.  Decreased appetite.  Decreased oral intake.  Had severe abdominal pain a few days ago but that has resolved. Past Medical History:  Diagnosis Date  . AK (actinic keratosis)   . Arthritis   . BPH (benign prostatic hyperplasia)   . Chronic low back pain    since 1979  . Diabetes mellitus ORAL MED   type 2  . Elevated PSA   . GERD (gastroesophageal reflux disease)   . Hearing loss    using bilateral hearing aids  . History of radiation therapy 01/11/14- 03/04/14   prostate bed 6600 cGy 33 sessions  . Hypercholesterolemia   . Hypercholesterolemia   . Hyperlipidemia   . Impaired hearing BILATERAL HEARING AIDS   20% RIGHT HEARING DUE TO VIRUS  . Melanoma ([place])   . Normal cardiac stress test 2010  . Prostate cancer ([place]) 07/20/11   Gleason 9  . Prostate cancer ([place])   . Prostate nodule   . Skin cancer    malignant melanoma of skin   . Tinnitus    stable since 1992  . Tobacco use     Patient Active Problem List   Diagnosis Date Noted  . Goals of care, counseling/discussion 11/16/2016  . Port catheter in place 03/30/2016  . Controlled type 2 diabetes mellitus with neuropathy ([place]) 10/29/2015  . Hypercholesterolemia   . BPH (benign prostatic hyperplasia)   . Prostate CA ([place]) 08/17/2011    Past Surgical History:  Procedure Laterality Date  . APPENDECTOMY  AGE 69  . CARPOMETACARPAL JOINT ARTHROTOMY  03-22-2006   RIGHT THUMB   . CARPOMETACARPAL JOINT ARTHROTOMY  12-21-2005   LEFT THUMB  . IR GENERIC HISTORICAL  03/22/2016   IR US GUIDE VASC ACCESS RIGHT [facility]  . IR GENERIC HISTORICAL  03/22/2016   IR FLUORO GUIDE PORT INSERTION RIGHT [facility]  . MELANOMA EXCISION  07-12-10   RIGHT NECK  . PROSTATE BIOPSY  07/20/2011   Procedure: BIOPSY TRANSRECTAL ULTRASONIC PROSTATE (TUBP);  Surgeon: Molli Hazard, MD;  Location: [facility];  Service: Urology;  Laterality: N/A;  1 hour requested for this case  Saturation BX  OFFICE TO BRING ULTRASOUND MACHINE    . ROBOT ASSISTED LAPAROSCOPIC RADICAL PROSTATECTOMY  10/19/2011   Procedure: ROBOTIC ASSISTED LAPAROSCOPIC RADICAL PROSTATECTOMY;  Surgeon: Molli Hazard, MD;  Location: [facility];  Service: Urology;  Laterality: N/A;      . SHOULDER OPEN ROTATOR CUFF REPAIR  07-29-2010- RIGHT   AND SUBACROMIAL DECOMPRESSION AROMIOPLASTY  . TONSILLECTOMY  AGE 74        Home Medications    Prior to Admission medications   Medication Sig Start Date End Date Taking? Authorizing Provider  atorvastatin (LIPITOR) 10 MG tablet Take 10 mg by mouth daily with breakfast.    Yes [provider]  glipiZIDE (GLUCOTROL XL) 5 MG 24 hr tablet  Take 1 tablet (5 mg total) by mouth daily with breakfast. 01/30/17  Yes Philemon Kingdom, MD  HYDROcodone-acetaminophen (NORCO/VICODIN) 5-325 MG tablet Take 1-2 tablets by mouth every 4 (four) hours as needed. Patient taking differently: Take 1-2 tablets by mouth every 4 (four) hours as needed for moderate pain or severe pain.  10/20/17  Yes Wyatt Portela, MD  hydrOXYzine (ATARAX/VISTARIL) 10 MG tablet TAKE 1 TABLET BY MOUTH EVERY 6 HOURS AS NEEDED Patient taking differently: TAKE 1 TABLET BY MOUTH EVERY 6 HOURS AS NEEDED FOR ITCHING 10/23/17  Yes Shadad, Mathis Dad, MD  lidocaine-prilocaine (EMLA) cream Apply 1 application topically as needed. Apply to port before chemotherapy. 01/23/17  Yes Wyatt Portela, MD  loperamide (IMODIUM A-D) 2 MG tablet Take 2 mg by mouth 4 (four) times daily as needed for diarrhea or loose stools.   Yes [provider]  metFORMIN (GLUCOPHAGE) 1000 MG tablet Take 1 tablet (1,000 mg total) by mouth 2 (two) times daily with a meal. 01/30/17  Yes Philemon Kingdom, MD  Multiple Vitamin (MULTIVITAMIN) tablet Take 1 tablet by mouth daily.   Yes [provider]  omeprazole (PRILOSEC) 20 MG capsule Take 20 mg by mouth every morning.   Yes [provider]  pseudoephedrine-acetaminophen (TYLENOL SINUS) 30-500 MG TABS tablet Take 1 tablet by mouth daily as needed (congestion).    Yes [provider]  sitaGLIPtin (JANUVIA) 100 MG tablet Take 1 tablet (100 mg total) by mouth daily. 09/29/17  Yes Philemon Kingdom, MD  Blood Glucose Monitoring Suppl (ONE TOUCH ULTRA 2) w/Device KIT Use to check blood sugar 2 times per day. 10/30/15   Elayne Snare, MD  glucose blood (ONE TOUCH ULTRA TEST) test strip Use to check blood sugar 2 times per day. 01/25/16   Philemon Kingdom, MD  naproxen sodium (ALEVE) 220 MG tablet Take 440 mg by mouth 2 (two) times daily as needed (pain).    [provider]  [facility] DELICA LANCETS 81X MISC Use to check blood sugar 2 times per day. 01/27/16   Philemon Kingdom, MD  prochlorperazine (COMPAZINE) 10 MG tablet Take 1 tablet (10 mg total) by mouth every 6 (six) hours as needed for nausea or vomiting. 10/17/17   Wyatt Portela, MD    Family History Family History  Problem Relation Age of Onset  . Cancer Mother        breast/lived 56 years post  . Alzheimer's disease Mother        deceased  . Cancer Brother        thyroid  . Heart disease Brother   . Stroke Father        40 years old  . Hypertension Father   . Alcoholism Father   . CVA Father   . Cirrhosis Paternal Grandfather        alcohol-related  . Cancer Paternal Grandmother        unknown type     Social History Social History   Tobacco Use  .  Smoking status: Former Smoker    Packs/day: 1.00    Years: 43.00    Pack years: 43.00    Types: Cigarettes    Last attempt to quit: 12/07/2007    Years since quitting: 9.8  . Smokeless tobacco: Never Used  Substance Use Topics  . Alcohol use: Yes    Comment: OCCASIONAL  . Drug use: No     Allergies   Scallops [shellfish allergy] and Cephalexin   Review of Systems Review  of Systems  Constitutional: Positive for appetite change and fatigue.  HENT: Negative for congestion.   Respiratory: Negative for shortness of breath.   Cardiovascular: Negative for chest pain.  Gastrointestinal: Positive for abdominal pain and diarrhea. Negative for nausea.  Genitourinary: Negative for flank pain.  Musculoskeletal: Negative for back pain.  Skin: Negative for rash.  Neurological: Positive for light-headedness. Negative for weakness.  Psychiatric/Behavioral: Negative for confusion.     Physical Exam Updated Vital Signs BP 136/80 (BP Location: Left Arm)   Pulse 92   Temp 97.8 F (36.6 C) (Oral)   Resp 17   Ht 5' 10.5" (1.791 m)   Wt 73.5 kg (162 lb)   SpO2 99%   BMI 22.92 kg/m   Physical Exam  Constitutional: He appears well-developed.  HENT:  Mucous membranes are dry  Eyes: Pupils are equal, round, and reactive to light.  Cardiovascular:  Mild tachycardia  Pulmonary/Chest:  Port-A-Cath to left chest wall  Abdominal: No hernia.  Distended abdomen.  Ascites.  Mild lower abdominal tenderness without hernia palpated.  Neurological: He is alert.  Skin: Skin is warm. Capillary refill takes less than 2 seconds.     ED Treatments / Results  Labs (all labs ordered are listed, but only abnormal results are displayed) Labs Reviewed  COMPREHENSIVE METABOLIC PANEL - Abnormal; Notable for the following components:      Result Value   Potassium 2.9 (*)    Chloride 100 (*)    Glucose, Bld 164 (*)    BUN 41 (*)    Creatinine, Ser 1.47 (*)    Calcium 8.6 (*)    Albumin 3.1 (*)      GFR calc non Af Amer 46 (*)    GFR calc Af Amer 53 (*)    All other components within normal limits  CBC WITH DIFFERENTIAL/PLATELET - Abnormal; Notable for the following components:   WBC 3.8 (*)    RBC 3.58 (*)    Hemoglobin 9.9 (*)    HCT 30.0 (*)    Lymphs Abs 0.5 (*)    All other components within normal limits  MAGNESIUM - Abnormal; Notable for the following components:   Magnesium 1.2 (*)    All other components within normal limits  GASTROINTESTINAL PANEL BY PCR, STOOL (REPLACES STOOL CULTURE)  C DIFFICILE QUICK SCREEN W PCR REFLEX  LIPASE, BLOOD  URINALYSIS, ROUTINE W REFLEX MICROSCOPIC    EKG None  Radiology Ct Abdomen Pelvis W Contrast  Result Date: 10/23/2017 CLINICAL DATA:  72 y/o M; abdominal pain, decreased appetite, and diarrhea. Cancer patient with last treatment last Tuesday. EXAM: CT ABDOMEN AND PELVIS WITH CONTRAST TECHNIQUE: Multidetector CT imaging of the abdomen and pelvis was performed using the standard protocol following bolus administration of intravenous contrast. CONTRAST:  156m ISOVUE-300 IOPAMIDOL (ISOVUE-300) INJECTION 61% COMPARISON:  10/09/2017 CT abdomen and pelvis. FINDINGS: Lower chest: Small right and trace left pleural effusions. Moderate coronary artery calcific atherosclerosis. Hepatobiliary: Subcentimeter segment 2 fluid attenuating structure compatible with cyst is stable. No other liver lesion identified. Normal gallbladder. Pancreas: Unremarkable. No pancreatic ductal dilatation or surrounding inflammatory changes. Spleen: Normal in size without focal abnormality. Adrenals/Urinary Tract: Adrenal glands are unremarkable. Kidneys are normal, without renal calculi, focal lesion, or hydronephrosis. Mild wall thickening of the bladder. Stomach/Bowel: Mild diffuse dilatation of the small bowel with bowel wall thickening and mucosal enhancement. Small bowel distention is overall mildly increased when compared with the prior CT of abdomen and pelvis.  Additionally there is new mild  air-filled distention of the colon with mild wall thickening of sigmoid colon and rectum. No evidence for perforation or abscess. Vascular/Lymphatic: Stable small volume of peritoneal ascites peritoneal nodularity. Abdominal aortic calcific atherosclerosis. Reproductive: Prostatectomy. Other: No abdominal wall hernia or abnormality. No abdominopelvic ascites. Musculoskeletal: No fracture is seen. IMPRESSION: 1. Persistent diffuse nonspecific enteritis and new inflammation of sigmoid colon and rectum. Mild interval distention of small bowel and colon without focal obstruction probably representing ileus. 2. Stable peritoneal carcinomatosis and ascites. 3. Stable small right and trace left pleural effusions. Electronically Signed   By: Kristine Garbe M.D.   On: 10/23/2017 20:56   Dg Abdomen Acute W/chest  Result Date: 10/23/2017 CLINICAL DATA:  72 y/o M; abdominal pain, decreased appetite, diarrhea. History of cancer undergoing treatment. EXAM: DG ABDOMEN ACUTE W/ 1V CHEST COMPARISON:  10/09/2017 CT chest, abdomen, and pelvis. FINDINGS: Stable small right pleural effusion and basilar atelectasis. Right port catheter tip projects over mid SVC. Stable cardiac silhouette. Mild aortic calcific atherosclerosis. Rotator cuff surgical repair the right shoulder. Diffusely dilated loops of small bowel with fluid levels may represent ileus or obstruction. IMPRESSION: 1. Diffusely dilated loops of small bowel with fluid levels may represent ileus or obstruction. 2. Stable small right pleural effusion. Electronically Signed   By: Kristine Garbe M.D.   On: 10/23/2017 18:42    Procedures Procedures (including critical care time)  Medications Ordered in ED Medications  sodium chloride 0.9 % bolus 1,000 mL (0 mLs Intravenous Stopped 10/23/17 1853)  potassium chloride 10 mEq in 100 mL IVPB (0 mEq Intravenous Stopped 10/23/17 2102)  iopamidol (ISOVUE-300) 61 % injection 100  mL (100 mLs Intravenous Contrast Given 10/23/17 2007)     Initial Impression / Assessment and Plan / ED Course  I have reviewed the triage vital signs and the nursing notes.  Pertinent labs & imaging results that were available during my care of the patient were reviewed by me and considered in my medical decision making (see chart for details).    Patient with diarrhea dehydration.  Worsening renal function with low potassium and magnesium.  Has had difficulty tolerating orals with decreased appetite.  CT scan redone due to recent potential enteritis.  The extent of this has increased.  CT scan done after nonspecific x-ray.  With weight loss of nearly 20 pounds and electrolyte abnormalities I feel that patient would benefit from admission to the hospital.  Will admit to hospitalist  Final Clinical Impressions(s) / ED Diagnoses   Final diagnoses:  Diarrhea, unspecified type  Dehydration  Colitis  Enteritis  Hypokalemia  Hypomagnesemia  Prostate cancer [facility])    ED Discharge Orders    None       Davonna Belling, MD 10/23/17 2149

## 2017-10-23 NOTE — ED Notes (Signed)
ED TO INPATIENT HANDOFF REPORT  Name/Age/Gender Frank Larsen 72 y.o. male  Code Status    Code Status Orders  (From admission, onward)        Start     Ordered   10/23/17 2210  Full code  Continuous     10/23/17 2213    Code Status History    This patient has a current code status but no historical code status.      Home/SNF/Other Home  Chief Complaint abdominal pain   Level of Care/Admitting Diagnosis ED Disposition    ED Disposition Condition Comment   Admit  Hospital Area: [facility] [[id number]]  Level of Care: Telemetry [5]  Admit to tele based on following criteria: Complex arrhythmia (Bradycardia/Tachycardia)  Diagnosis: Colitis [[id number]]  Admitting Physician: Norval Morton [[id number]]  Attending Physician: Norval Morton [[id number]]  Estimated length of stay: past midnight tomorrow  Certification:: I certify this patient will need inpatient services for at least 2 midnights  PT Class (Do Not Modify): Inpatient [101]  PT Acc Code (Do Not Modify): Private [1]       Medical History Past Medical History:  Diagnosis Date  . AK (actinic keratosis)   . Arthritis   . BPH (benign prostatic hyperplasia)   . Chronic low back pain    since 1979  . Diabetes mellitus ORAL MED   type 2  . Elevated PSA   . GERD (gastroesophageal reflux disease)   . Hearing loss    using bilateral hearing aids  . History of radiation therapy 01/11/14- 03/04/14   prostate bed 6600 cGy 33 sessions  . Hypercholesterolemia   . Hypercholesterolemia   . Hyperlipidemia   . Impaired hearing BILATERAL HEARING AIDS   20% RIGHT HEARING DUE TO VIRUS  . Melanoma ([place])   . Normal cardiac stress test 2010  . Prostate cancer ([place]) 07/20/11   Gleason 9  . Prostate cancer ([place])   . Prostate nodule   . Skin cancer    malignant melanoma of skin   . Tinnitus    stable since 1992  . Tobacco use     Allergies Allergies  Allergen Reactions  . Scallops [Shellfish  Allergy] Nausea And Vomiting  . Cephalexin Hives and Rash    IV Location/Drains/Wounds Patient Lines/Drains/Airways Status   Active Line/Drains/Airways    Name:   Placement date:   Placement time:   Site:   Days:   Implanted Port 03/22/16 Right Chest   03/22/16    1519    Chest   580   Implanted Port Right Chest   --    --    Chest      Urethral Catheter Coude 20 Fr.   10/19/11    1450    Coude   2196   Incision Rectum   --    --        Incision - 6 Ports Abdomen 1: Left;Lateral;Lower;Other (Comment) 2: Left;Lateral;Upper 3: Superior;Umbilicus 4: Right;Lower;Lateral 5: Right;Medial;Upper 6: Right;Lateral;Upper   10/19/11    0910     2196          Labs/Imaging Results for orders placed or performed during the hospital encounter of 10/23/17 (from the past 48 hour(s))  Comprehensive metabolic panel     Status: Abnormal   Collection Time: 10/23/17  5:52 PM  Result Value Ref Range   Sodium 136 135 - 145 mmol/L   Potassium 2.9 (L) 3.5 - 5.1 mmol/L   Chloride 100 (L)  101 - 111 mmol/L   CO2 22 22 - 32 mmol/L   Glucose, Bld 164 (H) 65 - 99 mg/dL   BUN 41 (H) 6 - 20 mg/dL   Creatinine, Ser 1.47 (H) 0.61 - 1.24 mg/dL   Calcium 8.6 (L) 8.9 - 10.3 mg/dL   Total Protein 6.5 6.5 - 8.1 g/dL   Albumin 3.1 (L) 3.5 - 5.0 g/dL   AST 29 15 - 41 U/L   ALT 25 17 - 63 U/L   Alkaline Phosphatase 60 38 - 126 U/L   Total Bilirubin 0.8 0.3 - 1.2 mg/dL   GFR calc non Af Amer 46 (L) >60 mL/min   GFR calc Af Amer 53 (L) >60 mL/min    Comment: (NOTE) The eGFR has been calculated using the CKD EPI equation. This calculation has not been validated in all clinical situations. eGFR's persistently <60 mL/min signify possible Chronic Kidney Disease.    Anion gap 14 5 - 15    Comment: Performed at [facility], [place] [address], [place], [place] [address]  Lipase, blood     Status: None   Collection Time: 10/23/17  5:52 PM  Result Value Ref Range   Lipase 27 11 - 51 U/L    Comment:  Performed at [facility], [place] [address], [place], [place] [address]  CBC with Differential     Status: Abnormal   Collection Time: 10/23/17  5:52 PM  Result Value Ref Range   WBC 3.8 (L) 4.0 - 10.5 K/uL   RBC 3.58 (L) 4.22 - 5.81 MIL/uL   Hemoglobin 9.9 (L) 13.0 - 17.0 g/dL   HCT 30.0 (L) 39.0 - 52.0 %   MCV 83.8 78.0 - 100.0 fL   MCH 27.7 26.0 - 34.0 pg   MCHC 33.0 30.0 - 36.0 g/dL   RDW 13.6 11.5 - 15.5 %   Platelets 293 150 - 400 K/uL   Neutrophils Relative % 75 %   Lymphocytes Relative 12 %   Monocytes Relative 12 %   Eosinophils Relative 1 %   Basophils Relative 0 %   Neutro Abs 2.8 1.7 - 7.7 K/uL   Lymphs Abs 0.5 (L) 0.7 - 4.0 K/uL   Monocytes Absolute 0.5 0.1 - 1.0 K/uL   Eosinophils Absolute 0.0 0.0 - 0.7 K/uL   Basophils Absolute 0.0 0.0 - 0.1 K/uL   WBC Morphology MILD LEFT SHIFT (1-5% METAS, OCC MYELO, OCC BANDS)     Comment: DOHLE BODIES Performed at [facility] [address] [place], [place] [address]   Magnesium     Status: Abnormal   Collection Time: 10/23/17  5:52 PM  Result Value Ref Range   Magnesium 1.2 (L) 1.7 - 2.4 mg/dL    Comment: Performed at [facility], [place] [address], [place], [place] [address]  Urinalysis, Routine w reflex microscopic     Status: None   Collection Time: 10/23/17  7:57 PM  Result Value Ref Range   Color, Urine YELLOW YELLOW   APPearance CLEAR CLEAR   Specific Gravity, Urine 1.023 1.005 - 1.030   pH 5.0 5.0 - 8.0   Glucose, UA NEGATIVE NEGATIVE mg/dL   Hgb urine dipstick NEGATIVE NEGATIVE   Bilirubin Urine NEGATIVE NEGATIVE   Ketones, ur NEGATIVE NEGATIVE mg/dL   Protein, ur NEGATIVE NEGATIVE mg/dL   Nitrite NEGATIVE NEGATIVE   Leukocytes, UA NEGATIVE NEGATIVE    Comment: Performed at [facility] [address] [place],  [place] [address]  POC occult blood, ED RN will collect     Status: None   Collection Time: 10/23/17 10:21 PM  Result Value  Ref Range   Fecal Occult Bld NEGATIVE NEGATIVE   Ct Abdomen Pelvis W Contrast  Result Date: 10/23/2017 CLINICAL DATA:  72 y/o M; abdominal pain, decreased appetite, and diarrhea. Cancer patient with last treatment last Tuesday. EXAM: CT ABDOMEN AND PELVIS WITH CONTRAST TECHNIQUE: Multidetector CT imaging of the abdomen and pelvis was performed using the standard protocol following bolus administration of intravenous contrast. CONTRAST:  194m ISOVUE-300 IOPAMIDOL (ISOVUE-300) INJECTION 61% COMPARISON:  10/09/2017 CT abdomen and pelvis. FINDINGS: Lower chest: Small right and trace left pleural effusions. Moderate coronary artery calcific atherosclerosis. Hepatobiliary: Subcentimeter segment 2 fluid attenuating structure compatible with cyst is stable. No other liver lesion identified. Normal gallbladder. Pancreas: Unremarkable. No pancreatic ductal dilatation or surrounding inflammatory changes. Spleen: Normal in size without focal abnormality. Adrenals/Urinary Tract: Adrenal glands are unremarkable. Kidneys are normal, without renal calculi, focal lesion, or hydronephrosis. Mild wall thickening of the bladder. Stomach/Bowel: Mild diffuse dilatation of the small bowel with bowel wall thickening and mucosal enhancement. Small bowel distention is overall mildly increased when compared with the prior CT of abdomen and pelvis. Additionally there is new mild air-filled distention of the colon with mild wall thickening of sigmoid colon and rectum. No evidence for perforation or abscess. Vascular/Lymphatic: Stable small volume of peritoneal ascites peritoneal nodularity. Abdominal aortic calcific atherosclerosis. Reproductive: Prostatectomy. Other: No abdominal wall hernia or abnormality. No abdominopelvic ascites. Musculoskeletal: No fracture is seen. IMPRESSION: 1. Persistent diffuse nonspecific enteritis and new inflammation of sigmoid colon and rectum. Mild interval distention of small bowel and colon without focal  obstruction probably representing ileus. 2. Stable peritoneal carcinomatosis and ascites. 3. Stable small right and trace left pleural effusions. Electronically Signed   By: LKristine GarbeM.D.   On: 10/23/2017 20:56   Dg Abdomen Acute W/chest  Result Date: 10/23/2017 CLINICAL DATA:  72y/o M; abdominal pain, decreased appetite, diarrhea. History of cancer undergoing treatment. EXAM: DG ABDOMEN ACUTE W/ 1V CHEST COMPARISON:  10/09/2017 CT chest, abdomen, and pelvis. FINDINGS: Stable small right pleural effusion and basilar atelectasis. Right port catheter tip projects over mid SVC. Stable cardiac silhouette. Mild aortic calcific atherosclerosis. Rotator cuff surgical repair the right shoulder. Diffusely dilated loops of small bowel with fluid levels may represent ileus or obstruction. IMPRESSION: 1. Diffusely dilated loops of small bowel with fluid levels may represent ileus or obstruction. 2. Stable small right pleural effusion. Electronically Signed   By: LKristine GarbeM.D.   On: 10/23/2017 18:42    Pending Labs Unresulted Labs (From admission, onward)   Start     Ordered   10/24/17 0500  Magnesium  Tomorrow morning,   R     10/23/17 2213   10/24/17 0500  CBC  Tomorrow morning,   R     10/23/17 2213   10/24/17 [id number] Basic metabolic panel  Tomorrow morning,   R     10/23/17 2213   10/23/17 2225  Culture, blood (x 2)  BLOOD CULTURE X 2,   STAT    Comments:  INITIATE ANTIBIOTICS WITHIN 1 HOUR AFTER BLOOD CULTURES DRAWN.  If unable to obtain blood cultures, call MD immediately regarding antibiotic instructions.    10/23/17 2225   10/23/17 2224  Lactic acid, plasma  Add-on,   R     10/23/17 2223   10/23/17 2211  Type and screen [facility]  Once,   R    Comments:  [place]    10/23/17 2213   10/23/17 1953  C difficile quick scan w PCR reflex  (C Difficile quick screen w PCR reflex panel)  Once, for 24 hours,   R     10/23/17 1952    10/23/17 1952  Gastrointestinal Panel by PCR , Stool  (Gastrointestinal Panel by PCR, Stool)  Once,   R     10/23/17 1952      Vitals/Pain Today's Vitals   10/23/17 1937 10/23/17 1957 10/23/17 2151 10/23/17 2154  BP: 136/80 136/80 132/85 124/65  Pulse: 83 92 80 83  Resp:  _0 Temp:      TempSrc:      SpO2: 98% 99% 98% 97%  Weight:      Height:      PainSc:        Isolation Precautions Enteric precautions (UV disinfection)  Medications Medications  0.9 %  sodium chloride infusion (has no administration in time range)  potassium chloride 10 mEq in 100 mL IVPB (has no administration in time range)  magnesium sulfate IVPB 2 g 50 mL (has no administration in time range)  ondansetron (ZOFRAN) tablet 4 mg (has no administration in time range)    Or  ondansetron (ZOFRAN) injection 4 mg (has no administration in time range)  acetaminophen (TYLENOL) tablet 650 mg (has no administration in time range)    Or  acetaminophen (TYLENOL) suppository 650 mg (has no administration in time range)  albuterol (PROVENTIL) (2.5 MG/3ML) 0.083% nebulizer solution 2.5 mg (has no administration in time range)  metroNIDAZOLE (FLAGYL) IVPB 500 mg (has no administration in time range)  ciprofloxacin (CIPRO) IVPB 400 mg (has no administration in time range)  sodium chloride 0.9 % bolus 1,000 mL (0 mLs Intravenous Stopped 10/23/17 1853)  potassium chloride 10 mEq in 100 mL IVPB (0 mEq Intravenous Stopped 10/23/17 2102)  iopamidol (ISOVUE-300) 61 % injection 100 mL (100 mLs Intravenous Contrast Given 10/23/17 2007)    Mobility walks

## 2017-10-23 NOTE — ED Triage Notes (Signed)
Pt reports abdominal pain, decreased appetite, and diarrhea. Pt denies emesis and nausea.  Pt reports recently being here for the same.  Pt is a cancer pt and last treatment was last Tuesday.

## 2017-10-24 ENCOUNTER — Other Ambulatory Visit: Payer: PPO

## 2017-10-24 ENCOUNTER — Ambulatory Visit (HOSPITAL_COMMUNITY): Payer: PPO

## 2017-10-24 DIAGNOSIS — K567 Ileus, unspecified: Secondary | ICD-10-CM | POA: Diagnosis present

## 2017-10-24 DIAGNOSIS — D61818 Other pancytopenia: Secondary | ICD-10-CM | POA: Diagnosis present

## 2017-10-24 DIAGNOSIS — N179 Acute kidney failure, unspecified: Secondary | ICD-10-CM | POA: Diagnosis present

## 2017-10-24 DIAGNOSIS — T451X5A Adverse effect of antineoplastic and immunosuppressive drugs, initial encounter: Secondary | ICD-10-CM

## 2017-10-24 DIAGNOSIS — E876 Hypokalemia: Secondary | ICD-10-CM | POA: Diagnosis present

## 2017-10-24 DIAGNOSIS — D6481 Anemia due to antineoplastic chemotherapy: Secondary | ICD-10-CM

## 2017-10-24 DIAGNOSIS — N189 Chronic kidney disease, unspecified: Secondary | ICD-10-CM

## 2017-10-24 LAB — GASTROINTESTINAL PANEL BY PCR, STOOL (REPLACES STOOL CULTURE)
ADENOVIRUS F40/41: NOT DETECTED
ASTROVIRUS: NOT DETECTED
CAMPYLOBACTER SPECIES: NOT DETECTED
CYCLOSPORA CAYETANENSIS: NOT DETECTED
Cryptosporidium: NOT DETECTED
ENTEROPATHOGENIC E COLI (EPEC): NOT DETECTED
ENTEROTOXIGENIC E COLI (ETEC): NOT DETECTED
Entamoeba histolytica: NOT DETECTED
Enteroaggregative E coli (EAEC): NOT DETECTED
Giardia lamblia: NOT DETECTED
NOROVIRUS GI/GII: NOT DETECTED
PLESIMONAS SHIGELLOIDES: NOT DETECTED
ROTAVIRUS A: NOT DETECTED
SHIGA LIKE TOXIN PRODUCING E COLI (STEC): NOT DETECTED
Salmonella species: NOT DETECTED
Sapovirus (I, II, IV, and V): NOT DETECTED
Shigella/Enteroinvasive E coli (EIEC): NOT DETECTED
VIBRIO SPECIES: NOT DETECTED
Vibrio cholerae: NOT DETECTED
Yersinia enterocolitica: NOT DETECTED

## 2017-10-24 LAB — BASIC METABOLIC PANEL
ANION GAP: 12 (ref 5–15)
BUN: 32 mg/dL — AB (ref 6–20)
CO2: 22 mmol/L (ref 22–32)
Calcium: 8 mg/dL — ABNORMAL LOW (ref 8.9–10.3)
Chloride: 104 mmol/L (ref 101–111)
Creatinine, Ser: 1.08 mg/dL (ref 0.61–1.24)
GFR calc Af Amer: >60 mL/min (ref >60)
Glucose, Bld: 111 mg/dL — ABNORMAL HIGH (ref 65–99)
POTASSIUM: 3.1 mmol/L — AB (ref 3.5–5.1)
Sodium: 138 mmol/L (ref 135–145)

## 2017-10-24 LAB — CBC
HEMATOCRIT: 27.3 % — AB (ref 39.0–52.0)
Hemoglobin: 8.8 g/dL — ABNORMAL LOW (ref 13.0–17.0)
MCH: 27.4 pg (ref 26.0–34.0)
MCHC: 32.2 g/dL (ref 30.0–36.0)
MCV: 85 fL (ref 78.0–100.0)
PLATELETS: 248 10*3/uL (ref 150–400)
RBC: 3.21 MIL/uL — AB (ref 4.22–5.81)
RDW: 13.5 % (ref 11.5–15.5)
WBC: 3.8 10*3/uL — ABNORMAL LOW (ref 4.0–10.5)

## 2017-10-24 LAB — TYPE AND SCREEN
ABO/RH(D): A POS
Antibody Screen: NEGATIVE

## 2017-10-24 LAB — C DIFFICILE QUICK SCREEN W PCR REFLEX
C DIFFICILE (CDIFF) INTERP: NOT DETECTED
C DIFFICLE (CDIFF) ANTIGEN: NEGATIVE
C Diff toxin: NEGATIVE

## 2017-10-24 LAB — MAGNESIUM: Magnesium: 1.4 mg/dL — ABNORMAL LOW (ref 1.7–2.4)

## 2017-10-24 LAB — LACTIC ACID, PLASMA: LACTIC ACID, VENOUS: 0.9 mmol/L (ref 0.5–1.9)

## 2017-10-24 LAB — GLUCOSE, CAPILLARY
GLUCOSE-CAPILLARY: 124 mg/dL — AB (ref 65–99)
Glucose-Capillary: 91 mg/dL (ref 65–99)
Glucose-Capillary: 147 mg/dL — ABNORMAL HIGH (ref 65–99)

## 2017-10-24 MED ORDER — BOOST / RESOURCE BREEZE PO LIQD CUSTOM
1.0000 | Freq: Two times a day (BID) | ORAL | Status: DC
  Administered 2017-10-24 (×2): 1 via ORAL

## 2017-10-24 MED ORDER — POTASSIUM CHLORIDE CRYS ER 20 MEQ PO TBCR: 40.0000 meq | EXTENDED_RELEASE_TABLET | Freq: Two times a day (BID) | ORAL | Status: DC

## 2017-10-24 MED ORDER — POTASSIUM CHLORIDE 2 MEQ/ML IV SOLN
INTRAVENOUS | Status: DC
  Filled 2017-10-24: qty 1000

## 2017-10-24 MED ORDER — MAGNESIUM OXIDE 400 (241.3 MG) MG PO TABS
400.0000 mg | ORAL_TABLET | Freq: Two times a day (BID) | ORAL | Status: AC
  Administered 2017-10-24 (×2): 400 mg via ORAL
  Filled 2017-10-24 (×2): qty 1

## 2017-10-24 MED ORDER — INSULIN ASPART 100 UNIT/ML ~~LOC~~ SOLN
3.0000 [IU] | Freq: Three times a day (TID) | SUBCUTANEOUS | Status: DC
  Administered 2017-10-26: 3 [IU] via SUBCUTANEOUS

## 2017-10-24 MED ORDER — CALCIUM CARBONATE ANTACID 500 MG PO CHEW
600.0000 mg | CHEWABLE_TABLET | Freq: Once | ORAL | Status: AC
  Administered 2017-10-24: 400 mg via ORAL
  Filled 2017-10-24: qty 3

## 2017-10-24 MED ORDER — ADULT MULTIVITAMIN W/MINERALS CH
1.0000 | ORAL_TABLET | Freq: Every day | ORAL | Status: DC
  Administered 2017-10-24 – 2017-10-26 (×3): 1 via ORAL
  Filled 2017-10-24 (×3): qty 1

## 2017-10-24 MED ORDER — INSULIN ASPART 100 UNIT/ML ~~LOC~~ SOLN
0.0000 [IU] | Freq: Three times a day (TID) | SUBCUTANEOUS | Status: DC
  Administered 2017-10-26: 1 [IU] via SUBCUTANEOUS

## 2017-10-24 MED ORDER — POTASSIUM CHLORIDE CRYS ER 20 MEQ PO TBCR
40.0000 meq | EXTENDED_RELEASE_TABLET | Freq: Once | ORAL | Status: AC
  Administered 2017-10-24: 40 meq via ORAL
  Filled 2017-10-24: qty 2

## 2017-10-24 MED ORDER — POTASSIUM CHLORIDE IN NACL 20-0.9 MEQ/L-% IV SOLN
INTRAVENOUS | Status: DC
  Filled 2017-10-24: qty 1000

## 2017-10-24 MED ORDER — POTASSIUM CHLORIDE IN NACL 20-0.9 MEQ/L-% IV SOLN
INTRAVENOUS | Status: AC
  Administered 2017-10-24 – 2017-10-25 (×2): via INTRAVENOUS
  Filled 2017-10-24 (×2): qty 1000

## 2017-10-24 NOTE — Consult Note (Signed)
   [facility] Inpatient Consult   10/24/2017  Frank Larsen [birth year] [id number]   Patient screened for potential [facility] Care Management program services. Noted [place] Coordinator recently spoke with Mr. Kerrigan about [place] Management program services.   Spoke with Mr.Schroader, wife, and daughter at bedside about [place] Management program. Mr. Christmas states he is very familiar with [facility]. He states his HTA concierge contact person has given him a lot of information about [facility] Care Management. He also endorses that he spoke with [facility] just yesterday. Mr. Halm still states he does not need [facility] Care Management. Discussed follow up regarding DM management and education. Mr. Torelli states "that is not needed because I have had DM for a while".   Accepted [facility] Care Management brochure with contact information along with 24-hr nurse advice line magnet.   Expressed appreciation of visit.   Made inpatient RNCM aware patient declined [facility] Management services. Will make Telephonic RNCM aware of bedside encounter.   Marthenia Rolling, MSN-Ed, RN,BSN [facility] Liaison [phone]

## 2017-10-24 NOTE — Progress Notes (Signed)
[facility] PROGRESS NOTE    Progress Note  Frank Larsen  WUJ:[id number] DOB: [birth year] DOA: 10/23/2017 PCP: [name], MD     Brief Narrative:   Frank Larsen is an 72 y.o. male past medical history of metastatic prostate cancer on chemotherapy followed by [facility] who presents with complaint of diarrhea over the last 4 days, the day prior to admission he reported having 9 watery bowel movements, he relates that recently he received an injection for Neulasta.  He also relates abdominal pain using narcotics at home without any relief.  He also relates a 20 pound weight loss over the last several weeks.  With decreased appetite the ED he was found to have a white count of 3.8 hemoglobin of 9.9 creatinine of 1.4 magnesium of 1.2.  Assessment/Plan:   Acute colitis Colitis CT scan of the abdomen and pelvis done on 10/23/2017 showed enteritis with inflammation of the sigmoid and rectum. He was started empirically on IV Flagyl and ciprofloxacin.  C. dif PCR negative. Stool studies are pending, continue to monitor strict I's and O's. Discontinue telemetry.  Suspected ileus: Dilated loops on imaging, patient is having multiple bowel movement he is hungry and would like to eat has positive bowel sounds.  We will start him on a full liquid diet advance as tolerated I have a low threshold to advance his diet.  SIRS: No source of infection identified, he has resolved stool studies pending.  Antineoplastic induced anemia: With a mild drop in hemoglobin from 11-9.9 he has been typed and screened He denies melanotic stools or hematochezia. Check a CBC tomorrow morning after hydration if symptomatic can be transfused.  Acute kidney injury: With a baseline creatinine less than 1 on admission 1.4 likely prerenal in etiology he was started on IV fluid hydration creatinine now close to baseline.  hypokalemia: Replete potassium orally will recheck in the morning. Likely due to  diarrhea Decrease IV fluids will use normal saline with potassium.  Hypomagnesemia: On admission 1.2 he was given 2 g of mag IV, this morning 1.4 we will repeat orally recheck tomorrow morning.  Diabetes mellitus type II: With an A1c of 7.5 on 09/29/2017. Hold oral hypoglycemic agents. Continue CBGs AC and at bedtime we will use sliding scale insulin  Metastatic prostate cancer: We will follow-up with oncologist as an outpatient.   DVT prophylaxis: full Family Communication:none Disposition Plan/Barrier to D/C: His wife is having cataract surgery on 10/26/2017 and she has no transportation he would like to be home by then, so they can hopefully they will need to reschedule her cataract surgery Code Status:     Code Status Orders  (From admission, onward)        Start     Ordered   10/23/17 2210  Full code  Continuous     10/23/17 2213    Code Status History    This patient has a current code status but no historical code status.    Advance Directive Documentation     Most Recent Value  Type of Advance Directive  Healthcare Power of Attorney, Living will  Pre-existing out of facility DNR order (yellow form or pink MOST form)  -  "MOST" Form in Place?  -        IV Access:    Peripheral IV   Procedures and diagnostic studies:   Ct Abdomen Pelvis W Contrast  Result Date: 10/23/2017 CLINICAL DATA:  72 y/o M; abdominal pain, decreased appetite, and diarrhea. Cancer patient  with last treatment last Tuesday. EXAM: CT ABDOMEN AND PELVIS WITH CONTRAST TECHNIQUE: Multidetector CT imaging of the abdomen and pelvis was performed using the standard protocol following bolus administration of intravenous contrast. CONTRAST:  159mL ISOVUE-300 IOPAMIDOL (ISOVUE-300) INJECTION 61% COMPARISON:  10/09/2017 CT abdomen and pelvis. FINDINGS: Lower chest: Small right and trace left pleural effusions. Moderate coronary artery calcific atherosclerosis. Hepatobiliary: Subcentimeter segment 2  fluid attenuating structure compatible with cyst is stable. No other liver lesion identified. Normal gallbladder. Pancreas: Unremarkable. No pancreatic ductal dilatation or surrounding inflammatory changes. Spleen: Normal in size without focal abnormality. Adrenals/Urinary Tract: Adrenal glands are unremarkable. Kidneys are normal, without renal calculi, focal lesion, or hydronephrosis. Mild wall thickening of the bladder. Stomach/Bowel: Mild diffuse dilatation of the small bowel with bowel wall thickening and mucosal enhancement. Small bowel distention is overall mildly increased when compared with the prior CT of abdomen and pelvis. Additionally there is new mild air-filled distention of the colon with mild wall thickening of sigmoid colon and rectum. No evidence for perforation or abscess. Vascular/Lymphatic: Stable small volume of peritoneal ascites peritoneal nodularity. Abdominal aortic calcific atherosclerosis. Reproductive: Prostatectomy. Other: No abdominal wall hernia or abnormality. No abdominopelvic ascites. Musculoskeletal: No fracture is seen. IMPRESSION: 1. Persistent diffuse nonspecific enteritis and new inflammation of sigmoid colon and rectum. Mild interval distention of small bowel and colon without focal obstruction probably representing ileus. 2. Stable peritoneal carcinomatosis and ascites. 3. Stable small right and trace left pleural effusions. Electronically Signed   By: Kristine Garbe M.D.   On: 10/23/2017 20:56   Dg Abdomen Acute W/chest  Result Date: 10/23/2017 CLINICAL DATA:  72 y/o M; abdominal pain, decreased appetite, diarrhea. History of cancer undergoing treatment. EXAM: DG ABDOMEN ACUTE W/ 1V CHEST COMPARISON:  10/09/2017 CT chest, abdomen, and pelvis. FINDINGS: Stable small right pleural effusion and basilar atelectasis. Right port catheter tip projects over mid SVC. Stable cardiac silhouette. Mild aortic calcific atherosclerosis. Rotator cuff surgical repair the  right shoulder. Diffusely dilated loops of small bowel with fluid levels may represent ileus or obstruction. IMPRESSION: 1. Diffusely dilated loops of small bowel with fluid levels may represent ileus or obstruction. 2. Stable small right pleural effusion. Electronically Signed   By: Kristine Garbe M.D.   On: 10/23/2017 18:42     Medical Consultants:    None.  Anti-Infectives:   Cipro and Flagyl  Subjective:    Frank Larsen he denies any melanotic stools, he also relates only one episode of watery bowel movement here in the hospital abdominal pain improved he is hungry would like to eat.  Objective:    Vitals:   10/23/17 2151 10/23/17 2154 10/23/17 2321 10/24/17 0655  BP: 132/85 124/65 134/74 110/66  Pulse: 80 83 79 78  Resp: 18 11 16 18   Temp:   98.1 F (36.7 C) 98.3 F (36.8 C)  TempSrc:   Oral Oral  SpO2: 98% 97% 100% 98%  Weight:   79 kg (174 lb 2.6 oz)   Height:   5\' 10"  (1.778 m)     Intake/Output Summary (Last 24 hours) at 10/24/2017 0702 Last data filed at 10/24/2017 0611 Gross per 24 hour  Intake 2123.34 ml  Output 1 ml  Net 2122.34 ml   Filed Weights   10/23/17 1711 10/23/17 2321  Weight: 73.5 kg (162 lb) 79 kg (174 lb 2.6 oz)    Exam: General exam: In no acute distress. Respiratory system: Good air movement and clear to auscultation. Cardiovascular system: S1 & S2  heard, RRR.  Gastrointestinal system: Abdomen is distended, soft nontender no rebound or guarding. Central nervous system: Alert and oriented. No focal neurological deficits. Extremities: No pedal edema. Skin: No rashes, lesions or ulcers Psychiatry: Judgement and insight appear normal. Mood & affect appropriate.    Data Reviewed:    Labs: Basic Metabolic Panel: Recent Labs  Lab 10/17/17 0732 10/23/17 1752 10/24/17 0238  NA 138 136 138  K 3.8 2.9* 3.1*  CL 102 100* 104  CO2 23 22 22   GLUCOSE 143* 164* 111*  BUN 21 41* 32*  CREATININE 0.95 1.47* 1.08  CALCIUM  9.2 8.6* 8.0*  MG  --  1.2* 1.4*   GFR Estimated Creatinine Clearance: 63.8 mL/min (by C-G formula based on SCr of 1.08 mg/dL). Liver Function Tests: Recent Labs  Lab 10/17/17 0732 10/23/17 1752  AST 19 29  ALT 15 25  ALKPHOS 65 60  BILITOT 0.3 0.8  PROT 6.8 6.5  ALBUMIN 3.0* 3.1*   Recent Labs  Lab 10/23/17 1752  LIPASE 27   No results for input(s): AMMONIA in the last 168 hours. Coagulation profile No results for input(s): INR, PROTIME in the last 168 hours.  CBC: Recent Labs  Lab 10/17/17 0732 10/23/17 1752 10/24/17 0238  WBC 9.4 3.8* 3.8*  NEUTROABS 7.4* 2.8  --   HGB 11.0* 9.9* 8.8*  HCT 34.4* 30.0* 27.3*  MCV 86.0 83.8 85.0  PLT 449* 293 248   Cardiac Enzymes: No results for input(s): CKTOTAL, CKMB, CKMBINDEX, TROPONINI in the last 168 hours. BNP (last 3 results) No results for input(s): PROBNP in the last 8760 hours. CBG: Recent Labs  Lab 10/23/17 2340  GLUCAP 88   D-Dimer: No results for input(s): DDIMER in the last 72 hours. Hgb A1c: No results for input(s): HGBA1C in the last 72 hours. Lipid Profile: No results for input(s): CHOL, HDL, LDLCALC, TRIG, CHOLHDL, LDLDIRECT in the last 72 hours. Thyroid function studies: No results for input(s): TSH, T4TOTAL, T3FREE, THYROIDAB in the last 72 hours.  Invalid input(s): FREET3 Anemia work up: No results for input(s): VITAMINB12, FOLATE, FERRITIN, TIBC, IRON, RETICCTPCT in the last 72 hours. Sepsis Labs: Recent Labs  Lab 10/17/17 0732 10/23/17 1752 10/24/17 0015 10/24/17 0238  WBC 9.4 3.8*  --  3.8*  LATICACIDVEN  --   --  0.9  --    Microbiology Recent Results (from the past 240 hour(s))  C difficile quick scan w PCR reflex     Status: None   Collection Time: 10/23/17 10:35 PM  Result Value Ref Range Status   C Diff antigen NEGATIVE NEGATIVE Final   C Diff toxin NEGATIVE NEGATIVE Final   C Diff interpretation No C. difficile detected.  Final    Comment: Performed at [facility] [place] [address] [place], [place] [address]     Medications:    Continuous Infusions: . sodium chloride 100 mL/hr at 10/24/17 0014  . ciprofloxacin Stopped (10/24/17 0112)  . metronidazole 500 mg (10/24/17 9242)      LOS: 1 day   Charlynne Cousins  [facility] Pager [phone]  *Please refer to [email or web address], password [email or web address] to get updated schedule on who will round on this patient, as hospitalists switch teams weekly. If 7PM-7AM, please contact night-coverage at [email or web address], password [email or web address] for any overnight needs.  10/24/2017, 7:02 AM

## 2017-10-24 NOTE — Progress Notes (Signed)
Initial Nutrition Assessment  DOCUMENTATION CODES:   Severe malnutrition in context of acute illness/injury  INTERVENTION:  - Will order Boost Breeze BID, each supplement provides 250 kcal and 9 grams of protein. - Will order daily multivitamin with minerals.  - Diet advancement as medically feasible. - Continue to encourage PO intakes.   NUTRITION DIAGNOSIS:   Severe Malnutrition related to acute illness, catabolic illness, cancer and cancer related treatments as evidenced by mild fat depletion, moderate fat depletion, mild muscle depletion, moderate muscle depletion, percent weight loss.  GOAL:   Patient will meet greater than or equal to 90% of their needs  MONITOR:   PO intake, Supplement acceptance, Diet advancement, Weight trends, Labs, I & O's  REASON FOR ASSESSMENT:   Malnutrition Screening Tool  ASSESSMENT:   72 y.o. male past medical history of metastatic prostate cancer on chemotherapy followed by [facility] who presents with complaint of diarrhea over the last 4 days, the day prior to admission he reported having 9 watery bowel movements, he relates that recently he received an injection for Neulasta. He also relates abdominal pain using narcotics at home without any relief. He also relates a 20 pound weight loss over the last several weeks with decreased appetite. In the ED he was found to have a white count of 3.8, hemoglobin of 9.9, creatinine of 1.4, and magnesium of 1.2.  BMI indicates borderline overweight status. Patient on FLD and reports that for breakfast he had cream of tomato soup, V8 juice, and coffee. For lunch he plans to order tomato soup, V8 juice, cherry italian ice, and chocolate pudding. Patient denies any nausea except x1 with 1 episode of emesis at the beginning of the month. He was prescribed antinausea medication and has "not cracked the seal, but I have not had any more nausea after that one time."   He reports that he has had ongoing abdominal pain  since 5/1 and that his wife encouraged him to come to the hospital since that time but "I have been stubborn." He reports that he did come to the ED on 6/3 and had been scheduled for a CT on 6/11 so it was done while he was in the ED. Findings reported to him of recurrent prostate cancer--he had been told as recently as October 2018 that cancer was in remission. He has his first dose of chemo on 6/12 and had Neulasta injection on 6/13. Since that time he has been having 8-10 bouts of diarrhea per day. He reports that today it has been hours between episodes rather than episodes occurring nearly hourly. He also reports that abdominal pain had previously been 12/10 but is now 1-2/10.   Patient states that over the past 18 months appetite had been decreased but weight had been stable at his UBW of 185-190 lbs. He reports that in the past 2 weeks appetite has significantly decreased and he was eating very minimally and the 2-3 days PTA he did not eat anything. He reports that during that time frame he noted weight loss (weighs frequently at home) and that on the date of admission he weighed himself and he was 171 lbs.   NFPE outlined below. Based on current weight and weights in the chart, he has lost 7 lbs (4% body weight) in the past 12 days; this is significant for time frame. Patient has had ONS at home and his wife has also made protein shakes with fruit, milk, and protein powder. Discussed ONS available here and he is  interested in Boost Breeze BID. Patient's wife asks about carbohydrates/sugar and its relation to "feeding" cancer. Gently discussed this with her and that current literature does not show a direct correlation; did encourage whole wheat options over refined grains. Wife very appreciative. Patient reports HgbA1c typically under 7% but that most recent check was done after he and wife returned from a [place] river cruise on which they ate and drank more than usual.    Medications reviewed;  sliding scale Novolog, 3 units Novolog TID, 400 mg Mag-ox BID, 2 g IV Mg sulfate x1 run yesterday, 10 mEq IV KCl x1 run yesterday and x4 runs today, 40 mEq oral KCl x1 dose today. Labs reviewed; K: 3.1 mmol/L, BUN: 32 mg/dL, Ca: 8 mg/dL, Mg: 1.4 mg/dL, HgbA1c: 7.5% on 5/24.  IVF: NS-20 mEq KCl @ 75 mL/hr.      NUTRITION - FOCUSED PHYSICAL EXAM:    Most Recent Value  Orbital Region  No depletion  Upper Arm Region  Moderate depletion  Thoracic and Lumbar Region  No depletion  Buccal Region  Mild depletion  Temple Region  Mild depletion  Clavicle Bone Region  Moderate depletion  Clavicle and Acromion Bone Region  Moderate depletion  Scapular Bone Region  Mild depletion  Dorsal Hand  Mild depletion  Patellar Region  Unable to assess  Anterior Thigh Region  Unable to assess  Posterior Calf Region  Unable to assess  Edema (RD Assessment)  Unable to assess  Hair  Reviewed  Eyes  Reviewed  Mouth  Reviewed  Skin  Reviewed  Nails  Reviewed       Diet Order:   Diet Order           Diet full liquid Room service appropriate? Yes; Fluid consistency: Thin  Diet effective now          EDUCATION NEEDS:   Education needs have been addressed  Skin:  Skin Assessment: Reviewed RN Assessment  Last BM:  6/17  Height:   Ht Readings from Last 1 Encounters:  10/23/17 5\' 10"  (1.778 m)    Weight:   Wt Readings from Last 1 Encounters:  10/23/17 174 lb 2.6 oz (79 kg)    Ideal Body Weight:  75.45 kg  BMI:  Body mass index is 24.99 kg/m.  Estimated Nutritional Needs:   Kcal:  5277-8242 (28-30 kcal/kg)  Protein:  110-120 grams  Fluid:  >/= 2.2 L/day     Jarome Matin, MS, RD, LDN, [facility] Inpatient Clinical Dietitian Pager # [phone] After hours/weekend pager # [phone]

## 2017-10-25 ENCOUNTER — Other Ambulatory Visit: Payer: Self-pay

## 2017-10-25 DIAGNOSIS — E43 Unspecified severe protein-calorie malnutrition: Secondary | ICD-10-CM

## 2017-10-25 DIAGNOSIS — K529 Noninfective gastroenteritis and colitis, unspecified: Secondary | ICD-10-CM

## 2017-10-25 LAB — BASIC METABOLIC PANEL
Anion gap: 7 (ref 5–15)
BUN: 11 mg/dL (ref 6–20)
CHLORIDE: 109 mmol/L (ref 101–111)
CO2: 24 mmol/L (ref 22–32)
CREATININE: 0.8 mg/dL (ref 0.61–1.24)
Calcium: 8.2 mg/dL — ABNORMAL LOW (ref 8.9–10.3)
GFR calc Af Amer: >60 mL/min (ref >60)
GFR calc non Af Amer: >60 mL/min (ref >60)
GLUCOSE: 130 mg/dL — AB (ref 65–99)
Potassium: 3.5 mmol/L (ref 3.5–5.1)
Sodium: 140 mmol/L (ref 135–145)

## 2017-10-25 LAB — GLUCOSE, CAPILLARY
GLUCOSE-CAPILLARY: 129 mg/dL — AB (ref 65–99)
GLUCOSE-CAPILLARY: 163 mg/dL — AB (ref 65–99)
Glucose-Capillary: 139 mg/dL — ABNORMAL HIGH (ref 65–99)
Glucose-Capillary: 141 mg/dL — ABNORMAL HIGH (ref 65–99)
Glucose-Capillary: 136 mg/dL — ABNORMAL HIGH (ref 65–99)

## 2017-10-25 LAB — MAGNESIUM: MAGNESIUM: 1.4 mg/dL — AB (ref 1.7–2.4)

## 2017-10-25 MED ORDER — METRONIDAZOLE 500 MG PO TABS
500.0000 mg | ORAL_TABLET | Freq: Three times a day (TID) | ORAL | Status: DC
  Administered 2017-10-25 – 2017-10-26 (×3): 500 mg via ORAL
  Filled 2017-10-25 (×3): qty 1

## 2017-10-25 MED ORDER — MAGNESIUM SULFATE 4 GM/100ML IV SOLN
4.0000 g | Freq: Once | INTRAVENOUS | Status: AC
  Administered 2017-10-25: 4 g via INTRAVENOUS
  Filled 2017-10-25: qty 100

## 2017-10-25 MED ORDER — LOPERAMIDE HCL 2 MG PO CAPS
2.0000 mg | ORAL_CAPSULE | ORAL | Status: DC | PRN
Start: 2017-10-25 — End: 2017-10-26
  Administered 2017-10-25: 2 mg via ORAL
  Filled 2017-10-25: qty 1

## 2017-10-25 MED ORDER — CALCIUM CARBONATE ANTACID 500 MG PO CHEW
1.0000 | CHEWABLE_TABLET | Freq: Two times a day (BID) | ORAL | Status: DC
  Administered 2017-10-25 – 2017-10-26 (×2): 200 mg via ORAL
  Filled 2017-10-25 (×2): qty 1

## 2017-10-25 MED ORDER — CIPROFLOXACIN HCL 500 MG PO TABS
500.0000 mg | ORAL_TABLET | Freq: Two times a day (BID) | ORAL | Status: DC
  Administered 2017-10-25: 500 mg via ORAL
  Filled 2017-10-25: qty 1

## 2017-10-25 NOTE — Progress Notes (Signed)
Report was received from the previous nurse and I agree with her assessment of the patient. I will continue to monitor the patient.

## 2017-10-25 NOTE — Patient Outreach (Signed)
[facility]) Care Management  10/25/2017  Frank Larsen 10-19-1945 [id number]   Received update from Marthenia Rolling, RN hospital liaison that patient is currently at [facility].   She has offered [facility] care management services to patient and he has declined.  PLAN; RNCM will close patient to services due to refusal. RNCM will send closure notification to patients primary MD.   Quinn Plowman RN,BSN,CCM [facility] Telephonic  [phone]

## 2017-10-25 NOTE — Progress Notes (Signed)
[facility] Progress Note  Patient: Frank Larsen IDP:[id number]   PCP: [name], MD DOB: 04-15-1946   DOA: 10/23/2017   DOS: 10/25/2017   Date of Service: the patient was seen and examined on 10/25/2017  Subjective: feeling better, had 9 BM last night and 3 so far this AM. No blood in stool, abdominal pain is getting better.   Brief hospital course: Pt. with PMH of metastatic prostate cancer on chemotherapy followed by Dr. Alen Blew, HLD, DM type 2, and GERD; admitted on 10/23/2017, presented with complaint of diarrhea, was found to have drug-induced diarrhea. Currently further plan is continue close monitoring.  Assessment and Plan: 1.  Acute colitis, likely secondary to chemotherapy. CT abdomen and pelvis 19 shows diffuse nonspecific enteritis as well as inflammation of the colon and rectum. Denies having any abdominal pain right now, no blood in the stool.  Still has significant amount of diarrhea. PCR is negative. GI pathogen panel negative. Tolerating oral diet, no evidence of active infection. Started on Cipro and Flagyl based on the allergy since admission. At present switch to oral. Advance to soft diet. Patient is on demand, Taxol, Paraplatin for his prostate cancer. Taxol is of the new medication and it is associated with diarrhea in 38%. Suspect that this is associated with patient's presentation with diarrhea. Will use PRN Imodium.  2.  SIRS Acute kidney injury Dehydration Hypokalemia Hypomagnesemia. Secondary to severe dehydration from diarrhea. Renal function back to normal, SIRS resolved. Potassium and magnesium repleted. We will monitor daily.  3.  Chemotherapy-induced anemia. H&H relatively stable.  No active bleeding.  Monitor.  4.  Type 2 diabetes mellitus. Controlled. On oral hypoglycemic agent, currently on hold. Hemoglobin A1c 7.5 on May 2019. Continue sliding scale insulin for now.  5.  Prostate cancer. Chemotherapy-induced diarrhea. At  present close monitoring, outpatient follow-up with primary oncologist.  Diet: soft diet DVT Prophylaxis: subcutaneous Heparin Advance goals of care discussion: full code  Family Communication: no family was present at bedside, at the time of interview.   Disposition:  Discharge to home tomrrow if diarrhea is better.  Consultants: none Procedures: nonoe  Antibiotics: Anti-infectives (From admission, onward)   Start     Dose/Rate Route Frequency Ordered Stop   10/25/17 2000  ciprofloxacin (CIPRO) tablet 500 mg     500 mg Oral 2 times daily 10/25/17 1005     10/25/17 1400  metroNIDAZOLE (FLAGYL) tablet 500 mg     500 mg Oral Every 8 hours 10/25/17 1005     10/23/17 2245  metroNIDAZOLE (FLAGYL) IVPB 500 mg  Status:  Discontinued     500 mg 100 mL/hr over 60 Minutes Intravenous Every 8 hours 10/23/17 2225 10/25/17 1004   10/23/17 2245  ciprofloxacin (CIPRO) IVPB 400 mg  Status:  Discontinued     400 mg 200 mL/hr over 60 Minutes Intravenous 2 times daily 10/23/17 2225 10/25/17 1004       Objective: Physical Exam: Vitals:   10/24/17 2120 10/25/17 0110 10/25/17 0548 10/25/17 1352  BP: 134/72 (!) 104/58 136/76 137/70  Pulse: 75 80 82 75  Resp: 18 17 16 18   Temp: 98.1 F (36.7 C) 98.4 F (36.9 C) 97.8 F (36.6 C) 97.9 F (36.6 C)  TempSrc: Oral Oral Oral Oral  SpO2: 98% 96% 100% 97%  Weight:      Height:        Intake/Output Summary (Last 24 hours) at 10/25/2017 1622 Last data filed at 10/25/2017 0538 Gross per 24 hour  Intake 2034.17 ml  Output -  Net 2034.17 ml   Filed Weights   10/23/17 1711 10/23/17 2321  Weight: 73.5 kg (162 lb) 79 kg (174 lb 2.6 oz)   General: Alert, Awake and Oriented to Time, Place and Person. Appear in moderate distress, affect appropriate Eyes: PERRL, Conjunctiva normal ENT: Oral Mucosa clear moist Neck: no JVD, no Abnormal Mass Or lumps Cardiovascular: S1 and S2 Present, no Murmur, Peripheral Pulses Present Respiratory: normal  respiratory effort, Bilateral Air entry equal and Decreased, no use of accessory muscle, Clear to Auscultation, no Crackles, no wheezes Abdomen: Bowel Sound present, Soft and mild tenderness, no hernia Skin: no redness, no Rash, no induration Extremities: no Pedal edema, no calf tenderness Neurologic: Grossly no focal neuro deficit. Bilaterally Equal motor strength  Data Reviewed: CBC: Recent Labs  Lab 10/23/17 1752 10/24/17 0238  WBC 3.8* 3.8*  NEUTROABS 2.8  --   HGB 9.9* 8.8*  HCT 30.0* 27.3*  MCV 83.8 85.0  PLT 293 825   Basic Metabolic Panel: Recent Labs  Lab 10/23/17 1752 10/24/17 0238 10/25/17 0424  NA 136 138 140  K 2.9* 3.1* 3.5  CL 100* 104 109  CO2 22 22 24   GLUCOSE 164* 111* 130*  BUN 41* 32* 11  CREATININE 1.47* 1.08 0.80  CALCIUM 8.6* 8.0* 8.2*  MG 1.2* 1.4* 1.4*    Liver Function Tests: Recent Labs  Lab 10/23/17 1752  AST 29  ALT 25  ALKPHOS 60  BILITOT 0.8  PROT 6.5  ALBUMIN 3.1*   Recent Labs  Lab 10/23/17 1752  LIPASE 27   No results for input(s): AMMONIA in the last 168 hours. Coagulation Profile: No results for input(s): INR, PROTIME in the last 168 hours. Cardiac Enzymes: No results for input(s): CKTOTAL, CKMB, CKMBINDEX, TROPONINI in the last 168 hours. BNP (last 3 results) No results for input(s): PROBNP in the last 8760 hours. CBG: Recent Labs  Lab 10/24/17 1656 10/24/17 2117 10/25/17 0546 10/25/17 0816 10/25/17 1234  GLUCAP 147* 124* 129* 139* 141*   Studies: No results found.  Scheduled Meds: . calcium carbonate  1 tablet Oral BID WC  . ciprofloxacin  500 mg Oral BID  . feeding supplement  1 Container Oral BID BM  . insulin aspart  0-9 Units Subcutaneous TID WC  . insulin aspart  3 Units Subcutaneous TID WC  . metroNIDAZOLE  500 mg Oral Q8H  . multivitamin with minerals  1 tablet Oral Daily   Continuous Infusions: PRN Meds: acetaminophen **OR** acetaminophen, albuterol, fentaNYL (SUBLIMAZE) injection,  loperamide, ondansetron **OR** ondansetron (ZOFRAN) IV, sodium chloride flush  Time spent: 35 minutes  Author: Berle Mull, MD [facility] Pager: [phone] 10/25/2017 4:22 PM  If 7PM-7AM, please contact night-coverage at [email or web address], password [facility]

## 2017-10-26 ENCOUNTER — Telehealth: Payer: Self-pay | Admitting: *Deleted

## 2017-10-26 ENCOUNTER — Ambulatory Visit: Payer: PPO | Admitting: Oncology

## 2017-10-26 LAB — CBC
HEMATOCRIT: 29.5 % — AB (ref 39.0–52.0)
Hemoglobin: 9.2 g/dL — ABNORMAL LOW (ref 13.0–17.0)
MCH: 27.6 pg (ref 26.0–34.0)
MCHC: 31.2 g/dL (ref 30.0–36.0)
MCV: 88.6 fL (ref 78.0–100.0)
PLATELETS: 285 10*3/uL (ref 150–400)
RBC: 3.33 MIL/uL — AB (ref 4.22–5.81)
RDW: 14.1 % (ref 11.5–15.5)
WBC: 12.9 10*3/uL — ABNORMAL HIGH (ref 4.0–10.5)

## 2017-10-26 LAB — BASIC METABOLIC PANEL
Anion gap: 7 (ref 5–15)
BUN: 5 mg/dL — AB (ref 6–20)
CHLORIDE: 112 mmol/L — AB (ref 101–111)
CO2: 23 mmol/L (ref 22–32)
Calcium: 8.5 mg/dL — ABNORMAL LOW (ref 8.9–10.3)
Creatinine, Ser: 0.79 mg/dL (ref 0.61–1.24)
Glucose, Bld: 150 mg/dL — ABNORMAL HIGH (ref 65–99)
POTASSIUM: 3.4 mmol/L — AB (ref 3.5–5.1)
SODIUM: 142 mmol/L (ref 135–145)

## 2017-10-26 LAB — GLUCOSE, CAPILLARY
GLUCOSE-CAPILLARY: 147 mg/dL — AB (ref 65–99)
Glucose-Capillary: 195 mg/dL — ABNORMAL HIGH (ref 65–99)

## 2017-10-26 LAB — MAGNESIUM: Magnesium: 1.6 mg/dL — ABNORMAL LOW (ref 1.7–2.4)

## 2017-10-26 MED ORDER — HEPARIN SOD (PORK) LOCK FLUSH 100 UNIT/ML IV SOLN
500.0000 [IU] | INTRAVENOUS | Status: AC | PRN
  Administered 2017-10-26: 500 [IU]

## 2017-10-26 MED ORDER — MAGNESIUM CHLORIDE 64 MG PO TBEC
1.0000 | DELAYED_RELEASE_TABLET | Freq: Two times a day (BID) | ORAL | Status: DC
  Administered 2017-10-26: 64 mg via ORAL
  Filled 2017-10-26: qty 1

## 2017-10-26 MED ORDER — MAGNESIUM CHLORIDE 64 MG PO TBEC: 1.0000 | DELAYED_RELEASE_TABLET | Freq: Two times a day (BID) | ORAL | 0 refills | Status: DC

## 2017-10-26 MED ORDER — CALCIUM CARBONATE ANTACID 500 MG PO CHEW: 1.0000 | CHEWABLE_TABLET | Freq: Two times a day (BID) | ORAL | 0 refills | Status: AC | PRN

## 2017-10-26 MED ORDER — MAGNESIUM SULFATE 4 GM/100ML IV SOLN
4.0000 g | Freq: Once | INTRAVENOUS | Status: AC
  Administered 2017-10-26: 4 g via INTRAVENOUS
  Filled 2017-10-26: qty 100

## 2017-10-26 NOTE — Telephone Encounter (Signed)
Spoke with wife linda. Per dr Alen Blew, okay to cancel lab and flush appt on 10/30/17.

## 2017-10-26 NOTE — Care Management Important Message (Signed)
Important Message  Patient Details  Name: ARLEY SALAMONE MRN: [id number] Date of Birth: 1946/04/01   Medicare Important Message Given:  Yes    Kerin Salen 10/26/2017, 11:29 AMImportant Message  Patient Details  Name: STARR ENGEL MRN: [id number] Date of Birth: [birth year]   Medicare Important Message Given:  Yes    Kerin Salen 10/26/2017, 11:29 AM

## 2017-10-26 NOTE — Telephone Encounter (Signed)
Yes

## 2017-10-26 NOTE — Progress Notes (Signed)
Patient given discharge, follow up, and medication instructions, verbalized understanding, IV team paged to de-access port, personal belongings with patient, family to transport home

## 2017-10-26 NOTE — Telephone Encounter (Signed)
Patient is currently admitted and has had cbc,c-met and magnesium drawn today. Okay to cancel his lab and flush appt for 10-30-17 prior to visit with dr Alen Blew?

## 2017-10-27 ENCOUNTER — Ambulatory Visit: Payer: PPO | Admitting: Oncology

## 2017-10-29 LAB — CULTURE, BLOOD (ROUTINE X 2)
Culture: NO GROWTH
Special Requests: ADEQUATE

## 2017-10-29 LAB — CULTURE, BLOOD (SINGLE)
CULTURE: NO GROWTH
Special Requests: ADEQUATE

## 2017-10-29 NOTE — Discharge Summary (Signed)
[facility] Discharge Summary   Patient: Frank Larsen OVF:[id number]   PCP: Frank Contras, MD DOB: Sep 21, 1945   Date of admission: 10/23/2017   Date of discharge: 10/26/2017    Discharge Diagnoses:  Principal Problem:   Colitis Active Problems:   Prostate CA ([place])   Hypokalemia   Hypomagnesemia   Acute kidney injury superimposed on CKD ([place])   Ileus ([place])   Pancytopenia ([place])   Diabetes mellitus type II, uncontrolled ([place])   Antineoplastic chemotherapy induced anemia   Protein-calorie malnutrition, severe   Admitted From: home Disposition:  home  Recommendations for Outpatient Follow-up:  1. Please  follow-up with PCP in 1 week.  Follow-up Information    Frank Contras, MD. Schedule an appointment as soon as possible for a visit in 1 week(s).   Specialty:  Family Medicine Contact information: [place] [phone] [phone]          Diet recommendation: soft diet  Activity: The patient is advised to gradually reintroduce usual activities.  Discharge Condition: good  Code Status: full code  History of present illness: As per the H and P dictated on admission, "Frank Larsen is a 72 y.o. male with medical history significant of metastatic prostate cancer on chemotherapy followed by Frank Larsen, HLD, DM type 2, and GERD; who presents with complaints of diarrhea over the last 4 days.  Today in particular he reports having 9 episodes of diarrhea.  He relates symptoms to recently given Neulasta injection.  In addition to these acute symptoms he reports having progressively worsening lower abdominal pain that he describes as ripping in nature over the last 3 weeks.  He has been trying to use hydrocodone without relief.  Associated symptoms weight loss of 20 to 25 pounds in last 1/2 weeks, generalized malaise, no appetite, and decreased oral intake.  Denies any significant fever, vomiting, or recent sick contacts.   ED Course: Upon  admission to the emergency department patient was noted to be afebrile, heart rates 80-1 05, and all other vital signs relatively within normal limits.  Labs revealed WBC 3.8, hemoglobin 9.9, potassium 2.9, BUN 41, creatinine 1.47, and magnesium 1.2.  Patient was given 1 L of normal saline IV fluids with 10 mEq of potassium chloride.  No initial lactic acid was obtained.  GI studies were ordered."  Hospital Course:  Summary of his active problems in the hospital is as following. 1.  Acute colitis, likely secondary to chemotherapy. CT abdomen and pelvis 19 shows diffuse nonspecific enteritis as well as inflammation of the colon and rectum. Denies having any abdominal pain right now, no blood in the stool.  Still has significant amount of diarrhea. PCR is negative. GI pathogen panel negative. Tolerating oral diet, no evidence of active infection. Started on Cipro and Flagyl based on the allergy since admission. At present we will stop the antibiotics. Patient is on  Taxol, for his prostate cancer. Taxol is one of the new medication for pt and it is associated with diarrhea in 38%. Suspect that this is associated with patient's presentation with diarrhea. Will use PRN Imodium.  2.  SIRS Acute kidney injury Dehydration Hypokalemia Hypomagnesemia. Secondary to severe dehydration from diarrhea. Renal function back to normal, SIRS resolved. Potassium and magnesium repleted.  3.  Chemotherapy-induced anemia. H&H relatively stable.  No active bleeding.  Monitor.  4.  Type 2 diabetes mellitus. Controlled. On oral hypoglycemic agent, currently on hold. Hemoglobin A1c 7.5 on May 2019. Continue  home regimen  5.  Prostate cancer. Chemotherapy-induced diarrhea. At present close monitoring, outpatient follow-up with primary oncologist.  All other chronic medical condition were stable during the hospitalization.  Patient was ambulatory without any assistance. On the day of the discharge  the patient's vitals were stable , and no other acute medical condition were reported by patient. the patient was felt safe to be discharge at home with family.  Consultants: none Procedures: none  DISCHARGE MEDICATION: Allergies as of 10/26/2017      Reactions   Scallops [shellfish Allergy] Nausea And Vomiting   Cephalexin Hives, Rash      Medication List    TAKE these medications   atorvastatin 10 MG tablet Commonly known as:  LIPITOR Take 10 mg by mouth daily with breakfast.   calcium carbonate 500 MG chewable tablet Commonly known as:  TUMS - dosed in mg elemental calcium Chew 1 tablet (200 mg of elemental calcium total) by mouth 2 (two) times daily as needed for indigestion or heartburn.   glipiZIDE 5 MG 24 hr tablet Commonly known as:  GLUCOTROL XL Take 1 tablet (5 mg total) by mouth daily with breakfast.   glucose blood test strip Commonly known as:  ONE TOUCH ULTRA TEST Use to check blood sugar 2 times per day.   HYDROcodone-acetaminophen 5-325 MG tablet Commonly known as:  NORCO/VICODIN Take 1-2 tablets by mouth every 4 (four) hours as needed. What changed:  reasons to take this   hydrOXYzine 10 MG tablet Commonly known as:  ATARAX/VISTARIL TAKE 1 TABLET BY MOUTH EVERY 6 HOURS AS NEEDED What changed:    how much to take  how to take this  when to take this   lidocaine-prilocaine cream Commonly known as:  EMLA Apply 1 application topically as needed. Apply to port before chemotherapy.   loperamide 2 MG tablet Commonly known as:  IMODIUM A-D Take 2 mg by mouth 4 (four) times daily as needed for diarrhea or loose stools.   magnesium chloride 64 MG Tbec SR tablet Commonly known as:  SLOW-MAG Take 1 tablet (64 mg total) by mouth 2 (two) times daily.   metFORMIN 1000 MG tablet Commonly known as:  GLUCOPHAGE Take 1 tablet (1,000 mg total) by mouth 2 (two) times daily with a meal.   multivitamin tablet Take 1 tablet by mouth daily.   naproxen sodium  220 MG tablet Commonly known as:  ALEVE Take 440 mg by mouth 2 (two) times daily as needed (pain).   omeprazole 20 MG capsule Commonly known as:  PRILOSEC Take 20 mg by mouth every morning.   ONE TOUCH ULTRA 2 w/Device Kit Use to check blood sugar 2 times per day.   ONETOUCH DELICA LANCETS 09O Misc Use to check blood sugar 2 times per day.   prochlorperazine 10 MG tablet Commonly known as:  COMPAZINE Take 1 tablet (10 mg total) by mouth every 6 (six) hours as needed for nausea or vomiting.   pseudoephedrine-acetaminophen 30-500 MG Tabs tablet Commonly known as:  TYLENOL SINUS Take 1 tablet by mouth daily as needed (congestion).   sitaGLIPtin 100 MG tablet Commonly known as:  JANUVIA Take 1 tablet (100 mg total) by mouth daily.      Allergies  Allergen Reactions  . Scallops [Shellfish Allergy] Nausea And Vomiting  . Cephalexin Hives and Rash   Discharge Instructions    Diet - low sodium heart healthy   Complete by:  As directed    Discharge instructions   Complete by:  As directed    It is important that you read following instructions as well as go over your medication list with RN to help you understand your care after this hospitalization.  Discharge Instructions: Please follow-up with PCP in one week  Please request your primary care physician to go over all Hospital Tests and Procedure/Radiological results at the follow up,  Please get all Hospital records sent to your PCP by signing hospital release before you go home.   Do not take more than prescribed Pain, Sleep and Anxiety Medications. You were cared for by a hospitalist during your hospital stay. If you have any questions about your discharge medications or the care you received while you were in the hospital after you are discharged, you can call the unit and ask to speak with the hospitalist on call if the hospitalist that took care of you is not available.  Once you are discharged, your primary care  physician will handle any further medical issues. Please note that NO REFILLS for any discharge medications will be authorized once you are discharged, as it is imperative that you return to your primary care physician (or establish a relationship with a primary care physician if you do not have one) for your aftercare needs so that they can reassess your need for medications and monitor your lab values. You Must read complete instructions/literature along with all the possible adverse reactions/side effects for all the Medicines you take and that have been prescribed to you. Take any new Medicines after you have completely understood and accept all the possible adverse reactions/side effects. Wear Seat belts while driving. If you have smoked or chewed Tobacco in the last 2 yrs please stop smoking and/or stop any Recreational drug use.   Increase activity slowly   Complete by:  As directed      Discharge Exam: Filed Weights   10/23/17 1711 10/23/17 2321  Weight: 73.5 kg (162 lb) 79 kg (174 lb 2.6 Frank)   Vitals:   10/25/17 2200 10/26/17 0521  BP: (!) 119/54 (!) 128/50  Pulse: 79 78  Resp: 18 18  Temp: 98.7 F (37.1 C) 97.8 F (36.6 C)  SpO2: 94% 97%   General: Appear in no distress, no Rash; Oral Mucosa moist. Cardiovascular: S1 and S2 Present, no Murmur, no JVD Respiratory: Bilateral Air entry present and Clear to Auscultation, no Crackles, no wheezes Abdomen: Bowel Sound present, Soft and nop tenderness Extremities: no Pedal edema, no calf tenderness Neurology: Grossly no focal neuro deficit.  The results of significant diagnostics from this hospitalization (including imaging, microbiology, ancillary and laboratory) are listed below for reference.    Significant Diagnostic Studies: Ct Chest W Contrast  Result Date: 10/09/2017 CLINICAL DATA:  Increasing abdominal pain. Prostate cancer with peritoneal metastatic disease EXAM: CT CHEST, ABDOMEN, AND PELVIS WITH CONTRAST TECHNIQUE:  Multidetector CT imaging of the chest, abdomen and pelvis was performed following the standard protocol during bolus administration of intravenous contrast. CONTRAST:  136m ISOVUE-300 IOPAMIDOL (ISOVUE-300) INJECTION 61% COMPARISON:  06/29/2017 FINDINGS: CT CHEST FINDINGS Cardiovascular: Normal heart size. No pericardial effusion. Aortic and coronary atherosclerosis. Mediastinum/Nodes: Interval enlargement of anterior juxta diaphragmatic lymph nodes, 16 mm short axis right para median. Right thyroid nodule, incidental given the clinical circumstances. Lungs/Pleura: Small and dependent right pleural effusion. Trace left pleural effusion. These are stable from prior. Calcified granuloma in the right lower lobe. Negative for pulmonary parenchymal metastasis. Musculoskeletal: Stable sclerosis in the lateral right eighth rib. No acute finding. CT ABDOMEN PELVIS  FINDINGS Hepatobiliary: 1 cm low-density in the left liver that is stable since 2017 and benign.No evidence of biliary obstruction or stone. Pancreas: Unremarkable. Spleen: Unremarkable. Adrenals/Urinary Tract: Negative adrenals. No hydronephrosis or stone. Unremarkable bladder. Stomach/Bowel: The distal small bowel shows diffuse low-density submucosal thickening with prominent serosal enhancement. No obstruction or perforation is seen. Major mesenteric vessels are patent Vascular/Lymphatic: Diffuse atheromatous wall thickening of the aorta and iliacs. Rounded presumed lymph node right peri aortic in the upper abdomen has enlarged to 14 mm diameter. Adenopathy versus is peritoneal nodularity along the ascending colon measuring 15 mm in diameter, progressed. Reproductive:Prostatectomy. No mass in the surgical bed Other: There is small volume generalized ascites with peritoneal thickening intermittently seen. Musculoskeletal: Subcentimeter sclerosis in the upper right ilium is stable. IMPRESSION: 1. Distal small bowel enteritis, nonspecific. 2. Known peritoneal  metastatic disease with progressive nodularity and new small ascites. Negative for obstruction. 3. New juxta diaphragmatic lymphadenopathy attributed to #2. 4. Right periaortic nodule, presumably adenopathy, new from prior. Electronically Signed   By: Monte Fantasia M.D.   On: 10/09/2017 10:37   Ct Abdomen Pelvis W Contrast  Result Date: 10/23/2017 CLINICAL DATA:  72 y/o M; abdominal pain, decreased appetite, and diarrhea. Cancer patient with last treatment last Tuesday. EXAM: CT ABDOMEN AND PELVIS WITH CONTRAST TECHNIQUE: Multidetector CT imaging of the abdomen and pelvis was performed using the standard protocol following bolus administration of intravenous contrast. CONTRAST:  166m ISOVUE-300 IOPAMIDOL (ISOVUE-300) INJECTION 61% COMPARISON:  10/09/2017 CT abdomen and pelvis. FINDINGS: Lower chest: Small right and trace left pleural effusions. Moderate coronary artery calcific atherosclerosis. Hepatobiliary: Subcentimeter segment 2 fluid attenuating structure compatible with cyst is stable. No other liver lesion identified. Normal gallbladder. Pancreas: Unremarkable. No pancreatic ductal dilatation or surrounding inflammatory changes. Spleen: Normal in size without focal abnormality. Adrenals/Urinary Tract: Adrenal glands are unremarkable. Kidneys are normal, without renal calculi, focal lesion, or hydronephrosis. Mild wall thickening of the bladder. Stomach/Bowel: Mild diffuse dilatation of the small bowel with bowel wall thickening and mucosal enhancement. Small bowel distention is overall mildly increased when compared with the prior CT of abdomen and pelvis. Additionally there is new mild air-filled distention of the colon with mild wall thickening of sigmoid colon and rectum. No evidence for perforation or abscess. Vascular/Lymphatic: Stable small volume of peritoneal ascites peritoneal nodularity. Abdominal aortic calcific atherosclerosis. Reproductive: Prostatectomy. Other: No abdominal wall hernia or  abnormality. No abdominopelvic ascites. Musculoskeletal: No fracture is seen. IMPRESSION: 1. Persistent diffuse nonspecific enteritis and new inflammation of sigmoid colon and rectum. Mild interval distention of small bowel and colon without focal obstruction probably representing ileus. 2. Stable peritoneal carcinomatosis and ascites. 3. Stable small right and trace left pleural effusions. Electronically Signed   By: LKristine GarbeM.D.   On: 10/23/2017 20:56   Ct Abdomen Pelvis W Contrast  Result Date: 10/09/2017 CLINICAL DATA:  Increasing abdominal pain. Prostate cancer with peritoneal metastatic disease EXAM: CT CHEST, ABDOMEN, AND PELVIS WITH CONTRAST TECHNIQUE: Multidetector CT imaging of the chest, abdomen and pelvis was performed following the standard protocol during bolus administration of intravenous contrast. CONTRAST:  1058mISOVUE-300 IOPAMIDOL (ISOVUE-300) INJECTION 61% COMPARISON:  06/29/2017 FINDINGS: CT CHEST FINDINGS Cardiovascular: Normal heart size. No pericardial effusion. Aortic and coronary atherosclerosis. Mediastinum/Nodes: Interval enlargement of anterior juxta diaphragmatic lymph nodes, 16 mm short axis right para median. Right thyroid nodule, incidental given the clinical circumstances. Lungs/Pleura: Small and dependent right pleural effusion. Trace left pleural effusion. These are stable from prior. Calcified granuloma in the  right lower lobe. Negative for pulmonary parenchymal metastasis. Musculoskeletal: Stable sclerosis in the lateral right eighth rib. No acute finding. CT ABDOMEN PELVIS FINDINGS Hepatobiliary: 1 cm low-density in the left liver that is stable since 2017 and benign.No evidence of biliary obstruction or stone. Pancreas: Unremarkable. Spleen: Unremarkable. Adrenals/Urinary Tract: Negative adrenals. No hydronephrosis or stone. Unremarkable bladder. Stomach/Bowel: The distal small bowel shows diffuse low-density submucosal thickening with prominent serosal  enhancement. No obstruction or perforation is seen. Major mesenteric vessels are patent Vascular/Lymphatic: Diffuse atheromatous wall thickening of the aorta and iliacs. Rounded presumed lymph node right peri aortic in the upper abdomen has enlarged to 14 mm diameter. Adenopathy versus is peritoneal nodularity along the ascending colon measuring 15 mm in diameter, progressed. Reproductive:Prostatectomy. No mass in the surgical bed Other: There is small volume generalized ascites with peritoneal thickening intermittently seen. Musculoskeletal: Subcentimeter sclerosis in the upper right ilium is stable. IMPRESSION: 1. Distal small bowel enteritis, nonspecific. 2. Known peritoneal metastatic disease with progressive nodularity and new small ascites. Negative for obstruction. 3. New juxta diaphragmatic lymphadenopathy attributed to #2. 4. Right periaortic nodule, presumably adenopathy, new from prior. Electronically Signed   By: Monte Fantasia M.D.   On: 10/09/2017 10:37   Dg Abdomen Acute W/chest  Result Date: 10/23/2017 CLINICAL DATA:  72 y/o M; abdominal pain, decreased appetite, diarrhea. History of cancer undergoing treatment. EXAM: DG ABDOMEN ACUTE W/ 1V CHEST COMPARISON:  10/09/2017 CT chest, abdomen, and pelvis. FINDINGS: Stable small right pleural effusion and basilar atelectasis. Right port catheter tip projects over mid SVC. Stable cardiac silhouette. Mild aortic calcific atherosclerosis. Rotator cuff surgical repair the right shoulder. Diffusely dilated loops of small bowel with fluid levels may represent ileus or obstruction. IMPRESSION: 1. Diffusely dilated loops of small bowel with fluid levels may represent ileus or obstruction. 2. Stable small right pleural effusion. Electronically Signed   By: Kristine Garbe M.D.   On: 10/23/2017 18:42    Microbiology: Recent Results (from the past 240 hour(s))  Gastrointestinal Panel by PCR , Stool     Status: None   Collection Time: 10/23/17  10:35 PM  Result Value Ref Range Status   Campylobacter species NOT DETECTED NOT DETECTED Final   Plesimonas shigelloides NOT DETECTED NOT DETECTED Final   Salmonella species NOT DETECTED NOT DETECTED Final   Yersinia enterocolitica NOT DETECTED NOT DETECTED Final   Vibrio species NOT DETECTED NOT DETECTED Final   Vibrio cholerae NOT DETECTED NOT DETECTED Final   Enteroaggregative E coli (EAEC) NOT DETECTED NOT DETECTED Final   Enteropathogenic E coli (EPEC) NOT DETECTED NOT DETECTED Final   Enterotoxigenic E coli (ETEC) NOT DETECTED NOT DETECTED Final   Shiga like toxin producing E coli (STEC) NOT DETECTED NOT DETECTED Final   Shigella/Enteroinvasive E coli (EIEC) NOT DETECTED NOT DETECTED Final   Cryptosporidium NOT DETECTED NOT DETECTED Final   Cyclospora cayetanensis NOT DETECTED NOT DETECTED Final   Entamoeba histolytica NOT DETECTED NOT DETECTED Final   Giardia lamblia NOT DETECTED NOT DETECTED Final   Adenovirus F40/41 NOT DETECTED NOT DETECTED Final   Astrovirus NOT DETECTED NOT DETECTED Final   Norovirus GI/GII NOT DETECTED NOT DETECTED Final   Rotavirus A NOT DETECTED NOT DETECTED Final   Sapovirus (I, II, IV, and V) NOT DETECTED NOT DETECTED Final    Comment: Performed at [facility], [address] [place], [place] [address]  C difficile quick scan w PCR reflex     Status: None   Collection Time: 10/23/17 10:35  PM  Result Value Ref Range Status   C Diff antigen NEGATIVE NEGATIVE Final   C Diff toxin NEGATIVE NEGATIVE Final   C Diff interpretation No C. difficile detected.  Final    Comment: Performed at [facility], [place] [address], [place], [place] [address]  Culture, blood (x 2)     Status: None (Preliminary result)   Collection Time: 10/23/17 11:44 PM  Result Value Ref Range Status   Specimen Description   Final    BLOOD RIGHT ANTECUBITAL Performed at [facility] [address], [place], [place] [address]     Special Requests   Final    BOTTLES DRAWN AEROBIC AND ANAEROBIC Blood Culture adequate volume Performed at [facility] [address] [place], [place] [address]    Culture   Final    NO GROWTH 4 DAYS Performed at [facility], [place] [address], [place], [place] [address]    Report Status PENDING  Incomplete  Culture, blood (single)     Status: None (Preliminary result)   Collection Time: 10/24/17 12:15 AM  Result Value Ref Range Status   Specimen Description   Final    BLOOD RIGHT PORT Performed at [facility] [address] [place], [place] [address]    Special Requests   Final    BOTTLES DRAWN AEROBIC AND ANAEROBIC Blood Culture adequate volume Performed at [facility] [address], [place], [place] [address]    Culture   Final    NO GROWTH 4 DAYS Performed at [facility], [place] [address] [place], [place] [address]    Report Status PENDING  Incomplete     Labs: CBC: Recent Labs  Lab 10/23/17 1752 10/24/17 0238 10/26/17 0624  WBC 3.8* 3.8* 12.9*  NEUTROABS 2.8  --   --   HGB 9.9* 8.8* 9.2*  HCT 30.0* 27.3* 29.5*  MCV 83.8 85.0 88.6  PLT 293 248 401   Basic Metabolic Panel: Recent Labs  Lab 10/23/17 1752 10/24/17 0238 10/25/17 0424 10/26/17 0624  NA 136 138 140 142  K 2.9* 3.1* 3.5 3.4*  CL 100* 104 109 112*  CO2 _0 GLUCOSE 164* 111* 130* 150*  BUN 41* 32* 11 5*  CREATININE 1.47* 1.08 0.80 0.79  CALCIUM 8.6* 8.0* 8.2* 8.5*  MG 1.2* 1.4* 1.4* 1.6*   Liver Function Tests: Recent Labs  Lab 10/23/17 1752  AST 29  ALT 25  ALKPHOS 60  BILITOT 0.8  PROT 6.5  ALBUMIN 3.1*   Recent Labs  Lab 10/23/17 1752  LIPASE 27   No results for input(s): AMMONIA in the last 168 hours. Cardiac Enzymes: No results for input(s): CKTOTAL, CKMB, CKMBINDEX, TROPONINI in the last 168 hours. BNP (last 3 results) No results for input(s): BNP in the last 8760 hours. CBG: Recent  Labs  Lab 10/25/17 1234 10/25/17 1723 10/25/17 2202 10/26/17 0730 10/26/17 1135  GLUCAP 141* 163* 136* 147* 195*   Time spent: 35 minutes  Signed:  Berle Mull  [facility] 10/26/2017 , 8:36 AM

## 2017-10-30 ENCOUNTER — Telehealth: Payer: Self-pay

## 2017-10-30 ENCOUNTER — Inpatient Hospital Stay (HOSPITAL_BASED_OUTPATIENT_CLINIC_OR_DEPARTMENT_OTHER): Payer: PPO | Admitting: Oncology

## 2017-10-30 ENCOUNTER — Other Ambulatory Visit: Payer: PPO

## 2017-10-30 VITALS — BP 126/62 | HR 85 | Temp 99.0°F | Resp 18 | Ht 70.0 in | Wt 179.4 lb

## 2017-10-30 DIAGNOSIS — Z79899 Other long term (current) drug therapy: Secondary | ICD-10-CM

## 2017-10-30 DIAGNOSIS — D649 Anemia, unspecified: Secondary | ICD-10-CM

## 2017-10-30 DIAGNOSIS — Z9221 Personal history of antineoplastic chemotherapy: Secondary | ICD-10-CM | POA: Diagnosis not present

## 2017-10-30 DIAGNOSIS — C786 Secondary malignant neoplasm of retroperitoneum and peritoneum: Secondary | ICD-10-CM | POA: Diagnosis not present

## 2017-10-30 DIAGNOSIS — C61 Malignant neoplasm of prostate: Secondary | ICD-10-CM | POA: Diagnosis not present

## 2017-10-30 DIAGNOSIS — E860 Dehydration: Secondary | ICD-10-CM | POA: Diagnosis not present

## 2017-10-30 DIAGNOSIS — E46 Unspecified protein-calorie malnutrition: Secondary | ICD-10-CM | POA: Diagnosis not present

## 2017-10-30 DIAGNOSIS — K529 Noninfective gastroenteritis and colitis, unspecified: Secondary | ICD-10-CM

## 2017-10-30 DIAGNOSIS — Z5111 Encounter for antineoplastic chemotherapy: Secondary | ICD-10-CM | POA: Diagnosis not present

## 2017-10-30 DIAGNOSIS — Z5189 Encounter for other specified aftercare: Secondary | ICD-10-CM | POA: Diagnosis not present

## 2017-10-30 DIAGNOSIS — E119 Type 2 diabetes mellitus without complications: Secondary | ICD-10-CM | POA: Diagnosis not present

## 2017-10-30 DIAGNOSIS — L299 Pruritus, unspecified: Secondary | ICD-10-CM

## 2017-10-30 DIAGNOSIS — R109 Unspecified abdominal pain: Secondary | ICD-10-CM | POA: Diagnosis not present

## 2017-10-30 DIAGNOSIS — R188 Other ascites: Secondary | ICD-10-CM

## 2017-10-30 DIAGNOSIS — J90 Pleural effusion, not elsewhere classified: Secondary | ICD-10-CM

## 2017-10-30 DIAGNOSIS — Z7984 Long term (current) use of oral hypoglycemic drugs: Secondary | ICD-10-CM | POA: Diagnosis not present

## 2017-10-30 DIAGNOSIS — R634 Abnormal weight loss: Secondary | ICD-10-CM | POA: Diagnosis not present

## 2017-10-30 NOTE — Progress Notes (Signed)
Hematology and Oncology Follow Up Visit  Frank Larsen [id number] 03/25/1946 72 y.o. 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  He has peritoneal metastasis after initial diagnosis with prostate adenocarcinoma 2013.    Prior Therapy:  He is status post robotic-assisted laparoscopic radical prostatectomy and extended lymphadenectomy with the pathology reveals T2N0.  completed in June 2013. Taxotere chemotherapy at 75 mg/m every 3 weeks started on 03/30/2016. He is status post 9 cycles of therapy.  He developed relapsed disease and a biopsy proven to show neuroendocrine poorly differentiated tumor in June 2018. He is S/P Carboplatin and etoposide. Cycle 1 given on 10/24/2016. He completed cycle 6 on February 13, 2017 with near complete response.  Current therapy: Carboplatin and paclitaxel cycle 1 given on 10/17/2017.  Interim History: Frank Larsen is here for a follow-up visit.  Since the last visit, he received the first cycle of chemotherapy with few complications.  He developed abdominal pain and diarrhea and was diagnosed with colitis and required hospitalization between 10/23/2017 and 10/26/2017.  He was treated with intravenous antibiotics with ciprofloxacin and Flagyl with significant improvement of his symptoms.  His diarrhea has subsided and it had excellent 3 days without any abdominal pain.  He has 1-2 bowel movements a day at most.  He has not reported any hematochezia or melena.  He denies any mucus production.  His energy and performance status is back to baseline although he is slightly fatigued.  He does not report any headaches, blurry vision, syncope or seizures.  He denied any confusion.  He denies any anxiety or depression.  He does not report any fevers, chills, sweats.  His appetite and weight has also improved.  He does not  report any chest pain, palpitation, orthopnea or leg edema. He does not report any cough, wheezing or hemoptysis. He does not report any hematochezia, melena. He  does not report any frequency, urgency or hematuria. He does not report any bone pain or pathological fractures.  He denies any petechia or easy bruising.  He denies any bleeding tendencies.  He denies any lymphadenopathy.  Remaining review of systems is negative.   Medications: I have reviewed the patient's current medications.  Current Outpatient Medications  Medication Sig Dispense Refill  . atorvastatin (LIPITOR) 10 MG tablet Take 10 mg by mouth daily with breakfast.     . Blood Glucose Monitoring Suppl (ONE TOUCH ULTRA 2) w/Device KIT Use to check blood sugar 2 times per day. 1 each 2  . calcium carbonate (TUMS - DOSED IN MG ELEMENTAL CALCIUM) 500 MG chewable tablet Chew 1 tablet (200 mg of elemental calcium total) by mouth 2 (two) times daily as needed for indigestion or heartburn. 30 tablet 0  . glipiZIDE (GLUCOTROL XL) 5 MG 24 hr tablet Take 1 tablet (5 mg total) by mouth daily with breakfast. 90 tablet 3  . glucose blood (ONE TOUCH ULTRA TEST) test strip Use to check blood sugar 2 times per day. 200 each 5  . HYDROcodone-acetaminophen (NORCO/VICODIN) 5-325 MG tablet Take 1-2 tablets by mouth every 4 (four) hours as needed. (Patient taking differently: Take 1-2 tablets by mouth every 4 (four) hours as needed for moderate pain or severe pain. ) 90 tablet 0  . hydrOXYzine (ATARAX/VISTARIL) 10 MG tablet TAKE 1 TABLET BY MOUTH EVERY 6 HOURS AS NEEDED (Patient taking differently: TAKE 1 TABLET BY MOUTH EVERY 6 HOURS  AS NEEDED FOR ITCHING) 60 tablet 0  . lidocaine-prilocaine (EMLA) cream Apply 1 application topically as needed. Apply to port before chemotherapy. 30 g 0  . loperamide (IMODIUM A-D) 2 MG tablet Take 2 mg by mouth 4 (four) times daily as needed for diarrhea or loose stools.    . magnesium chloride (SLOW-MAG) 64 MG TBEC SR  tablet Take 1 tablet (64 mg total) by mouth 2 (two) times daily. 60 tablet 0  . metFORMIN (GLUCOPHAGE) 1000 MG tablet Take 1 tablet (1,000 mg total) by mouth 2 (two) times daily with a meal. 180 tablet 3  . Multiple Vitamin (MULTIVITAMIN) tablet Take 1 tablet by mouth daily.    . naproxen sodium (ALEVE) 220 MG tablet Take 440 mg by mouth 2 (two) times daily as needed (pain).    [name] omeprazole (PRILOSEC) 20 MG capsule Take 20 mg by mouth every morning.    [name] DELICA LANCETS 30Q MISC Use to check blood sugar 2 times per day. 200 each 2  . prochlorperazine (COMPAZINE) 10 MG tablet Take 1 tablet (10 mg total) by mouth every 6 (six) hours as needed for nausea or vomiting. 30 tablet 0  . pseudoephedrine-acetaminophen (TYLENOL SINUS) 30-500 MG TABS tablet Take 1 tablet by mouth daily as needed (congestion).     . sitaGLIPtin (JANUVIA) 100 MG tablet Take 1 tablet (100 mg total) by mouth daily. 90 tablet 3   No current facility-administered medications for this visit.      Allergies:  Allergies  Allergen Reactions  . Scallops [Shellfish Allergy] Nausea And Vomiting  . Cephalexin Hives and Rash    Past Medical History, Surgical history, Social history, and Family History updated and remain unchanged.   Physical Exam: Blood pressure 126/62, pulse 85, temperature 99 F (37.2 C), temperature source Oral, resp. rate 18, height '5\' 10"'$  (1.778 m), weight 179 lb 6.4 oz (81.4 kg), SpO2 98 %.    ECOG: 1 General appearance: Well-appearing gentleman without distress. Head: Normocephalic without abnormalities. Oral mucosa: No oral thrush or ulcers. Lymph nodes: No cervical, supraclavicular, inguinal or axillary lymphadenopathy. Heart: Regular rate and rhythm with S1 and S2.  No leg edema. Lung: Clear all throughout lung fields without any rhonchi or, wheezes or dullness to percussion. Abdomin: Soft, nontender without any rebound or guarding.  Good bowel sounds.  No shifting dullness or  ascites. Musculoskeletal: No clubbing or cyanosis. Skin: Slightly dry without any ecchymosis or petechiae. Neurological: No motor or sensory deficits. Psychiatric: Mood and affect appeared appropriate.  Lab Results: Lab Results  Component Value Date   WBC 12.9 (H) 10/26/2017   HGB 9.2 (L) 10/26/2017   HCT 29.5 (L) 10/26/2017   MCV 88.6 10/26/2017   PLT 285 10/26/2017     Chemistry      Component Value Date/Time   NA 142 10/26/2017 0624   NA 140 122-Sep-202018 1136   K 3.4 (L) 10/26/2017 0624   K 3.9 122-Sep-202018 1136   CL 112 (H) 10/26/2017 0624   CO2 23 10/26/2017 0624   CO2 25 122-Sep-202018 1136   BUN 5 (L) 10/26/2017 0624   BUN 15.8 122-Sep-202018 1136   CREATININE 0.79 10/26/2017 0624   CREATININE 0.95 10/17/2017 0732   CREATININE 0.9 122-Sep-202018 1136   GLU 155 08/24/2015      Component Value Date/Time   CALCIUM 8.5 (L) 10/26/2017 0624   CALCIUM 9.2 122-Sep-202018 1136   ALKPHOS 60 10/23/2017 1752   ALKPHOS 55 122-Sep-202018 1136   AST 29 10/23/2017 1752  AST 19 10/17/2017 0732   AST 19 108/31/202018 1136   ALT 25 10/23/2017 1752   ALT 15 10/17/2017 0732   ALT 15 108/31/202018 1136   BILITOT 0.8 10/23/2017 1752   BILITOT 0.3 10/17/2017 0732   BILITOT 0.32 108/31/202018 1136      EXAM: CT ABDOMEN AND PELVIS WITH CONTRAST  TECHNIQUE: Multidetector CT imaging of the abdomen and pelvis was performed using the standard protocol following bolus administration of intravenous contrast.  CONTRAST:  145m ISOVUE-300 IOPAMIDOL (ISOVUE-300) INJECTION 61%  COMPARISON:  10/09/2017 CT abdomen and pelvis.  FINDINGS: Lower chest: Small right and trace left pleural effusions. Moderate coronary artery calcific atherosclerosis.  Hepatobiliary: Subcentimeter segment 2 fluid attenuating structure compatible with cyst is stable. No other liver lesion identified. Normal gallbladder.  Pancreas: Unremarkable. No pancreatic ductal dilatation or surrounding inflammatory changes.  Spleen:  Normal in size without focal abnormality.  Adrenals/Urinary Tract: Adrenal glands are unremarkable. Kidneys are normal, without renal calculi, focal lesion, or hydronephrosis. Mild wall thickening of the bladder.  Stomach/Bowel: Mild diffuse dilatation of the small bowel with bowel wall thickening and mucosal enhancement. Small bowel distention is overall mildly increased when compared with the prior CT of abdomen and pelvis. Additionally there is new mild air-filled distention of the colon with mild wall thickening of sigmoid colon and rectum. No evidence for perforation or abscess.  Vascular/Lymphatic: Stable small volume of peritoneal ascites peritoneal nodularity. Abdominal aortic calcific atherosclerosis.  Reproductive: Prostatectomy.  Other: No abdominal wall hernia or abnormality. No abdominopelvic ascites.  Musculoskeletal: No fracture is seen.  IMPRESSION: 1. Persistent diffuse nonspecific enteritis and new inflammation of sigmoid colon and rectum. Mild interval distention of small bowel and colon without focal obstruction probably representing ileus. 2. Stable peritoneal carcinomatosis and ascites. 3. Stable small right and trace left pleural effusions.    Impression and Plan:   72year old man with:  1.  Poorly differentiated advanced prostate cancer with neuroendocrine features presenting with peritoneal metastasis.  He is status post multiple therapies outlined above.  Based on imaging studies and October 09, 2017 showed progression of disease and currently receiving salvage chemotherapy with carboplatin and paclitaxel and the first cycle was given on 10/17/2017.  He developed worsening colitis that required hospitalization after that.  It appears that he had colitis and leading up to his chemotherapy treatment that was exacerbated by therapy.  The plan is to proceed with cycle 2 without any dose reduction or delay.  Consideration for Taxol dose reduction will  be considered in the future if the symptoms recur.  2.  Abdominal pain and colitis: Resolved at this time.  His C. difficile was negative and his symptoms has improved.   3. IV access: Port-A-Cath remains in use without any complaints or complications.  4.  Pruritus: Appears to be very limited and manageable at this time.   5. Anemia: His hemoglobin appeared to be stable on 01/26/2018 without transfusion.  We will consider packed red cell transfusion if his hemoglobin below 8.  6.  Neutropenia prophylaxis: He will receive growth factor support after each cycle of chemotherapy.  7.  Prognosis: he continues to have incurable disease but disease and has been palliated for an extended period of time.  His performance status remains adequate and aggressive therapy is warranted.  8. Follow-up: Will be on November 07, 2017 for cycle 2 of chemotherapy.  25  minutes was spent with the patient face-to-face today.  More than 50% of time was dedicated to patient counseling,  education and pronating his care in addition to discussing future plan of care.  Zola Button, MD 6/24/20198:15 AM

## 2017-10-30 NOTE — Telephone Encounter (Signed)
Printed avs and calender of upcoming appointment. Per 6/24 los 

## 2017-11-06 DIAGNOSIS — E1165 Type 2 diabetes mellitus with hyperglycemia: Secondary | ICD-10-CM | POA: Diagnosis not present

## 2017-11-06 DIAGNOSIS — I10 Essential (primary) hypertension: Secondary | ICD-10-CM | POA: Diagnosis not present

## 2017-11-06 DIAGNOSIS — K529 Noninfective gastroenteritis and colitis, unspecified: Secondary | ICD-10-CM | POA: Diagnosis not present

## 2017-11-06 DIAGNOSIS — C786 Secondary malignant neoplasm of retroperitoneum and peritoneum: Secondary | ICD-10-CM | POA: Diagnosis not present

## 2017-11-06 DIAGNOSIS — K219 Gastro-esophageal reflux disease without esophagitis: Secondary | ICD-10-CM | POA: Diagnosis not present

## 2017-11-06 DIAGNOSIS — Z7984 Long term (current) use of oral hypoglycemic drugs: Secondary | ICD-10-CM | POA: Diagnosis not present

## 2017-11-06 DIAGNOSIS — E782 Mixed hyperlipidemia: Secondary | ICD-10-CM | POA: Diagnosis not present

## 2017-11-06 DIAGNOSIS — D649 Anemia, unspecified: Secondary | ICD-10-CM | POA: Diagnosis not present

## 2017-11-06 DIAGNOSIS — C61 Malignant neoplasm of prostate: Secondary | ICD-10-CM | POA: Diagnosis not present

## 2017-11-06 DIAGNOSIS — R5383 Other fatigue: Secondary | ICD-10-CM | POA: Diagnosis not present

## 2017-11-07 ENCOUNTER — Inpatient Hospital Stay: Payer: PPO

## 2017-11-07 ENCOUNTER — Inpatient Hospital Stay: Payer: PPO | Attending: Oncology

## 2017-11-07 VITALS — BP 136/82 | HR 80 | Temp 98.5°F | Resp 17 | Ht 70.0 in | Wt 175.8 lb

## 2017-11-07 DIAGNOSIS — L299 Pruritus, unspecified: Secondary | ICD-10-CM | POA: Diagnosis not present

## 2017-11-07 DIAGNOSIS — Z79899 Other long term (current) drug therapy: Secondary | ICD-10-CM | POA: Diagnosis not present

## 2017-11-07 DIAGNOSIS — R530 Neoplastic (malignant) related fatigue: Secondary | ICD-10-CM | POA: Insufficient documentation

## 2017-11-07 DIAGNOSIS — C786 Secondary malignant neoplasm of retroperitoneum and peritoneum: Secondary | ICD-10-CM

## 2017-11-07 DIAGNOSIS — E119 Type 2 diabetes mellitus without complications: Secondary | ICD-10-CM | POA: Insufficient documentation

## 2017-11-07 DIAGNOSIS — Z7984 Long term (current) use of oral hypoglycemic drugs: Secondary | ICD-10-CM | POA: Diagnosis not present

## 2017-11-07 DIAGNOSIS — C61 Malignant neoplasm of prostate: Secondary | ICD-10-CM

## 2017-11-07 DIAGNOSIS — D649 Anemia, unspecified: Secondary | ICD-10-CM | POA: Insufficient documentation

## 2017-11-07 DIAGNOSIS — Z5189 Encounter for other specified aftercare: Secondary | ICD-10-CM | POA: Insufficient documentation

## 2017-11-07 DIAGNOSIS — G620 Drug-induced polyneuropathy: Secondary | ICD-10-CM | POA: Diagnosis not present

## 2017-11-07 DIAGNOSIS — Z95828 Presence of other vascular implants and grafts: Secondary | ICD-10-CM

## 2017-11-07 DIAGNOSIS — Z5111 Encounter for antineoplastic chemotherapy: Secondary | ICD-10-CM | POA: Insufficient documentation

## 2017-11-07 LAB — CBC WITH DIFFERENTIAL (CANCER CENTER ONLY)
Basophils Absolute: 0.1 10*3/uL (ref 0.0–0.1)
Basophils Relative: 1 %
EOS ABS: 0.1 10*3/uL (ref 0.0–0.5)
EOS PCT: 1 %
HCT: 29.5 % — ABNORMAL LOW (ref 38.4–49.9)
Hemoglobin: 9.5 g/dL — ABNORMAL LOW (ref 13.0–17.1)
LYMPHS ABS: 0.7 10*3/uL — AB (ref 0.9–3.3)
LYMPHS PCT: 9 %
MCH: 27.8 pg (ref 27.2–33.4)
MCHC: 32.3 g/dL (ref 32.0–36.0)
MCV: 85.9 fL (ref 79.3–98.0)
MONO ABS: 0.9 10*3/uL (ref 0.1–0.9)
MONOS PCT: 12 %
Neutro Abs: 5.8 10*3/uL (ref 1.5–6.5)
Neutrophils Relative %: 77 %
PLATELETS: 318 10*3/uL (ref 140–400)
RBC: 3.44 MIL/uL — ABNORMAL LOW (ref 4.20–5.82)
RDW: 15.9 % — ABNORMAL HIGH (ref 11.0–14.6)
WBC Count: 7.5 10*3/uL (ref 4.0–10.3)

## 2017-11-07 LAB — CMP (CANCER CENTER ONLY)
ALBUMIN: 2.9 g/dL — AB (ref 3.5–5.0)
ALT: 37 U/L (ref 0–44)
AST: 25 U/L (ref 15–41)
Alkaline Phosphatase: 88 U/L (ref 38–126)
Anion gap: 9 (ref 5–15)
BUN: 9 mg/dL (ref 8–23)
CHLORIDE: 103 mmol/L (ref 98–111)
CO2: 27 mmol/L (ref 22–32)
CREATININE: 0.8 mg/dL (ref 0.61–1.24)
Calcium: 8.9 mg/dL (ref 8.9–10.3)
GFR, Est AFR Am: >60 mL/min (ref >60)
GLUCOSE: 213 mg/dL — AB (ref 70–99)
Potassium: 3.9 mmol/L (ref 3.5–5.1)
SODIUM: 139 mmol/L (ref 135–145)
Total Bilirubin: 0.3 mg/dL (ref 0.3–1.2)
Total Protein: 6.5 g/dL (ref 6.5–8.1)

## 2017-11-07 MED ORDER — DIPHENHYDRAMINE HCL 50 MG/ML IJ SOLN
50.0000 mg | Freq: Once | INTRAMUSCULAR | Status: AC
  Administered 2017-11-07: 50 mg via INTRAVENOUS

## 2017-11-07 MED ORDER — SODIUM CHLORIDE 0.9 % IV SOLN
175.0000 mg/m2 | Freq: Once | INTRAVENOUS | Status: AC
  Administered 2017-11-07: 354 mg via INTRAVENOUS
  Filled 2017-11-07: qty 59

## 2017-11-07 MED ORDER — FOSAPREPITANT DIMEGLUMINE INJECTION 150 MG
Freq: Once | INTRAVENOUS | Status: AC
  Administered 2017-11-07: 10:00:00 via INTRAVENOUS
  Filled 2017-11-07: qty 5

## 2017-11-07 MED ORDER — SODIUM CHLORIDE 0.9 % IV SOLN
Freq: Once | INTRAVENOUS | Status: AC
  Administered 2017-11-07: 09:00:00 via INTRAVENOUS

## 2017-11-07 MED ORDER — SODIUM CHLORIDE 0.9 % IV SOLN
514.5000 mg | Freq: Once | INTRAVENOUS | Status: AC
  Administered 2017-11-07: 510 mg via INTRAVENOUS
  Filled 2017-11-07: qty 51

## 2017-11-07 MED ORDER — SODIUM CHLORIDE 0.9% FLUSH
10.0000 mL | INTRAVENOUS | Status: DC | PRN
  Administered 2017-11-07: 10 mL
  Filled 2017-11-07: qty 10

## 2017-11-07 MED ORDER — FAMOTIDINE IN NACL 20-0.9 MG/50ML-% IV SOLN
20.0000 mg | Freq: Once | INTRAVENOUS | Status: AC
  Administered 2017-11-07: 20 mg via INTRAVENOUS

## 2017-11-07 MED ORDER — PALONOSETRON HCL INJECTION 0.25 MG/5ML
0.2500 mg | Freq: Once | INTRAVENOUS | Status: AC
Start: 2017-11-07 — End: 2017-11-07
  Administered 2017-11-07: 0.25 mg via INTRAVENOUS

## 2017-11-07 MED ORDER — SODIUM CHLORIDE 0.9% FLUSH
10.0000 mL | INTRAVENOUS | Status: DC | PRN
  Administered 2017-11-07: 10 mL via INTRAVENOUS
  Filled 2017-11-07: qty 10

## 2017-11-07 MED ORDER — PALONOSETRON HCL INJECTION 0.25 MG/5ML
INTRAVENOUS | Status: AC
  Filled 2017-11-07: qty 5

## 2017-11-07 MED ORDER — HEPARIN SOD (PORK) LOCK FLUSH 100 UNIT/ML IV SOLN
500.0000 [IU] | Freq: Once | INTRAVENOUS | Status: AC | PRN
  Administered 2017-11-07: 500 [IU]
  Filled 2017-11-07: qty 5

## 2017-11-07 MED ORDER — FAMOTIDINE IN NACL 20-0.9 MG/50ML-% IV SOLN
INTRAVENOUS | Status: AC
  Filled 2017-11-07: qty 50

## 2017-11-07 MED ORDER — DIPHENHYDRAMINE HCL 50 MG/ML IJ SOLN
INTRAMUSCULAR | Status: AC
  Filled 2017-11-07: qty 1

## 2017-11-08 ENCOUNTER — Encounter: Payer: Self-pay | Admitting: Internal Medicine

## 2017-11-08 ENCOUNTER — Inpatient Hospital Stay: Payer: PPO

## 2017-11-08 DIAGNOSIS — C61 Malignant neoplasm of prostate: Secondary | ICD-10-CM

## 2017-11-08 DIAGNOSIS — Z5189 Encounter for other specified aftercare: Secondary | ICD-10-CM | POA: Diagnosis not present

## 2017-11-08 LAB — PROSTATE-SPECIFIC AG, SERUM (LABCORP): PROSTATE SPECIFIC AG, SERUM: 0.3 ng/mL (ref 0.0–4.0)

## 2017-11-08 MED ORDER — PEGFILGRASTIM-CBQV 6 MG/0.6ML ~~LOC~~ SOSY
6.0000 mg | PREFILLED_SYRINGE | Freq: Once | SUBCUTANEOUS | Status: AC
  Administered 2017-11-08: 6 mg via SUBCUTANEOUS

## 2017-11-08 MED ORDER — PEGFILGRASTIM-CBQV 6 MG/0.6ML ~~LOC~~ SOSY
PREFILLED_SYRINGE | SUBCUTANEOUS | Status: AC
  Filled 2017-11-08: qty 0.6

## 2017-11-09 ENCOUNTER — Encounter: Payer: Self-pay | Admitting: Internal Medicine

## 2017-11-10 ENCOUNTER — Encounter: Payer: Self-pay | Admitting: Oncology

## 2017-11-10 ENCOUNTER — Encounter: Payer: Self-pay | Admitting: Internal Medicine

## 2017-11-14 ENCOUNTER — Telehealth: Payer: Self-pay

## 2017-11-14 NOTE — Telephone Encounter (Signed)
Pt sent message via MyChart as follows:   Dr. Alen Blew,    I wanted to let you know that the recent chemo treatment on July 2 seems to have gone by without incident. The Nulasta injection on 7/3 is another matter. It left me very tired and achey as it has sometimes in the past. The good news is there appears to be no return at this time to the severe abdominal issues that caused me to go to emergency twice in June and no recurrence of the severe diarrhea.    Frank Larsen 1945/12/21   Per Dr. Alen Blew, will review symptoms with patient at next office visit. Response sent to patient notifying him of this.

## 2017-11-28 ENCOUNTER — Inpatient Hospital Stay: Payer: PPO

## 2017-11-28 ENCOUNTER — Inpatient Hospital Stay (HOSPITAL_BASED_OUTPATIENT_CLINIC_OR_DEPARTMENT_OTHER): Payer: PPO | Admitting: Oncology

## 2017-11-28 ENCOUNTER — Telehealth: Payer: Self-pay | Admitting: Oncology

## 2017-11-28 VITALS — BP 134/67 | HR 82 | Temp 98.2°F | Resp 18 | Ht 70.0 in | Wt 174.1 lb

## 2017-11-28 DIAGNOSIS — E119 Type 2 diabetes mellitus without complications: Secondary | ICD-10-CM | POA: Diagnosis not present

## 2017-11-28 DIAGNOSIS — Z7984 Long term (current) use of oral hypoglycemic drugs: Secondary | ICD-10-CM

## 2017-11-28 DIAGNOSIS — Z79899 Other long term (current) drug therapy: Secondary | ICD-10-CM | POA: Diagnosis not present

## 2017-11-28 DIAGNOSIS — R530 Neoplastic (malignant) related fatigue: Secondary | ICD-10-CM | POA: Diagnosis not present

## 2017-11-28 DIAGNOSIS — Z95828 Presence of other vascular implants and grafts: Secondary | ICD-10-CM

## 2017-11-28 DIAGNOSIS — D649 Anemia, unspecified: Secondary | ICD-10-CM

## 2017-11-28 DIAGNOSIS — C786 Secondary malignant neoplasm of retroperitoneum and peritoneum: Secondary | ICD-10-CM

## 2017-11-28 DIAGNOSIS — C61 Malignant neoplasm of prostate: Secondary | ICD-10-CM

## 2017-11-28 DIAGNOSIS — L299 Pruritus, unspecified: Secondary | ICD-10-CM | POA: Diagnosis not present

## 2017-11-28 DIAGNOSIS — Z5189 Encounter for other specified aftercare: Secondary | ICD-10-CM | POA: Diagnosis not present

## 2017-11-28 DIAGNOSIS — G620 Drug-induced polyneuropathy: Secondary | ICD-10-CM

## 2017-11-28 LAB — CMP (CANCER CENTER ONLY)
ALK PHOS: 77 U/L (ref 38–126)
ALT: 23 U/L (ref 0–44)
AST: 17 U/L (ref 15–41)
Albumin: 3.4 g/dL — ABNORMAL LOW (ref 3.5–5.0)
Anion gap: 10 (ref 5–15)
BILIRUBIN TOTAL: 0.2 mg/dL — AB (ref 0.3–1.2)
BUN: 12 mg/dL (ref 8–23)
CALCIUM: 8.8 mg/dL — AB (ref 8.9–10.3)
CO2: 25 mmol/L (ref 22–32)
Chloride: 106 mmol/L (ref 98–111)
Creatinine: 0.97 mg/dL (ref 0.61–1.24)
Glucose, Bld: 221 mg/dL — ABNORMAL HIGH (ref 70–99)
Potassium: 3.8 mmol/L (ref 3.5–5.1)
Sodium: 141 mmol/L (ref 135–145)
TOTAL PROTEIN: 7 g/dL (ref 6.5–8.1)

## 2017-11-28 LAB — CBC WITH DIFFERENTIAL (CANCER CENTER ONLY)
BASOS ABS: 0.1 10*3/uL (ref 0.0–0.1)
BASOS PCT: 1 %
EOS ABS: 0.1 10*3/uL (ref 0.0–0.5)
EOS PCT: 2 %
HCT: 31.8 % — ABNORMAL LOW (ref 38.4–49.9)
Hemoglobin: 10 g/dL — ABNORMAL LOW (ref 13.0–17.1)
LYMPHS PCT: 10 %
Lymphs Abs: 0.9 10*3/uL (ref 0.9–3.3)
MCH: 28.1 pg (ref 27.2–33.4)
MCHC: 31.4 g/dL — ABNORMAL LOW (ref 32.0–36.0)
MCV: 89.3 fL (ref 79.3–98.0)
Monocytes Absolute: 0.9 10*3/uL (ref 0.1–0.9)
Monocytes Relative: 11 %
Neutro Abs: 6.6 10*3/uL — ABNORMAL HIGH (ref 1.5–6.5)
Neutrophils Relative %: 76 %
Platelet Count: 287 10*3/uL (ref 140–400)
RBC: 3.56 MIL/uL — AB (ref 4.20–5.82)
RDW: 17.5 % — ABNORMAL HIGH (ref 11.0–14.6)
WBC: 8.6 10*3/uL (ref 4.0–10.3)

## 2017-11-28 MED ORDER — FAMOTIDINE IN NACL 20-0.9 MG/50ML-% IV SOLN
20.0000 mg | Freq: Once | INTRAVENOUS | Status: AC
  Administered 2017-11-28: 20 mg via INTRAVENOUS

## 2017-11-28 MED ORDER — DIPHENHYDRAMINE HCL 50 MG/ML IJ SOLN
INTRAMUSCULAR | Status: AC
  Filled 2017-11-28: qty 1

## 2017-11-28 MED ORDER — FAMOTIDINE IN NACL 20-0.9 MG/50ML-% IV SOLN
INTRAVENOUS | Status: AC
  Filled 2017-11-28: qty 50

## 2017-11-28 MED ORDER — SODIUM CHLORIDE 0.9 % IV SOLN
Freq: Once | INTRAVENOUS | Status: AC
  Administered 2017-11-28: 09:00:00 via INTRAVENOUS

## 2017-11-28 MED ORDER — PALONOSETRON HCL INJECTION 0.25 MG/5ML
0.2500 mg | Freq: Once | INTRAVENOUS | Status: AC
  Administered 2017-11-28: 0.25 mg via INTRAVENOUS

## 2017-11-28 MED ORDER — DIPHENHYDRAMINE HCL 50 MG/ML IJ SOLN
50.0000 mg | Freq: Once | INTRAMUSCULAR | Status: AC
  Administered 2017-11-28: 50 mg via INTRAVENOUS

## 2017-11-28 MED ORDER — HEPARIN SOD (PORK) LOCK FLUSH 100 UNIT/ML IV SOLN
500.0000 [IU] | Freq: Once | INTRAVENOUS | Status: AC | PRN
  Administered 2017-11-28: 500 [IU]
  Filled 2017-11-28: qty 5

## 2017-11-28 MED ORDER — SODIUM CHLORIDE 0.9% FLUSH
10.0000 mL | INTRAVENOUS | Status: DC | PRN
  Administered 2017-11-28: 10 mL
  Filled 2017-11-28: qty 10

## 2017-11-28 MED ORDER — SODIUM CHLORIDE 0.9% FLUSH
10.0000 mL | INTRAVENOUS | Status: DC | PRN
  Administered 2017-11-28: 10 mL via INTRAVENOUS
  Filled 2017-11-28: qty 10

## 2017-11-28 MED ORDER — PALONOSETRON HCL INJECTION 0.25 MG/5ML
INTRAVENOUS | Status: AC
  Filled 2017-11-28: qty 5

## 2017-11-28 MED ORDER — SODIUM CHLORIDE 0.9 % IV SOLN
175.0000 mg/m2 | Freq: Once | INTRAVENOUS | Status: AC
  Administered 2017-11-28: 354 mg via INTRAVENOUS
  Filled 2017-11-28: qty 59

## 2017-11-28 MED ORDER — SODIUM CHLORIDE 0.9 % IV SOLN
510.0000 mg | Freq: Once | INTRAVENOUS | Status: AC
  Administered 2017-11-28: 510 mg via INTRAVENOUS
  Filled 2017-11-28: qty 51

## 2017-11-28 MED ORDER — FOSAPREPITANT DIMEGLUMINE INJECTION 150 MG
Freq: Once | INTRAVENOUS | Status: AC
  Administered 2017-11-28: 10:00:00 via INTRAVENOUS
  Filled 2017-11-28: qty 5

## 2017-11-28 NOTE — Telephone Encounter (Signed)
Gave patient avs and calendar of upcoming sept appts.

## 2017-11-28 NOTE — Patient Instructions (Signed)
   [facility] Discharge Instructions for Patients Receiving Chemotherapy  Today you received the following chemotherapy agents Taxol and Carboplatin   To help prevent nausea and vomiting after your treatment, we encourage you to take your nausea medication as directed.    If you develop nausea and vomiting that is not controlled by your nausea medication, call the clinic.   BELOW ARE SYMPTOMS THAT SHOULD BE REPORTED IMMEDIATELY:  *FEVER GREATER THAN 100.5 F  *CHILLS WITH OR WITHOUT FEVER  NAUSEA AND VOMITING THAT IS NOT CONTROLLED WITH YOUR NAUSEA MEDICATION  *UNUSUAL SHORTNESS OF BREATH  *UNUSUAL BRUISING OR BLEEDING  TENDERNESS IN MOUTH AND THROAT WITH OR WITHOUT PRESENCE OF ULCERS  *URINARY PROBLEMS  *BOWEL PROBLEMS  UNUSUAL RASH Items with * indicate a potential emergency and should be followed up as soon as possible.  Feel free to call the clinic should you have any questions or concerns. The clinic phone number is [phone].  Please show the CHEMO ALERT CARD at check-in to the Emergency Department and triage nurse.   

## 2017-11-28 NOTE — Progress Notes (Signed)
Nutrition Assessment:  Patient with recurrent prostate cancer.  Patient receiving palliative chemotherapy. Past medical history of radical prostatectomy 10/2011, DM.   Noted hospital admission with diarrhea from 6/17-6/20.  Met with patient during infusion today with wife present.  Patient reports that taste of food is off (ie loves pizza but taste if off now, french fries don't taste right). Patient reports vegetables, fruits taste better to him.  If get a hamburger out now would ask for salad not fries.  Reports that he is eating about 1/2 of what would normally eat.     Medications: glipizide, imodium januvia, MVI, compazine, prilosec  Labs: glucose 221  Anthropometrics:   Height: 70 inches Weight: 174 lb 1.6 oz UBW: 195-200 over 1 1/2 years ago Noted at 195 lb on 07/27/17 per chart BMI: 24   11% weight loss in the last 4 months, significant  Estimated Energy Needs  Kcals: 2200-2370 calories/d Protein: 110-118 g/d Fluid: 2.3 L/d  NUTRITION DIAGNOSIS: Malnutrition related to cancer and cancer related side effects as evidenced by 11% weight loss in the last 4 months and eating < 75% energy needs for > 1 month   MALNUTRITION DIAGNOSIS: Patient meets criteria for severe malnutrition in context of chronic illness as evidenced by 11% weight loss in 4 months and eating < 75% energy intake for > 1 month   INTERVENTION:  Discussed strategies to increase calories and protein.  Handout provided.   Encouraged small frequent meals Encouraged liberalizing diet at this time secondary to severe malnutrition (weight loss, taste change lack of appetite). Would recommend patient to continue to monitor blood glucose and have medication adjusted to control blood glucose vs restricting diet at this time.  Encouraged higher calorie oral nutrition supplements shakes (350 + calorie vs 190 calorie) Contact information provided    MONITORING, EVALUATION, GOAL: Patient will consume adequate calories and  protein to maintain weight   NEXT VISIT: August 13 during infusion  Iya Hamed [name], [place], [place] Registered Dietitian [phone] (pager)

## 2017-11-28 NOTE — Progress Notes (Signed)
Hematology and Oncology Follow Up Visit  Frank Larsen [id number] 20-Apr-1946 72 y.o. 11/28/2017 8:45 AM Frank Larsen, Frank Brow, MD   Principle Diagnosis: 72 year old man with poorly differentiated prostate cancer with neuroendocrine features with the peritoneal involvement diagnosed in 2017.  He was initially diagnosed with well-differentiated adenocarcinoma the prostate in 2013.   Prior Therapy:  He is status post robotic-assisted laparoscopic radical prostatectomy and extended lymphadenectomy with the pathology reveals T2N0.  completed in June 2013. Taxotere chemotherapy at 75 mg/m every 3 weeks started on 03/30/2016. He is status post 9 cycles of therapy.  He developed relapsed disease and a biopsy proven to show neuroendocrine poorly differentiated tumor in June 2018. He is S/P Carboplatin and etoposide. Cycle 1 given on 10/24/2016. He completed cycle 6 on February 13, 2017 with near complete response.  Current therapy: Carboplatin and paclitaxel cycle 1 given on 10/17/2017.  He completed 2 cycles of therapy.  Interim History: [name] resents today for a follow-up.  Since the last visit, he completed 2 cycles of chemotherapy utilizing carboplatin and paclitaxel.  Tolerated chemotherapy without any major complications.  He denies any nausea, vomiting or excessive fatigue or tiredness.  He does report some mild fatigue and grade 1 sensory neuropathy.  He did have worsening arthralgias and myalgias related to growth factor support and the symptoms has resolved.  His appetite remained reasonable and his weight has not changed dramatically.  He denies any recent hospitalization or illnesses.  He denies any worsening diarrhea or abdominal distention.  He does not report any headaches, blurry vision, syncope or seizures.  He denied any alteration in mental status or dizziness.  He denies any mood changes.  He does not report any fevers, chills, sweats.  He does not report any chest pain,  palpitation, orthopnea or leg edema. He does not report any cough, wheezing or hemoptysis. He does not report any change in his bowel habits. He  does not report any frequency, urgency or hematuria. He does not report any paralysis or myalgias.  He denies any petechia or easy bruising.  He denies any clotting tendencies.  He denies any lymphadenopathy or petechiae.  Denies any heat or cold intolerance.  Remaining review of systems is negative.   Medications: I have reviewed the patient's current medications.  Current Outpatient Medications  Medication Sig Dispense Refill  . atorvastatin (LIPITOR) 10 MG tablet Take 10 mg by mouth daily with breakfast.     . Blood Glucose Monitoring Suppl (ONE TOUCH ULTRA 2) w/Device KIT Use to check blood sugar 2 times per day. 1 each 2  . calcium carbonate (TUMS - DOSED IN MG ELEMENTAL CALCIUM) 500 MG chewable tablet Chew 1 tablet (200 mg of elemental calcium total) by mouth 2 (two) times daily as needed for indigestion or heartburn. 30 tablet 0  . glipiZIDE (GLUCOTROL XL) 5 MG 24 hr tablet Take 1 tablet (5 mg total) by mouth daily with breakfast. 90 tablet 3  . glucose blood (ONE TOUCH ULTRA TEST) test strip Use to check blood sugar 2 times per day. 200 each 5  . HYDROcodone-acetaminophen (NORCO/VICODIN) 5-325 MG tablet Take 1-2 tablets by mouth every 4 (four) hours as needed. (Patient taking differently: Take 1-2 tablets by mouth every 4 (four) hours as needed for moderate pain or severe pain. ) 90 tablet 0  . hydrOXYzine (ATARAX/VISTARIL) 10 MG tablet TAKE 1 TABLET BY MOUTH EVERY 6 HOURS AS NEEDED (Patient taking differently: TAKE 1 TABLET BY MOUTH EVERY 6 HOURS  AS NEEDED FOR ITCHING) 60 tablet 0  . lidocaine-prilocaine (EMLA) cream Apply 1 application topically as needed. Apply to port before chemotherapy. 30 g 0  . loperamide (IMODIUM A-D) 2 MG tablet Take 2 mg by mouth 4 (four) times daily as needed for diarrhea or loose stools.    . magnesium chloride  (SLOW-MAG) 64 MG TBEC SR tablet Take 1 tablet (64 mg total) by mouth 2 (two) times daily. 60 tablet 0  . metFORMIN (GLUCOPHAGE) 1000 MG tablet Take 1 tablet (1,000 mg total) by mouth 2 (two) times daily with a meal. 180 tablet 3  . Multiple Vitamin (MULTIVITAMIN) tablet Take 1 tablet by mouth daily.    . naproxen sodium (ALEVE) 220 MG tablet Take 440 mg by mouth 2 (two) times daily as needed (pain).    [name] omeprazole (PRILOSEC) 20 MG capsule Take 20 mg by mouth every morning.    [name] DELICA LANCETS 74B MISC Use to check blood sugar 2 times per day. 200 each 2  . prochlorperazine (COMPAZINE) 10 MG tablet Take 1 tablet (10 mg total) by mouth every 6 (six) hours as needed for nausea or vomiting. 30 tablet 0  . pseudoephedrine-acetaminophen (TYLENOL SINUS) 30-500 MG TABS tablet Take 1 tablet by mouth daily as needed (congestion).     . sitaGLIPtin (JANUVIA) 100 MG tablet Take 1 tablet (100 mg total) by mouth daily. 90 tablet 3   No current facility-administered medications for this visit.    Facility-Administered Medications Ordered in Other Visits  Medication Dose Route Frequency Provider Last Rate Last Dose  . sodium chloride flush (NS) 0.9 % injection 10 mL  10 mL Intravenous PRN Wyatt Portela, MD   10 mL at 11/28/17 0835     Allergies:  Allergies  Allergen Reactions  . Scallops [Shellfish Allergy] Nausea And Vomiting  . Cephalexin Hives and Rash    Past Medical History, Surgical history, Social history, and Family History updated and remain unchanged.   Physical Exam: Blood pressure 134/67, pulse 82, temperature 98.2 F (36.8 C), temperature source Oral, resp. rate 18, height _0  (1.778 m), weight 174 lb 1.6 oz (79 kg), SpO2 97 %.    ECOG: 1 General appearance: Alert, awake gentleman without distress. Head: Atraumatic without abnormalities.. Oral mucosa: His membranes are moist and pink.   Lymph nodes: No lymphadenopathy noted in the cervical, supraclavicular, inguinal  or axillary nodes. Heart: Regular rate and rhythm without murmurs or gallops.  No leg edema. Lung: Clear to auscultation without any rhonchi or wheezes or dullness to percussion. Abdomin: Soft, nontender,, nondistended with good bowel sounds.  No hepatosplenomegaly.  No shifting dullness or ascites. Musculoskeletal: No joint deformity or effusion. Skin: Dry without any rashes or lesions. Neurological: Tactile motor, sensory and deep tendon reflexes.  Psychiatric: His mood appeared appropriate today.  Lab Results: Lab Results  Component Value Date   WBC 7.5 11/07/2017   HGB 9.5 (L) 11/07/2017   HCT 29.5 (L) 11/07/2017   MCV 85.9 11/07/2017   PLT 318 11/07/2017     Chemistry      Component Value Date/Time   NA 139 11/07/2017 0757   NA 140 130-Nov-202018 1136   K 3.9 11/07/2017 0757   K 3.9 130-Nov-202018 1136   CL 103 11/07/2017 0757   CO2 27 11/07/2017 0757   CO2 25 130-Nov-202018 1136   BUN 9 11/07/2017 0757   BUN 15.8 130-Nov-202018 1136   CREATININE 0.80 11/07/2017 0757   CREATININE 0.9 130-Nov-202018 1136  GLU 155 08/24/2015      Component Value Date/Time   CALCIUM 8.9 11/07/2017 0757   CALCIUM 9.2 114-Nov-202018 1136   ALKPHOS 88 11/07/2017 0757   ALKPHOS 55 114-Nov-202018 1136   AST 25 11/07/2017 0757   AST 19 114-Nov-202018 1136   ALT 37 11/07/2017 0757   ALT 15 114-Nov-202018 1136   BILITOT 0.3 11/07/2017 0757   BILITOT 0.32 114-Nov-202018 1136      IMPRESSION: 1. Persistent diffuse nonspecific enteritis and new inflammation of sigmoid colon and rectum. Mild interval distention of small bowel and colon without focal obstruction probably representing ileus. 2. Stable peritoneal carcinomatosis and ascites. 3. Stable small right and trace left pleural effusions.    Impression and Plan:   72 year old man with:  1.  Metastatic poorly differentiated prostate cancer with neuroendocrine features, and the peritoneum.    He is currently receiving carboplatin and paclitaxel and received 2 cycles  of therapy.  Risks and benefits of continuing this therapy at this time was reviewed.  Is agreeable to continuing the plan is to complete at least 4 cycles before repeat imaging studies.  2.  Abdominal pain and colitis: No recent exacerbations noted.  The symptoms have resolved   3. IV access: Port-A-Cath no issues related to Port-A-Cath usage at this time.  4.  Pruritus: Very minimally changed at this time.  He takes hydroxyzine with improvement in his symptoms.   5. Anemia: Hemoglobin continues to improve at this time and does not require any transfusion support.  6.  Neutropenia prophylaxis: He will receive growth factor support after each cycle of chemotherapy.  Risks and benefit of discontinuing this therapy moving forward was discussed.  He would like to hold growth factor support after the next cycle of chemotherapy on December 19, 2017.  He will receive growth factor support after his current cycle of therapy.  7.  Prognosis: Treatment is palliative and goal but his performance status remain excellent.  8. Follow-up: Will be in 3 weeks for the next cycle of chemotherapy.  25  minutes was spent with the patient face-to-face today.  More than 50% of time was dedicated to patient counseling, education and discussing the natural course of this disease as well as future treatment options.  Zola Button, MD 7/23/20198:45 AM

## 2017-11-29 ENCOUNTER — Inpatient Hospital Stay: Payer: PPO

## 2017-11-29 ENCOUNTER — Encounter: Payer: Self-pay | Admitting: Oncology

## 2017-11-29 VITALS — BP 150/69 | HR 74 | Temp 97.6°F | Resp 18

## 2017-11-29 DIAGNOSIS — C61 Malignant neoplasm of prostate: Secondary | ICD-10-CM

## 2017-11-29 DIAGNOSIS — Z5189 Encounter for other specified aftercare: Secondary | ICD-10-CM | POA: Diagnosis not present

## 2017-11-29 MED ORDER — PEGFILGRASTIM-CBQV 6 MG/0.6ML ~~LOC~~ SOSY
PREFILLED_SYRINGE | SUBCUTANEOUS | Status: AC
  Filled 2017-11-29: qty 0.6

## 2017-11-29 MED ORDER — PEGFILGRASTIM-CBQV 6 MG/0.6ML ~~LOC~~ SOSY
6.0000 mg | PREFILLED_SYRINGE | Freq: Once | SUBCUTANEOUS | Status: AC
Start: 2017-11-29 — End: 2017-11-29
  Administered 2017-11-29: 6 mg via SUBCUTANEOUS

## 2017-11-30 ENCOUNTER — Telehealth: Payer: Self-pay | Admitting: *Deleted

## 2017-11-30 NOTE — Telephone Encounter (Signed)
As noted below by Dr. Alen Blew, I told patient to hold off on any dental work. He verbalized understanding.

## 2017-11-30 NOTE — Telephone Encounter (Signed)
He needs to hold off on any dental work for now.

## 2017-11-30 NOTE — Telephone Encounter (Signed)
Received voice mail message from patient stating,"I forgot to ask Dr. Alen Blew on Tuesday if it is OK to proceed with a normal dental cleaning on August 1st? Or would he prefer I reschedule after all of my chemotherapy treatments? Return number is [phone]."

## 2017-12-19 ENCOUNTER — Inpatient Hospital Stay: Payer: PPO

## 2017-12-19 ENCOUNTER — Inpatient Hospital Stay: Payer: PPO | Attending: Oncology | Admitting: Oncology

## 2017-12-19 ENCOUNTER — Telehealth: Payer: Self-pay | Admitting: Oncology

## 2017-12-19 VITALS — BP 136/87 | HR 75 | Temp 97.9°F | Resp 18 | Ht 70.0 in | Wt 178.9 lb

## 2017-12-19 DIAGNOSIS — D649 Anemia, unspecified: Secondary | ICD-10-CM | POA: Diagnosis not present

## 2017-12-19 DIAGNOSIS — C786 Secondary malignant neoplasm of retroperitoneum and peritoneum: Secondary | ICD-10-CM | POA: Diagnosis not present

## 2017-12-19 DIAGNOSIS — E119 Type 2 diabetes mellitus without complications: Secondary | ICD-10-CM | POA: Diagnosis not present

## 2017-12-19 DIAGNOSIS — Z452 Encounter for adjustment and management of vascular access device: Secondary | ICD-10-CM | POA: Insufficient documentation

## 2017-12-19 DIAGNOSIS — G620 Drug-induced polyneuropathy: Secondary | ICD-10-CM | POA: Diagnosis not present

## 2017-12-19 DIAGNOSIS — Z79899 Other long term (current) drug therapy: Secondary | ICD-10-CM

## 2017-12-19 DIAGNOSIS — Z7984 Long term (current) use of oral hypoglycemic drugs: Secondary | ICD-10-CM | POA: Diagnosis not present

## 2017-12-19 DIAGNOSIS — Z5111 Encounter for antineoplastic chemotherapy: Secondary | ICD-10-CM | POA: Insufficient documentation

## 2017-12-19 DIAGNOSIS — C61 Malignant neoplasm of prostate: Secondary | ICD-10-CM

## 2017-12-19 DIAGNOSIS — Z95828 Presence of other vascular implants and grafts: Secondary | ICD-10-CM

## 2017-12-19 LAB — CMP (CANCER CENTER ONLY)
ALT: 24 U/L (ref 0–44)
ANION GAP: 12 (ref 5–15)
AST: 16 U/L (ref 15–41)
Albumin: 3.5 g/dL (ref 3.5–5.0)
Alkaline Phosphatase: 66 U/L (ref 38–126)
BUN: 12 mg/dL (ref 8–23)
CALCIUM: 8.6 mg/dL — AB (ref 8.9–10.3)
CO2: 23 mmol/L (ref 22–32)
Chloride: 105 mmol/L (ref 98–111)
Creatinine: 0.79 mg/dL (ref 0.61–1.24)
Glucose, Bld: 175 mg/dL — ABNORMAL HIGH (ref 70–99)
Potassium: 3.9 mmol/L (ref 3.5–5.1)
Sodium: 140 mmol/L (ref 135–145)
Total Bilirubin: 0.3 mg/dL (ref 0.3–1.2)
Total Protein: 6.9 g/dL (ref 6.5–8.1)

## 2017-12-19 LAB — CBC WITH DIFFERENTIAL (CANCER CENTER ONLY)
Basophils Absolute: 0.1 10*3/uL (ref 0.0–0.1)
Basophils Relative: 1 %
EOS ABS: 0.2 10*3/uL (ref 0.0–0.5)
Eosinophils Relative: 2 %
HCT: 31.6 % — ABNORMAL LOW (ref 38.4–49.9)
HEMOGLOBIN: 9.8 g/dL — AB (ref 13.0–17.1)
LYMPHS ABS: 1 10*3/uL (ref 0.9–3.3)
LYMPHS PCT: 14 %
MCH: 28.2 pg (ref 27.2–33.4)
MCHC: 31 g/dL — ABNORMAL LOW (ref 32.0–36.0)
MCV: 91.1 fL (ref 79.3–98.0)
Monocytes Absolute: 0.9 10*3/uL (ref 0.1–0.9)
Monocytes Relative: 11 %
NEUTROS ABS: 5.4 10*3/uL (ref 1.5–6.5)
Neutrophils Relative %: 72 %
Platelet Count: 293 10*3/uL (ref 140–400)
RBC: 3.47 MIL/uL — ABNORMAL LOW (ref 4.20–5.82)
RDW: 18.5 % — ABNORMAL HIGH (ref 11.0–14.6)
WBC: 7.5 10*3/uL (ref 4.0–10.3)

## 2017-12-19 MED ORDER — FAMOTIDINE IN NACL 20-0.9 MG/50ML-% IV SOLN
20.0000 mg | Freq: Once | INTRAVENOUS | Status: AC
  Administered 2017-12-19: 20 mg via INTRAVENOUS

## 2017-12-19 MED ORDER — CARBOPLATIN CHEMO INJECTION 600 MG/60ML
510.0000 mg | Freq: Once | INTRAVENOUS | Status: AC
  Administered 2017-12-19: 510 mg via INTRAVENOUS
  Filled 2017-12-19: qty 51

## 2017-12-19 MED ORDER — SODIUM CHLORIDE 0.9 % IV SOLN
Freq: Once | INTRAVENOUS | Status: AC
  Administered 2017-12-19: 14:00:00 via INTRAVENOUS
  Filled 2017-12-19: qty 250

## 2017-12-19 MED ORDER — FAMOTIDINE IN NACL 20-0.9 MG/50ML-% IV SOLN
INTRAVENOUS | Status: AC
  Filled 2017-12-19: qty 50

## 2017-12-19 MED ORDER — DIPHENHYDRAMINE HCL 50 MG/ML IJ SOLN
INTRAMUSCULAR | Status: AC
  Filled 2017-12-19: qty 1

## 2017-12-19 MED ORDER — SODIUM CHLORIDE 0.9 % IV SOLN
Freq: Once | INTRAVENOUS | Status: AC
  Administered 2017-12-19: 14:00:00 via INTRAVENOUS
  Filled 2017-12-19: qty 5

## 2017-12-19 MED ORDER — DIPHENHYDRAMINE HCL 50 MG/ML IJ SOLN
50.0000 mg | Freq: Once | INTRAMUSCULAR | Status: AC
  Administered 2017-12-19: 50 mg via INTRAVENOUS

## 2017-12-19 MED ORDER — HEPARIN SOD (PORK) LOCK FLUSH 100 UNIT/ML IV SOLN
500.0000 [IU] | Freq: Once | INTRAVENOUS | Status: AC | PRN
  Administered 2017-12-19: 500 [IU]
  Filled 2017-12-19: qty 5

## 2017-12-19 MED ORDER — PALONOSETRON HCL INJECTION 0.25 MG/5ML
INTRAVENOUS | Status: AC
  Filled 2017-12-19: qty 5

## 2017-12-19 MED ORDER — SODIUM CHLORIDE 0.9% FLUSH
10.0000 mL | INTRAVENOUS | Status: DC | PRN
  Administered 2017-12-19: 10 mL via INTRAVENOUS
  Filled 2017-12-19: qty 10

## 2017-12-19 MED ORDER — SODIUM CHLORIDE 0.9 % IV SOLN
175.0000 mg/m2 | Freq: Once | INTRAVENOUS | Status: AC
  Administered 2017-12-19: 354 mg via INTRAVENOUS
  Filled 2017-12-19: qty 59

## 2017-12-19 MED ORDER — PALONOSETRON HCL INJECTION 0.25 MG/5ML
0.2500 mg | Freq: Once | INTRAVENOUS | Status: AC
  Administered 2017-12-19: 0.25 mg via INTRAVENOUS

## 2017-12-19 MED ORDER — ALTEPLASE 2 MG IJ SOLR
2.0000 mg | Freq: Once | INTRAMUSCULAR | Status: AC | PRN
  Administered 2017-12-19: 2 mg
  Filled 2017-12-19: qty 2

## 2017-12-19 MED ORDER — SODIUM CHLORIDE 0.9% FLUSH
10.0000 mL | INTRAVENOUS | Status: DC | PRN
  Administered 2017-12-19: 10 mL
  Filled 2017-12-19: qty 10

## 2017-12-19 MED ORDER — ALTEPLASE 2 MG IJ SOLR
INTRAMUSCULAR | Status: AC
  Filled 2017-12-19: qty 2

## 2017-12-19 NOTE — Progress Notes (Signed)
Nutrition Follow-up:  Patient with recurrent prostate cancer.  Patient receiving palliative chemotherapy.    Met with patient during infusion today.  Patient reports that appetite is pretty good, still volume of food is less than what used to be.  Also tastes are off although has been able to enjoy pizza recently and steak.  Reports that he has been adding additional calories to meal (ie eating more nuts, having taste for potato chips).      Medications: reviewed  Labs: glucose 175  Anthropometrics:   Weight has increased to 178 lb today from 174 lb 1.6 oz on 7/23.     NUTRITION DIAGNOSIS: Malnutrition improving   MALNUTRITION DIAGNOSIS: severe malnutrition continues   INTERVENTION:  Encouraged patient to continue to choose foods high in calories and protein.  Patient planning trip to [place] for 50th wedding anniversary on 9/6 and looking forward to enjoying the food.      MONITORING, EVALUATION, GOAL: Patient will consume adequate calories and protein to maintain weight   NEXT VISIT: September 10 during infusion  Frank Larsen Frank Larsen, Frank Larsen, Frank Larsen Registered Dietitian [phone] (pager)

## 2017-12-19 NOTE — Patient Instructions (Signed)
Implanted Port Home Guide An implanted port is a type of central line that is placed under the skin. Central lines are used to provide IV access when treatment or nutrition needs to be given through a person's veins. Implanted ports are used for long-term IV access. An implanted port may be placed because:  You need IV medicine that would be irritating to the small veins in your hands or arms.  You need long-term IV medicines, such as antibiotics.  You need IV nutrition for a long period.  You need frequent blood draws for lab tests.  You need dialysis.  Implanted ports are usually placed in the chest area, but they can also be placed in the upper arm, the abdomen, or the leg. An implanted port has two main parts:  Reservoir. The reservoir is round and will appear as a small, raised area under your skin. The reservoir is the part where a needle is inserted to give medicines or draw blood.  Catheter. The catheter is a thin, flexible tube that extends from the reservoir. The catheter is placed into a large vein. Medicine that is inserted into the reservoir goes into the catheter and then into the vein.  How will I care for my incision site? Do not get the incision site wet. Bathe or shower as directed by your health care provider. How is my port accessed? Special steps must be taken to access the port:  Before the port is accessed, a numbing cream can be placed on the skin. This helps numb the skin over the port site.  Your health care provider uses a sterile technique to access the port. ? Your health care provider must put on a mask and sterile gloves. ? The skin over your port is cleaned carefully with an antiseptic and allowed to dry. ? The port is gently pinched between sterile gloves, and a needle is inserted into the port.  Only "non-coring" port needles should be used to access the port. Once the port is accessed, a blood return should be checked. This helps ensure that the port  is in the vein and is not clogged.  If your port needs to remain accessed for a constant infusion, a clear (transparent) bandage will be placed over the needle site. The bandage and needle will need to be changed every week, or as directed by your health care provider.  Keep the bandage covering the needle clean and dry. Do not get it wet. Follow your health care provider's instructions on how to take a shower or bath while the port is accessed.  If your port does not need to stay accessed, no bandage is needed over the port.  What is flushing? Flushing helps keep the port from getting clogged. Follow your health care provider's instructions on how and when to flush the port. Ports are usually flushed with saline solution or a medicine called heparin. The need for flushing will depend on how the port is used.  If the port is used for intermittent medicines or blood draws, the port will need to be flushed: ? After medicines have been given. ? After blood has been drawn. ? As part of routine maintenance.  If a constant infusion is running, the port may not need to be flushed.  How long will my port stay implanted? The port can stay in for as long as your health care provider thinks it is needed. When it is time for the port to come out, surgery will be   done to remove it. The procedure is similar to the one performed when the port was put in. When should I seek immediate medical care? When you have an implanted port, you should seek immediate medical care if:  You notice a bad smell coming from the incision site.  You have swelling, redness, or drainage at the incision site.  You have more swelling or pain at the port site or the surrounding area.  You have a fever that is not controlled with medicine.  This information is not intended to replace advice given to you by your health care provider. Make sure you discuss any questions you have with your health care provider. Document  Released: 04/25/2005 Document Revised: 10/01/2015 Document Reviewed: 12/31/2012 Elsevier Interactive Patient Education  2017 [employer].  

## 2017-12-19 NOTE — Telephone Encounter (Signed)
Appts scheduled AVS/Calendar printed per 8/13 los °

## 2017-12-19 NOTE — Patient Instructions (Signed)
    Cancer Center Discharge Instructions for Patients Receiving Chemotherapy  Today you received the following chemotherapy agents Taxol and Carboplatin   To help prevent nausea and vomiting after your treatment, we encourage you to take your nausea medication as directed.    If you develop nausea and vomiting that is not controlled by your nausea medication, call the clinic.   BELOW ARE SYMPTOMS THAT SHOULD BE REPORTED IMMEDIATELY:  *FEVER GREATER THAN 100.5 F  *CHILLS WITH OR WITHOUT FEVER  NAUSEA AND VOMITING THAT IS NOT CONTROLLED WITH YOUR NAUSEA MEDICATION  *UNUSUAL SHORTNESS OF BREATH  *UNUSUAL BRUISING OR BLEEDING  TENDERNESS IN MOUTH AND THROAT WITH OR WITHOUT PRESENCE OF ULCERS  *URINARY PROBLEMS  *BOWEL PROBLEMS  UNUSUAL RASH Items with * indicate a potential emergency and should be followed up as soon as possible.  Feel free to call the clinic should you have any questions or concerns. The clinic phone number is [phone].  Please show the CHEMO ALERT CARD at check-in to the Emergency Department and triage nurse.   

## 2017-12-19 NOTE — Progress Notes (Signed)
Hematology and Oncology Follow Up Visit  Frank Larsen [id number] 11-10-1945 72 y.o. 12/19/2017 9:31 AM Frank Larsen, Frank Brow, MD   Principle Diagnosis: 72 year old man with prostate cancer diagnosed in 2013.  He subsequently developed poorly differentiated tumor with neuroendocrine features with the peritoneal involvement diagnosed in 2017.    Prior Therapy:  He is status post robotic-assisted laparoscopic radical prostatectomy and extended lymphadenectomy with the pathology reveals T2N0.  completed in June 2013. Taxotere chemotherapy at 75 mg/m every 3 weeks started on 03/30/2016. He is status post 9 cycles of therapy.  He developed relapsed disease and a biopsy proven to show neuroendocrine poorly differentiated tumor in June 2018. He is S/P Carboplatin and etoposide. Cycle 1 given on 10/24/2016. He completed cycle 6 on February 13, 2017 with near complete response.  Current therapy: Carboplatin and paclitaxel cycle 1 given on 10/17/2017.  He completed 3 cycles of therapy.  Interim History: Frank Larsen is here for a follow-up visit.  Since the last visit, he reports no major changes in his health.  He continues to tolerate chemotherapy without any major complications.  He denies any excessive fatigue or tiredness.  He denies any nausea, vomiting or change in his bowel habits.  He continues to have issues with risk factor support after each cycle of chemotherapy.  He reports arthralgias, myalgias and overall decline in his performance status that has improved in the last week.  He does report grade 1 peripheral neuropathy which is unchanged.  He does not report any headaches, blurry vision, syncope or seizures.  He denied any dizziness or confusion. He does not report any fevers, chills, sweats.  He does not report any chest pain, palpitation, orthopnea or leg edema. He does not report any cough, wheezing or hemoptysis. He does not report any constipation or diarrhea.  He  does not  report any frequency, urgency or hematuria. He does not report any paralysis or myalgias.  He denies any petechia or easy bruising.  He denies any clotting tendencies.  He denies any lymphadenopathy or petechiae. Remaining review of systems is negative.   Medications: I have reviewed the patient's current medications.  Current Outpatient Medications  Medication Sig Dispense Refill  . atorvastatin (LIPITOR) 10 MG tablet Take 10 mg by mouth daily with breakfast.     . Blood Glucose Monitoring Suppl (ONE TOUCH ULTRA 2) w/Device KIT Use to check blood sugar 2 times per day. 1 each 2  . calcium carbonate (TUMS - DOSED IN MG ELEMENTAL CALCIUM) 500 MG chewable tablet Chew 1 tablet (200 mg of elemental calcium total) by mouth 2 (two) times daily as needed for indigestion or heartburn. 30 tablet 0  . glipiZIDE (GLUCOTROL XL) 5 MG 24 hr tablet Take 1 tablet (5 mg total) by mouth daily with breakfast. 90 tablet 3  . glucose blood (ONE TOUCH ULTRA TEST) test strip Use to check blood sugar 2 times per day. 200 each 5  . HYDROcodone-acetaminophen (NORCO/VICODIN) 5-325 MG tablet Take 1-2 tablets by mouth every 4 (four) hours as needed. (Patient taking differently: Take 1-2 tablets by mouth every 4 (four) hours as needed for moderate pain or severe pain. ) 90 tablet 0  . hydrOXYzine (ATARAX/VISTARIL) 10 MG tablet TAKE 1 TABLET BY MOUTH EVERY 6 HOURS AS NEEDED (Patient taking differently: TAKE 1 TABLET BY MOUTH EVERY 6 HOURS AS NEEDED FOR ITCHING) 60 tablet 0  . lidocaine-prilocaine (EMLA) cream Apply 1 application topically as needed. Apply to port before chemotherapy. 30 g  0  . loperamide (IMODIUM A-D) 2 MG tablet Take 2 mg by mouth 4 (four) times daily as needed for diarrhea or loose stools.    . magnesium chloride (SLOW-MAG) 64 MG TBEC SR tablet Take 1 tablet (64 mg total) by mouth 2 (two) times daily. 60 tablet 0  . metFORMIN (GLUCOPHAGE) 1000 MG tablet Take 1 tablet (1,000 mg total) by mouth 2 (two) times daily  with a meal. 180 tablet 3  . Multiple Vitamin (MULTIVITAMIN) tablet Take 1 tablet by mouth daily.    . naproxen sodium (ALEVE) 220 MG tablet Take 440 mg by mouth 2 (two) times daily as needed (pain).    [name] omeprazole (PRILOSEC) 20 MG capsule Take 20 mg by mouth every morning.    [name] DELICA LANCETS 13Y MISC Use to check blood sugar 2 times per day. 200 each 2  . prochlorperazine (COMPAZINE) 10 MG tablet Take 1 tablet (10 mg total) by mouth every 6 (six) hours as needed for nausea or vomiting. 30 tablet 0  . pseudoephedrine-acetaminophen (TYLENOL SINUS) 30-500 MG TABS tablet Take 1 tablet by mouth daily as needed (congestion).     . sitaGLIPtin (JANUVIA) 100 MG tablet Take 1 tablet (100 mg total) by mouth daily. 90 tablet 3   No current facility-administered medications for this visit.    Facility-Administered Medications Ordered in Other Visits  Medication Dose Route Frequency Provider Last Rate Last Dose  . sodium chloride flush (NS) 0.9 % injection 10 mL  10 mL Intravenous PRN Wyatt Portela, MD   10 mL at 12/19/17 8657     Allergies:  Allergies  Allergen Reactions  . Scallops [Shellfish Allergy] Nausea And Vomiting  . Cephalexin Hives and Rash    Past Medical History, Surgical history, Social history, and Family History updated and remain unchanged.   Physical Exam: Blood pressure 136/87, pulse 75, temperature 97.9 F (36.6 C), temperature source Oral, resp. rate 18, height '5\' 10"'  (1.778 m), weight 178 lb 14.4 oz (81.1 kg), SpO2 100 %.     ECOG: 1 General appearance: Well-appearing gentleman without distress. Head: Normal cephalic without abnormalities. Oral mucosa: No oral thrush or ulcers. Lymph nodes: No cervical, supraclavicular, inguinal or axillary lymphadenopathy. Heart: Regular rate and rhythm.  S1-S2 without leg edema. Lung: Clear that any rhonchi, wheezes or dullness to percussion. Abdomin: Soft, without any rebound or guarding.  No shifting dullness or  ascites. Musculoskeletal: No finger cyanosis. Skin: Ecchymosis petechiae. Neurological: No deficits noted motor, sensory and deep tendon reflexes. Psychiatric: Appropriate mood and affect.  Lab Results: Lab Results  Component Value Date   WBC 8.6 11/28/2017   HGB 10.0 (L) 11/28/2017   HCT 31.8 (L) 11/28/2017   MCV 89.3 11/28/2017   PLT 287 11/28/2017     Chemistry      Component Value Date/Time   NA 141 11/28/2017 0813   NA 140 1Aug 14, 202018 1136   K 3.8 11/28/2017 0813   K 3.9 1Aug 14, 202018 1136   CL 106 11/28/2017 0813   CO2 25 11/28/2017 0813   CO2 25 1Aug 14, 202018 1136   BUN 12 11/28/2017 0813   BUN 15.8 1Aug 14, 202018 1136   CREATININE 0.97 11/28/2017 0813   CREATININE 0.9 1Aug 14, 202018 1136   GLU 155 08/24/2015      Component Value Date/Time   CALCIUM 8.8 (L) 11/28/2017 0813   CALCIUM 9.2 1Aug 14, 202018 1136   ALKPHOS 77 11/28/2017 0813   ALKPHOS 55 1Aug 14, 202018 1136   AST 17 11/28/2017 0813   AST 19  112-17-202018 1136   ALT 23 11/28/2017 0813   ALT 15 112-17-202018 1136   BILITOT 0.2 (L) 11/28/2017 0813   BILITOT 0.32 112-17-202018 1136         Impression and Plan:   72 year old man with:  1.  Prostate cancer diagnosed in 2013 and subsequently developed metastatic poorly differentiated prostate cancer with neuroendocrine features involving the peritoneum.  He remains on carboplatin and paclitaxel with excellent clinical response.  Risks and benefits of continuing therapy at this time was reviewed and is agreeable to continue.  He will complete 6 cycles of therapy in October 2019 and will have repeat imaging studies at that time.  2.  Abdominal pain: His pain is predominantly related to colitis and has resolved at this time.  He is no longer taking any pain medication.   3. IV access: Port-A-Cath main in use without any issues.  4.  Pruritus: Solved predominantly at this time.   5. Anemia: Stable without any need for transfusion.  His anemia is related to chronic disease and  malignancy.  6.  Neutropenia prophylaxis: He will not receive growth factor support after each cycle given the poor tolerance.  Risk of infection was reviewed today but he is willing to take that risk at this time.  7.  Prognosis: His prognosis remains good at this time given his excellent performance status and tolerance to chemotherapy.  Treatment is palliative.  8. Follow-up: Will be in 4 weeks for the next cycle of chemotherapy.  25  minutes was spent with the patient face-to-face today.  More than 50% of time was dedicated to discussing the natural course of this disease, reviewing previous imaging studies and coordinating his future plan of care.Zola Button, MD 8/13/20199:31 AM

## 2017-12-20 ENCOUNTER — Telehealth: Payer: Self-pay | Admitting: *Deleted

## 2017-12-20 ENCOUNTER — Encounter: Payer: Self-pay | Admitting: Oncology

## 2017-12-20 ENCOUNTER — Ambulatory Visit: Payer: PPO

## 2017-12-20 NOTE — Telephone Encounter (Signed)
Patient sent a my chart message. He was concerned that his port needed cath-flo to work. Explained that this does happen and we have medication to help. Not to worry.

## 2017-12-21 ENCOUNTER — Ambulatory Visit: Payer: PPO

## 2018-01-16 ENCOUNTER — Inpatient Hospital Stay: Payer: PPO

## 2018-01-16 ENCOUNTER — Inpatient Hospital Stay: Payer: PPO | Attending: Oncology

## 2018-01-16 ENCOUNTER — Telehealth: Payer: Self-pay

## 2018-01-16 ENCOUNTER — Inpatient Hospital Stay (HOSPITAL_BASED_OUTPATIENT_CLINIC_OR_DEPARTMENT_OTHER): Payer: PPO | Admitting: Oncology

## 2018-01-16 VITALS — BP 123/79 | HR 67 | Temp 98.2°F | Resp 18 | Ht 70.0 in | Wt 182.2 lb

## 2018-01-16 DIAGNOSIS — Z7984 Long term (current) use of oral hypoglycemic drugs: Secondary | ICD-10-CM | POA: Insufficient documentation

## 2018-01-16 DIAGNOSIS — D649 Anemia, unspecified: Secondary | ICD-10-CM

## 2018-01-16 DIAGNOSIS — E119 Type 2 diabetes mellitus without complications: Secondary | ICD-10-CM | POA: Diagnosis not present

## 2018-01-16 DIAGNOSIS — C786 Secondary malignant neoplasm of retroperitoneum and peritoneum: Secondary | ICD-10-CM | POA: Diagnosis not present

## 2018-01-16 DIAGNOSIS — Z5111 Encounter for antineoplastic chemotherapy: Secondary | ICD-10-CM | POA: Diagnosis not present

## 2018-01-16 DIAGNOSIS — Z79899 Other long term (current) drug therapy: Secondary | ICD-10-CM | POA: Insufficient documentation

## 2018-01-16 DIAGNOSIS — C61 Malignant neoplasm of prostate: Secondary | ICD-10-CM

## 2018-01-16 DIAGNOSIS — G629 Polyneuropathy, unspecified: Secondary | ICD-10-CM | POA: Insufficient documentation

## 2018-01-16 DIAGNOSIS — Z95828 Presence of other vascular implants and grafts: Secondary | ICD-10-CM

## 2018-01-16 DIAGNOSIS — Z452 Encounter for adjustment and management of vascular access device: Secondary | ICD-10-CM | POA: Diagnosis present

## 2018-01-16 LAB — CBC WITH DIFFERENTIAL (CANCER CENTER ONLY)
Basophils Absolute: 0 10*3/uL (ref 0.0–0.1)
Basophils Relative: 1 %
EOS PCT: 1 %
Eosinophils Absolute: 0.1 10*3/uL (ref 0.0–0.5)
HEMATOCRIT: 31.1 % — AB (ref 38.4–49.9)
Hemoglobin: 10.2 g/dL — ABNORMAL LOW (ref 13.0–17.1)
LYMPHS PCT: 13 %
Lymphs Abs: 0.5 10*3/uL — ABNORMAL LOW (ref 0.9–3.3)
MCH: 29.4 pg (ref 27.2–33.4)
MCHC: 32.7 g/dL (ref 32.0–36.0)
MCV: 89.9 fL (ref 79.3–98.0)
Monocytes Absolute: 0.6 10*3/uL (ref 0.1–0.9)
Monocytes Relative: 14 %
NEUTROS ABS: 2.9 10*3/uL (ref 1.5–6.5)
Neutrophils Relative %: 71 %
Platelet Count: 207 10*3/uL (ref 140–400)
RBC: 3.46 MIL/uL — AB (ref 4.20–5.82)
RDW: 19.2 % — ABNORMAL HIGH (ref 11.0–14.6)
WBC: 4.1 10*3/uL (ref 4.0–10.3)

## 2018-01-16 LAB — CMP (CANCER CENTER ONLY)
ALK PHOS: 66 U/L (ref 38–126)
ALT: 20 U/L (ref 0–44)
AST: 17 U/L (ref 15–41)
Albumin: 3.6 g/dL (ref 3.5–5.0)
Anion gap: 10 (ref 5–15)
BILIRUBIN TOTAL: 0.4 mg/dL (ref 0.3–1.2)
BUN: 16 mg/dL (ref 8–23)
CALCIUM: 9.1 mg/dL (ref 8.9–10.3)
CO2: 25 mmol/L (ref 22–32)
CREATININE: 0.92 mg/dL (ref 0.61–1.24)
Chloride: 106 mmol/L (ref 98–111)
GFR, Estimated: >60 mL/min (ref >60)
Glucose, Bld: 194 mg/dL — ABNORMAL HIGH (ref 70–99)
Potassium: 4 mmol/L (ref 3.5–5.1)
Sodium: 141 mmol/L (ref 135–145)
Total Protein: 6.9 g/dL (ref 6.5–8.1)

## 2018-01-16 MED ORDER — PALONOSETRON HCL INJECTION 0.25 MG/5ML
INTRAVENOUS | Status: AC
  Filled 2018-01-16: qty 5

## 2018-01-16 MED ORDER — DIPHENHYDRAMINE HCL 50 MG/ML IJ SOLN
50.0000 mg | Freq: Once | INTRAMUSCULAR | Status: AC
  Administered 2018-01-16: 50 mg via INTRAVENOUS

## 2018-01-16 MED ORDER — FAMOTIDINE IN NACL 20-0.9 MG/50ML-% IV SOLN
INTRAVENOUS | Status: AC
  Filled 2018-01-16: qty 50

## 2018-01-16 MED ORDER — PALONOSETRON HCL INJECTION 0.25 MG/5ML
0.2500 mg | Freq: Once | INTRAVENOUS | Status: AC
  Administered 2018-01-16: 0.25 mg via INTRAVENOUS

## 2018-01-16 MED ORDER — SODIUM CHLORIDE 0.9 % IV SOLN
Freq: Once | INTRAVENOUS | Status: AC
  Administered 2018-01-16: 11:00:00 via INTRAVENOUS
  Filled 2018-01-16: qty 5

## 2018-01-16 MED ORDER — DIPHENHYDRAMINE HCL 50 MG/ML IJ SOLN
INTRAMUSCULAR | Status: AC
  Filled 2018-01-16: qty 1

## 2018-01-16 MED ORDER — SODIUM CHLORIDE 0.9 % IV SOLN
510.0000 mg | Freq: Once | INTRAVENOUS | Status: AC
  Administered 2018-01-16: 510 mg via INTRAVENOUS
  Filled 2018-01-16: qty 51

## 2018-01-16 MED ORDER — SODIUM CHLORIDE 0.9 % IV SOLN
175.0000 mg/m2 | Freq: Once | INTRAVENOUS | Status: AC
  Administered 2018-01-16: 354 mg via INTRAVENOUS
  Filled 2018-01-16: qty 59

## 2018-01-16 MED ORDER — SODIUM CHLORIDE 0.9 % IV SOLN
Freq: Once | INTRAVENOUS | Status: AC
  Administered 2018-01-16: 11:00:00 via INTRAVENOUS
  Filled 2018-01-16: qty 250

## 2018-01-16 MED ORDER — SODIUM CHLORIDE 0.9% FLUSH
10.0000 mL | INTRAVENOUS | Status: DC | PRN
  Administered 2018-01-16: 10 mL
  Filled 2018-01-16: qty 10

## 2018-01-16 MED ORDER — HEPARIN SOD (PORK) LOCK FLUSH 100 UNIT/ML IV SOLN
500.0000 [IU] | Freq: Once | INTRAVENOUS | Status: AC | PRN
  Administered 2018-01-16: 500 [IU]
  Filled 2018-01-16: qty 5

## 2018-01-16 MED ORDER — FAMOTIDINE IN NACL 20-0.9 MG/50ML-% IV SOLN
20.0000 mg | Freq: Once | INTRAVENOUS | Status: AC
  Administered 2018-01-16: 20 mg via INTRAVENOUS

## 2018-01-16 MED ORDER — SODIUM CHLORIDE 0.9% FLUSH
10.0000 mL | INTRAVENOUS | Status: DC | PRN
  Administered 2018-01-16: 10 mL via INTRAVENOUS
  Filled 2018-01-16: qty 10

## 2018-01-16 NOTE — Progress Notes (Signed)
Hematology and Oncology Follow Up Visit  Frank Larsen [id number] 01/15/1946 72 y.o. 01/16/2018 8:53 AM Frank Larsen, Frank Brow, MD   Principle Diagnosis: 72 year old man with advanced prostate cancer with neuroendocrine feature and peritoneal metastasis diagnosed in 2017.  He was initially diagnosed with prostate adenocarcinoma in 2013.     Prior Therapy:  He is status post robotic-assisted laparoscopic radical prostatectomy and extended lymphadenectomy with the pathology reveals T2N0.  completed in June 2013. Taxotere chemotherapy at 75 mg/m every 3 weeks started on 03/30/2016. He is status post 9 cycles of therapy.  He developed relapsed disease and a biopsy proven to show neuroendocrine poorly differentiated tumor in June 2018. He is S/P Carboplatin and etoposide. Cycle 1 given on 10/24/2016. He completed cycle 6 on February 13, 2017 with near complete response.  Current therapy: Carboplatin and paclitaxel cycle 1 given on 10/17/2017.  He completed 4 cycles of therapy.  Interim History: Mr. Berg today for a follow-up.  Since her last visit, he reports no major changes or complaints.  He was able to travel to [place] and enjoy relaxing vacation.  He did not have any complications related to the last cycle of chemotherapy.  He denies any nausea, vomiting or infusion related toxicities.  He does report grade 1 neuropathy that is manageable.  He did not receive growth factor support after the last cycle which helped his symptoms at this time.  He denies any recent infections or hospitalizations.  He does not report any headaches, blurry vision, syncope or seizures.  He denied any worsening mental status.  He does not report any fevers, chills, sweats.  He does not report any chest pain, palpitation, orthopnea or leg edema. He does not report any cough, wheezing or hemoptysis. He does not report any change in his bowel habits.  He  does not report any frequency, urgency or hematuria. He  does not report any pathological fractures or bone pain.  He denies any petechia or easy bruising.  He denies any skin rashes or lesions.  He denies any lymphadenopathy or petechiae.  Denies any changes in his mood.  Remaining review of systems is negative.   Medications: I have reviewed the patient's current medications.  Current Outpatient Medications  Medication Sig Dispense Refill  . atorvastatin (LIPITOR) 10 MG tablet Take 10 mg by mouth daily with breakfast.     . Blood Glucose Monitoring Suppl (ONE TOUCH ULTRA 2) w/Device KIT Use to check blood sugar 2 times per day. 1 each 2  . calcium carbonate (TUMS - DOSED IN MG ELEMENTAL CALCIUM) 500 MG chewable tablet Chew 1 tablet (200 mg of elemental calcium total) by mouth 2 (two) times daily as needed for indigestion or heartburn. 30 tablet 0  . glipiZIDE (GLUCOTROL XL) 5 MG 24 hr tablet Take 1 tablet (5 mg total) by mouth daily with breakfast. 90 tablet 3  . glucose blood (ONE TOUCH ULTRA TEST) test strip Use to check blood sugar 2 times per day. 200 each 5  . HYDROcodone-acetaminophen (NORCO/VICODIN) 5-325 MG tablet Take 1-2 tablets by mouth every 4 (four) hours as needed. (Patient taking differently: Take 1-2 tablets by mouth every 4 (four) hours as needed for moderate pain or severe pain. ) 90 tablet 0  . hydrOXYzine (ATARAX/VISTARIL) 10 MG tablet TAKE 1 TABLET BY MOUTH EVERY 6 HOURS AS NEEDED (Patient taking differently: TAKE 1 TABLET BY MOUTH EVERY 6 HOURS AS NEEDED FOR ITCHING) 60 tablet 0  . lidocaine-prilocaine (EMLA) cream Apply 1  application topically as needed. Apply to port before chemotherapy. 30 g 0  . loperamide (IMODIUM A-D) 2 MG tablet Take 2 mg by mouth 4 (four) times daily as needed for diarrhea or loose stools.    . metFORMIN (GLUCOPHAGE) 1000 MG tablet Take 1 tablet (1,000 mg total) by mouth 2 (two) times daily with a meal. 180 tablet 3  . Multiple Vitamin (MULTIVITAMIN) tablet Take 1 tablet by mouth daily.    . naproxen sodium  (ALEVE) 220 MG tablet Take 440 mg by mouth 2 (two) times daily as needed (pain).    [name] omeprazole (PRILOSEC) 20 MG capsule Take 20 mg by mouth every morning.    [name] DELICA LANCETS 16X MISC Use to check blood sugar 2 times per day. 200 each 2  . prochlorperazine (COMPAZINE) 10 MG tablet Take 1 tablet (10 mg total) by mouth every 6 (six) hours as needed for nausea or vomiting. 30 tablet 0  . pseudoephedrine-acetaminophen (TYLENOL SINUS) 30-500 MG TABS tablet Take 1 tablet by mouth daily as needed (congestion).     . sitaGLIPtin (JANUVIA) 100 MG tablet Take 1 tablet (100 mg total) by mouth daily. 90 tablet 3   No current facility-administered medications for this visit.    Facility-Administered Medications Ordered in Other Visits  Medication Dose Route Frequency Provider Last Rate Last Dose  . sodium chloride flush (NS) 0.9 % injection 10 mL  10 mL Intravenous PRN Wyatt Portela, MD   10 mL at 01/16/18 0847     Allergies:  Allergies  Allergen Reactions  . Scallops [Shellfish Allergy] Nausea And Vomiting  . Cephalexin Hives and Rash    Past Medical History, Surgical history, Social history, and Family History updated and remain unchanged.   Physical Exam: Blood pressure 123/79, pulse 67, temperature 98.2 F (36.8 C), temperature source Oral, resp. rate 18, height _0  (1.778 m), weight 182 lb 3.2 oz (82.6 kg), SpO2 100 %.   ECOG: 1    General appearance: Comfortable appearing without any discomfort Head: Normocephalic without any trauma Oropharynx: Mucous membranes are moist and pink without any thrush or ulcers. Eyes: Pupils are equal and round reactive to light. Lymph nodes: No cervical, supraclavicular, inguinal or axillary lymphadenopathy.   Heart:regular rate and rhythm.  S1 and S2 without leg edema. Lung: Clear without any rhonchi or wheezes.  No dullness to percussion. Abdomin: Soft, nontender, nondistended with good bowel sounds.  No  hepatosplenomegaly. Musculoskeletal: No joint deformity or effusion.  Full range of motion noted. Neurological: No deficits noted on motor, sensory and deep tendon reflex exam. Skin: No petechial rash or dryness.  Appeared moist.    Lab Results: Lab Results  Component Value Date   WBC 7.5 12/19/2017   HGB 9.8 (L) 12/19/2017   HCT 31.6 (L) 12/19/2017   MCV 91.1 12/19/2017   PLT 293 12/19/2017     Chemistry      Component Value Date/Time   NA 140 12/19/2017 0852   NA 140 103-22-202018 1136   K 3.9 12/19/2017 0852   K 3.9 103-22-202018 1136   CL 105 12/19/2017 0852   CO2 23 12/19/2017 0852   CO2 25 103-22-202018 1136   BUN 12 12/19/2017 0852   BUN 15.8 103-22-202018 1136   CREATININE 0.79 12/19/2017 0852   CREATININE 0.9 103-22-202018 1136   GLU 155 08/24/2015      Component Value Date/Time   CALCIUM 8.6 (L) 12/19/2017 0852   CALCIUM 9.2 103-22-202018 1136   ALKPHOS 66  12/19/2017 0852   ALKPHOS 55 105-Apr-202018 1136   AST 16 12/19/2017 0852   AST 19 105-Apr-202018 1136   ALT 24 12/19/2017 0852   ALT 15 105-Apr-202018 1136   BILITOT 0.3 12/19/2017 0852   BILITOT 0.32 105-Apr-202018 1136         Impression and Plan:   72 year old man with:  1.  Advanced poorly differentiated prostate cancer with documented metastatic disease to the peritoneum.  His tumor exhibiting neuroendocrine differentiation.  He is status post multiple therapies outlined above.  He is currently receiving carboplatin and paclitaxel without any major complications.  He had benefited clinically at this time with imaging studies obtained in June 2017 showed stable disease.  Risks and benefits of continuing therapy was reviewed today he is agreeable to continue.  The plan is to continue with 6 cycles of therapy and repeat imaging studies after that.  2.  Abdominal pain: Resolved at this time without any evidence of recurrence.   3. IV access: Port-A-Cath remain in place without issues.     4. Anemia: His hemoglobin improved  up to 10.2 without any need for a transfusion.  5.  Neutropenia prophylaxis: We will discontinue growth factor support for the time being.  He has tolerated this poorly and understands the risk of neutropenic infections.  He will report any fevers or infectious symptoms.  6.  Prognosis: His disease is incurable but his performance status is excellent and aggressive therapy is warranted.  7. Follow-up: Will be in 3 weeks for the next cycle of chemotherapy.  25  minutes was spent with the patient face-to-face today.  More than 50% of time was dedicated to reviewing the natural course of his disease, reviewing imaging studies and coordinating plan of care.  Zola Button, MD 9/10/20198:53 AM

## 2018-01-16 NOTE — Patient Instructions (Signed)
[facility] Discharge Instructions for Patients Receiving Chemotherapy  Today you received the following chemotherapy agents: Paclitaxel (Taxol) and Carboplatin (Paraplatin)  To help prevent nausea and vomiting after your treatment, we encourage you to take your nausea medication as directed.    If you develop nausea and vomiting that is not controlled by your nausea medication, call the clinic.   BELOW ARE SYMPTOMS THAT SHOULD BE REPORTED IMMEDIATELY:  *FEVER GREATER THAN 100.5 F  *CHILLS WITH OR WITHOUT FEVER  NAUSEA AND VOMITING THAT IS NOT CONTROLLED WITH YOUR NAUSEA MEDICATION  *UNUSUAL SHORTNESS OF BREATH  *UNUSUAL BRUISING OR BLEEDING  TENDERNESS IN MOUTH AND THROAT WITH OR WITHOUT PRESENCE OF ULCERS  *URINARY PROBLEMS  *BOWEL PROBLEMS  UNUSUAL RASH Items with * indicate a potential emergency and should be followed up as soon as possible.  Feel free to call the clinic should you have any questions or concerns. The clinic phone number is [phone].  Please show the CHEMO ALERT CARD at check-in to the Emergency Department and triage nurse.   

## 2018-01-16 NOTE — Telephone Encounter (Signed)
Patient called concerning his injection. Per 9/10 phone message return. I Levada Dy) cancel the infusion appointment and scheduled the injection appointment instead

## 2018-01-16 NOTE — Progress Notes (Signed)
Nutrition Follow-up:  Patient with recurrent prostate cancer.  Patient receiving palliative chemotherapy.    Met with patient and wife during infusion this am.  Patient and wife just celebrated 50th wedding anniversary in [place]. Reports had a great time and really enjoyed the meals.  "It was higher than 5 star rating."  No nutrition impact symptoms reported   Medications: reviewed  Labs: reviewed  Anthropometrics:   Weight increased to 182 lb 3.2 oz from 178 lb on 8/13  UBW of 195-200 lb  NUTRITION DIAGNOSIS: Malnutrition improved/resolved   MALNUTRITION DIAGNOSIS: severe malnutrition improved/resolved   INTERVENTION:  Encouraged patient to continue eating good sources of protein and calories.   Patient denies further questions at this time.     MONITORING, EVALUATION, GOAL: Patient continue to consume adequate calories and protein to maintain weight   NEXT VISIT: as needed, patient to contact RD  Alecxander Mainwaring [name], [place], [place] Registered Dietitian [phone] (pager)

## 2018-01-16 NOTE — Telephone Encounter (Signed)
Per 9/10 no los 

## 2018-01-17 ENCOUNTER — Ambulatory Visit: Payer: PPO

## 2018-01-28 ENCOUNTER — Encounter: Payer: Self-pay | Admitting: Oncology

## 2018-01-29 NOTE — Telephone Encounter (Signed)
Notified that it is OK to take flu shot

## 2018-01-31 ENCOUNTER — Encounter: Payer: Self-pay | Admitting: Internal Medicine

## 2018-01-31 ENCOUNTER — Telehealth: Payer: Self-pay | Admitting: Internal Medicine

## 2018-01-31 ENCOUNTER — Ambulatory Visit (INDEPENDENT_AMBULATORY_CARE_PROVIDER_SITE_OTHER): Payer: PPO | Admitting: Internal Medicine

## 2018-01-31 VITALS — BP 126/62 | HR 61 | Ht 70.0 in | Wt 183.0 lb

## 2018-01-31 DIAGNOSIS — Z23 Encounter for immunization: Secondary | ICD-10-CM

## 2018-01-31 DIAGNOSIS — E1140 Type 2 diabetes mellitus with diabetic neuropathy, unspecified: Secondary | ICD-10-CM | POA: Diagnosis not present

## 2018-01-31 DIAGNOSIS — E7800 Pure hypercholesterolemia, unspecified: Secondary | ICD-10-CM | POA: Diagnosis not present

## 2018-01-31 LAB — POCT GLYCOSYLATED HEMOGLOBIN (HGB A1C): HEMOGLOBIN A1C: 7.8 % — AB (ref 4.0–5.6)

## 2018-01-31 MED ORDER — GLIPIZIDE ER 5 MG PO TB24: 10.0000 mg | ORAL_TABLET | Freq: Every day | ORAL | 3 refills | Status: AC

## 2018-01-31 NOTE — Telephone Encounter (Signed)
Patient received a flu shot this morning while he was in the office for his appointment.  He would like to know if this was the senior flu shot. It did not indicate this on his after visit summary.   Please advise

## 2018-01-31 NOTE — Addendum Note (Signed)
Addended by: Cardell Peach I on: 01/31/2018 09:40 AM   Modules accepted: Orders

## 2018-01-31 NOTE — Telephone Encounter (Signed)
Spoke to patient and let him know he did receive the high dose.

## 2018-01-31 NOTE — Patient Instructions (Addendum)
Please change from Ensure to Glucerna.  Please continue:  - Januvia 100 mg before b'fast  Please increase: - Glipizide ER to 10 mg before b'fast  Please return in 3 months with your sugar log.

## 2018-01-31 NOTE — Progress Notes (Addendum)
Patient ID: Frank Larsen, male   DOB: 1945/12/27, 72 y.o.   MRN: [id number]  HPI: Frank Larsen is a 72 y.o.-year-old male, returning for f/u for DM2, dx in 2013, non-insulin-dependent, fairly well controlled, with complications (PN). Last visit 4 months ago.  He was in the hospital for AP (colitis) since last visit. His Metformin was stopped afterwards - ~3 mo ago.   He has metastatic prostate cancer, diagnosed in 2013.  He was initially on a chemotherapy regimen that did not work, then change the chemotherapy regimen and added Neulasta (now stopped).  This was stopped in 02/2017.  He was on dexamethasone with a treatments.  He had radiation treatments in 2015. He restarted ChTx this summer.  He was advised to increase his calorie intake  As he was losing weight >> now on Ensure.  He went to [place] earlier this month for his 50th anniversary. It was a wonderful vacation!  Last hemoglobin A1c was: Lab Results  Component Value Date   HGBA1C 7.6 (A) 09/29/2017   HGBA1C 7.2 06/01/2017   HGBA1C 6.8 01/30/2017  02/17/2015: 7.7% 10/2014: 7.6%  Pt is on a regimen of:  >> stopped  - Glipizide ER 5 mg before b'fast - Januvia 100 mg before b'fast He was on Actos 30 mg daily in a.m. - added in 2016 >> stopped 09/06/2016.  Sugars did not significantly worsen after stopping Actos. Ran out of Januvia 100 mg daily in a.m. >> this was not covered at the end of last year (donut hole).  Pt checks sugars 0-1 X a day: - am:  159-222, 244 >> 176-242 (after coffee) >> 180-low 200s - 2h after b'fast: 101-280 >> 142 >> 155-187 >> n/c - before lunch: 107-215, 259 >> 68-187, 204 >> 173-194 - 2h after lunch:  174, 179 >> 101-225 >> 208, 216 - before dinner: 90-167 >> n/c >> 76-167 >> 214 - 2h after dinner: 123, 168 >> 168 >> n/c - bedtime: n/c - nighttime: n/c Lowest sugar was 68 >> 173;  he has hypoglycemia awareness in the 70s. Highest sugar was 242 >> 333.  Glucometer: One Touch Ultra.  -No  CKD, last BUN/creatinine:  Lab Results  Component Value Date   BUN 16 01/16/2018   BUN 12 12/19/2017   CREATININE 0.92 01/16/2018   CREATININE 0.79 12/19/2017  08/24/2015: 14/1.01, EGFR 73 No results found for: MICRALBCREAT  He has a history of MAU.  Continues on losartan.  -+ HL; last set of lipids: No results found for: CHOL, HDL, LDLCALC, LDLDIRECT, TRIG, CHOLHDL 08/24/2015: 141/134/41/74  On Lipitor.  - last eye exam was 2019: Reportedly no DR, + cataract  -He has numbness and tingling in his feet.   He also has a h/o Melanoma and HTN.  ROS: Constitutional: + weight gain/no weight loss, + fatigue, no subjective hyperthermia, no subjective hypothermia Eyes: no blurry vision, no xerophthalmia ENT: no sore throat, no nodules palpated in throat, no dysphagia, no odynophagia, no hoarseness Cardiovascular: no CP/no SOB/no palpitations/+ leg swelling Respiratory: no cough/no SOB/no wheezing Gastrointestinal: no N/no V/no D/no C/no acid reflux Musculoskeletal: no muscle aches/no joint aches Skin: no rashes, no hair loss Neurological: no tremors/+ numbness/+  tingling/no dizziness  I reviewed pt's medications, allergies, PMH, social hx, family hx, and changes were documented in the history of present illness. Otherwise, unchanged from my initial visit note.   Past Medical History:  Diagnosis Date  . AK (actinic keratosis)   . Arthritis   . BPH (  benign prostatic hyperplasia)   . Chronic low back pain    since 1979  . Diabetes mellitus ORAL MED   type 2  . Elevated PSA   . GERD (gastroesophageal reflux disease)   . Hearing loss    using bilateral hearing aids  . History of radiation therapy 01/11/14- 03/04/14   prostate bed 6600 cGy 33 sessions  . Hypercholesterolemia   . Hypercholesterolemia   . Hyperlipidemia   . Impaired hearing BILATERAL HEARING AIDS   20% RIGHT HEARING DUE TO VIRUS  . Melanoma ([place])   . Normal cardiac stress test 2010  . Prostate cancer ([place])  07/20/11   Gleason 9  . Prostate cancer ([place])   . Prostate nodule   . Skin cancer    malignant melanoma of skin   . Tinnitus    stable since 1992  . Tobacco use    Past Surgical History:  Procedure Laterality Date  . APPENDECTOMY  AGE 72  . CARPOMETACARPAL JOINT ARTHROTOMY  03-22-2006   RIGHT THUMB  . CARPOMETACARPAL JOINT ARTHROTOMY  12-21-2005   LEFT THUMB  . IR GENERIC HISTORICAL  03/22/2016   IR US GUIDE VASC ACCESS RIGHT [facility]  . IR GENERIC HISTORICAL  03/22/2016   IR FLUORO GUIDE PORT INSERTION RIGHT [facility]  . MELANOMA EXCISION  07-12-10   RIGHT NECK  . PROSTATE BIOPSY  07/20/2011   Procedure: BIOPSY TRANSRECTAL ULTRASONIC PROSTATE (TUBP);  Surgeon: Molli Hazard, MD;  Location: [facility];  Service: Urology;  Laterality: N/A;  1 hour requested for this case  Saturation BX  OFFICE TO BRING ULTRASOUND MACHINE    . ROBOT ASSISTED LAPAROSCOPIC RADICAL PROSTATECTOMY  10/19/2011   Procedure: ROBOTIC ASSISTED LAPAROSCOPIC RADICAL PROSTATECTOMY;  Surgeon: Molli Hazard, MD;  Location: [facility];  Service: Urology;  Laterality: N/A;      . SHOULDER OPEN ROTATOR CUFF REPAIR  07-29-2010- RIGHT   AND SUBACROMIAL DECOMPRESSION AROMIOPLASTY  . TONSILLECTOMY  AGE 89   Social History   Social History  . Marital Status: Married    Spouse Name: N/A  . Number of Children: 2   Occupational History  . Retired     Public relations account executive   Social History Main Topics  . Smoking status: Former Smoker -- 1.00 packs/day for 43 years    Types: Cigarettes    Quit date: 12/07/2007  . Smokeless tobacco: Never Used  . Alcohol Use: Yes     Comment: OCCASIONAL  . Drug Use: No   Current Outpatient Medications on File Prior to Visit  Medication Sig Dispense Refill  . atorvastatin (LIPITOR) 10 MG tablet Take 10 mg by mouth daily with breakfast.     . Blood Glucose Monitoring Suppl (ONE TOUCH ULTRA 2) w/Device KIT Use to check blood sugar 2 times per  day. 1 each 2  . calcium carbonate (TUMS - DOSED IN MG ELEMENTAL CALCIUM) 500 MG chewable tablet Chew 1 tablet (200 mg of elemental calcium total) by mouth 2 (two) times daily as needed for indigestion or heartburn. 30 tablet 0  . glipiZIDE (GLUCOTROL XL) 5 MG 24 hr tablet Take 1 tablet (5 mg total) by mouth daily with breakfast. 90 tablet 3  . glucose blood (ONE TOUCH ULTRA TEST) test strip Use to check blood sugar 2 times per day. 200 each 5  . HYDROcodone-acetaminophen (NORCO/VICODIN) 5-325 MG tablet Take 1-2 tablets by mouth every 4 (four) hours as needed. (Patient taking differently: Take 1-2 tablets by mouth every  4 (four) hours as needed for moderate pain or severe pain. ) 90 tablet 0  . hydrOXYzine (ATARAX/VISTARIL) 10 MG tablet TAKE 1 TABLET BY MOUTH EVERY 6 HOURS AS NEEDED (Patient taking differently: TAKE 1 TABLET BY MOUTH EVERY 6 HOURS AS NEEDED FOR ITCHING) 60 tablet 0  . lidocaine-prilocaine (EMLA) cream Apply 1 application topically as needed. Apply to port before chemotherapy. 30 g 0  . loperamide (IMODIUM A-D) 2 MG tablet Take 2 mg by mouth 4 (four) times daily as needed for diarrhea or loose stools.    . metFORMIN (GLUCOPHAGE) 1000 MG tablet Take 1 tablet (1,000 mg total) by mouth 2 (two) times daily with a meal. 180 tablet 3  . Multiple Vitamin (MULTIVITAMIN) tablet Take 1 tablet by mouth daily.    . naproxen sodium (ALEVE) 220 MG tablet Take 440 mg by mouth 2 (two) times daily as needed (pain).    [name] omeprazole (PRILOSEC) 20 MG capsule Take 20 mg by mouth every morning.    [name] DELICA LANCETS 71I MISC Use to check blood sugar 2 times per day. 200 each 2  . prochlorperazine (COMPAZINE) 10 MG tablet Take 1 tablet (10 mg total) by mouth every 6 (six) hours as needed for nausea or vomiting. 30 tablet 0  . pseudoephedrine-acetaminophen (TYLENOL SINUS) 30-500 MG TABS tablet Take 1 tablet by mouth daily as needed (congestion).     . sitaGLIPtin (JANUVIA) 100 MG tablet Take 1 tablet  (100 mg total) by mouth daily. 90 tablet 3   No current facility-administered medications on file prior to visit.    Allergies  Allergen Reactions  . Scallops [Shellfish Allergy] Nausea And Vomiting  . Cephalexin Hives and Rash   Family History  Problem Relation Age of Onset  . Cancer Mother        breast/lived 48 years post  . Alzheimer's disease Mother        deceased  . Cancer Brother        thyroid  . Heart disease Brother   . Stroke Father        68 years old  . Hypertension Father   . Alcoholism Father   . CVA Father   . Cirrhosis Paternal Grandfather        alcohol-related  . Cancer Paternal Grandmother        unknown type    PE: BP 126/62   Pulse 61   Ht _0  (1.778 m) Comment: mesured  Wt 183 lb (83 kg)   SpO2 97%   BMI 26.26 kg/m  Wt Readings from Last 3 Encounters:  01/31/18 183 lb (83 kg)  01/16/18 182 lb 3.2 oz (82.6 kg)  12/19/17 178 lb 14.4 oz (81.1 kg)   Constitutional: overweight, in NAD Eyes: PERRLA, EOMI, no exophthalmos ENT: moist mucous membranes, no thyromegaly, no cervical lymphadenopathy Cardiovascular: RRR, No MRG Respiratory: CTA B Gastrointestinal: abdomen soft, NT, ND, BS+ Musculoskeletal: no deformities, strength intact in all 4 Skin: moist, warm, no rashes Neurological: no tremor with outstretched hands, DTR normal in all 4   ASSESSMENT: 1. DM2, non-insulin-dependent, now more controlled, with complications - PN  2. HL   3. Obesity  PLAN:  1. Patient with long-standing, previously more uncontrolled diabetes, with better control in the last 2 years but with again high sugars after he started chemotherapy with dexamethasone.  We added back Januvia at last visit and discussed about improving his meals.  He was also taking glipizide after breakfast and I advised him  to move this before the meal.  Since last visit, he contacted me with high sugars, but he realized that these were due to drinking Gatorade.  Since then, he stopped  Gatorade and his sugars improved. - sugars are high at this visit, at all times of the day. He refuses to start insulin >> will increase Glipizide to 10 mg in am - Januvia is expensive >> may need to stop when he runs out - I suggested to:  Patient Instructions  Please change from Ensure to Glucerna.  Please continue:  - Januvia 100 mg before b'fast  Please increase: - Glipizide ER to 10 mg before b'fast  Please return in 3 months with your sugar log.    - today, HbA1c is 7.8% (higher) - continue checking sugars at different times of the day - check 1x a day, rotating checks - advised for yearly eye exams >> he is UTD - + Flu shot today - Return to clinic in 4 mo with sugar log    2. HL - Reviewed latest lipid panel from 2017: All fractions at goal. - will try to get the new results from PCP - Continues Lipitor without side effects.   Philemon Kingdom, MD PhD [facility] Endocrinology

## 2018-02-02 ENCOUNTER — Encounter: Payer: Self-pay | Admitting: Internal Medicine

## 2018-02-02 NOTE — Progress Notes (Signed)
Received latest lipid panel from PCP 10/06/2017: 117/85/34/62.

## 2018-02-05 ENCOUNTER — Ambulatory Visit: Payer: PPO | Admitting: Internal Medicine

## 2018-02-13 ENCOUNTER — Inpatient Hospital Stay (HOSPITAL_BASED_OUTPATIENT_CLINIC_OR_DEPARTMENT_OTHER): Payer: PPO | Admitting: Oncology

## 2018-02-13 ENCOUNTER — Inpatient Hospital Stay: Payer: PPO

## 2018-02-13 ENCOUNTER — Inpatient Hospital Stay: Payer: PPO | Attending: Oncology

## 2018-02-13 ENCOUNTER — Telehealth: Payer: Self-pay | Admitting: Oncology

## 2018-02-13 VITALS — BP 139/72 | HR 72 | Temp 98.2°F | Resp 17 | Ht 70.0 in | Wt 183.8 lb

## 2018-02-13 DIAGNOSIS — Z79899 Other long term (current) drug therapy: Secondary | ICD-10-CM | POA: Diagnosis not present

## 2018-02-13 DIAGNOSIS — Z7984 Long term (current) use of oral hypoglycemic drugs: Secondary | ICD-10-CM

## 2018-02-13 DIAGNOSIS — Z5111 Encounter for antineoplastic chemotherapy: Secondary | ICD-10-CM | POA: Diagnosis not present

## 2018-02-13 DIAGNOSIS — G620 Drug-induced polyneuropathy: Secondary | ICD-10-CM | POA: Diagnosis not present

## 2018-02-13 DIAGNOSIS — C786 Secondary malignant neoplasm of retroperitoneum and peritoneum: Secondary | ICD-10-CM | POA: Insufficient documentation

## 2018-02-13 DIAGNOSIS — E119 Type 2 diabetes mellitus without complications: Secondary | ICD-10-CM | POA: Diagnosis not present

## 2018-02-13 DIAGNOSIS — D649 Anemia, unspecified: Secondary | ICD-10-CM | POA: Insufficient documentation

## 2018-02-13 DIAGNOSIS — C61 Malignant neoplasm of prostate: Secondary | ICD-10-CM

## 2018-02-13 DIAGNOSIS — Z95828 Presence of other vascular implants and grafts: Secondary | ICD-10-CM

## 2018-02-13 LAB — CMP (CANCER CENTER ONLY)
ALT: 20 U/L (ref 0–44)
ANION GAP: 11 (ref 5–15)
AST: 16 U/L (ref 15–41)
Albumin: 3.6 g/dL (ref 3.5–5.0)
Alkaline Phosphatase: 57 U/L (ref 38–126)
BUN: 14 mg/dL (ref 8–23)
CALCIUM: 9.3 mg/dL (ref 8.9–10.3)
CO2: 25 mmol/L (ref 22–32)
CREATININE: 0.95 mg/dL (ref 0.61–1.24)
Chloride: 105 mmol/L (ref 98–111)
Glucose, Bld: 261 mg/dL — ABNORMAL HIGH (ref 70–99)
Potassium: 4 mmol/L (ref 3.5–5.1)
SODIUM: 141 mmol/L (ref 135–145)
Total Bilirubin: 0.3 mg/dL (ref 0.3–1.2)
Total Protein: 6.9 g/dL (ref 6.5–8.1)

## 2018-02-13 LAB — CBC WITH DIFFERENTIAL (CANCER CENTER ONLY)
Basophils Absolute: 0 10*3/uL (ref 0.0–0.1)
Basophils Relative: 0 %
EOS ABS: 0.1 10*3/uL (ref 0.0–0.5)
Eosinophils Relative: 2 %
HCT: 33.1 % — ABNORMAL LOW (ref 38.4–49.9)
Hemoglobin: 10.5 g/dL — ABNORMAL LOW (ref 13.0–17.0)
LYMPHS ABS: 0.7 10*3/uL — AB (ref 0.9–3.3)
LYMPHS PCT: 15 %
MCH: 29 pg (ref 27.2–33.4)
MCHC: 31.7 g/dL — AB (ref 32.0–36.0)
MCV: 91.4 fL (ref 79.3–98.0)
MONOS PCT: 11 %
Monocytes Absolute: 0.5 10*3/uL (ref 0.1–0.9)
Neutro Abs: 3.3 10*3/uL (ref 1.5–6.5)
Neutrophils Relative %: 72 %
PLATELETS: 176 10*3/uL (ref 150–400)
RBC: 3.62 MIL/uL — ABNORMAL LOW (ref 4.20–5.82)
RDW: 14.8 % — ABNORMAL HIGH (ref 11.0–14.6)
WBC Count: 4.6 10*3/uL (ref 4.0–10.5)
nRBC: 0 % (ref 0.0–0.2)

## 2018-02-13 MED ORDER — FAMOTIDINE IN NACL 20-0.9 MG/50ML-% IV SOLN
INTRAVENOUS | Status: AC
  Filled 2018-02-13: qty 50

## 2018-02-13 MED ORDER — FAMOTIDINE IN NACL 20-0.9 MG/50ML-% IV SOLN
20.0000 mg | Freq: Once | INTRAVENOUS | Status: AC
  Administered 2018-02-13: 20 mg via INTRAVENOUS

## 2018-02-13 MED ORDER — SODIUM CHLORIDE 0.9 % IV SOLN
Freq: Once | INTRAVENOUS | Status: AC
  Administered 2018-02-13: 10:00:00 via INTRAVENOUS
  Filled 2018-02-13: qty 5

## 2018-02-13 MED ORDER — HEPARIN SOD (PORK) LOCK FLUSH 100 UNIT/ML IV SOLN
500.0000 [IU] | Freq: Once | INTRAVENOUS | Status: AC | PRN
  Administered 2018-02-13: 500 [IU]
  Filled 2018-02-13: qty 5

## 2018-02-13 MED ORDER — SODIUM CHLORIDE 0.9% FLUSH
10.0000 mL | INTRAVENOUS | Status: DC | PRN
  Administered 2018-02-13: 10 mL via INTRAVENOUS
  Filled 2018-02-13: qty 10

## 2018-02-13 MED ORDER — PALONOSETRON HCL INJECTION 0.25 MG/5ML
0.2500 mg | Freq: Once | INTRAVENOUS | Status: AC
  Administered 2018-02-13: 0.25 mg via INTRAVENOUS

## 2018-02-13 MED ORDER — SODIUM CHLORIDE 0.9 % IV SOLN
Freq: Once | INTRAVENOUS | Status: AC
  Administered 2018-02-13: 10:00:00 via INTRAVENOUS
  Filled 2018-02-13: qty 250

## 2018-02-13 MED ORDER — SODIUM CHLORIDE 0.9% FLUSH
10.0000 mL | INTRAVENOUS | Status: DC | PRN
  Administered 2018-02-13: 10 mL
  Filled 2018-02-13: qty 10

## 2018-02-13 MED ORDER — DIPHENHYDRAMINE HCL 50 MG/ML IJ SOLN
INTRAMUSCULAR | Status: AC
  Filled 2018-02-13: qty 1

## 2018-02-13 MED ORDER — DIPHENHYDRAMINE HCL 50 MG/ML IJ SOLN
50.0000 mg | Freq: Once | INTRAMUSCULAR | Status: AC
  Administered 2018-02-13: 50 mg via INTRAVENOUS

## 2018-02-13 MED ORDER — SODIUM CHLORIDE 0.9 % IV SOLN
80.0000 mg/m2 | Freq: Once | INTRAVENOUS | Status: AC
  Administered 2018-02-13: 162 mg via INTRAVENOUS
  Filled 2018-02-13: qty 27

## 2018-02-13 MED ORDER — PALONOSETRON HCL INJECTION 0.25 MG/5ML
INTRAVENOUS | Status: AC
  Filled 2018-02-13: qty 5

## 2018-02-13 MED ORDER — SODIUM CHLORIDE 0.9 % IV SOLN
510.0000 mg | Freq: Once | INTRAVENOUS | Status: AC
  Administered 2018-02-13: 510 mg via INTRAVENOUS
  Filled 2018-02-13: qty 51

## 2018-02-13 NOTE — Progress Notes (Signed)
Hematology and Oncology Follow Up Visit  Frank Larsen [id number] 1946-02-15 72 y.o. 02/13/2018 8:12 AM Frank Larsen, Frank Brow, MD   Principle Diagnosis: 72 year old man with poorly differentiated carcinoma of the prostate with neuroendocrine feature and peritoneal metastasis diagnosed in 2017.  He had prostate adenocarcinoma initially in 2013.   Prior Therapy:  He is status post robotic-assisted laparoscopic radical prostatectomy and extended lymphadenectomy with the pathology reveals T2N0.  completed in June 2013. Taxotere chemotherapy at 75 mg/m every 3 weeks started on 03/30/2016. He is status post 9 cycles of therapy.  He developed relapsed disease and a biopsy proven to show neuroendocrine poorly differentiated tumor in June 2018. He is S/P Carboplatin and etoposide. Cycle 1 given on 10/24/2016. He completed cycle 6 on February 13, 2017 with near complete response.  Current therapy: Carboplatin and paclitaxel cycle 1 given on 10/17/2017.  He is here for cycle 6 of therapy.  Interim History: Frank Larsen returns today for a repeat visit.  Since her last visit, he reports no major changes in his health.  He tolerated the last cycle of chemotherapy without any issues or complaints.  He does have worsening peripheral neuropathy in his lower extremities but not in his fingers.  Stable able to ambulate and drive and attends activities of daily living.  His performance status and quality of life remain excellent.  His appetite is reasonable and his weight is stable.  He does not report any headaches, blurry vision, syncope or seizures.  He denied any alteration in mental status or confusion.  He does not report any fevers, chills, sweats.  He does not report any chest pain, palpitation, orthopnea or leg edema. He does not report any cough, wheezing or hemoptysis. He does not report any constipation or diarrhea.  He  does not report any frequency, urgency or hematuria. He does not report any  arthralgias or myalgias.  He denies any bleeding or clotting tendency.  He denies any skin rashes or lesions.  He denies any lymphadenopathy or petechiae.  Denies any anxiety or depression.  Remaining review of systems is negative.   Medications: I have reviewed the patient's current medications.  Current Outpatient Medications  Medication Sig Dispense Refill  . atorvastatin (LIPITOR) 10 MG tablet Take 10 mg by mouth daily with breakfast.     . Blood Glucose Monitoring Suppl (ONE TOUCH ULTRA 2) w/Device KIT Use to check blood sugar 2 times per day. 1 each 2  . calcium carbonate (TUMS - DOSED IN MG ELEMENTAL CALCIUM) 500 MG chewable tablet Chew 1 tablet (200 mg of elemental calcium total) by mouth 2 (two) times daily as needed for indigestion or heartburn. 30 tablet 0  . glipiZIDE (GLUCOTROL XL) 5 MG 24 hr tablet Take 2 tablets (10 mg total) by mouth daily with breakfast. 180 tablet 3  . glucose blood (ONE TOUCH ULTRA TEST) test strip Use to check blood sugar 2 times per day. 200 each 5  . HYDROcodone-acetaminophen (NORCO/VICODIN) 5-325 MG tablet Take 1-2 tablets by mouth every 4 (four) hours as needed. (Patient taking differently: Take 1-2 tablets by mouth every 4 (four) hours as needed for moderate pain or severe pain. ) 90 tablet 0  . hydrOXYzine (ATARAX/VISTARIL) 10 MG tablet TAKE 1 TABLET BY MOUTH EVERY 6 HOURS AS NEEDED (Patient taking differently: TAKE 1 TABLET BY MOUTH EVERY 6 HOURS AS NEEDED FOR ITCHING) 60 tablet 0  . lidocaine-prilocaine (EMLA) cream Apply 1 application topically as needed. Apply to port before chemotherapy.  30 g 0  . loperamide (IMODIUM A-D) 2 MG tablet Take 2 mg by mouth 4 (four) times daily as needed for diarrhea or loose stools.    . metFORMIN (GLUCOPHAGE) 1000 MG tablet Take 1 tablet (1,000 mg total) by mouth 2 (two) times daily with a meal. (Patient not taking: Reported on 01/31/2018) 180 tablet 3  . Multiple Vitamin (MULTIVITAMIN) tablet Take 1 tablet by mouth daily.     . naproxen sodium (ALEVE) 220 MG tablet Take 440 mg by mouth 2 (two) times daily as needed (pain).    [name] omeprazole (PRILOSEC) 20 MG capsule Take 20 mg by mouth every morning.    [name] DELICA LANCETS 01U MISC Use to check blood sugar 2 times per day. 200 each 2  . prochlorperazine (COMPAZINE) 10 MG tablet Take 1 tablet (10 mg total) by mouth every 6 (six) hours as needed for nausea or vomiting. 30 tablet 0  . pseudoephedrine-acetaminophen (TYLENOL SINUS) 30-500 MG TABS tablet Take 1 tablet by mouth daily as needed (congestion).     . sitaGLIPtin (JANUVIA) 100 MG tablet Take 1 tablet (100 mg total) by mouth daily. 90 tablet 3   No current facility-administered medications for this visit.    Facility-Administered Medications Ordered in Other Visits  Medication Dose Route Frequency Provider Last Rate Last Dose  . sodium chloride flush (NS) 0.9 % injection 10 mL  10 mL Intravenous PRN Wyatt Portela, MD   10 mL at 02/13/18 0803     Allergies:  Allergies  Allergen Reactions  . Scallops [Shellfish Allergy] Nausea And Vomiting  . Cephalexin Hives and Rash    Past Medical History, Surgical history, Social history, and Family History updated and remain unchanged.   Physical Exam:  Blood pressure 139/72, pulse 72, temperature 98.2 F (36.8 C), temperature source Oral, resp. rate 17, height _0  (1.778 m), weight 183 lb 12.8 oz (83.4 kg), SpO2 99 %.   ECOG: 1    General appearance: Alert, awake without any distress. Head: Atraumatic without abnormalities Oropharynx: Without any thrush or ulcers. Eyes: No scleral icterus. Lymph nodes: No lymphadenopathy noted in the cervical, supraclavicular, or axillary nodes Heart:regular rate and rhythm, without any murmurs or gallops.   Lung: Clear to auscultation without any rhonchi, wheezes or dullness to percussion. Abdomin: Soft, nontender without any shifting dullness or ascites. Musculoskeletal: No clubbing or  cyanosis. Neurological: No motor or sensory deficits. Skin: No rashes or lesions. Psychiatric: Mood and affect appeared normal.     Lab Results: Lab Results  Component Value Date   WBC 4.1 01/16/2018   HGB 10.2 (L) 01/16/2018   HCT 31.1 (L) 01/16/2018   MCV 89.9 01/16/2018   PLT 207 01/16/2018     Chemistry      Component Value Date/Time   NA 141 01/16/2018 0826   NA 140 103/31/2018 1136   K 4.0 01/16/2018 0826   K 3.9 103/31/2018 1136   CL 106 01/16/2018 0826   CO2 25 01/16/2018 0826   CO2 25 103/31/2018 1136   BUN 16 01/16/2018 0826   BUN 15.8 103/31/2018 1136   CREATININE 0.92 01/16/2018 0826   CREATININE 0.9 103/31/2018 1136   GLU 155 08/24/2015      Component Value Date/Time   CALCIUM 9.1 01/16/2018 0826   CALCIUM 9.2 103/31/2018 1136   ALKPHOS 66 01/16/2018 0826   ALKPHOS 55 103/31/2018 1136   AST 17 01/16/2018 0826   AST 19 103/31/2018 1136   ALT 20 01/16/2018 0826  ALT 15 1Mar 08, 202018 1136   BILITOT 0.4 01/16/2018 0826   BILITOT 0.32 1Mar 08, 202018 1136         Impression and Plan:   72 year old man with:  1. Poorly differentiated prostate cancer with neuroendocrine feature diagnosed in 2017.  He has peritoneal metastasis predominantly.  [name]  He has tolerated chemotherapy reasonably well and completed 5 cycles of carboplatin and paclitaxel.  The natural course of this disease as well as future treatment options were reviewed today.  I have recommended proceeding with cycle 6 of therapy without any dose reduction or delay.  Upon completing the cycle will have a repeat imaging studies for staging purposes.  2.  Abdominal pain: Resolved at this time after starting chemotherapy.   3. IV access: Port-A-Cath is in use without any complications.     4. Anemia: Hemoglobin remains stable and related to chemotherapy.  5.  Neutropenia prophylaxis: Growth factor support has been on hold at this time because of poor tolerance.  6.  Prognosis: His performance status  remain excellent and aggressive therapy is warranted.  His disease is incurable.  7.  Neuropathy: Related to Taxol chemotherapy.  I will reduce his dose for the last cycle of chemotherapy at this time.  8. Follow-up: Will be in 4 weeks to follow his progress and repeat CT scan.  25  minutes was spent with the patient face-to-face today.  More than 50% of time was dedicated to reviewing his disease status, treatment plan and coordinating plan of care.  Zola Button, MD 10/8/20198:12 AM

## 2018-02-13 NOTE — Telephone Encounter (Signed)
Appts scheduled avs/calendar printed per 10/8 los °

## 2018-02-13 NOTE — Patient Instructions (Signed)
[facility] Discharge Instructions for Patients Receiving Chemotherapy  Today you received the following chemotherapy agents: Paclitaxel (Taxol) and Carboplatin (Paraplatin)  To help prevent nausea and vomiting after your treatment, we encourage you to take your nausea medication as directed.    If you develop nausea and vomiting that is not controlled by your nausea medication, call the clinic.   BELOW ARE SYMPTOMS THAT SHOULD BE REPORTED IMMEDIATELY:  *FEVER GREATER THAN 100.5 F  *CHILLS WITH OR WITHOUT FEVER  NAUSEA AND VOMITING THAT IS NOT CONTROLLED WITH YOUR NAUSEA MEDICATION  *UNUSUAL SHORTNESS OF BREATH  *UNUSUAL BRUISING OR BLEEDING  TENDERNESS IN MOUTH AND THROAT WITH OR WITHOUT PRESENCE OF ULCERS  *URINARY PROBLEMS  *BOWEL PROBLEMS  UNUSUAL RASH Items with * indicate a potential emergency and should be followed up as soon as possible.  Feel free to call the clinic should you have any questions or concerns. The clinic phone number is [phone].  Please show the CHEMO ALERT CARD at check-in to the Emergency Department and triage nurse.   

## 2018-02-15 ENCOUNTER — Ambulatory Visit: Payer: PPO

## 2018-02-20 ENCOUNTER — Other Ambulatory Visit: Payer: Self-pay

## 2018-02-20 MED ORDER — HYDROCODONE-ACETAMINOPHEN 5-325 MG PO TABS: 1.0000 | ORAL_TABLET | ORAL | 0 refills | Status: DC | PRN

## 2018-03-13 ENCOUNTER — Ambulatory Visit (HOSPITAL_COMMUNITY)
Admission: RE | Admit: 2018-03-13 | Discharge: 2018-03-13 | Disposition: A | Discharge status: Home / Self Care | Payer: PPO | Source: Ambulatory Visit | Attending: Oncology | Admitting: Oncology

## 2018-03-13 ENCOUNTER — Encounter (HOSPITAL_COMMUNITY): Payer: Self-pay

## 2018-03-13 ENCOUNTER — Inpatient Hospital Stay: Payer: PPO

## 2018-03-13 ENCOUNTER — Inpatient Hospital Stay: Payer: PPO | Attending: Oncology

## 2018-03-13 DIAGNOSIS — C61 Malignant neoplasm of prostate: Secondary | ICD-10-CM | POA: Insufficient documentation

## 2018-03-13 DIAGNOSIS — Z95828 Presence of other vascular implants and grafts: Secondary | ICD-10-CM

## 2018-03-13 DIAGNOSIS — D649 Anemia, unspecified: Secondary | ICD-10-CM | POA: Insufficient documentation

## 2018-03-13 DIAGNOSIS — C7951 Secondary malignant neoplasm of bone: Secondary | ICD-10-CM | POA: Diagnosis not present

## 2018-03-13 DIAGNOSIS — C786 Secondary malignant neoplasm of retroperitoneum and peritoneum: Secondary | ICD-10-CM | POA: Diagnosis not present

## 2018-03-13 DIAGNOSIS — R918 Other nonspecific abnormal finding of lung field: Secondary | ICD-10-CM | POA: Insufficient documentation

## 2018-03-13 DIAGNOSIS — Z79899 Other long term (current) drug therapy: Secondary | ICD-10-CM | POA: Diagnosis not present

## 2018-03-13 DIAGNOSIS — Z9221 Personal history of antineoplastic chemotherapy: Secondary | ICD-10-CM | POA: Insufficient documentation

## 2018-03-13 LAB — CBC WITH DIFFERENTIAL (CANCER CENTER ONLY)
Abs Immature Granulocytes: 0.02 10*3/uL (ref 0.00–0.07)
Basophils Absolute: 0 10*3/uL (ref 0.0–0.1)
Basophils Relative: 1 %
EOS ABS: 0.1 10*3/uL (ref 0.0–0.5)
EOS PCT: 2 %
HCT: 33.8 % — ABNORMAL LOW (ref 39.0–52.0)
HEMOGLOBIN: 10.9 g/dL — AB (ref 13.0–17.0)
Immature Granulocytes: 1 %
LYMPHS ABS: 0.8 10*3/uL (ref 0.7–4.0)
Lymphocytes Relative: 20 %
MCH: 29.1 pg (ref 26.0–34.0)
MCHC: 32.2 g/dL (ref 30.0–36.0)
MCV: 90.1 fL (ref 80.0–100.0)
MONO ABS: 0.7 10*3/uL (ref 0.1–1.0)
Monocytes Relative: 17 %
NRBC: 0 % (ref 0.0–0.2)
Neutro Abs: 2.4 10*3/uL (ref 1.7–7.7)
Neutrophils Relative %: 59 %
Platelet Count: 166 10*3/uL (ref 150–400)
RBC: 3.75 MIL/uL — AB (ref 4.22–5.81)
RDW: 14.6 % (ref 11.5–15.5)
WBC: 4 10*3/uL (ref 4.0–10.5)

## 2018-03-13 LAB — CMP (CANCER CENTER ONLY)
ALK PHOS: 58 U/L (ref 38–126)
ALT: 14 U/L (ref 0–44)
ANION GAP: 10 (ref 5–15)
AST: 15 U/L (ref 15–41)
Albumin: 3.7 g/dL (ref 3.5–5.0)
BUN: 21 mg/dL (ref 8–23)
CO2: 26 mmol/L (ref 22–32)
Calcium: 9.3 mg/dL (ref 8.9–10.3)
Chloride: 104 mmol/L (ref 98–111)
Creatinine: 1.1 mg/dL (ref 0.61–1.24)
GFR, Estimated: >60 mL/min (ref >60)
Glucose, Bld: 191 mg/dL — ABNORMAL HIGH (ref 70–99)
Potassium: 4 mmol/L (ref 3.5–5.1)
Sodium: 140 mmol/L (ref 135–145)
TOTAL PROTEIN: 7.3 g/dL (ref 6.5–8.1)

## 2018-03-13 MED ORDER — SODIUM CHLORIDE 0.9 % IJ SOLN
INTRAMUSCULAR | Status: AC
  Filled 2018-03-13: qty 50

## 2018-03-13 MED ORDER — HEPARIN SOD (PORK) LOCK FLUSH 100 UNIT/ML IV SOLN
500.0000 [IU] | Freq: Once | INTRAVENOUS | Status: AC
  Administered 2018-03-13: 500 [IU] via INTRAVENOUS

## 2018-03-13 MED ORDER — SODIUM CHLORIDE 0.9% FLUSH
10.0000 mL | INTRAVENOUS | Status: DC | PRN
  Administered 2018-03-13: 10 mL via INTRAVENOUS
  Filled 2018-03-13: qty 10

## 2018-03-13 MED ORDER — HEPARIN SOD (PORK) LOCK FLUSH 100 UNIT/ML IV SOLN
INTRAVENOUS | Status: AC
  Administered 2018-03-13: 500 [IU] via INTRAVENOUS
  Filled 2018-03-13: qty 5

## 2018-03-13 MED ORDER — IOHEXOL 300 MG/ML  SOLN
100.0000 mL | Freq: Once | INTRAMUSCULAR | Status: AC | PRN
  Administered 2018-03-13: 100 mL via INTRAVENOUS

## 2018-03-14 ENCOUNTER — Inpatient Hospital Stay (HOSPITAL_BASED_OUTPATIENT_CLINIC_OR_DEPARTMENT_OTHER): Payer: PPO | Admitting: Oncology

## 2018-03-14 ENCOUNTER — Telehealth: Payer: Self-pay

## 2018-03-14 VITALS — BP 137/65 | HR 74 | Temp 98.1°F | Resp 17 | Ht 70.0 in | Wt 184.9 lb

## 2018-03-14 DIAGNOSIS — C786 Secondary malignant neoplasm of retroperitoneum and peritoneum: Secondary | ICD-10-CM

## 2018-03-14 DIAGNOSIS — C7951 Secondary malignant neoplasm of bone: Secondary | ICD-10-CM | POA: Diagnosis not present

## 2018-03-14 DIAGNOSIS — D649 Anemia, unspecified: Secondary | ICD-10-CM | POA: Diagnosis not present

## 2018-03-14 DIAGNOSIS — C61 Malignant neoplasm of prostate: Secondary | ICD-10-CM

## 2018-03-14 DIAGNOSIS — Z79899 Other long term (current) drug therapy: Secondary | ICD-10-CM | POA: Diagnosis not present

## 2018-03-14 DIAGNOSIS — R918 Other nonspecific abnormal finding of lung field: Secondary | ICD-10-CM

## 2018-03-14 DIAGNOSIS — Z9221 Personal history of antineoplastic chemotherapy: Secondary | ICD-10-CM

## 2018-03-14 NOTE — Progress Notes (Signed)
Hematology and Oncology Follow Up Visit  Frank Larsen [id number] December 28, 1945 72 y.o. 03/14/2018 1:21 PM Frank Larsen, [name], MD   Principle Diagnosis: 72 year old man with prostate cancer diagnosed in 2013 he subsequently developed stage IV disease with poorly differentiated carcinoma peritoneal involvement.  Prior Therapy:  He is status post robotic-assisted laparoscopic radical prostatectomy and extended lymphadenectomy with the pathology reveals T2N0.  completed in June 2013. Taxotere chemotherapy at 75 mg/m every 3 weeks started on 03/30/2016. He is status post 9 cycles of therapy.  He developed relapsed disease and a biopsy proven to show neuroendocrine poorly differentiated tumor in June 2018. He is S/P Carboplatin and etoposide. Cycle 1 given on 10/24/2016. He completed cycle 6 on February 13, 2017 with near complete response.  Carboplatin and paclitaxel cycle 1 given on 10/17/2017.  He completed 6 cycles of therapy in October 2019.   Current therapy: Active surveillance.  Interim History: Mr. [name] is here for a repeat evaluation.  Since the last visit, he reports no major complaints.  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  He is able to ambulate and drive without any difficulties.  He continues to attend to activities of daily living without any decline in ability to do so.  He denies any abdominal distention or worsening diarrhea.  He denies any decline in his performance status and quality of life.  He does not report any headaches, blurry vision, syncope or seizures.  He denied any dizziness or lethargy.  He does not report any fevers, chills, sweats.  He does not report any chest pain, palpitation, orthopnea or leg edema. He does not report any cough, wheezing or hemoptysis. He does not report any changes in bowel habits.  He  does not report any frequency, urgency or hematuria. He does not report any bone pain or pathological fractures.  He  denies any adenopathy or bleeding.  He denies any skin rashes or lesions.  He denies any changes in his mood.  Remaining review of systems is negative.   Medications: I have reviewed the patient's current medications.  Current Outpatient Medications  Medication Sig Dispense Refill  . atorvastatin (LIPITOR) 10 MG tablet Take 10 mg by mouth daily with breakfast.     . Blood Glucose Monitoring Suppl (ONE TOUCH ULTRA 2) w/Device KIT Use to check blood sugar 2 times per day. 1 each 2  . calcium carbonate (TUMS - DOSED IN MG ELEMENTAL CALCIUM) 500 MG chewable tablet Chew 1 tablet (200 mg of elemental calcium total) by mouth 2 (two) times daily as needed for indigestion or heartburn. 30 tablet 0  . glipiZIDE (GLUCOTROL XL) 5 MG 24 hr tablet Take 2 tablets (10 mg total) by mouth daily with breakfast. 180 tablet 3  . glucose blood (ONE TOUCH ULTRA TEST) test strip Use to check blood sugar 2 times per day. 200 each 5  . HYDROcodone-acetaminophen (NORCO/VICODIN) 5-325 MG tablet Take 1-2 tablets by mouth every 4 (four) hours as needed. 90 tablet 0  . hydrOXYzine (ATARAX/VISTARIL) 10 MG tablet TAKE 1 TABLET BY MOUTH EVERY 6 HOURS AS NEEDED (Patient taking differently: TAKE 1 TABLET BY MOUTH EVERY 6 HOURS AS NEEDED FOR ITCHING) 60 tablet 0  . lidocaine-prilocaine (EMLA) cream Apply 1 application topically as needed. Apply to port before chemotherapy. 30 g 0  . loperamide (IMODIUM A-D) 2 MG tablet Take 2 mg by mouth 4 (four) times daily as needed for diarrhea or loose stools.    . Multiple  Vitamin (MULTIVITAMIN) tablet Take 1 tablet by mouth daily.    . naproxen sodium (ALEVE) 220 MG tablet Take 440 mg by mouth 2 (two) times daily as needed (pain).    [name] omeprazole (PRILOSEC) 20 MG capsule Take 20 mg by mouth every morning.    [name] DELICA LANCETS 00F MISC Use to check blood sugar 2 times per day. 200 each 2  . prochlorperazine (COMPAZINE) 10 MG tablet Take 1 tablet (10 mg total) by mouth every 6 (six) hours  as needed for nausea or vomiting. 30 tablet 0  . pseudoephedrine-acetaminophen (TYLENOL SINUS) 30-500 MG TABS tablet Take 1 tablet by mouth daily as needed (congestion).     . sitaGLIPtin (JANUVIA) 100 MG tablet Take 1 tablet (100 mg total) by mouth daily. 90 tablet 3   No current facility-administered medications for this visit.      Allergies:  Allergies  Allergen Reactions  . Scallops [Shellfish Allergy] Nausea And Vomiting  . Cephalexin Hives and Rash    Past Medical History, Surgical history, Social history, and Family History updated and remain unchanged.   Physical Exam:  Blood pressure 137/65, pulse 74, temperature 98.1 F (36.7 C), temperature source Oral, resp. rate 17, height '5\' 10"'  (1.778 m), weight 184 lb 14.4 oz (83.9 kg), SpO2 97 %.   ECOG: 1    General appearance: Comfortable appearing without any discomfort Head: Normocephalic without any trauma Oropharynx: Mucous membranes are moist and pink without any thrush or ulcers. Eyes: Pupils are equal and round reactive to light. Lymph nodes: No cervical, supraclavicular, inguinal or axillary lymphadenopathy.   Heart:regular rate and rhythm.  S1 and S2 without leg edema. Lung: Clear without any rhonchi or wheezes.  No dullness to percussion. Abdomin: Soft, nontender, nondistended with good bowel sounds.  No hepatosplenomegaly. Musculoskeletal: No joint deformity or effusion.  Full range of motion noted. Neurological: No deficits noted on motor, sensory and deep tendon reflex exam. Skin: No petechial rash or dryness.  Appeared moist.  Psychiatric: Mood and affect appeared appropriate.       Lab Results: Lab Results  Component Value Date   WBC 4.0 03/13/2018   HGB 10.9 (L) 03/13/2018   HCT 33.8 (L) 03/13/2018   MCV 90.1 03/13/2018   PLT 166 03/13/2018     Chemistry      Component Value Date/Time   NA 140 03/13/2018 0839   NA 140 12020/10/1116 1136   K 4.0 03/13/2018 0839   K 3.9 12020/10/1116 1136   CL  104 03/13/2018 0839   CO2 26 03/13/2018 0839   CO2 25 12020/10/1116 1136   BUN 21 03/13/2018 0839   BUN 15.8 12020/10/1116 1136   CREATININE 1.10 03/13/2018 0839   CREATININE 0.9 12020/10/1116 1136   GLU 155 08/24/2015      Component Value Date/Time   CALCIUM 9.3 03/13/2018 0839   CALCIUM 9.2 12020/10/1116 1136   ALKPHOS 58 03/13/2018 0839   ALKPHOS 55 12020/10/1116 1136   AST 15 03/13/2018 0839   AST 19 12020/10/1116 1136   ALT 14 03/13/2018 0839   ALT 15 12020/10/1116 1136   BILITOT <0.2 (L) 03/13/2018 0839   BILITOT 0.32 12020/10/1116 1136     CLINICAL DATA:  History of metastatic prostate cancer. Follow-up exam.  EXAM: CT CHEST, ABDOMEN, AND PELVIS WITH CONTRAST  TECHNIQUE: Multidetector CT imaging of the chest, abdomen and pelvis was performed following the standard protocol during bolus administration of intravenous contrast.  CONTRAST:  124m OMNIPAQUE IOHEXOL 300 MG/ML  SOLN  COMPARISON:  CT CAP 10/09/2017  FINDINGS: CT CHEST FINDINGS  Cardiovascular: Right anterior chest wall Port-A-Cath is present with tip terminating in the superior vena cava. Normal heart size. Coronary arterial vascular calcifications. Thoracic aortic vascular calcifications.  Mediastinum/Nodes: No enlarged axillary, mediastinal or hilar lymphadenopathy. Normal appearance of the esophagus. Interval decrease in size of 11 mm pericardiophrenic lymph node (image 45; series 2), previously 1.6 cm.  Lungs/Pleura: Central airways are patent. Dependent atelectasis within the bilateral lower lobes. Unchanged small nodularity along the fissures within the right lung. Persistent small right pleural effusion. No pneumothorax.  Musculoskeletal: Thoracic spine degenerative changes. Similar patchy sclerosis involving the lateral right eighth rib (image 97; series 6).  CT ABDOMEN PELVIS FINDINGS  Hepatobiliary: Unchanged 1 cm low-attenuation lesion left hepatic lobe (image 53; series 2). No new or  enlarging lesions identified. Gallbladder is unremarkable. No intrahepatic or extrahepatic biliary ductal dilatation.  Pancreas: Unremarkable  Spleen: Unremarkable  Adrenals/Urinary Tract: Normal adrenal glands. Kidneys enhance symmetrically with contrast. No hydronephrosis. Urinary bladder is decompressed and thick-walled.  Stomach/Bowel: No abnormal bowel wall thickening or evidence for bowel obstruction. Oral contrast material throughout the colon.  Vascular/Lymphatic: Normal caliber abdominal aorta. Peripheral calcified atherosclerotic plaque. Interval decrease in size of suspected right periaortic lymph node measuring 0.9 cm (image 61; series 2), previously 1.4 cm.  Reproductive: Prior prostatectomy. Stable appearance of the surgical bed.  Other: Interval decrease in now trace ascites. Interval decrease in size of previously described nodularity within the omentum and about the colon. Reference pericolonic nodule measures 0.6 cm (image 83; series 2). Nodule in this region on prior exam measured 1.5 cm. Exact comparison is difficult given shifts in the bowel content and decreased ascites.  Musculoskeletal: Unchanged sclerosis within the right ilium (image 90; series 2). Lumbar spine degenerative changes.  IMPRESSION: 1. Interval decrease in now trace volume ascites within the abdomen with decreasing peritoneal nodularity. 2. Interval decrease in size of right periaortic lymph node. 3. Similar sclerotic osseous metastatic disease. 4. Marked urinary bladder wall thickening which may represent cystitis.    Impression and Plan:   72 year old man with:  1.  Prostate cancer with neuroendocrine feature with metastatic disease to the peritoneum diagnosed in 2017. [name]  He completed 6 cycles of chemotherapy without any major complications.  CT scan obtained on March 13, 2018 was personally reviewed today and showed positive response to therapy with near resolution  of his disease.  The natural course of his disease was reviewed today in detail and options of therapy were discussed.  At this time, I have recommended continued observation and surveillance and restart systemic therapy in the future if needed.  He understands he is at risk of developing recurrent disease in the near future and salvage therapy may be needed at that time.  2.  Abdominal pain: Resolved at this time after starting chemotherapy.   3. IV access: Port-A-Cath is in use without any complications.  She will be flushed every 6 weeks.     4. Anemia: Hemoglobin continues to improve off chemotherapy.  5.  Prognosis: His disease remains incurable but his performance status remains excellent and aggressive therapy is warranted.  6.  Neuropathy: Sensory in nature in his lower extremities and continues to be at a lower grade.  7. Follow-up: Will be in 6 weeks for Port-A-Cath flush and in 3 months for repeat CT scan.  15  minutes was spent with the patient face-to-face today.  More than 50% of time was dedicated to  discussing imaging studies, laboratory data and coordinating plan of care.  Zola Button, MD 11/6/20191:21 PM

## 2018-03-14 NOTE — Telephone Encounter (Signed)
Printed avs and calender of upcoming appointment. Per 1/16 los 

## 2018-03-20 ENCOUNTER — Other Ambulatory Visit: Payer: Self-pay | Admitting: Oncology

## 2018-04-09 DIAGNOSIS — J029 Acute pharyngitis, unspecified: Secondary | ICD-10-CM | POA: Diagnosis not present

## 2018-04-10 ENCOUNTER — Telehealth: Payer: Self-pay | Admitting: *Deleted

## 2018-04-10 NOTE — Telephone Encounter (Signed)
"  Frank Larsen wife [name] [phone]) calling to ask what we need to do about his sore throat.  Went to [facility].  Dr. York Cerise said this is a side effect of chemotherapy, prescribed MMW (Nystatin/Lidocaine/Benadryl) and Cepacol lozengers he started Sunday but they're not helping.  Today I was able to get him to try an Ensure.  We're surprised as his last chemotherapy was October 8th."    Routing call information to collaborative and provider for review for any further communication or orders.   This nurse shared onset of mucositis, mouth care with alcohol free oral hygiene products like Biotene, eat soft foods using plastic utensils, drink using straws.  Denies further needs or questions at this time.

## 2018-04-12 DIAGNOSIS — C786 Secondary malignant neoplasm of retroperitoneum and peritoneum: Secondary | ICD-10-CM | POA: Diagnosis not present

## 2018-04-12 DIAGNOSIS — I10 Essential (primary) hypertension: Secondary | ICD-10-CM | POA: Diagnosis not present

## 2018-04-12 DIAGNOSIS — R609 Edema, unspecified: Secondary | ICD-10-CM | POA: Diagnosis not present

## 2018-04-12 DIAGNOSIS — J029 Acute pharyngitis, unspecified: Secondary | ICD-10-CM | POA: Diagnosis not present

## 2018-04-12 DIAGNOSIS — C61 Malignant neoplasm of prostate: Secondary | ICD-10-CM | POA: Diagnosis not present

## 2018-04-12 DIAGNOSIS — E1165 Type 2 diabetes mellitus with hyperglycemia: Secondary | ICD-10-CM | POA: Diagnosis not present

## 2018-04-12 DIAGNOSIS — R1084 Generalized abdominal pain: Secondary | ICD-10-CM | POA: Diagnosis not present

## 2018-04-12 DIAGNOSIS — E782 Mixed hyperlipidemia: Secondary | ICD-10-CM | POA: Diagnosis not present

## 2018-04-12 DIAGNOSIS — D649 Anemia, unspecified: Secondary | ICD-10-CM | POA: Diagnosis not present

## 2018-04-12 DIAGNOSIS — K219 Gastro-esophageal reflux disease without esophagitis: Secondary | ICD-10-CM | POA: Diagnosis not present

## 2018-04-14 ENCOUNTER — Other Ambulatory Visit: Payer: Self-pay | Admitting: Oncology

## 2018-04-16 ENCOUNTER — Other Ambulatory Visit: Payer: Self-pay

## 2018-04-16 ENCOUNTER — Emergency Department (HOSPITAL_COMMUNITY)
Admission: EM | Admit: 2018-04-16 | Discharge: 2018-04-16 | Disposition: A | Discharge status: Home / Self Care | Payer: PPO | Attending: Emergency Medicine | Admitting: Emergency Medicine

## 2018-04-16 ENCOUNTER — Encounter: Payer: Self-pay | Admitting: Oncology

## 2018-04-16 ENCOUNTER — Encounter (HOSPITAL_COMMUNITY): Payer: Self-pay

## 2018-04-16 ENCOUNTER — Emergency Department (HOSPITAL_COMMUNITY): Payer: PPO

## 2018-04-16 DIAGNOSIS — R109 Unspecified abdominal pain: Secondary | ICD-10-CM | POA: Insufficient documentation

## 2018-04-16 DIAGNOSIS — K7689 Other specified diseases of liver: Secondary | ICD-10-CM | POA: Insufficient documentation

## 2018-04-16 DIAGNOSIS — K769 Liver disease, unspecified: Secondary | ICD-10-CM | POA: Diagnosis not present

## 2018-04-16 DIAGNOSIS — R188 Other ascites: Secondary | ICD-10-CM | POA: Diagnosis not present

## 2018-04-16 DIAGNOSIS — Z87891 Personal history of nicotine dependence: Secondary | ICD-10-CM | POA: Insufficient documentation

## 2018-04-16 DIAGNOSIS — Z79899 Other long term (current) drug therapy: Secondary | ICD-10-CM | POA: Diagnosis not present

## 2018-04-16 LAB — COMPREHENSIVE METABOLIC PANEL
ALT: 19 U/L (ref 0–44)
AST: 23 U/L (ref 15–41)
Albumin: 3.6 g/dL (ref 3.5–5.0)
Alkaline Phosphatase: 46 U/L (ref 38–126)
Anion gap: 12 (ref 5–15)
BUN: 26 mg/dL — ABNORMAL HIGH (ref 8–23)
CO2: 23 mmol/L (ref 22–32)
Calcium: 8.9 mg/dL (ref 8.9–10.3)
Chloride: 105 mmol/L (ref 98–111)
Creatinine, Ser: 0.88 mg/dL (ref 0.61–1.24)
GFR calc Af Amer: >60 mL/min (ref >60)
GFR calc non Af Amer: >60 mL/min (ref >60)
Glucose, Bld: 159 mg/dL — ABNORMAL HIGH (ref 70–99)
Potassium: 3.8 mmol/L (ref 3.5–5.1)
Sodium: 140 mmol/L (ref 135–145)
Total Bilirubin: 0.5 mg/dL (ref 0.3–1.2)
Total Protein: 7.1 g/dL (ref 6.5–8.1)

## 2018-04-16 LAB — URINALYSIS, ROUTINE W REFLEX MICROSCOPIC
Bilirubin Urine: NEGATIVE
GLUCOSE, UA: NEGATIVE mg/dL
Hgb urine dipstick: NEGATIVE
KETONES UR: NEGATIVE mg/dL
LEUKOCYTES UA: NEGATIVE
NITRITE: NEGATIVE
PROTEIN: NEGATIVE mg/dL
Specific Gravity, Urine: 1.027 (ref 1.005–1.030)
pH: 5 (ref 5.0–8.0)

## 2018-04-16 LAB — CBC
HCT: 33 % — ABNORMAL LOW (ref 39.0–52.0)
Hemoglobin: 10.3 g/dL — ABNORMAL LOW (ref 13.0–17.0)
MCH: 28.5 pg (ref 26.0–34.0)
MCHC: 31.2 g/dL (ref 30.0–36.0)
MCV: 91.2 fL (ref 80.0–100.0)
Platelets: 300 10*3/uL (ref 150–400)
RBC: 3.62 MIL/uL — ABNORMAL LOW (ref 4.22–5.81)
RDW: 13.9 % (ref 11.5–15.5)
WBC: 5.4 10*3/uL (ref 4.0–10.5)
nRBC: 0 % (ref 0.0–0.2)

## 2018-04-16 LAB — LIPASE, BLOOD: Lipase: 29 U/L (ref 11–51)

## 2018-04-16 MED ORDER — IOPAMIDOL (ISOVUE-300) INJECTION 61%
INTRAVENOUS | Status: AC
  Filled 2018-04-16: qty 100

## 2018-04-16 MED ORDER — IOPAMIDOL (ISOVUE-300) INJECTION 61%
100.0000 mL | Freq: Once | INTRAVENOUS | Status: AC | PRN
  Administered 2018-04-16: 100 mL via INTRAVENOUS

## 2018-04-16 MED ORDER — SODIUM CHLORIDE (PF) 0.9 % IJ SOLN
INTRAMUSCULAR | Status: AC
  Filled 2018-04-16: qty 50

## 2018-04-16 MED ORDER — HEPARIN SOD (PORK) LOCK FLUSH 100 UNIT/ML IV SOLN
500.0000 [IU] | Freq: Once | INTRAVENOUS | Status: AC
  Administered 2018-04-16: 500 [IU]
  Filled 2018-04-16: qty 5

## 2018-04-16 NOTE — Discharge Instructions (Signed)
Dr. Hazeline Junker office will contact you in the next 24 hours to make an appointment for follow-up.  You may continue taking your Norco prescription as needed for pain.  If your pain is not under control or you are unable to eat please return to the emergency department.  If you develop any new or worsening symptoms in the meantime please not hesitate to return to the emergency department for reevaluation.

## 2018-04-16 NOTE — ED Provider Notes (Signed)
[facility]-EMERGENCY DEPT Provider Note   CSN: [id number] Arrival date & time: 04/16/18  1028     History   Chief Complaint Chief Complaint  Patient presents with  . Abdominal Pain    HPI Frank Larsen is a 72 y.o. male.  HPI   Pt is a 72 y/o male with a h /o prostate CA with mets to the omentum (radiation 2 years ago, last chemo 02/11/18), DM, HTN, HLD, who presents to the ED today c/o abd pain that began about 1 month ago. Pain has been worsening since onset. Pain feels like an aching and sharp pain. Pain improves when laying down/sitting and feels like a 1/10. When he stands up and walks he rates pain at 5/10.   He denies nausea, vomiting, diarrhea. He reports some intermittent constipation. Last BM was 2 days ago. Denies fevers, dysuria, urgency, hematuria. No bloody stools. Has chronic urinary frequency that is unchanged.   Was recently dx with sinusisitis and tx with magic mouthwash. Denies recent abx use..   Planned for f/u CT 06/12/17 however pain worse today.  Oncologist: Dr. Shadad  Past Medical History:  Diagnosis Date  . AK (actinic keratosis)   . Arthritis   . BPH (benign prostatic hyperplasia)   . Chronic low back pain    since 1979  . Diabetes mellitus ORAL MED   type 2  . Elevated PSA   . GERD (gastroesophageal reflux disease)   . Hearing loss    using bilateral hearing aids  . History of radiation therapy 01/11/14- 03/04/14   prostate bed 6600 cGy 33 sessions  . Hypercholesterolemia   . Hypercholesterolemia   . Hyperlipidemia   . Impaired hearing BILATERAL HEARING AIDS   20% RIGHT HEARING DUE TO VIRUS  . Melanoma (HCC)   . Normal cardiac stress test 2010  . Prostate cancer (HCC) 07/20/11   Gleason 9  . Prostate cancer (HCC)   . Prostate nodule   . Skin cancer    malignant melanoma of skin   . Tinnitus    stable since 1992  . Tobacco use     Patient Active Problem List   Diagnosis Date Noted  . Protein-calorie  malnutrition, severe 10/25/2017  . Hypokalemia 10/24/2017  . Hypomagnesemia 10/24/2017  . Acute kidney injury superimposed on CKD (HCC) 10/24/2017  . Ileus (HCC) 10/24/2017  . Pancytopenia (HCC) 10/24/2017  . Diabetes mellitus type II, uncontrolled (HCC) 10/24/2017  . Antineoplastic chemotherapy induced anemia 10/24/2017  . Colitis 10/23/2017  . Goals of care, counseling/discussion 11/16/2016  . Port catheter in place 03/30/2016  . Controlled type 2 diabetes mellitus with neuropathy (HCC) 10/29/2015  . Hypercholesterolemia   . BPH (benign prostatic hyperplasia)   . Prostate CA (HCC) 08/17/2011    Past Surgical History:  Procedure Laterality Date  . APPENDECTOMY  AGE 16  . CARPOMETACARPAL JOINT ARTHROTOMY  03-22-2006   RIGHT THUMB  . CARPOMETACARPAL JOINT ARTHROTOMY  12-21-2005   LEFT THUMB  . IR GENERIC HISTORICAL  03/22/2016   IR US GUIDE VASC ACCESS RIGHT [facility]  . IR GENERIC HISTORICAL  03/22/2016   IR FLUORO GUIDE PORT INSERTION RIGHT [facility]  . MELANOMA EXCISION  07-12-10   RIGHT NECK  . PROSTATE BIOPSY  07/20/2011   Procedure: BIOPSY TRANSRECTAL ULTRASONIC PROSTATE (TUBP);  Surgeon: Daniel Young Woodruff, MD;  Location: [facility];  Service: Urology;  Laterality: N/A;  1 hour requested for this case  Saturation BX  OFFICE TO   BRING ULTRASOUND MACHINE    . ROBOT ASSISTED LAPAROSCOPIC RADICAL PROSTATECTOMY  10/19/2011   Procedure: ROBOTIC ASSISTED LAPAROSCOPIC RADICAL PROSTATECTOMY;  Surgeon: Daniel Young Woodruff, MD;  Location: [facility];  Service: Urology;  Laterality: N/A;      . SHOULDER OPEN ROTATOR CUFF REPAIR  07-29-2010- RIGHT   AND SUBACROMIAL DECOMPRESSION AROMIOPLASTY  . TONSILLECTOMY  AGE 14        Home Medications    Prior to Admission medications   Medication Sig Start Date End Date Taking? Authorizing Provider  atorvastatin (LIPITOR) 10 MG tablet Take 10 mg by mouth daily with breakfast.    Yes [provider]    glipiZIDE (GLUCOTROL XL) 5 MG 24 hr tablet Take 2 tablets (10 mg total) by mouth daily with breakfast. 01/31/18  Yes Gherghe, Cristina, MD  HYDROcodone-acetaminophen (NORCO/VICODIN) 5-325 MG tablet Take 1-2 tablets by mouth every 4 (four) hours as needed. Patient taking differently: Take 1-2 tablets by mouth at bedtime as needed for severe pain.  02/20/18  Yes Shadad, Firas N, MD  hydrOXYzine (ATARAX/VISTARIL) 10 MG tablet TAKE 1 TABLET BY MOUTH EVERY 6 HOURS AS NEEDED Patient taking differently: Take 10 mg by mouth daily as needed for itching (water retention).  04/16/18  Yes Shadad, Firas N, MD  lidocaine-prilocaine (EMLA) cream Apply 1 application topically as needed. Apply to port before chemotherapy. 01/23/17  Yes Shadad, Firas N, MD  loperamide (IMODIUM A-D) 2 MG tablet Take 2 mg by mouth 4 (four) times daily as needed for diarrhea or loose stools.   Yes [provider]  Multiple Vitamin (MULTIVITAMIN) tablet Take 1 tablet by mouth daily.   Yes [provider]  naproxen sodium (ALEVE) 220 MG tablet Take 440 mg by mouth daily as needed (pain).    Yes [provider]  omeprazole (PRILOSEC) 20 MG capsule Take 20 mg by mouth every morning.   Yes [provider]  pseudoephedrine-acetaminophen (TYLENOL SINUS) 30-500 MG TABS tablet Take 1 tablet by mouth daily as needed (congestion).    Yes [provider]  sitaGLIPtin (JANUVIA) 100 MG tablet Take 1 tablet (100 mg total) by mouth daily. 09/29/17  Yes Gherghe, Cristina, MD  Blood Glucose Monitoring Suppl (ONE TOUCH ULTRA 2) w/Device KIT Use to check blood sugar 2 times per day. 10/30/15   Kumar, Ajay, MD  calcium carbonate (TUMS - DOSED IN MG ELEMENTAL CALCIUM) 500 MG chewable tablet Chew 1 tablet (200 mg of elemental calcium total) by mouth 2 (two) times daily as needed for indigestion or heartburn. 10/26/17   Patel, Pranav M, MD  glucose blood (ONE TOUCH ULTRA TEST) test strip Use to check blood sugar 2 times  per day. 01/25/16   Gherghe, Cristina, MD  ONETOUCH DELICA LANCETS 33G MISC Use to check blood sugar 2 times per day. 01/27/16   Gherghe, Cristina, MD  prochlorperazine (COMPAZINE) 10 MG tablet Take 1 tablet (10 mg total) by mouth every 6 (six) hours as needed for nausea or vomiting. Patient not taking: Reported on 04/16/2018 10/17/17   Shadad, Firas N, MD    Family History Family History  Problem Relation Age of Onset  . Cancer Mother        breast/lived 15 years post  . Alzheimer's disease Mother        deceased  . Cancer Brother        thyroid  . Heart disease Brother   . Stroke Father        49 years old  .   Hypertension Father   . Alcoholism Father   . CVA Father   . Cirrhosis Paternal Grandfather        alcohol-related  . Cancer Paternal Grandmother        unknown type     Social History Social History   Tobacco Use  . Smoking status: Former Smoker    Packs/day: 1.00    Years: 43.00    Pack years: 43.00    Types: Cigarettes    Last attempt to quit: 12/07/2007    Years since quitting: 10.3  . Smokeless tobacco: Never Used  Substance Use Topics  . Alcohol use: Yes    Comment: OCCASIONAL  . Drug use: No     Allergies   Scallops [shellfish allergy] and Cephalexin   Review of Systems Review of Systems  Constitutional: Negative for chills and fever.  HENT: Positive for congestion. Negative for sore throat.   Eyes: Negative for visual disturbance.  Respiratory: Negative for cough and shortness of breath.   Cardiovascular: Negative for chest pain.  Gastrointestinal: Positive for abdominal pain and constipation. Negative for blood in stool, diarrhea, nausea and vomiting.  Genitourinary: Positive for frequency (chronic). Negative for dysuria and hematuria.  Musculoskeletal: Negative for back pain and neck pain.  Skin: Negative for color change.  Neurological: Negative for headaches.  All other systems reviewed and are negative.  Physical Exam Updated Vital  Signs BP 134/75   Pulse (!) 59   Temp 97.7 F (36.5 C) (Oral)   Resp 15   Ht 5' 10.5" (1.791 m)   Wt 79.8 kg   SpO2 98%   BMI 24.90 kg/m   Physical Exam  Constitutional: He appears well-developed and well-nourished.  HENT:  Head: Normocephalic and atraumatic.  Eyes: Conjunctivae are normal.  Neck: Neck supple.  Cardiovascular: Normal rate and regular rhythm.  No murmur heard. Pulmonary/Chest: Effort normal and breath sounds normal. No respiratory distress.  Abdominal: Soft. Bowel sounds are normal. There is tenderness (mild periumbilcal). There is no rigidity, no rebound, no guarding and no CVA tenderness.  Some areas of firmness to the right and left side of the abd. Question hepatomegaly and splenomegaly.  Musculoskeletal: He exhibits no edema.  Neurological: He is alert.  Skin: Skin is warm and dry.  Psychiatric: He has a normal mood and affect.  Nursing note and vitals reviewed.    ED Treatments / Results  Labs (all labs ordered are listed, but only abnormal results are displayed) Labs Reviewed  COMPREHENSIVE METABOLIC PANEL - Abnormal; Notable for the following components:      Result Value   Glucose, Bld 159 (*)    BUN 26 (*)    All other components within normal limits  CBC - Abnormal; Notable for the following components:   RBC 3.62 (*)    Hemoglobin 10.3 (*)    HCT 33.0 (*)    All other components within normal limits  LIPASE, BLOOD  URINALYSIS, ROUTINE W REFLEX MICROSCOPIC    EKG None  Radiology Ct Abdomen Pelvis W Contrast  Result Date: 04/16/2018 CLINICAL DATA:  Pt c/o abd pain, hx of colitis last time he waited too long to come inHx of prostate CA that has metastisized per ptHx of 33 chemo sessions finished in Oct per ptHx of appy EXAM: CT ABDOMEN AND PELVIS WITH CONTRAST TECHNIQUE: Multidetector CT imaging of the abdomen and pelvis was performed using the standard protocol following bolus administration of intravenous contrast. CONTRAST:  168m  ISOVUE-300 IOPAMIDOL (ISOVUE-300) INJECTION 61%  COMPARISON:  03/13/2018 and older exams. FINDINGS: Lower chest: Small right and trace left pleural effusions. Mild dependent lung base opacity, greater on the right consistent with atelectasis. There is nodularity along the right oblique fissure concerning for lymph and aches bread of malignancy. These findings are stable from the most recent prior CT. Hepatobiliary: There are numerous small low-density liver masses consistent with metastatic disease, which has significantly progressed since the prior exam. On the previous CT scan, there was a single low-attenuation mass in the left lobe, posteriorly in segment 2, measuring 9 mm. This lesion is stable. Multiple other lesions are new. Largest of these lies in segment 2 measuring 12 mm. Gallbladder is unremarkable.  No bile duct dilation. Pancreas: Unremarkable. No pancreatic ductal dilatation or surrounding inflammatory changes. Spleen: Normal in size without focal abnormality. Adrenals/Urinary Tract: No adrenal masses. Kidneys normal size, orientation and position with no masses, stones or hydronephrosis. There is symmetric enhancement and excretion. Normal ureters. Bladder is decompressed and not well evaluated. No gross bladder abnormality. Stomach/Bowel: Stomach is unremarkable. Small bowel is normal in caliber. There are loops of decompressed small bowel with poorly defined serosal margins. Trace amount of ascites lies between leaves of the small bowel mesentery and adjacent to the liver. Colon is normal in caliber. No colonic wall thickening or inflammation. There is hazy increased opacity in ill-defined reticular opacities in the mesentery and along the greater omentum, which is increased compared to the prior CT. There also small ill-defined mesenteric and omental nodules. Vascular/Lymphatic: Subcentimeter periaortic lymph nodes are stable from the most recent prior exam. There are no pathologically enlarged  lymph nodes. Stable aortic and branch vessel atherosclerosis. Reproductive: Prior prostatectomy. Stable appearance of the surgical bed. Other: No abdominal wall hernia. Musculoskeletal: No fracture or acute finding. Small focus of sclerosis in the right ilium, stable. No osteolytic lesions. IMPRESSION: 1. Since the prior CT, numerous small liver lesions have developed consistent with metastatic disease. 2. There is a trace amount of ascites with this increased opacity in the mesentery and along the omentum. Small ill-defined omental and mesenteric nodules are noted. Peritoneal carcinomatosis is suspected. 3. Small right and trace left pleural effusions. Nodularity noted along the right oblique fissure suspicious for lymphangitic spread of carcinoma. Lung base findings are stable from the prior CT. 4. No acute abnormalities.  No convincing colitis or enteritis. 5. Aortic atherosclerosis. Electronically Signed   By: David  Ormond M.D.   On: 04/16/2018 14:01    Procedures Procedures (including critical care time)  Medications Ordered in ED Medications  iopamidol (ISOVUE-300) 61 % injection (has no administration in time range)  sodium chloride (PF) 0.9 % injection (has no administration in time range)  iopamidol (ISOVUE-300) 61 % injection 100 mL (100 mLs Intravenous Contrast Given 04/16/18 1320)     Initial Impression / Assessment and Plan / ED Course  I have reviewed the triage vital signs and the nursing notes.  Pertinent labs & imaging results that were available during my care of the patient were reviewed by me and considered in my medical decision making (see chart for details).     Final Clinical Impressions(s) / ED Diagnoses   Final diagnoses:  Abdominal pain, unspecified abdominal location  Liver lesion   Patient with a history of prostate cancer with known mets to the omentum presenting to the ED today complaining of worsening abdominal pain for the last month.  No fevers, chills, GI  or GU symptoms to accompany pain.  Pain well managed   by hydrocodone at home.  Reviewed CT scan from 1 month ago showed interval decrease in ascites in the abdomen and in the right periaortic lymph node. Similar sclerotic osseous metastatic disease noted.  Labs today are at baseline for the patient.  No leukocytosis.  Mild anemia that is baseline.  CMP with mildly elevated BUN but creatinine remains normal.  Electrolytes are within normal limits.  Liver enzymes are normal.  Lipase is negative.  UA is negative.  CT scan today revealed new lesions to the liver suggestive of metastatic disease.  Also with some ascites in the abdomen and likely peritoneal carcinomatosis.  Also with bilateral trace pleural effusions.  Also with a nodularity noted in the right oblique fissure of the lung suspicious for lymphangitic spread of carcinoma.  Discussed the case with patient's oncologist, Dr. Alen Blew, who stated that since patient stable, tolerating p.o.'s and pain is under control that he can follow-up as an outpatient.  He states that his office will contact the patient in 24 hours.   Discussed findings with patient and his wife at bedside.  They feel comfortable with the plan.  Patient states he has had no difficulty eating at home and has no nausea therefore does not feel he needs any prescription for antiemetics.  He states his pain has been under control with intermittent dosing of his Norco at home and he does not need an additional prescription for this.  Advised return to ER for new or worsening symptoms.  He was understanding the plan and reasons to return the ED.  All questions answered.  Discussed case with Dr. Stark Jock who is in agreement with the plan.  ED Discharge Orders    None       Rodney Booze, [place] 04/16/18 1536    Veryl Speak, MD 04/16/18 1609

## 2018-04-16 NOTE — ED Triage Notes (Signed)
Patient c/o mid abdominal pain x 1 month., worse in the past week. Patient reports a history of colitis and prostate cancer. Patient is not currently receiving chemo or radiation.

## 2018-04-17 ENCOUNTER — Inpatient Hospital Stay: Payer: PPO | Attending: Oncology | Admitting: Oncology

## 2018-04-17 ENCOUNTER — Telehealth: Payer: Self-pay | Admitting: Oncology

## 2018-04-17 VITALS — BP 133/68 | HR 72 | Temp 97.6°F | Resp 18 | Ht 70.0 in | Wt 173.1 lb

## 2018-04-17 DIAGNOSIS — Z9221 Personal history of antineoplastic chemotherapy: Secondary | ICD-10-CM | POA: Diagnosis not present

## 2018-04-17 DIAGNOSIS — Z5111 Encounter for antineoplastic chemotherapy: Secondary | ICD-10-CM | POA: Insufficient documentation

## 2018-04-17 DIAGNOSIS — D649 Anemia, unspecified: Secondary | ICD-10-CM | POA: Insufficient documentation

## 2018-04-17 DIAGNOSIS — R109 Unspecified abdominal pain: Secondary | ICD-10-CM | POA: Diagnosis not present

## 2018-04-17 DIAGNOSIS — Z79899 Other long term (current) drug therapy: Secondary | ICD-10-CM | POA: Insufficient documentation

## 2018-04-17 DIAGNOSIS — E119 Type 2 diabetes mellitus without complications: Secondary | ICD-10-CM | POA: Diagnosis not present

## 2018-04-17 DIAGNOSIS — Z8546 Personal history of malignant neoplasm of prostate: Secondary | ICD-10-CM | POA: Insufficient documentation

## 2018-04-17 DIAGNOSIS — Z7984 Long term (current) use of oral hypoglycemic drugs: Secondary | ICD-10-CM | POA: Insufficient documentation

## 2018-04-17 DIAGNOSIS — C61 Malignant neoplasm of prostate: Secondary | ICD-10-CM | POA: Insufficient documentation

## 2018-04-17 DIAGNOSIS — C786 Secondary malignant neoplasm of retroperitoneum and peritoneum: Secondary | ICD-10-CM | POA: Diagnosis not present

## 2018-04-17 DIAGNOSIS — C787 Secondary malignant neoplasm of liver and intrahepatic bile duct: Secondary | ICD-10-CM | POA: Insufficient documentation

## 2018-04-17 MED ORDER — HYDROCODONE-ACETAMINOPHEN 5-325 MG PO TABS: 1.0000 | ORAL_TABLET | ORAL | 0 refills | Status: DC | PRN

## 2018-04-17 MED ORDER — PROCHLORPERAZINE MALEATE 10 MG PO TABS: 10.0000 mg | ORAL_TABLET | Freq: Four times a day (QID) | ORAL | 0 refills | Status: AC | PRN

## 2018-04-17 NOTE — Telephone Encounter (Signed)
Scheduled appt per 12/10 sch message - unable to get in contact with patient or leave message - let Rn know

## 2018-04-17 NOTE — Telephone Encounter (Signed)
Per 12/10 los, patient did not want injection appointments. Okay per Carney.  Printed calendar and avs.

## 2018-04-17 NOTE — Progress Notes (Signed)
DISCONTINUE OFF PATHWAY REGIMEN - Prostate   [id number]:Carboplatin + Paclitaxel (5/175) q21 Days:   A cycle is every 21 days:     Paclitaxel      Carboplatin   **Always confirm dose/schedule in your pharmacy ordering system**  REASON: Disease Progression PRIOR TREATMENT: Off Pathway: Carboplatin + Paclitaxel (5/175) q21 Days TREATMENT RESPONSE: Unable to Evaluate  START OFF PATHWAY REGIMEN - Prostate   [id number]:Cabazitaxel 20 mg/m2 q21 Days:   A cycle is every 21 days.:     Cabazitaxel      Prednisone   **Always confirm dose/schedule in your pharmacy ordering system**  Patient Characteristics: Small Cell Carcinoma, Extensive Stage, Distant Metastases Histology: Small Cell Carcinoma, Extensive Stage Therapeutic Status: Distant Metastases  Intent of Therapy: Non-Curative / Palliative Intent, Discussed with Patient

## 2018-04-17 NOTE — Progress Notes (Signed)
Hematology and Oncology Follow Up Visit  Frank Larsen [id number] 31-Oct-1945 72 y.o. 04/17/2018 2:25 PM Frank Larsen, Frank Brow, MD   Principle Diagnosis: 72 year old man with advanced prostate cancer with poorly differentiated features documented in June 2018.  He was initially diagnosed with prostate cancer in 2013.  Prior Therapy:  He is status post robotic-assisted laparoscopic radical prostatectomy and extended lymphadenectomy with the pathology reveals T2N0.  completed in June 2013.  He developed recurrent disease in 2017 and was treated with Taxotere chemotherapy at 75 mg/m every 3 weeks started on 03/30/2016. He is status post 9 cycles of therapy.   He developed relapsed disease and a biopsy proven to show neuroendocrine poorly differentiated tumor in June 2018. He is S/P Carboplatin and etoposide. Cycle 1 given on 10/24/2016. He completed cycle 6 on February 13, 2017 with near complete response.  He developed a relapse disease in 2019.  Carboplatin and paclitaxel cycle 1 given on 10/17/2017.  He completed 6 cycles of therapy in October 2019.   Current therapy: Active surveillance.  He is under evaluation to start salvage therapy.  Interim History: Frank Larsen returns today for a repeat evaluation.  Since the last visit, he started developing abdominal pain was seen in the emergency department on 04/16/2018.  He underwent a CT scan of the abdomen and pelvis which showed worsening carcinomatosis and hepatic metastasis.  Clinically, he reports nagging dull abdominal pain that is graded 4 out of 10 and not associated with any nausea, vomiting or diarrhea.  He still able to eat and performs activities of daily living.  He denies any decline in his performance status.  He does not report any headaches, blurry vision, syncope or seizures.  He denied any alteration mental status or confusion.  He does not report any fevers, chills, sweats.  He does not report any chest pain,  palpitation, orthopnea or leg edema. He does not report any cough, wheezing or hemoptysis. He does not report any constipation or diarrhea.  He  does not report any frequency, urgency or hematuria. He does not report any arthralgias or myalgias.  He denies any ecchymosis or petechiae.  Denies any anxiety or depression.  Remaining review of systems is negative.   Medications: I have reviewed the patient's current medications.  Current Outpatient Medications  Medication Sig Dispense Refill  . atorvastatin (LIPITOR) 10 MG tablet Take 10 mg by mouth daily with breakfast.     . Blood Glucose Monitoring Suppl (ONE TOUCH ULTRA 2) w/Device KIT Use to check blood sugar 2 times per day. 1 each 2  . calcium carbonate (TUMS - DOSED IN MG ELEMENTAL CALCIUM) 500 MG chewable tablet Chew 1 tablet (200 mg of elemental calcium total) by mouth 2 (two) times daily as needed for indigestion or heartburn. 30 tablet 0  . glipiZIDE (GLUCOTROL XL) 5 MG 24 hr tablet Take 2 tablets (10 mg total) by mouth daily with breakfast. 180 tablet 3  . glucose blood (ONE TOUCH ULTRA TEST) test strip Use to check blood sugar 2 times per day. 200 each 5  . HYDROcodone-acetaminophen (NORCO/VICODIN) 5-325 MG tablet Take 1-2 tablets by mouth every 4 (four) hours as needed. (Patient taking differently: Take 1-2 tablets by mouth at bedtime as needed for severe pain. ) 90 tablet 0  . hydrOXYzine (ATARAX/VISTARIL) 10 MG tablet TAKE 1 TABLET BY MOUTH EVERY 6 HOURS AS NEEDED (Patient taking differently: Take 10 mg by mouth daily as needed for itching (water retention). ) 60 tablet 0  .  lidocaine-prilocaine (EMLA) cream Apply 1 application topically as needed. Apply to port before chemotherapy. 30 g 0  . loperamide (IMODIUM A-D) 2 MG tablet Take 2 mg by mouth 4 (four) times daily as needed for diarrhea or loose stools.    . Multiple Vitamin (MULTIVITAMIN) tablet Take 1 tablet by mouth daily.    . naproxen sodium (ALEVE) 220 MG tablet Take 440 mg by  mouth daily as needed (pain).     . omeprazole (PRILOSEC) 20 MG capsule Take 20 mg by mouth every morning.    . ONETOUCH DELICA LANCETS 33G MISC Use to check blood sugar 2 times per day. 200 each 2  . prochlorperazine (COMPAZINE) 10 MG tablet Take 1 tablet (10 mg total) by mouth every 6 (six) hours as needed for nausea or vomiting. (Patient not taking: Reported on 04/16/2018) 30 tablet 0  . pseudoephedrine-acetaminophen (TYLENOL SINUS) 30-500 MG TABS tablet Take 1 tablet by mouth daily as needed (congestion).     . sitaGLIPtin (JANUVIA) 100 MG tablet Take 1 tablet (100 mg total) by mouth daily. 90 tablet 3   No current facility-administered medications for this visit.      Allergies:  Allergies  Allergen Reactions  . Scallops [Shellfish Allergy] Nausea And Vomiting  . Cephalexin Hives and Rash    Past Medical History, Surgical history, Social history, and Family History updated and remain unchanged.   Physical Exam:  Blood pressure 133/68, pulse 72, temperature 97.6 F (36.4 C), temperature source Oral, resp. rate 18, height 5' 10" (1.778 m), weight 173 lb 1.6 oz (78.5 kg), SpO2 98 %.    ECOG: 1    General appearance: Alert, awake without any distress. Head: Atraumatic without abnormalities Oropharynx: Without any thrush or ulcers. Eyes: No scleral icterus. Lymph nodes: No lymphadenopathy noted in the cervical, supraclavicular, or axillary nodes Heart:regular rate and rhythm, without any murmurs or gallops.   Lung: Clear to auscultation without any rhonchi, wheezes or dullness to percussion. Abdomin: Soft, nontender without any shifting dullness or ascites. Musculoskeletal: No clubbing or cyanosis. Neurological: No motor or sensory deficits. Skin: No rashes or lesions.        Lab Results: Lab Results  Component Value Date   WBC 5.4 04/16/2018   HGB 10.3 (L) 04/16/2018   HCT 33.0 (L) 04/16/2018   MCV 91.2 04/16/2018   PLT 300 04/16/2018     Chemistry       Component Value Date/Time   NA 140 04/16/2018 1151   NA 140 04/25/2017 1136   K 3.8 04/16/2018 1151   K 3.9 04/25/2017 1136   CL 105 04/16/2018 1151   CO2 23 04/16/2018 1151   CO2 25 04/25/2017 1136   BUN 26 (H) 04/16/2018 1151   BUN 15.8 04/25/2017 1136   CREATININE 0.88 04/16/2018 1151   CREATININE 1.10 03/13/2018 0839   CREATININE 0.9 04/25/2017 1136   GLU 155 08/24/2015      Component Value Date/Time   CALCIUM 8.9 04/16/2018 1151   CALCIUM 9.2 04/25/2017 1136   ALKPHOS 46 04/16/2018 1151   ALKPHOS 55 04/25/2017 1136   AST 23 04/16/2018 1151   AST 15 03/13/2018 0839   AST 19 04/25/2017 1136   ALT 19 04/16/2018 1151   ALT 14 03/13/2018 0839   ALT 15 04/25/2017 1136   BILITOT 0.5 04/16/2018 1151   BILITOT <0.2 (L) 03/13/2018 0839   BILITOT 0.32 04/25/2017 1136      CLINICAL DATA:  Pt c/o abd pain, hx of colitis last   time he waited too long to come inHx of prostate CA that has metastisized per ptHx of 33 chemo sessions finished in Oct per ptHx of appy  EXAM: CT ABDOMEN AND PELVIS WITH CONTRAST  TECHNIQUE: Multidetector CT imaging of the abdomen and pelvis was performed using the standard protocol following bolus administration of intravenous contrast.  CONTRAST:  195m ISOVUE-300 IOPAMIDOL (ISOVUE-300) INJECTION 61%  COMPARISON:  03/13/2018 and older exams.  FINDINGS: Lower chest: Small right and trace left pleural effusions. Mild dependent lung base opacity, greater on the right consistent with atelectasis. There is nodularity along the right oblique fissure concerning for lymph and aches bread of malignancy. These findings are stable from the most recent prior CT.  Hepatobiliary: There are numerous small low-density liver masses consistent with metastatic disease, which has significantly progressed since the prior exam. On the previous CT scan, there was a single low-attenuation mass in the left lobe, posteriorly in segment 2, measuring 9 mm. This  lesion is stable. Multiple other lesions are new. Largest of these lies in segment 2 measuring 12 mm.  Gallbladder is unremarkable.  No bile duct dilation.  Pancreas: Unremarkable. No pancreatic ductal dilatation or surrounding inflammatory changes.  Spleen: Normal in size without focal abnormality.  Adrenals/Urinary Tract: No adrenal masses. Kidneys normal size, orientation and position with no masses, stones or hydronephrosis. There is symmetric enhancement and excretion. Normal ureters. Bladder is decompressed and not well evaluated. No gross bladder abnormality.  Stomach/Bowel: Stomach is unremarkable. Small bowel is normal in caliber. There are loops of decompressed small bowel with poorly defined serosal margins. Trace amount of ascites lies between leaves of the small bowel mesentery and adjacent to the liver. Colon is normal in caliber. No colonic wall thickening or inflammation.  There is hazy increased opacity in ill-defined reticular opacities in the mesentery and along the greater omentum, which is increased compared to the prior CT. There also small ill-defined mesenteric and omental nodules.  Vascular/Lymphatic: Subcentimeter periaortic lymph nodes are stable from the most recent prior exam. There are no pathologically enlarged lymph nodes. Stable aortic and branch vessel atherosclerosis.  Reproductive: Prior prostatectomy. Stable appearance of the surgical bed.  Other: No abdominal wall hernia.  Musculoskeletal: No fracture or acute finding. Small focus of sclerosis in the right ilium, stable. No osteolytic lesions.  IMPRESSION: 1. Since the prior CT, numerous small liver lesions have developed consistent with metastatic disease. 2. There is a trace amount of ascites with this increased opacity in the mesentery and along the omentum. Small ill-defined omental and mesenteric nodules are noted. Peritoneal carcinomatosis is suspected. 3. Small  right and trace left pleural effusions. Nodularity noted along the right oblique fissure suspicious for lymphangitic spread of carcinoma. Lung base findings are stable from the prior CT. 4. No acute abnormalities.  No convincing colitis or enteritis. 5. Aortic atherosclerosis.   Impression and Plan:   72year old man with:  1.  Advanced prostate cancer with neuroendocrine features documented in June 2018.  Next generation sequencing did not show any actionable mutation.  He is status post therapy outlined above including carboplatin and etoposide as well as carboplatin and Taxol.  CT scan obtained on 04/16/2018 was personally reviewed and the natural course of this disease was updated.  Given these findings, he has clearly progressed and require salvage therapy.  These options would include single agent Jevtana, immunotherapy or clinical trial.  [facility].  After discussion today and given  his recent symptoms he is willing to start chemotherapy as soon as possible and simultaneously obtain a second opinion at [facility].  I will proceed with Jevtana chemotherapy with the complications reiterated today.  These were include nausea, fatigue, myelosuppression, diarrhea and infusion related complications.  Worsening peripheral neuropathy was also discussed.  He is agreeable to proceed at this time.  2.  Abdominal pain: Manageable at this time using hydrocodone 1-2 times a day.  This medications been refilled for him.   3. IV access: Port-A-Cath will be used for chemotherapy.     4. Anemia: Hemoglobin hemoglobin remains stable without any issues.  No need for transfusion at this time.  5.  Prognosis: His disease remains incurable although the fact that he has relapsed rather quickly is very concerning.  His performance status remains adequate and he still desires aggressive therapy.  6.  Neuropathy: Unchanged at this time we will continue to  monitor on Jevtana chemotherapy.  7.  Antiemetics: Compazine will be refilled for him.  8.  Neutropenia prophylaxis: He has deferred the option of growth factor support at this time.  9. Follow-up: Will be in in the near future to start chemotherapy.  25  minutes was spent with the patient face-to-face today.  More than 50% of time was dedicated to reviewing his disease status, treatment options, reviewing imaging studies and discussing prognosis and long term care.  Zola Button, MD 12/10/20192:25 PM

## 2018-04-18 ENCOUNTER — Telehealth: Payer: Self-pay

## 2018-04-18 NOTE — Telephone Encounter (Signed)
Referral made to [facility] with Dr. Iona Beard for second opinion at [phone]. Appointment scheduled for 1/27 at 1:10pm. All information was provided to the patient and explained that [facility] will mail a new patient information packet along with a questionnaire that he needs to bring to the first appointment. Patient verbalized understanding and had no other questions or concerns. All requested forms are being faxed to [facility] at [phone].

## 2018-04-24 ENCOUNTER — Inpatient Hospital Stay: Payer: PPO

## 2018-04-24 VITALS — BP 130/70 | HR 67 | Temp 98.1°F | Resp 18

## 2018-04-24 DIAGNOSIS — Z95828 Presence of other vascular implants and grafts: Secondary | ICD-10-CM

## 2018-04-24 DIAGNOSIS — C786 Secondary malignant neoplasm of retroperitoneum and peritoneum: Secondary | ICD-10-CM

## 2018-04-24 DIAGNOSIS — C61 Malignant neoplasm of prostate: Secondary | ICD-10-CM

## 2018-04-24 DIAGNOSIS — Z5111 Encounter for antineoplastic chemotherapy: Secondary | ICD-10-CM | POA: Diagnosis not present

## 2018-04-24 LAB — CBC WITH DIFFERENTIAL (CANCER CENTER ONLY)
Abs Immature Granulocytes: 0.03 10*3/uL (ref 0.00–0.07)
Basophils Absolute: 0 10*3/uL (ref 0.0–0.1)
Basophils Relative: 0 %
EOS ABS: 0.2 10*3/uL (ref 0.0–0.5)
EOS PCT: 3 %
HCT: 31.4 % — ABNORMAL LOW (ref 39.0–52.0)
Hemoglobin: 10.1 g/dL — ABNORMAL LOW (ref 13.0–17.0)
Immature Granulocytes: 1 %
Lymphocytes Relative: 11 %
Lymphs Abs: 0.7 10*3/uL (ref 0.7–4.0)
MCH: 29.2 pg (ref 26.0–34.0)
MCHC: 32.2 g/dL (ref 30.0–36.0)
MCV: 90.8 fL (ref 80.0–100.0)
Monocytes Absolute: 0.7 10*3/uL (ref 0.1–1.0)
Monocytes Relative: 11 %
Neutro Abs: 4.6 10*3/uL (ref 1.7–7.7)
Neutrophils Relative %: 74 %
Platelet Count: 270 10*3/uL (ref 150–400)
RBC: 3.46 MIL/uL — ABNORMAL LOW (ref 4.22–5.81)
RDW: 14 % (ref 11.5–15.5)
WBC Count: 6.2 10*3/uL (ref 4.0–10.5)
nRBC: 0 % (ref 0.0–0.2)

## 2018-04-24 LAB — CMP (CANCER CENTER ONLY)
ALT: 15 U/L (ref 0–44)
AST: 20 U/L (ref 15–41)
Albumin: 3.3 g/dL — ABNORMAL LOW (ref 3.5–5.0)
Alkaline Phosphatase: 57 U/L (ref 38–126)
Anion gap: 9 (ref 5–15)
BUN: 19 mg/dL (ref 8–23)
CHLORIDE: 105 mmol/L (ref 98–111)
CO2: 26 mmol/L (ref 22–32)
CREATININE: 0.98 mg/dL (ref 0.61–1.24)
Calcium: 9.3 mg/dL (ref 8.9–10.3)
GFR, Est AFR Am: >60 mL/min (ref >60)
GFR, Estimated: >60 mL/min (ref >60)
Glucose, Bld: 235 mg/dL — ABNORMAL HIGH (ref 70–99)
Potassium: 4.3 mmol/L (ref 3.5–5.1)
Sodium: 140 mmol/L (ref 135–145)
Total Bilirubin: 0.3 mg/dL (ref 0.3–1.2)
Total Protein: 7.1 g/dL (ref 6.5–8.1)

## 2018-04-24 MED ORDER — DIPHENHYDRAMINE HCL 50 MG/ML IJ SOLN
INTRAMUSCULAR | Status: AC
  Filled 2018-04-24: qty 1

## 2018-04-24 MED ORDER — SODIUM CHLORIDE 0.9 % IV SOLN
Freq: Once | INTRAVENOUS | Status: AC
  Administered 2018-04-24: 12:00:00 via INTRAVENOUS
  Filled 2018-04-24: qty 250

## 2018-04-24 MED ORDER — SODIUM CHLORIDE 0.9 % IV SOLN
10.0000 mg | Freq: Once | INTRAVENOUS | Status: AC
  Administered 2018-04-24: 10 mg via INTRAVENOUS
  Filled 2018-04-24: qty 1

## 2018-04-24 MED ORDER — DIPHENHYDRAMINE HCL 50 MG/ML IJ SOLN
25.0000 mg | Freq: Once | INTRAMUSCULAR | Status: AC
  Administered 2018-04-24: 25 mg via INTRAVENOUS

## 2018-04-24 MED ORDER — FAMOTIDINE IN NACL 20-0.9 MG/50ML-% IV SOLN
20.0000 mg | Freq: Once | INTRAVENOUS | Status: AC
  Administered 2018-04-24: 20 mg via INTRAVENOUS

## 2018-04-24 MED ORDER — SODIUM CHLORIDE 0.9% FLUSH
10.0000 mL | INTRAVENOUS | Status: DC | PRN
  Administered 2018-04-24: 10 mL via INTRAVENOUS
  Filled 2018-04-24: qty 10

## 2018-04-24 MED ORDER — FAMOTIDINE IN NACL 20-0.9 MG/50ML-% IV SOLN
INTRAVENOUS | Status: AC
  Filled 2018-04-24: qty 50

## 2018-04-24 MED ORDER — HEPARIN SOD (PORK) LOCK FLUSH 100 UNIT/ML IV SOLN
500.0000 [IU] | Freq: Once | INTRAVENOUS | Status: AC | PRN
  Administered 2018-04-24: 500 [IU]
  Filled 2018-04-24: qty 5

## 2018-04-24 MED ORDER — SODIUM CHLORIDE 0.9 % IV SOLN
40.0000 mg | Freq: Once | INTRAVENOUS | Status: AC
  Administered 2018-04-24: 40 mg via INTRAVENOUS
  Filled 2018-04-24: qty 4

## 2018-04-24 MED ORDER — SODIUM CHLORIDE 0.9% FLUSH
10.0000 mL | INTRAVENOUS | Status: DC | PRN
  Administered 2018-04-24: 10 mL
  Filled 2018-04-24: qty 10

## 2018-04-24 NOTE — Progress Notes (Signed)
Day 3 [facility] orders deleted for all cycles as requested by MD.  Hardie Pulley, PharmD, BCPS, BCOP

## 2018-04-24 NOTE — Patient Instructions (Addendum)
[facility] Discharge Instructions for Patients Receiving Chemotherapy  Today you received the following chemotherapy agent: Jevtana (Cabazitaxel)  To help prevent nausea and vomiting after your treatment, we encourage you to take your nausea medication as prescribed   If you develop nausea and vomiting that is not controlled by your nausea medication, call the clinic.   BELOW ARE SYMPTOMS THAT SHOULD BE REPORTED IMMEDIATELY:  *FEVER GREATER THAN 100.5 F  *CHILLS WITH OR WITHOUT FEVER  NAUSEA AND VOMITING THAT IS NOT CONTROLLED WITH YOUR NAUSEA MEDICATION  *UNUSUAL SHORTNESS OF BREATH  *UNUSUAL BRUISING OR BLEEDING  TENDERNESS IN MOUTH AND THROAT WITH OR WITHOUT PRESENCE OF ULCERS  *URINARY PROBLEMS  *BOWEL PROBLEMS  UNUSUAL RASH Items with * indicate a potential emergency and should be followed up as soon as possible.  Feel free to call the clinic should you have any questions or concerns. The clinic phone number is [phone].  Please show the [facility] at check-in to the Emergency Department and triage nurse.    Cabazitaxel injection What is this medicine? CABAZITAXEL (ka baz i TAX el) is a chemotherapy drug. It is used to treat prostate cancer. It targets fast dividing cells, like cancer cells, and causes these cells to die. This medicine may be used for other purposes; ask your health care provider or pharmacist if you have questions. COMMON BRAND NAME(S): Jevtana What should I tell my health care provider before I take this medicine? They need to know if you have any of these conditions: -history of stomach bleeding -kidney disease -liver disease -low blood counts, like low white cell, platelet, or red cell counts -lung or breathing disease, like asthma -recent or ongoing radiation therapy -take medicines that treat or prevent blood clots -an unusual or allergic reaction to cabazitaxel, polysorbate 80, other medicines, foods, dyes, or  preservatives -pregnant or trying to get pregnant -breast-feeding How should I use this medicine? This medicine is for infusion into a vein. It is given by a health care professional in a hospital or clinic setting. Talk to your pediatrician regarding the use of this medicine in children. Special care may be needed. Overdosage: If you think you have taken too much of this medicine contact a poison control center or emergency room at once. NOTE: This medicine is only for you. Do not share this medicine with others. What if I miss a dose? It is important not to miss your dose. Call your doctor or health care professional if you are unable to keep an appointment. What may interact with this medicine? -antiviral medicines for HIV or AIDS -clarithromycin -medicines for fungal infections like ketoconazole, fluconazole, itraconazole, and voriconazole -nefazodone -telithromycin This list may not describe all possible interactions. Give your health care provider a list of all the medicines, herbs, non-prescription drugs, or dietary supplements you use. Also tell them if you smoke, drink alcohol, or use illegal drugs. Some items may interact with your medicine. What should I watch for while using this medicine? Your condition will be monitored carefully while you are receiving this medicine. This drug may make you feel generally unwell. This is not uncommon, as chemotherapy can affect healthy cells as well as cancer cells. Report any side effects. Continue your course of treatment even though you feel ill unless your doctor tells you to stop. Call your doctor or health care professional for advice if you get a fever, chills or sore throat, or other symptoms of a cold or flu. Do not  treat yourself. This drug decreases your body's ability to fight infections. Try to avoid being around people who are sick. This medicine may increase your risk to bruise or bleed. Call your doctor or health care professional  if you notice any unusual bleeding. Be careful brushing and flossing your teeth or using a toothpick because you may get an infection or bleed more easily. If you have any dental work done, tell your dentist you are receiving this medicine. Avoid taking products that contain aspirin, acetaminophen, ibuprofen, naproxen, or ketoprofen unless instructed by your doctor. These medicines may hide a fever. Do not become pregnant while taking this medicine. Women should inform their doctor if they wish to become pregnant or think they might be pregnant. Men should not father a child while taking this medicine and for 3 months after stopping it. There is a potential for serious side effects to an unborn child. Talk to your health care professional or pharmacist for more information. Do not breast-feed an infant while taking this medicine. What side effects may I notice from receiving this medicine? Side effects that you should report to your doctor or health care professional as soon as possible: -allergic reactions like skin rash, itching or hives, swelling of the face, lips, or tongue -blood in the urine -breathing problems -constipation -dark urine -diarrhea -pain in the lower back or side -pain, tingling, numbness in the hands or feet -pain when urinating -severe abdominal pain -signs of infection - fever or chills, cough, sore throat, pain or difficulty passing urine -signs and symptoms of kidney injury like trouble passing urine or change in the amount of urine -signs of decreased platelets or bleeding - bruising, pinpoint red spots on the skin, black, tarry stools, blood in the urine -signs of decreased red blood cells - unusually weak or tired, fainting spells, lightheadedness -vomiting Side effects that usually do not require medical attention (report to your doctor or health care professional if they continue or are bothersome): -back pain -change in taste -hair loss -headache -loss of  appetite -muscle or joint pain -nausea -upset stomach This list may not describe all possible side effects. Call your doctor for medical advice about side effects. You may report side effects to FDA at 1-800-FDA-1088. Where should I keep my medicine? This drug is given in a hospital or clinic and will not be stored at home. NOTE: This sheet is a summary. It may not cover all possible information. If you have questions about this medicine, talk to your doctor, pharmacist, or health care provider.  2018 Elsevier/Gold Standard (2016-05-20 15:38:47)

## 2018-05-10 ENCOUNTER — Ambulatory Visit: Payer: PPO | Admitting: Internal Medicine

## 2018-05-10 ENCOUNTER — Encounter: Payer: Self-pay | Admitting: Internal Medicine

## 2018-05-10 VITALS — BP 120/60 | HR 85 | Ht 70.0 in | Wt 176.0 lb

## 2018-05-10 DIAGNOSIS — E1165 Type 2 diabetes mellitus with hyperglycemia: Secondary | ICD-10-CM

## 2018-05-10 DIAGNOSIS — E785 Hyperlipidemia, unspecified: Secondary | ICD-10-CM

## 2018-05-10 DIAGNOSIS — E1142 Type 2 diabetes mellitus with diabetic polyneuropathy: Secondary | ICD-10-CM

## 2018-05-10 LAB — POCT GLYCOSYLATED HEMOGLOBIN (HGB A1C): Hemoglobin A1C: 8 % — AB (ref 4.0–5.6)

## 2018-05-10 NOTE — Patient Instructions (Addendum)
Please continue:  - Januvia 100 mg before b'fast - Glipizide ER 10 mg before b'fast  Please check sugars 1x a day, at different times of the day.  If your sugars increase, I would recommend NPH once a day in am.  Please return in 4 months with your sugar log.

## 2018-05-10 NOTE — Addendum Note (Signed)
Addended by: Cardell Peach I on: 05/10/2018 04:18 PM   Modules accepted: Orders

## 2018-05-10 NOTE — Progress Notes (Signed)
Patient ID: Frank Larsen, male   DOB: 10-15-1945, 73 y.o.   MRN: [id number]  HPI: Frank Larsen is a 73 y.o.-year-old male, returning for f/u for DM2, dx in 2013, non-insulin-dependent, fairly well controlled, with complications (PN). Last visit 3 mo ago.  Patient has metastatic prostate cancer, diagnosed in 2013.  He was initially on a chemotherapy regimen that did not work, then change the chemotherapy regimen and added Neulasta (now stopped).  This was stopped in 02/2017.  He was on dexamethasone with a treatments.  He had radiation treatments in 2015. He restarted ChTx >> finished 02/2018.  He now has new hepatic metastases and worsening carcinomatosis per review of Oncologist note and the new CT from 04/2018. He restarted ChTx.  Last hemoglobin A1c was: Lab Results  Component Value Date   HGBA1C 7.8 (A) 01/31/2018   HGBA1C 7.6 (A) 09/29/2017   HGBA1C 7.2 06/01/2017  02/17/2015: 7.7% 10/2014: 7.6%  Pt is on a regimen of:  >> stopped b/c weight loss - Glipizide ER 10 mg before b'fast - Januvia 100 mg before b'fast He was on Actos 30 mg daily in a.m. - added in 2016 >> stopped 09/06/2016.  Sugars did not significantly worsen after stopping Actos. Ran out of Januvia 100 mg daily in a.m. >> this was not covered at the end of last year (donut hole) >> now covered.  Pt checks sugars 0-1 X a day: - am:  176-242 (after coffee) >> 180-low 200s >> 159 - 2h after b'fast: 101-280 >> 142 >> 155-187 >> n/c >> 220 - before lunch:  68-187, 204 >> 173-194 >> n/c  - 2h after lunch:  174, 179 >> 101-225 >> 208, 216 >> 235 - before dinner: 90-167 >> n/c >> 76-167 >> 214 >> n/c - 2h after dinner: 123, 168 >> 168 >> n/c - bedtime: n/c - nighttime: n/c Lowest sugar was 68 >> 173 >> 159;  he has hypoglycemia awareness in the 70s. Highest sugar was 242 >> 333 >> 235.  Glucometer: One Touch Ultra.  -No CKD, last BUN/creatinine:  Lab Results  Component Value Date   BUN 19 04/24/2018   BUN 26  (H) 04/16/2018   CREATININE 0.98 04/24/2018   CREATININE 0.88 04/16/2018  08/24/2015: 14/1.01, EGFR 73 No results found for: MICRALBCREAT  He has a history of MAU.  On Losartan.  -+ HL; last set of lipids: 10/06/2017: 117/85/34/62 No results found for: CHOL, HDL, LDLCALC, LDLDIRECT, TRIG, CHOLHDL 08/24/2015: 141/134/41/74  On Lipitor.  - last eye exam was 2019: No DR reportedly, + cataract  -+ numbness and tingling in his feet. He has Ch-Tx -induced peripheral  Neuropathy.  He also has a h/o Melanoma and HTN.  He would like to go in a [place] cruise leaving from [place] in 03/2018.  ROS: Constitutional: no weight gain/+ weight loss, no fatigue, no subjective hyperthermia, no subjective hypothermia Eyes: no blurry vision, no xerophthalmia ENT: no sore throat, no nodules palpated in neck, no dysphagia, no odynophagia, no hoarseness Cardiovascular: no CP/no SOB/no palpitations/no leg swelling Respiratory: + cough/no SOB/no wheezing, + congestion Gastrointestinal: no N/no V/no D/no C/no acid reflux Musculoskeletal: no muscle aches/no joint aches Skin: no rashes, no hair loss Neurological: no tremors/+ numbness/+ tingling/no dizziness  I reviewed pt's medications, allergies, PMH, social hx, family hx, and changes were documented in the history of present illness. Otherwise, unchanged from my initial visit note.   Past Medical History:  Diagnosis Date  . AK (actinic keratosis)   .  Arthritis   . BPH (benign prostatic hyperplasia)   . Chronic low back pain    since 1979  . Diabetes mellitus ORAL MED   type 2  . Elevated PSA   . GERD (gastroesophageal reflux disease)   . Hearing loss    using bilateral hearing aids  . History of radiation therapy 01/11/14- 03/04/14   prostate bed 6600 cGy 33 sessions  . Hypercholesterolemia   . Hypercholesterolemia   . Hyperlipidemia   . Impaired hearing BILATERAL HEARING AIDS   20% RIGHT HEARING DUE TO VIRUS  . Melanoma ([place])   .  Normal cardiac stress test 2010  . Prostate cancer ([place]) 07/20/11   Gleason 9  . Prostate cancer ([place])   . Prostate nodule   . Skin cancer    malignant melanoma of skin   . Tinnitus    stable since 1992  . Tobacco use    Past Surgical History:  Procedure Laterality Date  . APPENDECTOMY  AGE 20  . CARPOMETACARPAL JOINT ARTHROTOMY  03-22-2006   RIGHT THUMB  . CARPOMETACARPAL JOINT ARTHROTOMY  12-21-2005   LEFT THUMB  . IR GENERIC HISTORICAL  03/22/2016   IR US GUIDE VASC ACCESS RIGHT [facility]  . IR GENERIC HISTORICAL  03/22/2016   IR FLUORO GUIDE PORT INSERTION RIGHT [facility]  . MELANOMA EXCISION  07-12-10   RIGHT NECK  . PROSTATE BIOPSY  07/20/2011   Procedure: BIOPSY TRANSRECTAL ULTRASONIC PROSTATE (TUBP);  Surgeon: Molli Hazard, MD;  Location[facility];  Service: Urology;  Laterality: N/A;  1 hour requested for this case  Saturation BX  OFFICE TO BRING ULTRASOUND MACHINE    . ROBOT ASSISTED LAPAROSCOPIC RADICAL PROSTATECTOMY  10/19/2011   Procedure: ROBOTIC ASSISTED LAPAROSCOPIC RADICAL PROSTATECTOMY;  Surgeon: Molli Hazard, MD;  Location: [facility];  Service: Urology;  Laterality: N/A;      . SHOULDER OPEN ROTATOR CUFF REPAIR  07-29-2010- RIGHT   AND SUBACROMIAL DECOMPRESSION AROMIOPLASTY  . TONSILLECTOMY  AGE 34   Social History   Social History  . Marital Status: Married    Spouse Name: N/A  . Number of Children: 2   Occupational History  . Retired     Public relations account executive   Social History Main Topics  . Smoking status: Former Smoker -- 1.00 packs/day for 43 years    Types: Cigarettes    Quit date: 12/07/2007  . Smokeless tobacco: Never Used  . Alcohol Use: Yes     Comment: OCCASIONAL  . Drug Use: No   Current Outpatient Medications on File Prior to Visit  Medication Sig Dispense Refill  . atorvastatin (LIPITOR) 10 MG tablet Take 10 mg by mouth daily with breakfast.     . Blood Glucose Monitoring Suppl (ONE TOUCH  ULTRA 2) w/Device KIT Use to check blood sugar 2 times per day. 1 each 2  . calcium carbonate (TUMS - DOSED IN MG ELEMENTAL CALCIUM) 500 MG chewable tablet Chew 1 tablet (200 mg of elemental calcium total) by mouth 2 (two) times daily as needed for indigestion or heartburn. 30 tablet 0  . glipiZIDE (GLUCOTROL XL) 5 MG 24 hr tablet Take 2 tablets (10 mg total) by mouth daily with breakfast. 180 tablet 3  . glucose blood (ONE TOUCH ULTRA TEST) test strip Use to check blood sugar 2 times per day. 200 each 5  . HYDROcodone-acetaminophen (NORCO/VICODIN) 5-325 MG tablet Take 1-2 tablets by mouth every 4 (four) hours as needed. 90 tablet 0  .  hydrOXYzine (ATARAX/VISTARIL) 10 MG tablet TAKE 1 TABLET BY MOUTH EVERY 6 HOURS AS NEEDED (Patient taking differently: Take 10 mg by mouth daily as needed for itching (water retention). ) 60 tablet 0  . lidocaine-prilocaine (EMLA) cream Apply 1 application topically as needed. Apply to port before chemotherapy. 30 g 0  . loperamide (IMODIUM A-D) 2 MG tablet Take 2 mg by mouth 4 (four) times daily as needed for diarrhea or loose stools.    . Multiple Vitamin (MULTIVITAMIN) tablet Take 1 tablet by mouth daily.    . naproxen sodium (ALEVE) 220 MG tablet Take 440 mg by mouth daily as needed (pain).     [name] omeprazole (PRILOSEC) 20 MG capsule Take 20 mg by mouth every morning.    [name] DELICA LANCETS 25O MISC Use to check blood sugar 2 times per day. 200 each 2  . prochlorperazine (COMPAZINE) 10 MG tablet Take 1 tablet (10 mg total) by mouth every 6 (six) hours as needed for nausea or vomiting. 30 tablet 0  . pseudoephedrine-acetaminophen (TYLENOL SINUS) 30-500 MG TABS tablet Take 1 tablet by mouth daily as needed (congestion).     . sitaGLIPtin (JANUVIA) 100 MG tablet Take 1 tablet (100 mg total) by mouth daily. 90 tablet 3   No current facility-administered medications on file prior to visit.    Allergies  Allergen Reactions  . Scallops [Shellfish Allergy] Nausea  And Vomiting  . Cephalexin Hives and Rash   Family History  Problem Relation Age of Onset  . Cancer Mother        breast/lived 53 years post  . Alzheimer's disease Mother        deceased  . Cancer Brother        thyroid  . Heart disease Brother   . Stroke Father        63 years old  . Hypertension Father   . Alcoholism Father   . CVA Father   . Cirrhosis Paternal Grandfather        alcohol-related  . Cancer Paternal Grandmother        unknown type    PE: BP 120/60   Pulse 85   Ht '5\' 10"'  (1.778 m) Comment: measured  Wt 176 lb (79.8 kg)   SpO2 96%   BMI 25.25 kg/m  Wt Readings from Last 3 Encounters:  05/10/18 176 lb (79.8 kg)  04/17/18 173 lb 1.6 oz (78.5 kg)  04/16/18 176 lb (79.8 kg)   Constitutional: overweight, in NAD Eyes: PERRLA, EOMI, no exophthalmos ENT: moist mucous membranes, no thyromegaly, no cervical lymphadenopathy Cardiovascular: RRR, No MRG Respiratory: CTA B Gastrointestinal: abdomen soft, NT, ND, BS+ Musculoskeletal: no deformities, strength intact in all 4 Skin: moist, warm, no rashes Neurological: no tremor with outstretched hands, DTR normal in all 4   ASSESSMENT: 1. DM2, non-insulin-dependent, now more controlled, with complications - PN  2. HL   3. Obesity  PLAN:  1. Patient with longstanding, previously more uncontrolled diabetes, with better control in the last 2 years, but again high sugars after he started chemotherapy with dexamethasone.  He continues on glipizide and Januvia.  Januvia was expensive, but more recently he could get it with approximately $150. -We reviewed his sugars at home.  He did not check many times, but whenever he checked these were elevated.  Sugars were closer to goal in the morning and increasing as the day went by. -We discussed about options for treatment.  Due to weight loss, I would not suggest  to add back metformin, or to start him on GLP-1 receptor agonist or SGLT2 inhibitors.  I suggested insulin.   Probably the best option for him would be NPH in the morning, once a day.  However, he would like to stay off insulin for now if the sugars remain stable due to the poor prognosis from his pancreatic cancer.  I agree with him, but I advised him to start checking sugars once a day, rotating check times.  If the sugars start increasing, we can add NPH then.  He agrees with a plan. - I suggested to:  Patient Instructions  Please continue:  - Januvia 100 mg before b'fast - Glipizide ER 10 mg before b'fast  Please check sugars 1x a day, at different times of the day.  If your sugars increase, I would recommend NPH once a day in am.  Please return in 4 months with your sugar log.   - today, HbA1c is 8% (higher) - continue checking sugars at different times of the day - check 1x a day, rotating checks - advised for yearly eye exams >> he is UTD - Return to clinic in 4 mo with sugar log     2. HL - Reviewed latest lipid panel from 09/2017: all lipid fractions at goal - Continues Lipitor without side effects.  Philemon Kingdom, MD PhD [facility] Endocrinology

## 2018-05-11 ENCOUNTER — Other Ambulatory Visit: Payer: Self-pay | Admitting: Internal Medicine

## 2018-05-16 ENCOUNTER — Telehealth: Payer: Self-pay

## 2018-05-16 ENCOUNTER — Inpatient Hospital Stay: Payer: PPO

## 2018-05-16 ENCOUNTER — Inpatient Hospital Stay: Payer: PPO | Admitting: Oncology

## 2018-05-16 ENCOUNTER — Inpatient Hospital Stay: Payer: PPO | Attending: Oncology

## 2018-05-16 VITALS — BP 134/66 | HR 89 | Temp 97.9°F | Resp 18 | Ht 70.0 in | Wt 176.2 lb

## 2018-05-16 DIAGNOSIS — C61 Malignant neoplasm of prostate: Secondary | ICD-10-CM

## 2018-05-16 DIAGNOSIS — R109 Unspecified abdominal pain: Secondary | ICD-10-CM

## 2018-05-16 DIAGNOSIS — Z79899 Other long term (current) drug therapy: Secondary | ICD-10-CM

## 2018-05-16 DIAGNOSIS — E1142 Type 2 diabetes mellitus with diabetic polyneuropathy: Secondary | ICD-10-CM | POA: Insufficient documentation

## 2018-05-16 DIAGNOSIS — D649 Anemia, unspecified: Secondary | ICD-10-CM | POA: Insufficient documentation

## 2018-05-16 DIAGNOSIS — M545 Low back pain: Secondary | ICD-10-CM | POA: Diagnosis not present

## 2018-05-16 DIAGNOSIS — R0981 Nasal congestion: Secondary | ICD-10-CM | POA: Diagnosis not present

## 2018-05-16 DIAGNOSIS — C787 Secondary malignant neoplasm of liver and intrahepatic bile duct: Secondary | ICD-10-CM | POA: Insufficient documentation

## 2018-05-16 DIAGNOSIS — Z5111 Encounter for antineoplastic chemotherapy: Secondary | ICD-10-CM | POA: Diagnosis not present

## 2018-05-16 DIAGNOSIS — R5383 Other fatigue: Secondary | ICD-10-CM | POA: Diagnosis not present

## 2018-05-16 DIAGNOSIS — R05 Cough: Secondary | ICD-10-CM | POA: Insufficient documentation

## 2018-05-16 DIAGNOSIS — C786 Secondary malignant neoplasm of retroperitoneum and peritoneum: Secondary | ICD-10-CM | POA: Insufficient documentation

## 2018-05-16 DIAGNOSIS — Z7984 Long term (current) use of oral hypoglycemic drugs: Secondary | ICD-10-CM | POA: Diagnosis not present

## 2018-05-16 DIAGNOSIS — Z95828 Presence of other vascular implants and grafts: Secondary | ICD-10-CM

## 2018-05-16 DIAGNOSIS — R0982 Postnasal drip: Secondary | ICD-10-CM | POA: Insufficient documentation

## 2018-05-16 DIAGNOSIS — K5900 Constipation, unspecified: Secondary | ICD-10-CM | POA: Insufficient documentation

## 2018-05-16 DIAGNOSIS — G893 Neoplasm related pain (acute) (chronic): Secondary | ICD-10-CM | POA: Insufficient documentation

## 2018-05-16 LAB — CBC WITH DIFFERENTIAL (CANCER CENTER ONLY)
Abs Immature Granulocytes: 0.09 10*3/uL — ABNORMAL HIGH (ref 0.00–0.07)
Basophils Absolute: 0 10*3/uL (ref 0.0–0.1)
Basophils Relative: 0 %
Eosinophils Absolute: 0 10*3/uL (ref 0.0–0.5)
Eosinophils Relative: 0 %
HEMATOCRIT: 29.6 % — AB (ref 39.0–52.0)
Hemoglobin: 9.5 g/dL — ABNORMAL LOW (ref 13.0–17.0)
Immature Granulocytes: 1 %
Lymphocytes Relative: 10 %
Lymphs Abs: 0.9 10*3/uL (ref 0.7–4.0)
MCH: 28.5 pg (ref 26.0–34.0)
MCHC: 32.1 g/dL (ref 30.0–36.0)
MCV: 88.9 fL (ref 80.0–100.0)
MONO ABS: 1 10*3/uL (ref 0.1–1.0)
Monocytes Relative: 11 %
Neutro Abs: 7.4 10*3/uL (ref 1.7–7.7)
Neutrophils Relative %: 78 %
Platelet Count: 426 10*3/uL — ABNORMAL HIGH (ref 150–400)
RBC: 3.33 MIL/uL — ABNORMAL LOW (ref 4.22–5.81)
RDW: 13.9 % (ref 11.5–15.5)
WBC Count: 9.5 10*3/uL (ref 4.0–10.5)
nRBC: 0 % (ref 0.0–0.2)

## 2018-05-16 LAB — CMP (CANCER CENTER ONLY)
ALT: 11 U/L (ref 0–44)
AST: 16 U/L (ref 15–41)
Albumin: 3 g/dL — ABNORMAL LOW (ref 3.5–5.0)
Alkaline Phosphatase: 66 U/L (ref 38–126)
Anion gap: 9 (ref 5–15)
BUN: 17 mg/dL (ref 8–23)
CO2: 25 mmol/L (ref 22–32)
Calcium: 9.2 mg/dL (ref 8.9–10.3)
Chloride: 104 mmol/L (ref 98–111)
Creatinine: 0.88 mg/dL (ref 0.61–1.24)
GFR, Est AFR Am: >60 mL/min (ref >60)
GFR, Estimated: >60 mL/min (ref >60)
Glucose, Bld: 140 mg/dL — ABNORMAL HIGH (ref 70–99)
Potassium: 3.9 mmol/L (ref 3.5–5.1)
Sodium: 138 mmol/L (ref 135–145)
Total Bilirubin: 0.3 mg/dL (ref 0.3–1.2)
Total Protein: 7.3 g/dL (ref 6.5–8.1)

## 2018-05-16 MED ORDER — SODIUM CHLORIDE 0.9 % IV SOLN
20.1000 mg/m2 | Freq: Once | INTRAVENOUS | Status: AC
  Administered 2018-05-16: 40 mg via INTRAVENOUS
  Filled 2018-05-16: qty 4

## 2018-05-16 MED ORDER — SODIUM CHLORIDE 0.9% FLUSH
10.0000 mL | INTRAVENOUS | Status: DC | PRN
  Administered 2018-05-16: 10 mL
  Filled 2018-05-16: qty 10

## 2018-05-16 MED ORDER — FAMOTIDINE IN NACL 20-0.9 MG/50ML-% IV SOLN
INTRAVENOUS | Status: AC
  Filled 2018-05-16: qty 50

## 2018-05-16 MED ORDER — FAMOTIDINE IN NACL 20-0.9 MG/50ML-% IV SOLN
20.0000 mg | Freq: Once | INTRAVENOUS | Status: AC
  Administered 2018-05-16: 20 mg via INTRAVENOUS

## 2018-05-16 MED ORDER — SODIUM CHLORIDE 0.9 % IV SOLN
10.0000 mg | Freq: Once | INTRAVENOUS | Status: AC
  Administered 2018-05-16: 10 mg via INTRAVENOUS
  Filled 2018-05-16: qty 1

## 2018-05-16 MED ORDER — SODIUM CHLORIDE 0.9 % IV SOLN
Freq: Once | INTRAVENOUS | Status: AC
  Administered 2018-05-16: 14:00:00 via INTRAVENOUS
  Filled 2018-05-16: qty 250

## 2018-05-16 MED ORDER — DIPHENHYDRAMINE HCL 50 MG/ML IJ SOLN
25.0000 mg | Freq: Once | INTRAMUSCULAR | Status: AC
  Administered 2018-05-16: 25 mg via INTRAVENOUS

## 2018-05-16 MED ORDER — HEPARIN SOD (PORK) LOCK FLUSH 100 UNIT/ML IV SOLN
500.0000 [IU] | Freq: Once | INTRAVENOUS | Status: DC | PRN
  Filled 2018-05-16: qty 5

## 2018-05-16 MED ORDER — SODIUM CHLORIDE 0.9% FLUSH
10.0000 mL | INTRAVENOUS | Status: DC | PRN
  Administered 2018-05-16: 10 mL via INTRAVENOUS
  Filled 2018-05-16: qty 10

## 2018-05-16 MED ORDER — DIPHENHYDRAMINE HCL 50 MG/ML IJ SOLN
INTRAMUSCULAR | Status: AC
  Filled 2018-05-16: qty 1

## 2018-05-16 NOTE — Progress Notes (Signed)
Hematology and Oncology Follow Up Visit  STANTON KISSOON [id number] Jul 03, 1945 73 y.o. 05/16/2018 1:19 PM [name], [name], MD   Principle Diagnosis: 73 year old man with prostate adenocarcinoma diagnosed in 2013 subsequently developed poorly differentiated tumor with small cell features June 2018.  He has documented metastatic disease to the peritoneum.  Prior Therapy:  He is status post robotic-assisted laparoscopic radical prostatectomy and extended lymphadenectomy with the pathology reveals T2N0.  completed in June 2013.  He developed recurrent disease in 2017 and was treated with Taxotere chemotherapy at 75 mg/m every 3 weeks started on 03/30/2016. He is status post 9 cycles of therapy.   He developed relapsed disease and a biopsy proven to show neuroendocrine poorly differentiated tumor in June 2018. He is S/P Carboplatin and etoposide. Cycle 1 given on 10/24/2016. He completed cycle 6 on February 13, 2017 with near complete response.  He developed a relapse disease in 2019.  Carboplatin and paclitaxel cycle 1 given on 10/17/2017.  He completed 6 cycles of therapy in October 2019.   Current therapy: Jevtana chemotherapy started on 04/24/2018.  He is here for cycle 2 of therapy.  Interim History: [name] is here for a follow-up.  Since the last visit, he received the first cycle of Jevtana chemotherapy without any major complications.  He denies any nausea, vomiting or infusion related complications.  He does have grade 1 peripheral neuropathy which is unchanged.  He still dealing with sinus congestion postnasal drip associated with cough that is productive at times.  He denies any fevers or shortness of breath.  His appetite remains reasonable and his performance status has declined slightly.  He continues to have issues with abdominal discomfort that is manageable with hydrocodone.  He is taking hydrocodone 2-3 times a day at most.  He does not report any headaches, blurry  vision, syncope or seizures.  He denied any dizziness or confusion.  He does not report any fevers, chills, sweats.  He does not report any chest pain, palpitation, orthopnea or leg edema. He does not report any cough, wheezing or hemoptysis. He does not report any changes in bowel habits.  He  does not report any frequency, urgency or hematuria. He does not report any bone pain or pathological fractures.  He denies any bleeding or clotting tendency.  Denies any changes in his mood.  Remaining review of systems is negative.   Medications: I have reviewed the patient's current medications.  Current Outpatient Medications  Medication Sig Dispense Refill  . atorvastatin (LIPITOR) 10 MG tablet Take 10 mg by mouth daily with breakfast.     . Blood Glucose Monitoring Suppl (ONE TOUCH ULTRA 2) w/Device KIT Use to check blood sugar 2 times per day. 1 each 2  . calcium carbonate (TUMS - DOSED IN MG ELEMENTAL CALCIUM) 500 MG chewable tablet Chew 1 tablet (200 mg of elemental calcium total) by mouth 2 (two) times daily as needed for indigestion or heartburn. 30 tablet 0  . glipiZIDE (GLUCOTROL XL) 5 MG 24 hr tablet Take 2 tablets (10 mg total) by mouth daily with breakfast. 180 tablet 3  . glucose blood (ONE TOUCH ULTRA TEST) test strip Use to check blood sugar 2 times per day. 200 each 5  . HYDROcodone-acetaminophen (NORCO/VICODIN) 5-325 MG tablet Take 1-2 tablets by mouth every 4 (four) hours as needed. 90 tablet 0  . hydrOXYzine (ATARAX/VISTARIL) 10 MG tablet TAKE 1 TABLET BY MOUTH EVERY 6 HOURS AS NEEDED (Patient taking differently: Take 10 mg  by mouth daily as needed for itching (water retention). ) 60 tablet 0  . JANUVIA 100 MG tablet Take 1 tablet by mouth daily 90 tablet 2  . lidocaine-prilocaine (EMLA) cream Apply 1 application topically as needed. Apply to port before chemotherapy. 30 g 0  . loperamide (IMODIUM A-D) 2 MG tablet Take 2 mg by mouth 4 (four) times daily as needed for diarrhea or loose  stools.    . Multiple Vitamin (MULTIVITAMIN) tablet Take 1 tablet by mouth daily.    . naproxen sodium (ALEVE) 220 MG tablet Take 440 mg by mouth daily as needed (pain).     [name] omeprazole (PRILOSEC) 20 MG capsule Take 20 mg by mouth every morning.    [name] DELICA LANCETS 08M MISC Use to check blood sugar 2 times per day. 200 each 2  . prochlorperazine (COMPAZINE) 10 MG tablet Take 1 tablet (10 mg total) by mouth every 6 (six) hours as needed for nausea or vomiting. 30 tablet 0  . pseudoephedrine-acetaminophen (TYLENOL SINUS) 30-500 MG TABS tablet Take 1 tablet by mouth daily as needed (congestion).      No current facility-administered medications for this visit.    Facility-Administered Medications Ordered in Other Visits  Medication Dose Route Frequency Provider Last Rate Last Dose  . sodium chloride flush (NS) 0.9 % injection 10 mL  10 mL Intravenous PRN Wyatt Portela, MD   10 mL at 05/16/18 1310     Allergies:  Allergies  Allergen Reactions  . Scallops [Shellfish Allergy] Nausea And Vomiting  . Cephalexin Hives and Rash    Past Medical History, Surgical history, Social history, and Family History updated and remain unchanged.   Physical Exam:   Blood pressure 134/66, pulse 89, temperature 97.9 F (36.6 C), temperature source Oral, resp. rate 18, height '5\' 10"'  (1.778 m), weight 176 lb 3.2 oz (79.9 kg), SpO2 100 %.    ECOG: 1    General appearance: Comfortable appearing without any discomfort Head: Normocephalic without any trauma Oropharynx: Mucous membranes are moist and pink without any thrush or ulcers. Eyes: Pupils are equal and round reactive to light. Lymph nodes: No cervical, supraclavicular, inguinal or axillary lymphadenopathy.   Heart:regular rate and rhythm.  S1 and S2 without leg edema. Lung: Clear without any rhonchi or wheezes.  No dullness to percussion. Abdomin: Soft, nontender, nondistended with good bowel sounds.  No  hepatosplenomegaly. Musculoskeletal: No joint deformity or effusion.  Full range of motion noted. Neurological: No deficits noted on motor, sensory and deep tendon reflex exam. Skin: No petechial rash or dryness.  Appeared moist.          Lab Results: Lab Results  Component Value Date   WBC 6.2 04/24/2018   HGB 10.1 (L) 04/24/2018   HCT 31.4 (L) 04/24/2018   MCV 90.8 04/24/2018   PLT 270 04/24/2018     Chemistry      Component Value Date/Time   NA 140 04/24/2018 1140   NA 140 101/16/2018 1136   K 4.3 04/24/2018 1140   K 3.9 101/16/2018 1136   CL 105 04/24/2018 1140   CO2 26 04/24/2018 1140   CO2 25 101/16/2018 1136   BUN 19 04/24/2018 1140   BUN 15.8 101/16/2018 1136   CREATININE 0.98 04/24/2018 1140   CREATININE 0.9 101/16/2018 1136   GLU 155 08/24/2015      Component Value Date/Time   CALCIUM 9.3 04/24/2018 1140   CALCIUM 9.2 101/16/2018 1136   ALKPHOS 57 04/24/2018 1140  ALKPHOS 55 120-Nov-202018 1136   AST 20 04/24/2018 1140   AST 19 120-Nov-202018 1136   ALT 15 04/24/2018 1140   ALT 15 120-Nov-202018 1136   BILITOT 0.3 04/24/2018 1140   BILITOT 0.32 120-Nov-202018 1136      IMPRESSION: 1. Since the prior CT, numerous small liver lesions have developed consistent with metastatic disease. 2. There is a trace amount of ascites with this increased opacity in the mesentery and along the omentum. Small ill-defined omental and mesenteric nodules are noted. Peritoneal carcinomatosis is suspected. 3. Small right and trace left pleural effusions. Nodularity noted along the right oblique fissure suspicious for lymphangitic spread of carcinoma. Lung base findings are stable from the prior CT. 4. No acute abnormalities.  No convincing colitis or enteritis. 5. Aortic atherosclerosis.   Impression and Plan:   73 year old man with:  1.  Prostate cancer diagnosed in 2013 subsequently developed advanced disease with neuroendocrine features in 2018.  He has peritoneal metastasis  and no hepatic lesions.   He is currently receiving Jevtana chemotherapy and received the first cycle on 04/24/2018.  Risks and benefits of continuing this therapy was discussed today.  Complication associated with this treatment long-term include nausea, vomiting as well as worsening neuropathy was reviewed.  Plan is to proceed with cycle 2 today and cycle 3 in 3 weeks.  Imaging study will be repeated after that.  He is also getting a second opinion at [facility] in the end of January.  2.  Abdominal pain: Related to malignancy.  He is currently receiving hydrocodone as needed which is managing his pain adequately.  He uses 2-3 times a day at most.   3. IV access: Port-A-Cath remains in use without any recent complications.     4. Anemia: Hemoglobin slightly down but otherwise does not require any transfusion.  5.  Prognosis: Therapy remains palliative and performance status is adequate at this time.  I recommended continue aggressive measures.  6.  Neuropathy: We will continue to monitor closely with Jevtana chemotherapy.  7.  Antiemetics: No nausea or vomiting and Compazine is available to him.  8.  Neutropenia prophylaxis: Growth factor support will be used in the future if he develops any neutropenia.  His absolute neutrophil count appears adequate.  9. Follow-up: Will be 3 weeks for the next cycle of therapy.  25  minutes was spent with the patient face-to-face today.  More than 50% of time was dedicated to discussing his disease status, treatment options and dealing with complications related to therapy.  Zola Button, MD 1/8/20201:19 PM

## 2018-05-16 NOTE — Telephone Encounter (Signed)
Error opening  

## 2018-05-16 NOTE — Patient Instructions (Signed)
[facility] Discharge Instructions for Patients Receiving Chemotherapy  Today you received the following chemotherapy agent: Jevtana (Cabazitaxel)  To help prevent nausea and vomiting after your treatment, we encourage you to take your nausea medication as prescribed   If you develop nausea and vomiting that is not controlled by your nausea medication, call the clinic.   BELOW ARE SYMPTOMS THAT SHOULD BE REPORTED IMMEDIATELY:  *FEVER GREATER THAN 100.5 F  *CHILLS WITH OR WITHOUT FEVER  NAUSEA AND VOMITING THAT IS NOT CONTROLLED WITH YOUR NAUSEA MEDICATION  *UNUSUAL SHORTNESS OF BREATH  *UNUSUAL BRUISING OR BLEEDING  TENDERNESS IN MOUTH AND THROAT WITH OR WITHOUT PRESENCE OF ULCERS  *URINARY PROBLEMS  *BOWEL PROBLEMS  UNUSUAL RASH Items with * indicate a potential emergency and should be followed up as soon as possible.  Feel free to call the clinic should you have any questions or concerns. The clinic phone number is [phone].  Please show the [facility] at check-in to the Emergency Department and triage nurse.

## 2018-05-16 NOTE — Telephone Encounter (Signed)
rinted avs and calender of upcoming appointment. Per 1/8 los

## 2018-05-20 ENCOUNTER — Other Ambulatory Visit: Payer: Self-pay | Admitting: Oncology

## 2018-05-21 ENCOUNTER — Telehealth: Payer: Self-pay

## 2018-05-21 NOTE — Telephone Encounter (Signed)
Received call from patient following up regarding appointment at [facility] this month. He stated that he has not heard anything from [facility] and has not received a packet in the mail. This RN contacted Malachy Mood at [facility], she confirmed appt on 1/27, all information received, and stated that the patient information packet will be mailed out this week. Contacted patient and made aware of above information. He verbalized understanding and had no other questions or concerns.

## 2018-05-23 ENCOUNTER — Other Ambulatory Visit: Payer: Self-pay | Admitting: Oncology

## 2018-05-23 MED ORDER — HYDROCODONE-ACETAMINOPHEN 5-325 MG PO TABS: 1.0000 | ORAL_TABLET | ORAL | 0 refills | Status: DC | PRN

## 2018-06-04 DIAGNOSIS — C61 Malignant neoplasm of prostate: Secondary | ICD-10-CM | POA: Diagnosis not present

## 2018-06-04 DIAGNOSIS — C786 Secondary malignant neoplasm of retroperitoneum and peritoneum: Secondary | ICD-10-CM | POA: Diagnosis not present

## 2018-06-04 DIAGNOSIS — C787 Secondary malignant neoplasm of liver and intrahepatic bile duct: Secondary | ICD-10-CM | POA: Diagnosis not present

## 2018-06-04 DIAGNOSIS — C801 Malignant (primary) neoplasm, unspecified: Secondary | ICD-10-CM | POA: Diagnosis not present

## 2018-06-05 ENCOUNTER — Telehealth: Payer: Self-pay | Admitting: *Deleted

## 2018-06-05 ENCOUNTER — Encounter: Payer: Self-pay | Admitting: Internal Medicine

## 2018-06-05 ENCOUNTER — Ambulatory Visit: Payer: PPO

## 2018-06-05 ENCOUNTER — Inpatient Hospital Stay: Payer: PPO | Admitting: Oncology

## 2018-06-05 ENCOUNTER — Inpatient Hospital Stay: Payer: PPO

## 2018-06-05 VITALS — BP 136/78 | HR 110 | Temp 97.9°F | Resp 18 | Ht 70.0 in | Wt 179.1 lb

## 2018-06-05 DIAGNOSIS — C787 Secondary malignant neoplasm of liver and intrahepatic bile duct: Secondary | ICD-10-CM | POA: Diagnosis not present

## 2018-06-05 DIAGNOSIS — C61 Malignant neoplasm of prostate: Secondary | ICD-10-CM

## 2018-06-05 DIAGNOSIS — Z79899 Other long term (current) drug therapy: Secondary | ICD-10-CM

## 2018-06-05 DIAGNOSIS — G893 Neoplasm related pain (acute) (chronic): Secondary | ICD-10-CM | POA: Diagnosis not present

## 2018-06-05 DIAGNOSIS — Z7984 Long term (current) use of oral hypoglycemic drugs: Secondary | ICD-10-CM | POA: Diagnosis not present

## 2018-06-05 DIAGNOSIS — D649 Anemia, unspecified: Secondary | ICD-10-CM | POA: Diagnosis not present

## 2018-06-05 DIAGNOSIS — M545 Low back pain: Secondary | ICD-10-CM

## 2018-06-05 DIAGNOSIS — K5900 Constipation, unspecified: Secondary | ICD-10-CM | POA: Diagnosis not present

## 2018-06-05 DIAGNOSIS — C786 Secondary malignant neoplasm of retroperitoneum and peritoneum: Secondary | ICD-10-CM

## 2018-06-05 DIAGNOSIS — R5383 Other fatigue: Secondary | ICD-10-CM

## 2018-06-05 DIAGNOSIS — Z95828 Presence of other vascular implants and grafts: Secondary | ICD-10-CM

## 2018-06-05 DIAGNOSIS — E1142 Type 2 diabetes mellitus with diabetic polyneuropathy: Secondary | ICD-10-CM

## 2018-06-05 DIAGNOSIS — R109 Unspecified abdominal pain: Secondary | ICD-10-CM | POA: Diagnosis not present

## 2018-06-05 LAB — CBC WITH DIFFERENTIAL (CANCER CENTER ONLY)
Abs Immature Granulocytes: 0.11 10*3/uL — ABNORMAL HIGH (ref 0.00–0.07)
Basophils Absolute: 0.1 10*3/uL (ref 0.0–0.1)
Basophils Relative: 0 %
Eosinophils Absolute: 0 10*3/uL (ref 0.0–0.5)
Eosinophils Relative: 0 %
HCT: 28 % — ABNORMAL LOW (ref 39.0–52.0)
Hemoglobin: 8.9 g/dL — ABNORMAL LOW (ref 13.0–17.0)
Immature Granulocytes: 1 %
Lymphocytes Relative: 8 %
Lymphs Abs: 0.9 10*3/uL (ref 0.7–4.0)
MCH: 28 pg (ref 26.0–34.0)
MCHC: 31.8 g/dL (ref 30.0–36.0)
MCV: 88.1 fL (ref 80.0–100.0)
Monocytes Absolute: 1.4 10*3/uL — ABNORMAL HIGH (ref 0.1–1.0)
Monocytes Relative: 13 %
NEUTROS ABS: 8.9 10*3/uL — AB (ref 1.7–7.7)
NEUTROS PCT: 78 %
PLATELETS: 539 10*3/uL — AB (ref 150–400)
RBC: 3.18 MIL/uL — ABNORMAL LOW (ref 4.22–5.81)
RDW: 15 % (ref 11.5–15.5)
WBC Count: 11.4 10*3/uL — ABNORMAL HIGH (ref 4.0–10.5)
nRBC: 0.2 % (ref 0.0–0.2)

## 2018-06-05 LAB — CMP (CANCER CENTER ONLY)
ALT: 11 U/L (ref 0–44)
AST: 21 U/L (ref 15–41)
Albumin: 2.6 g/dL — ABNORMAL LOW (ref 3.5–5.0)
Alkaline Phosphatase: 72 U/L (ref 38–126)
Anion gap: 15 (ref 5–15)
BUN: 32 mg/dL — ABNORMAL HIGH (ref 8–23)
CHLORIDE: 98 mmol/L (ref 98–111)
CO2: 23 mmol/L (ref 22–32)
Calcium: 8.9 mg/dL (ref 8.9–10.3)
Creatinine: 1.47 mg/dL — ABNORMAL HIGH (ref 0.61–1.24)
GFR, EST AFRICAN AMERICAN: 54 mL/min — AB (ref >60)
GFR, EST NON AFRICAN AMERICAN: 47 mL/min — AB (ref >60)
Glucose, Bld: 186 mg/dL — ABNORMAL HIGH (ref 70–99)
Potassium: 4 mmol/L (ref 3.5–5.1)
Sodium: 136 mmol/L (ref 135–145)
Total Bilirubin: 0.4 mg/dL (ref 0.3–1.2)
Total Protein: 6.8 g/dL (ref 6.5–8.1)

## 2018-06-05 MED ORDER — HEPARIN SOD (PORK) LOCK FLUSH 100 UNIT/ML IV SOLN
500.0000 [IU] | Freq: Once | INTRAVENOUS | Status: AC | PRN
  Administered 2018-06-05: 500 [IU] via INTRAVENOUS
  Filled 2018-06-05: qty 5

## 2018-06-05 MED ORDER — HYDROCODONE-ACETAMINOPHEN 5-325 MG PO TABS: 1.0000 | ORAL_TABLET | ORAL | 0 refills | Status: AC | PRN

## 2018-06-05 MED ORDER — SODIUM CHLORIDE 0.9% FLUSH
10.0000 mL | INTRAVENOUS | Status: DC | PRN
  Administered 2018-06-05: 10 mL via INTRAVENOUS
  Filled 2018-06-05: qty 10

## 2018-06-05 MED ORDER — SENNOSIDES-DOCUSATE SODIUM 8.6-50 MG PO TABS: 1.0000 | ORAL_TABLET | Freq: Two times a day (BID) | ORAL | 3 refills | Status: AC

## 2018-06-05 MED ORDER — MORPHINE SULFATE ER 30 MG PO TBCR: 30.0000 mg | EXTENDED_RELEASE_TABLET | Freq: Two times a day (BID) | ORAL | 0 refills | Status: AC

## 2018-06-05 MED ORDER — MAGNESIUM CITRATE PO SOLN: 1.0000 | Freq: Once | ORAL | 0 refills | Status: AC

## 2018-06-05 NOTE — Telephone Encounter (Signed)
Per dr Alen Blew, cancelled chemo treatment today and sent hospice referral. Spoke with hospice nurse, in [place]. Dr Alen Blew to be the attending, hospice physicians may do symptom management, and have DNR signed, per families wishes.

## 2018-06-05 NOTE — Progress Notes (Signed)
Hematology and Oncology Follow Up Visit  Frank Larsen [id number] 1945/05/14 73 y.o. 06/05/2018 8:27 AM Frank Larsen, Frank Brow, MD   Principle Diagnosis: 73 year old man with poorly differentiated carcinoma arising prostate adenocarcinoma documented in June 2018.  He has disease to the peritoneum and liver arising from a prostate primary that was diagnosed in 2013.  Prior Therapy:  He is status post robotic-assisted laparoscopic radical prostatectomy and extended lymphadenectomy with the pathology reveals T2N0.  completed in June 2013.  He developed recurrent disease in 2017 and was treated with Taxotere chemotherapy at 75 mg/m every 3 weeks started on 03/30/2016. He is status post 9 cycles of therapy.   He developed relapsed disease and a biopsy proven to show neuroendocrine poorly differentiated tumor in June 2018. He is S/P Carboplatin and etoposide. Cycle 1 given on 10/24/2016. He completed cycle 6 on February 13, 2017 with near complete response.  He developed a relapse disease in 2019.  Carboplatin and paclitaxel cycle 1 given on 10/17/2017.  He completed 6 cycles of therapy in October 2019.   Current therapy: Jevtana chemotherapy started on 04/24/2018.  He is here for cycle 3 of therapy.  Interim History: Frank Larsen returns today for a repeat evaluation.  Since the last visit, he continues to experience further decline in his performance status and quality of life.  He is spending more time in bed with very limited mobility.  He has worsening abdominal pain and has used hydrocodone more frequently at this time.  He denies any nausea or vomiting.  He does report constipation.  He was evaluated by Dr. Iona Beard for a second opinion and hospice was recommended.  His quality of life continues to decline at this time.  His pain is predominantly abdomen and lower back.  He still ambulating short distances but with excessive fatigue and tiredness.  He does not report any headaches, blurry  vision, syncope or seizures.  He denied any alteration mental status or confusion.  He does not report any fevers, chills, sweats.  He does not report any chest pain, palpitation, orthopnea or leg edema. He does not report any cough, wheezing or hemoptysis. He does not report any diarrhea, hematochezia or melena.  He  does not report any frequency, urgency or hematuria. He does not report any arthralgias or myalgias.  He denies any ecchymosis or bleeding tendency.  Denies any anxiety or depression.  Remaining review of systems is negative.   Medications: I have reviewed the patient's current medications.  Current Outpatient Medications  Medication Sig Dispense Refill  . atorvastatin (LIPITOR) 10 MG tablet Take 10 mg by mouth daily with breakfast.     . Blood Glucose Monitoring Suppl (ONE TOUCH ULTRA 2) w/Device KIT Use to check blood sugar 2 times per day. 1 each 2  . calcium carbonate (TUMS - DOSED IN MG ELEMENTAL CALCIUM) 500 MG chewable tablet Chew 1 tablet (200 mg of elemental calcium total) by mouth 2 (two) times daily as needed for indigestion or heartburn. 30 tablet 0  . glipiZIDE (GLUCOTROL XL) 5 MG 24 hr tablet Take 2 tablets (10 mg total) by mouth daily with breakfast. 180 tablet 3  . glucose blood (ONE TOUCH ULTRA TEST) test strip Use to check blood sugar 2 times per day. 200 each 5  . HYDROcodone-acetaminophen (NORCO/VICODIN) 5-325 MG tablet Take 1-2 tablets by mouth every 4 (four) hours as needed. 90 tablet 0  . hydrOXYzine (ATARAX/VISTARIL) 10 MG tablet TAKE 1 TABLET BY MOUTH EVERY 6 HOURS  AS NEEDED 60 tablet 0  . JANUVIA 100 MG tablet Take 1 tablet by mouth daily 90 tablet 2  . lidocaine-prilocaine (EMLA) cream Apply 1 application topically as needed. Apply to port before chemotherapy. 30 g 0  . loperamide (IMODIUM A-D) 2 MG tablet Take 2 mg by mouth 4 (four) times daily as needed for diarrhea or loose stools.    . Multiple Vitamin (MULTIVITAMIN) tablet Take 1 tablet by mouth daily.     . naproxen sodium (ALEVE) 220 MG tablet Take 440 mg by mouth daily as needed (pain).     [name] omeprazole (PRILOSEC) 20 MG capsule Take 20 mg by mouth every morning.    [name] DELICA LANCETS 67H MISC Use to check blood sugar 2 times per day. 200 each 2  . prochlorperazine (COMPAZINE) 10 MG tablet Take 1 tablet (10 mg total) by mouth every 6 (six) hours as needed for nausea or vomiting. 30 tablet 0  . pseudoephedrine-acetaminophen (TYLENOL SINUS) 30-500 MG TABS tablet Take 1 tablet by mouth daily as needed (congestion).      No current facility-administered medications for this visit.    Facility-Administered Medications Ordered in Other Visits  Medication Dose Route Frequency Provider Last Rate Last Dose  . sodium chloride flush (NS) 0.9 % injection 10 mL  10 mL Intravenous PRN Wyatt Portela, MD   10 mL at 06/05/18 4193     Allergies:  Allergies  Allergen Reactions  . Scallops [Shellfish Allergy] Nausea And Vomiting  . Cephalexin Hives and Rash    Past Medical History, Surgical history, Social history, and Family History updated and remain unchanged.   Physical Exam:  Blood pressure 136/78, pulse (!) 110, temperature 97.9 F (36.6 C), temperature source Oral, resp. rate 18, height _0  (1.778 m), weight 179 lb 1.6 oz (81.2 kg), SpO2 99 %.     ECOG: 3   General appearance: Comfortable appearing without any discomfort Head: Normocephalic without any trauma Oropharynx: Mucous membranes are moist and pink without any thrush or ulcers. Eyes: Pupils are equal and round reactive to light. Lymph nodes: No cervical, supraclavicular, inguinal or axillary lymphadenopathy.   Heart:regular rate and rhythm.  S1 and S2 without leg edema. Lung: Clear without any rhonchi or wheezes.  No dullness to percussion. Abdomin: Soft, nontender, nondistended with good bowel sounds.  No hepatosplenomegaly. Musculoskeletal: No joint deformity or effusion.  Full range of motion  noted. Neurological: No deficits noted on motor, sensory and deep tendon reflex exam. Skin: No petechial rash or dryness.  Appeared moist.          Lab Results: Lab Results  Component Value Date   WBC 9.5 05/16/2018   HGB 9.5 (L) 05/16/2018   HCT 29.6 (L) 05/16/2018   MCV 88.9 05/16/2018   PLT 426 (H) 05/16/2018     Chemistry      Component Value Date/Time   NA 138 05/16/2018 1254   NA 140 107-12-202018 1136   K 3.9 05/16/2018 1254   K 3.9 107-12-202018 1136   CL 104 05/16/2018 1254   CO2 25 05/16/2018 1254   CO2 25 107-12-202018 1136   BUN 17 05/16/2018 1254   BUN 15.8 107-12-202018 1136   CREATININE 0.88 05/16/2018 1254   CREATININE 0.9 107-12-202018 1136   GLU 155 08/24/2015      Component Value Date/Time   CALCIUM 9.2 05/16/2018 1254   CALCIUM 9.2 107-12-202018 1136   ALKPHOS 66 05/16/2018 1254   ALKPHOS 55 107-12-202018 1136  AST 16 05/16/2018 1254   AST 19 1June 30, 202018 1136   ALT 11 05/16/2018 1254   ALT 15 1June 30, 202018 1136   BILITOT 0.3 05/16/2018 1254   BILITOT 0.32 1June 30, 202018 1136         Impression and Plan:   73 year old man with:  1.  Advanced prostate cancer with poorly differentiated features indicating differentiation to small cell cancer.     He has progressed on multiple therapies outlined above but currently receiving Jevtana.  His performance status has declined rather rapidly and I doubt he will benefit from any further chemotherapy.  Risks and benefits of discontinuation of chemotherapy and proceeding with hospice was discussed today and he elected to stop chemotherapy.  I recommended proceeding with supportive care management and hospice enrollment.  He is agreeable to proceed and we will make the appropriate referrals for hospice and cancel all chemotherapy appointments.  2.  Abdominal pain: He will require long acting pain medication.  I will start him on morphine 30 mg twice a day and he will continue to use hydrocodone as needed.   3.   Constipation: He will start on aggressive bowel regimen including Senokot S twice a day.  He has not had a bowel movement in 6 days and instructed him to use magnesium citrate in the immediate future and subsequently will use Senokot-S on a regular basis.     4. Anemia: Hemoglobin remains relatively stable without any need for transfusion.  5.  Prognosis and end-of-life issues: This was discussed today in detail.  He understand he has limited life expectancy estimated of less than 3 months.  He has a living will and have made funeral arrangements.  I recommended that he would be DNR if he is to be hospitalized.  He is agreement and he will not receive any aggressive cardiopulmonary resuscitation in case of an arrest.  We have also discussed the natural course of his disease and what anticipated events will occur towards the end of life.  And he understands having hospice involvement would help with that transition and care moving forward.   6.  Follow-up: No scheduled follow-up will be made and will rely on hospice for updates  30  minutes was spent with the patient face-to-face today.  More than 50% of time was dedicated to reviewing his disease status, treatment options, discussing prognosis and end-of-life issues.  Zola Button, MD 1/28/20208:27 AM

## 2018-06-06 ENCOUNTER — Emergency Department (HOSPITAL_COMMUNITY)
Admission: EM | Admit: 2018-06-06 | Discharge: 2018-06-06 | Disposition: A | Discharge status: Home / Self Care | Payer: PPO | Attending: Emergency Medicine | Admitting: Emergency Medicine

## 2018-06-06 ENCOUNTER — Telehealth: Payer: Self-pay | Admitting: *Deleted

## 2018-06-06 ENCOUNTER — Other Ambulatory Visit: Payer: Self-pay

## 2018-06-06 ENCOUNTER — Emergency Department (HOSPITAL_COMMUNITY): Payer: PPO

## 2018-06-06 ENCOUNTER — Encounter (HOSPITAL_COMMUNITY): Payer: Self-pay | Admitting: *Deleted

## 2018-06-06 DIAGNOSIS — R109 Unspecified abdominal pain: Secondary | ICD-10-CM | POA: Diagnosis not present

## 2018-06-06 DIAGNOSIS — R180 Malignant ascites: Secondary | ICD-10-CM | POA: Diagnosis not present

## 2018-06-06 DIAGNOSIS — C7989 Secondary malignant neoplasm of other specified sites: Secondary | ICD-10-CM | POA: Diagnosis not present

## 2018-06-06 DIAGNOSIS — Z8546 Personal history of malignant neoplasm of prostate: Secondary | ICD-10-CM | POA: Insufficient documentation

## 2018-06-06 DIAGNOSIS — Z87891 Personal history of nicotine dependence: Secondary | ICD-10-CM | POA: Insufficient documentation

## 2018-06-06 DIAGNOSIS — Z79899 Other long term (current) drug therapy: Secondary | ICD-10-CM | POA: Insufficient documentation

## 2018-06-06 DIAGNOSIS — C786 Secondary malignant neoplasm of retroperitoneum and peritoneum: Secondary | ICD-10-CM | POA: Diagnosis not present

## 2018-06-06 DIAGNOSIS — J189 Pneumonia, unspecified organism: Secondary | ICD-10-CM

## 2018-06-06 DIAGNOSIS — J168 Pneumonia due to other specified infectious organisms: Secondary | ICD-10-CM | POA: Diagnosis not present

## 2018-06-06 DIAGNOSIS — C772 Secondary and unspecified malignant neoplasm of intra-abdominal lymph nodes: Secondary | ICD-10-CM | POA: Diagnosis not present

## 2018-06-06 DIAGNOSIS — E1122 Type 2 diabetes mellitus with diabetic chronic kidney disease: Secondary | ICD-10-CM | POA: Insufficient documentation

## 2018-06-06 DIAGNOSIS — C787 Secondary malignant neoplasm of liver and intrahepatic bile duct: Secondary | ICD-10-CM | POA: Diagnosis not present

## 2018-06-06 DIAGNOSIS — J181 Lobar pneumonia, unspecified organism: Secondary | ICD-10-CM | POA: Diagnosis not present

## 2018-06-06 DIAGNOSIS — C801 Malignant (primary) neoplasm, unspecified: Secondary | ICD-10-CM | POA: Diagnosis not present

## 2018-06-06 DIAGNOSIS — Z85820 Personal history of malignant melanoma of skin: Secondary | ICD-10-CM | POA: Diagnosis not present

## 2018-06-06 DIAGNOSIS — N189 Chronic kidney disease, unspecified: Secondary | ICD-10-CM | POA: Insufficient documentation

## 2018-06-06 DIAGNOSIS — J90 Pleural effusion, not elsewhere classified: Secondary | ICD-10-CM | POA: Diagnosis not present

## 2018-06-06 DIAGNOSIS — K5900 Constipation, unspecified: Secondary | ICD-10-CM | POA: Diagnosis not present

## 2018-06-06 DIAGNOSIS — Z7984 Long term (current) use of oral hypoglycemic drugs: Secondary | ICD-10-CM | POA: Insufficient documentation

## 2018-06-06 DIAGNOSIS — R000 Tachycardia, unspecified: Secondary | ICD-10-CM | POA: Diagnosis not present

## 2018-06-06 DIAGNOSIS — E1140 Type 2 diabetes mellitus with diabetic neuropathy, unspecified: Secondary | ICD-10-CM | POA: Diagnosis not present

## 2018-06-06 LAB — CBG MONITORING, ED: Glucose-Capillary: 110 mg/dL — ABNORMAL HIGH (ref 70–99)

## 2018-06-06 LAB — LIPASE, BLOOD: Lipase: 28 U/L (ref 11–51)

## 2018-06-06 MED ORDER — IOHEXOL 300 MG/ML  SOLN
100.0000 mL | Freq: Once | INTRAMUSCULAR | Status: AC | PRN
  Administered 2018-06-06: 100 mL via INTRAVENOUS

## 2018-06-06 MED ORDER — IOPAMIDOL (ISOVUE-300) INJECTION 61%
INTRAVENOUS | Status: AC
  Filled 2018-06-06: qty 100

## 2018-06-06 MED ORDER — HEPARIN SOD (PORK) LOCK FLUSH 100 UNIT/ML IV SOLN
500.0000 [IU] | Freq: Once | INTRAVENOUS | Status: AC
  Administered 2018-06-06: 500 [IU]
  Filled 2018-06-06: qty 5

## 2018-06-06 MED ORDER — DOXYCYCLINE HYCLATE 100 MG PO TABS
100.0000 mg | ORAL_TABLET | Freq: Once | ORAL | Status: AC
  Administered 2018-06-06: 100 mg via ORAL
  Filled 2018-06-06: qty 1

## 2018-06-06 MED ORDER — SODIUM CHLORIDE (PF) 0.9 % IJ SOLN
INTRAMUSCULAR | Status: AC
  Filled 2018-06-06: qty 50

## 2018-06-06 MED ORDER — DOXYCYCLINE HYCLATE 100 MG PO CAPS: 100.0000 mg | ORAL_CAPSULE | Freq: Two times a day (BID) | ORAL | 0 refills | Status: AC

## 2018-06-06 MED ORDER — FLEET ENEMA 7-19 GM/118ML RE ENEM
1.0000 | ENEMA | Freq: Once | RECTAL | Status: AC
  Administered 2018-06-06: 1 via RECTAL
  Filled 2018-06-06: qty 1

## 2018-06-06 MED ORDER — SODIUM CHLORIDE 0.9 % IV BOLUS
250.0000 mL | Freq: Once | INTRAVENOUS | Status: AC
  Administered 2018-06-06: 250 mL via INTRAVENOUS

## 2018-06-06 MED ORDER — SODIUM CHLORIDE 0.9 % IV BOLUS
500.0000 mL | Freq: Once | INTRAVENOUS | Status: AC
  Administered 2018-06-06: 500 mL via INTRAVENOUS

## 2018-06-06 NOTE — Progress Notes (Signed)
No charge note.  Reviewed chart and spoke with ER PA caring for Frank Larsen.  Current plan is for bowel cleanse, pain management and discharge to home with close follow up by [facility].    ER will call PMT if patient is admitted and consultation for symptoms is needed.  Florentina Jenny, PA-C Palliative Medicine [phone]

## 2018-06-06 NOTE — ED Provider Notes (Signed)
[facility] DEPT Provider Note   CSN: [id number] Arrival date & time: 06/06/18  1020     History   Chief Complaint Chief Complaint  Patient presents with  . Abdominal Pain    HPI Frank Larsen is a 73 y.o. male presenting for evaluation of nausea, vomiting, abdominal pain.  Patient states yesterday he took a morphine pill for the first time.  He had subsequent vomiting, states he was vomiting a black, bilious and foul material.  This resolved until his next morphine pill many hours later, during which she had another episode of vomiting.  Patient states she has not had a bowel movement in the past 7 days.  This is abnormal for him, although he does have a history of constipation.  He normally will go 2 to 3 days between bowel movements.  He has been taking mag citrate, Senokot, and Dulcolax without improvement of symptoms.  He reports a history of diabetes, no history of gastroparesis.  He had prostate cancer 7 years ago which was resected, and then radiated.  However, he had seeding to the omentum, and this has recently spread to his liver.  He was taken off his chemotherapy yesterday, with plans to enroll with hospice.  He denies fevers, chills, chest pain, shortness of breath, abnormal urination. Patient states he is still having his chronic abdominal pain, this is unchanged from baseline.  Pain was worse when vomiting, but stable when not vomiting.  Additional history obtained from chart review, labs (CBC with diff and CMP) from yesterday reviewed. Pt with leukocytosis at 11.4, up from baseline. Worsening SCr.   HPI  Past Medical History:  Diagnosis Date  . AK (actinic keratosis)   . Arthritis   . BPH (benign prostatic hyperplasia)   . Chronic low back pain    since 1979  . Diabetes mellitus ORAL MED   type 2  . Elevated PSA   . GERD (gastroesophageal reflux disease)   . Hearing loss    using bilateral hearing aids  . History of radiation  therapy 01/11/14- 03/04/14   prostate bed 6600 cGy 33 sessions  . Hypercholesterolemia   . Hypercholesterolemia   . Hyperlipidemia   . Impaired hearing BILATERAL HEARING AIDS   20% RIGHT HEARING DUE TO VIRUS  . Melanoma ([place])   . Normal cardiac stress test 2010  . Prostate cancer ([place]) 07/20/11   Gleason 9  . Prostate cancer ([place])   . Prostate nodule   . Skin cancer    malignant melanoma of skin   . Tinnitus    stable since 1992  . Tobacco use     Patient Active Problem List   Diagnosis Date Noted  . Poorly controlled type 2 diabetes mellitus with peripheral neuropathy ([place]) 05/10/2018  . Protein-calorie malnutrition, severe 10/25/2017  . Hypokalemia 10/24/2017  . Hypomagnesemia 10/24/2017  . Acute kidney injury superimposed on CKD ([place]) 10/24/2017  . Ileus ([place]) 10/24/2017  . Pancytopenia ([place]) 10/24/2017  . Antineoplastic chemotherapy induced anemia 10/24/2017  . Colitis 10/23/2017  . Goals of care, counseling/discussion 11/16/2016  . Port catheter in place 03/30/2016  . Hypercholesterolemia   . BPH (benign prostatic hyperplasia)   . Prostate CA ([place]) 08/17/2011    Past Surgical History:  Procedure Laterality Date  . APPENDECTOMY  AGE 73  . CARPOMETACARPAL JOINT ARTHROTOMY  03-22-2006   RIGHT THUMB  . CARPOMETACARPAL JOINT ARTHROTOMY  12-21-2005   LEFT THUMB  . IR GENERIC HISTORICAL  03/22/2016  IR US GUIDE VASC ACCESS RIGHT [facility]  . IR GENERIC HISTORICAL  03/22/2016   IR FLUORO GUIDE PORT INSERTION RIGHT [facility]  . MELANOMA EXCISION  07-12-10   RIGHT NECK  . PROSTATE BIOPSY  07/20/2011   Procedure: BIOPSY TRANSRECTAL ULTRASONIC PROSTATE (TUBP);  Surgeon: Molli Hazard, MD;  [facility];  Service: Urology;  Laterality: N/A;  1 hour requested for this case  Saturation BX  OFFICE TO BRING ULTRASOUND MACHINE    . ROBOT ASSISTED LAPAROSCOPIC RADICAL PROSTATECTOMY  10/19/2011   Procedure: ROBOTIC ASSISTED LAPAROSCOPIC  RADICAL PROSTATECTOMY;  Surgeon: Molli Hazard, MD;  Location: [facility];  Service: Urology;  Laterality: N/A;      . SHOULDER OPEN ROTATOR CUFF REPAIR  07-29-2010- RIGHT   AND SUBACROMIAL DECOMPRESSION AROMIOPLASTY  . TONSILLECTOMY  AGE 4        Home Medications    Prior to Admission medications   Medication Sig Start Date End Date Taking? Authorizing Provider  atorvastatin (LIPITOR) 10 MG tablet Take 10 mg by mouth daily with breakfast.    Yes [provider]  calcium carbonate (TUMS - DOSED IN MG ELEMENTAL CALCIUM) 500 MG chewable tablet Chew 1 tablet (200 mg of elemental calcium total) by mouth 2 (two) times daily as needed for indigestion or heartburn. 10/26/17  Yes Lavina Hamman, MD  glipiZIDE (GLUCOTROL XL) 5 MG 24 hr tablet Take 2 tablets (10 mg total) by mouth daily with breakfast. 01/31/18  Yes Philemon Kingdom, MD  HYDROcodone-acetaminophen (NORCO/VICODIN) 5-325 MG tablet Take 1-2 tablets by mouth every 4 (four) hours as needed. 06/05/18  Yes Wyatt Portela, MD  hydrOXYzine (ATARAX/VISTARIL) 10 MG tablet TAKE 1 TABLET BY MOUTH EVERY 6 HOURS AS NEEDED 05/21/18  Yes Wyatt Portela, MD  JANUVIA 100 MG tablet Take 1 tablet by mouth daily 05/11/18  Yes Philemon Kingdom, MD  lidocaine-prilocaine (EMLA) cream Apply 1 application topically as needed. Apply to port before chemotherapy. 01/23/17  Yes Wyatt Portela, MD  magnesium citrate SOLN Take 1 Bottle by mouth once.   Yes [provider]  morphine (MS CONTIN) 30 MG 12 hr tablet Take 1 tablet (30 mg total) by mouth every 12 (twelve) hours. 06/05/18  Yes Wyatt Portela, MD  Multiple Vitamin (MULTIVITAMIN) tablet Take 1 tablet by mouth daily.   Yes [provider]  naproxen sodium (ALEVE) 220 MG tablet Take 440 mg by mouth daily as needed (pain).    Yes [provider]  omeprazole (PRILOSEC) 20 MG capsule Take 20 mg by mouth every morning.   Yes [provider]    pseudoephedrine-acetaminophen (TYLENOL SINUS) 30-500 MG TABS tablet Take 1 tablet by mouth daily as needed (congestion).    Yes [provider]  senna-docusate (SENNA S) 8.6-50 MG tablet Take 1 tablet by mouth 2 (two) times daily. 06/05/18  Yes Wyatt Portela, MD  Blood Glucose Monitoring Suppl (ONE TOUCH ULTRA 2) w/Device KIT Use to check blood sugar 2 times per day. 10/30/15   Elayne Snare, MD  glucose blood (ONE TOUCH ULTRA TEST) test strip Use to check blood sugar 2 times per day. 01/25/16   Philemon Kingdom, MD  [facility] DELICA LANCETS 93O MISC Use to check blood sugar 2 times per day. 01/27/16   Philemon Kingdom, MD  prochlorperazine (COMPAZINE) 10 MG tablet Take 1 tablet (10 mg total) by mouth every 6 (six) hours as needed for nausea or vomiting. Patient not taking: Reported on 06/06/2018  04/17/18   Wyatt Portela, MD    Family History Family History  Problem Relation Age of Onset  . Cancer Mother        breast/lived 42 years post  . Alzheimer's disease Mother        deceased  . Cancer Brother        thyroid  . Heart disease Brother   . Stroke Father        1 years old  . Hypertension Father   . Alcoholism Father   . CVA Father   . Cirrhosis Paternal Grandfather        alcohol-related  . Cancer Paternal Grandmother        unknown type     Social History Social History   Tobacco Use  . Smoking status: Former Smoker    Packs/day: 1.00    Years: 43.00    Pack years: 43.00    Types: Cigarettes    Last attempt to quit: 12/07/2007    Years since quitting: 10.5  . Smokeless tobacco: Never Used  Substance Use Topics  . Alcohol use: Yes    Comment: OCCASIONAL  . Drug use: No     Allergies   Scallops [shellfish allergy] and Cephalexin   Review of Systems Review of Systems  Gastrointestinal: Positive for abdominal pain, nausea and vomiting.  All other systems reviewed and are negative.    Physical Exam Updated Vital Signs BP 134/85 (BP Location:  Right Arm)   Pulse (!) 105   Temp 98 F (36.7 C) (Oral)   Resp (!) 22   SpO2 95%   Physical Exam Vitals signs and nursing note reviewed.  Constitutional:      Appearance: He is ill-appearing.     Comments: Sallow, chronically ill-appearing  HENT:     Head: Normocephalic and atraumatic.     Comments: MM dry and pale Eyes:     Conjunctiva/sclera: Conjunctivae normal.     Pupils: Pupils are equal, round, and reactive to light.  Neck:     Musculoskeletal: Normal range of motion and neck supple.  Cardiovascular:     Rate and Rhythm: Normal rate and regular rhythm.  Pulmonary:     Effort: Pulmonary effort is normal. No respiratory distress.     Breath sounds: Normal breath sounds. No wheezing.  Abdominal:     General: There is distension.     Tenderness: There is abdominal tenderness.     Comments: Abdomen distended and generally tender.  No masses.  Musculoskeletal: Normal range of motion.  Skin:    General: Skin is warm and dry.  Neurological:     Mental Status: He is alert and oriented to person, place, and time.      ED Treatments / Results  Labs (all labs ordered are listed, but only abnormal results are displayed) Labs Reviewed  LIPASE, BLOOD  CBG MONITORING, ED    EKG None  Radiology Dg Abd Acute W/chest  Result Date: 06/06/2018 CLINICAL DATA:  History of cancer. Abdominal pain. Constipation.Rule out obstruction. EXAM: DG ABDOMEN ACUTE W/ 1V CHEST COMPARISON:  CT 04/16/2018. FINDINGS: PowerPort catheter with tip over superior vena cava. Mediastinum and hilar structures normal. Heart size normal. Right mid lung field noted consistent with infiltrate. Air-filled loops of small bowel are noted. Relative paucity of colonic gas noted. Scattered air-fluid levels are noted. Small-bowel obstruction could present this fashion. Follow-up exam suggested demonstrate resolution. No free air. Pelvic calcifications consistent phleboliths. Surgical clip noted the pelvis. Soft  tissue nodular opacities noted over the lower chest bilaterally consistent with nipple shadows. No acute bony abnormality identified. IMPRESSION: 1.  PowerPort catheter noted with tip over superior vena cava. 2.  Right mid lung field infiltrate noted consistent with pneumonia. 3. Slightly distended loops of small bowel relative paucity of colonic gas. Small-bowel obstruction can not be excluded. Follow-up abdominal series suggested demonstrate resolution. CT of the abdomen pelvis can be obtained for further evaluation as well. Electronically Signed   By: Marcello Moores  Register   On: 06/06/2018 12:23    Procedures Procedures (including critical care time)  Medications Ordered in ED Medications  sodium chloride 0.9 % bolus 250 mL (250 mLs Intravenous New Bag/Given 06/06/18 1304)     Initial Impression / Assessment and Plan / ED Course  I have reviewed the triage vital signs and the nursing notes.  Pertinent labs & imaging results that were available during my care of the patient were reviewed by me and considered in my medical decision making (see chart for details).     Patient presenting for evaluation of constipation, nausea, vomiting, abdominal pain.  On exam, patient appears chronically ill and sallow.  Abdomen distended and generally tender.  Considering history, concern for bowel obstruction.  Labs yesterday mostly at baseline, mild leukocytosis and AKI.  Fluids given.  No lipase was ordered, will order lipase to rule out pancreatitis.  Lipase reassuring.  X-ray nonspecific for possible bowel obstruction.  Will obtain CT for further evaluation.  CT shows progression of cancer, no obstruction.  Moderate stool burden.  No intra-abdominal infection.  CT does show right mid lobe pneumonia, similar to x-ray.  Findings discussed with patient and wife.  Will attempt enema for symptom control.  Hospice met with patient, will arrange follow-up outpatient.  If patient needs to be admitted, palliative will  follow.  Pt signed out to [place], PA for f/u after enema. If not improved or pt does not feel he can go home, consider admission for pain control/comfrot with palliative consult.    Final Clinical Impressions(s) / ED Diagnoses   Final diagnoses:  Pneumonia of right middle lobe due to infectious organism [facility])  Constipation, unspecified constipation type  Malignant ascites    ED Discharge Orders    None       Franchot Heidelberg, PA-C 06/06/18 1606    Davonna Belling, MD 06/07/18 1501

## 2018-06-06 NOTE — ED Provider Notes (Signed)
Care assumed from [facility].  Please see her full H&P.  In short,  Frank Larsen is a 73 y.o. male with a PMH prostate adenocarcinoma presents for abdominal pain, nausea, vomiting, and constipation. Patient reports constipation for 7 days. Patient has agreed to proceed with hospice and cancel all chemotherapy.   Progressive metastatic disease in the abdomen with worsening hepatic metastases and development of moderate volume abdominopelvic ascites noted on CT abdomen. Negative CT abdomen for signs of bowel obstruction or significant dilatation. Patchy and nodular right upper lobe bronchovascular opacities noted on CT chest consistent with pneumonia. Frank Larsen has prescribed Doxycyline. Palliative care has been consulted. Patient was provided enema for constipation. Patient was able to have two bowel movements and reports significant improvement in his symptoms. Discussed admission vs discharge with patient and patient prefers to be discharged. Will discharge to home. Discussed return precautions with patient and wife. Patient will be discharged to home.      Darlin Drop [place], [place] 06/06/18 1829    Lacretia Leigh, MD 06/08/18 [phone]

## 2018-06-06 NOTE — ED Triage Notes (Signed)
Pt here for abdominal pain, nausea, emesis and constipation. Pt states he vomited "black bile" yesterday. Denies vomiting today. Pt has been taking morphine, which he believes is causing his nausea/vomiting. Pt has tried mag citrate and senna w/o relief. Pt's oncologist advised pt come to ED. Pt has been on chemotherapy, last time was 1/8. Pt spoke to oncologist about hospice and DNR status yesterday.

## 2018-06-06 NOTE — Progress Notes (Addendum)
[facility] [place])  Visited pt in the ED with his wife [name].  Pt was referred to [place] yesterday by Dr. Alen Blew, however pt did not want a visit yesterday, subsequently came to ED with abd pain, vomiting and seven days without bowel movement.    He is not currently under hospice services.    Spoke about next steps with pt and wife, all dependent on what imaging shows and what the options for treatment are.  Pt and wife are aware that time is limited, and they are wanting to focus on comfort and whatever independence he can maintain.    [facility] will continue to follow and anticipate his discharge to assist with care once ready for discharge.  Thank you, Venia Carbon BSN, [facility] Liaison (listed in [place]) [phone]

## 2018-06-06 NOTE — Telephone Encounter (Signed)
He needs to go to ED

## 2018-06-06 NOTE — Telephone Encounter (Signed)
Wife linda calling. states patient is in a lot of pain. Unable to have a BM. Has used mag citrate and 2 senna +, no results. Took morphine at 10:30 am yesterday and 10:00 pm last night and vomited black bile. She is unable to get him out of the bed. Just took 2 hydrocodone tablets.

## 2018-06-06 NOTE — Telephone Encounter (Signed)
Spoke with wife linda. Per dr Alen Blew, patient is to report to the E.R. wife verbalized understanding.

## 2018-06-06 NOTE — Discharge Instructions (Addendum)
Make sure you are staying well-hydrated water.  This is very important. Take antibiotics as prescribed.  Take entire course, even if your symptoms improve. Take all your constipation medications regularly, including mag citrate, Senokot, and Dulcolax. Continue using hydrocodone as needed for pain control. Follow-up with your cancer doctor regarding pain control. Follow-up with hospice regarding further comfort care and management. Return to the emergency room with any new, worsening, concerning symptoms.

## 2018-06-06 NOTE — Progress Notes (Signed)
Palliative Medicine RN Note: Consult noted for symptoms & support. Spoke with Sophia (PA in ED). Patient was referred to [facility] by [facility] yesterday but ended up in ED before [facility] could see him. He is still being worked up, and it's unclear at this time if he will be admitted.  If Mr Polyak is admitted, [facility] will be happy to help with symptom management. If he is discharged from the ED, please place a consult for hospice in the d/c orders (it will be a social work for residential hospice placement/[facility], or an Therapist, sports case management order for home hospice).   By chart review, if Mr Scholze would be interested in residential hospice, he sounds like he would qualify, but [facility] would need to evaluate him to determine eligibility.  I spoke with the [facility] liaison Anderson Malta, who is in [place] under [place]. She will go evaluate Mr Kennerly in the ED & update the ED team on eligibility status and what hospice options are today.  Marjie Skiff Tanush Drees, RN, BSN, [facility] Palliative Medicine Team 06/06/2018 1:22 PM Office [phone]

## 2018-06-07 ENCOUNTER — Telehealth: Payer: Self-pay

## 2018-06-07 NOTE — Telephone Encounter (Signed)
Per 1/28 no los 

## 2018-06-11 ENCOUNTER — Telehealth: Payer: Self-pay

## 2018-06-11 DIAGNOSIS — C61 Malignant neoplasm of prostate: Secondary | ICD-10-CM | POA: Diagnosis not present

## 2018-06-11 NOTE — Telephone Encounter (Signed)
Received call from Brandon Melnick RN with hosp[facility] of [place] with an update on the patient status. She stated that the patient is bed bound, bites and sips only, constipated, nausea, heart burn, pain rated at a 9. Pain medication changed to 60 mg morphine (MS contin) BID, omeprazole dose doubled, all non-essential medications are being stopped, dyspneic at rest, and increased abdominal distention and refusing a hospital bed at this time.

## 2018-06-12 ENCOUNTER — Other Ambulatory Visit: Payer: PPO

## 2018-06-12 ENCOUNTER — Ambulatory Visit (HOSPITAL_COMMUNITY): Payer: PPO

## 2018-06-14 ENCOUNTER — Ambulatory Visit: Payer: PPO | Admitting: Oncology

## 2018-06-26 ENCOUNTER — Ambulatory Visit: Payer: PPO | Admitting: Oncology

## 2018-06-26 ENCOUNTER — Other Ambulatory Visit: Payer: PPO

## 2018-06-26 ENCOUNTER — Ambulatory Visit: Payer: PPO

## 2018-06-28 ENCOUNTER — Encounter: Payer: Self-pay | Admitting: Oncology

## 2018-07-08 DEATH — deceased

## 2018-09-11 ENCOUNTER — Ambulatory Visit: Payer: PPO | Admitting: Internal Medicine

## 2019-05-03 IMAGING — CT CT CHEST W/ CM
2 of 5 series · 12 of 36 positions shown, 15 images · IV contrast (APPLIED)
Comparison: CT the chest, abdomen and pelvis 06/28/2016.

CLINICAL DATA: 71-year-old male with history prostate cancer
diagnosed in 8037. Peritoneal disease diagnosed in 2383. Status post
prostatectomy. Undergoing ongoing chemotherapy.

EXAM:
CT CHEST, ABDOMEN, AND PELVIS WITH CONTRAST
TECHNIQUE: Multidetector CT imaging of the chest, abdomen and pelvis was
performed following the standard protocol during bolus
administration of intravenous contrast.
CONTRAST:  100mL DBHWJI-B77 IOPAMIDOL ([id number]-B77) INJECTION 61%

[Series 5: coronals · coronal · 0.82mm/px · 3 of 162 slices shown]
[im 33/162  lung]
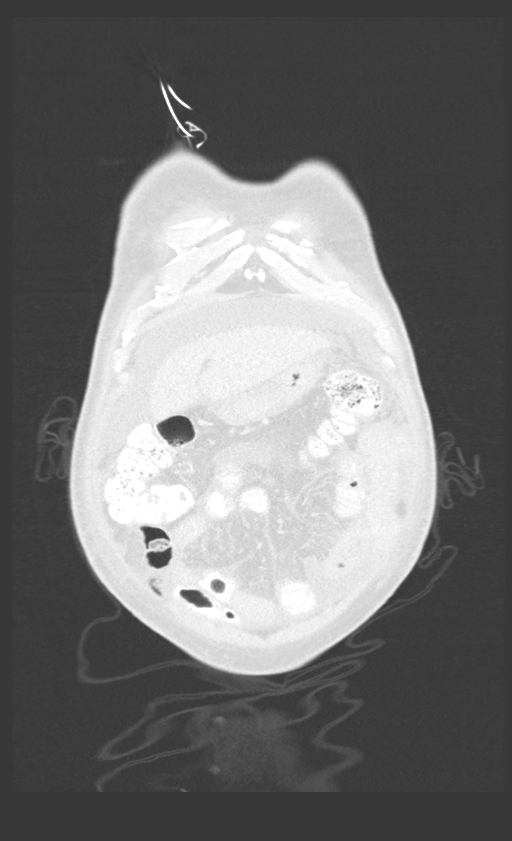
[im 65/162  lung]
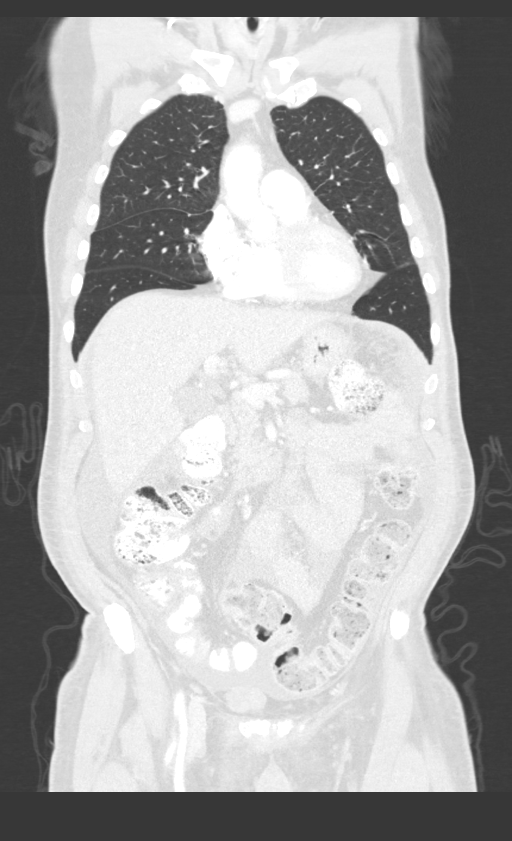
[im 97/162  lung]
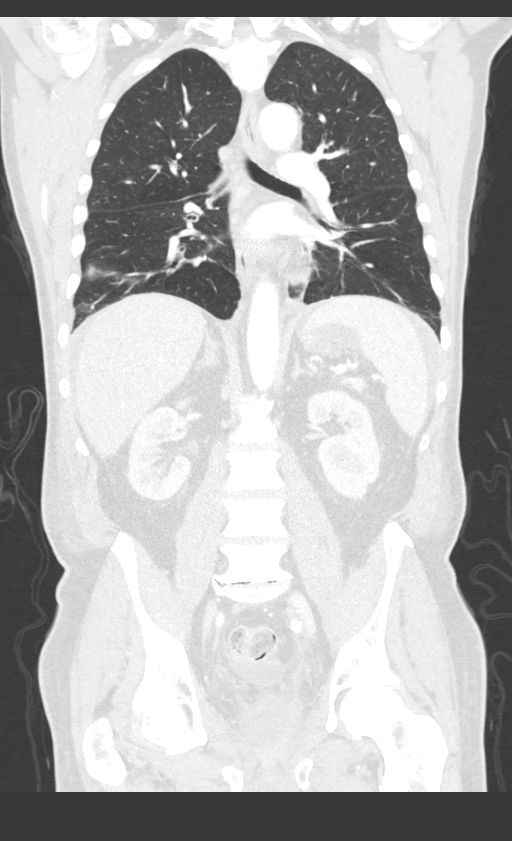

[Series 7: cap with · axial · 0.80mm/px · z∈[+1083,+1593]mm · 9 of 128 slices shown, 12 images]
[im 13/128  mediastinal]
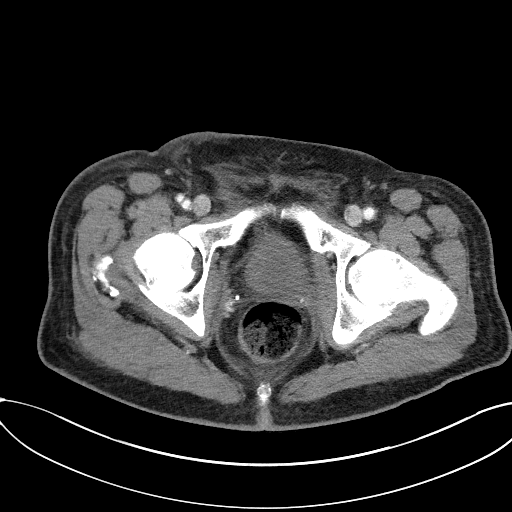
[im 13/128  lung]
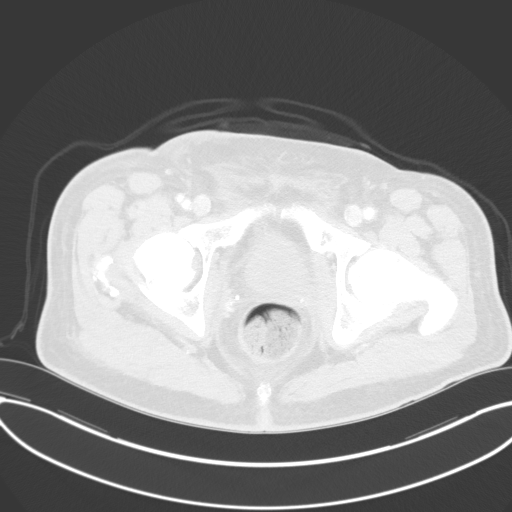
[im 26/128  lung]
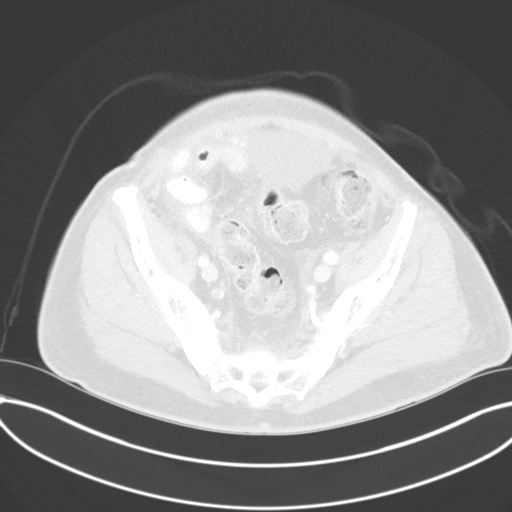
[im 39/128  lung]
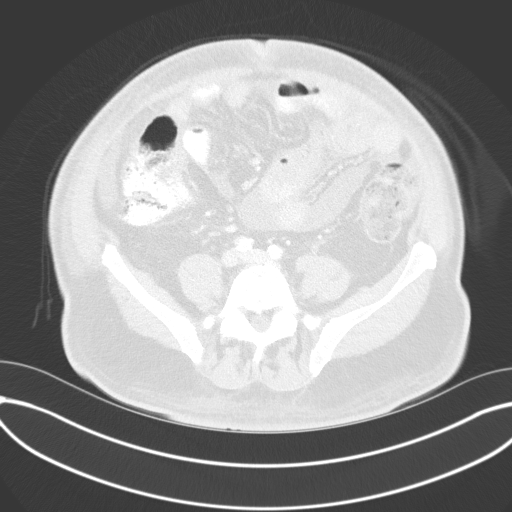
[im 51/128  lung]
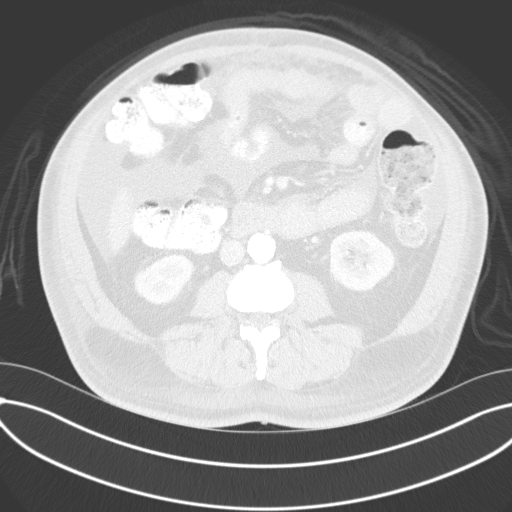
[im 64/128  mediastinal]
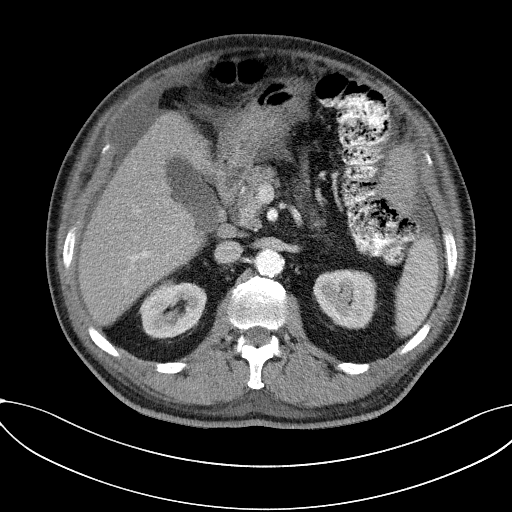
[im 64/128  lung]
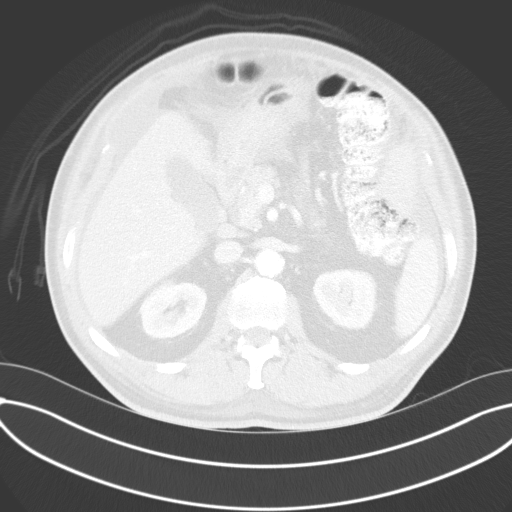
[im 77/128  lung]
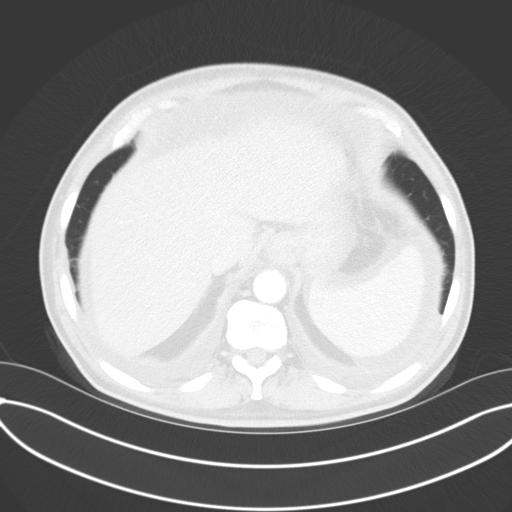
[im 89/128  lung]
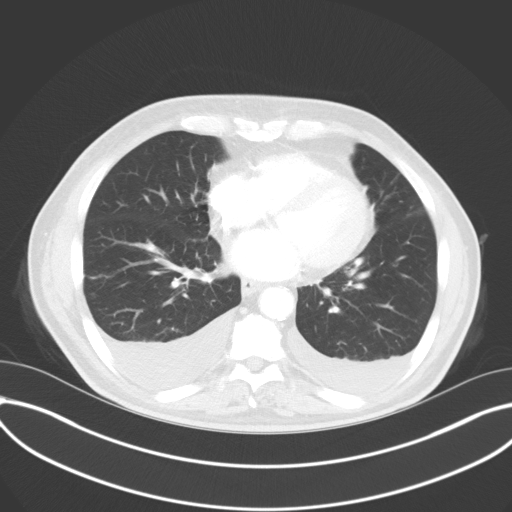
[im 102/128  lung]
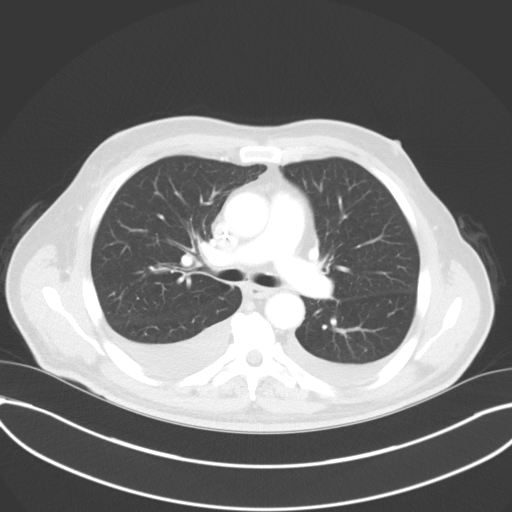
[im 115/128  mediastinal]
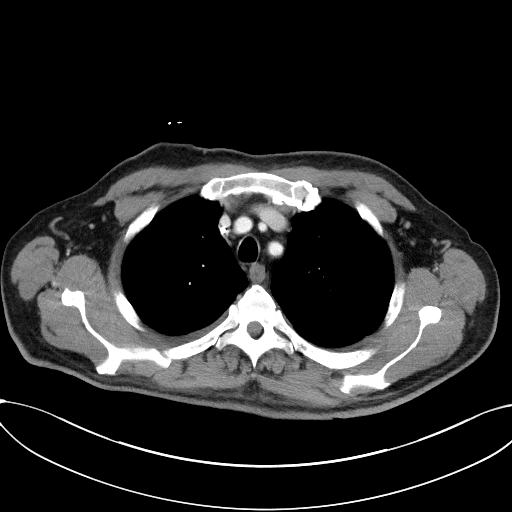
[im 115/128  lung]
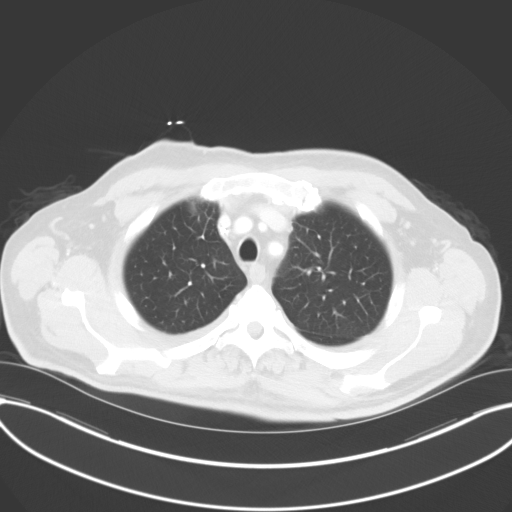

[12 of 36 positions shown; findings below may reference images not displayed]

FINDINGS: CT CHEST FINDINGS

Cardiovascular: Heart size is normal. There is no significant
pericardial fluid, thickening or pericardial calcification. There is
aortic atherosclerosis, as well as atherosclerosis of the great
vessels of the mediastinum and the coronary arteries, including
calcified atherosclerotic plaque in the left anterior descending and
right coronary arteries. Right internal jugular single-lumen porta
cath with tip terminating at the superior cavoatrial junction.

Mediastinum/Nodes: No pathologically enlarged mediastinal or hilar
lymph nodes. Esophagus is unremarkable in appearance. No axillary
lymphadenopathy. Enlarged juxta phrenic lymph nodes bilaterally
measuring up 11 mm on the left and 10 mm on the right (axial image
48 of series 7).

Lungs/Pleura: New moderate bilateral pleural effusions (right
greater than left) with associated passive subsegmental atelectasis
in the lower lobes of the lungs bilaterally. No acute consolidative
airspace disease. No suspicious appearing pulmonary nodules or
masses.

Musculoskeletal: Soft tissue anchors in the right humeral head from
prior rotator cuff surgery. Expansile sclerotic lesion in the
lateral aspect of the right eighth rib appears similar to the prior
examination (axial image 105 of series 4).

CT ABDOMEN PELVIS FINDINGS

Hepatobiliary: Subcentimeter low-attenuation lesion in segment 2 of
the liver is too small to characterize, but similar to prior
studies, likely a cyst. No other suspicious hepatic lesions are
noted. No intra or extrahepatic biliary ductal dilatation.
Gallbladder is normal in appearance.

Pancreas: No pancreatic mass. No pancreatic ductal dilatation. No
pancreatic or peripancreatic fluid or inflammatory changes.

Spleen: Unremarkable.

Adrenals/Urinary Tract: Bilateral kidneys and bilateral adrenal
glands are normal in appearance. No hydroureteronephrosis. Urinary
bladder is nearly decompressed. Wall of the urinary bladder appears
mildly thickened, which could be related to under distention.

Stomach/Bowel: The appearance of the stomach is unremarkable. No
pathologic dilatation of small bowel or colon. Appendix is not
confidently identified and may be surgically absent.

Vascular/Lymphatic: Aortic atherosclerosis, without evidence of
aneurysm or dissection in the abdominal or pelvic vasculature.
Multiple borderline enlarged retroperitoneal lymph nodes are noted,
which are nonspecific. Borderline enlarged portacaval lymph node
measuring 11 mm in short axis.

Reproductive: Status post prostatectomy.

Other: Small a moderate volume of ascites, increased compared to the
prior study. Several areas of mild peritoneal enhancement and
nodularity are noted, most evident in the left upper quadrant
anterior to the spleen where there is a a 7 mm enhancing nodule
(axial image 54 of series 7), and in the low anterior anatomic
pelvis where the largest soft tissue implant is measuring 2.3 x
cm (axial image 113 of series 7), which has significantly increased
compared to the prior study. Soft tissue stranding throughout the
omentum.

Musculoskeletal: A few scattered sclerotic lesions are noted
throughout the visualized axial and appendicular skeleton,
suspicious for metastatic disease to the bones. The largest of these
is in the right ilium measuring 9 mm (axial image 91 of series 7),
similar to prior examinations.
IMPRESSION: 1. Progression of metastatic disease as evidenced by worsening
intraperitoneal nodularity, increasing malignant ascites, increasing
juxta phrenic lymphadenopathy, and new moderate bilateral pleural
effusions. Metastatic disease to the bones appears similar to the
prior study, as above.
2. Aortic atherosclerosis, in addition to two vessel coronary artery
disease. Assessment for potential risk factor modification, dietary
therapy or pharmacologic therapy may be warranted, if clinically
indicated.
3. Additional incidental findings, as above.
